# Patient Record
Sex: Female | Born: 1960
Health system: Southern US, Community
[De-identification: ages and names within clinical notes are randomized; demographics above are authoritative.]

## PROBLEM LIST (undated history)

## (undated) DIAGNOSIS — M8580 Other specified disorders of bone density and structure, unspecified site: Secondary | ICD-10-CM

## (undated) DIAGNOSIS — Z9221 Personal history of antineoplastic chemotherapy: Secondary | ICD-10-CM

## (undated) DIAGNOSIS — Z9889 Other specified postprocedural states: Secondary | ICD-10-CM

## (undated) DIAGNOSIS — C801 Malignant (primary) neoplasm, unspecified: Secondary | ICD-10-CM

## (undated) DIAGNOSIS — Z923 Personal history of irradiation: Secondary | ICD-10-CM

## (undated) DIAGNOSIS — C50919 Malignant neoplasm of unspecified site of unspecified female breast: Secondary | ICD-10-CM

## (undated) DIAGNOSIS — R112 Nausea with vomiting, unspecified: Secondary | ICD-10-CM

## (undated) DIAGNOSIS — E785 Hyperlipidemia, unspecified: Secondary | ICD-10-CM

## (undated) HISTORY — DX: Malignant (primary) neoplasm, unspecified: C80.1

## (undated) HISTORY — PX: TONSILLECTOMY: SUR1361

## (undated) HISTORY — DX: Hyperlipidemia, unspecified: E78.5

## (undated) HISTORY — PX: BREAST SURGERY: SHX581

---

## 2001-09-29 IMAGING — MG UNKNOWN MG STUDY
1 series · 8 of 8 positions shown · non-contrast
Comparison: none

REASON FOR EXAM: scr..hm[PHONE_NUMBER] cat 2

[Series 1708: R CC · right · 8 of 8 slices shown]
[im 1/8]
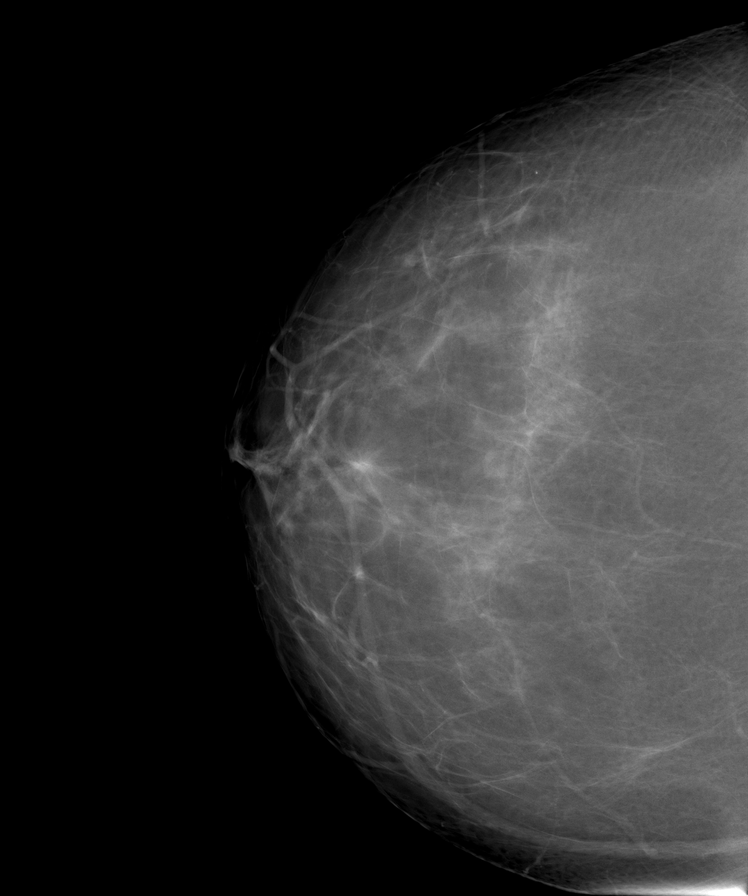
[im 2/8]
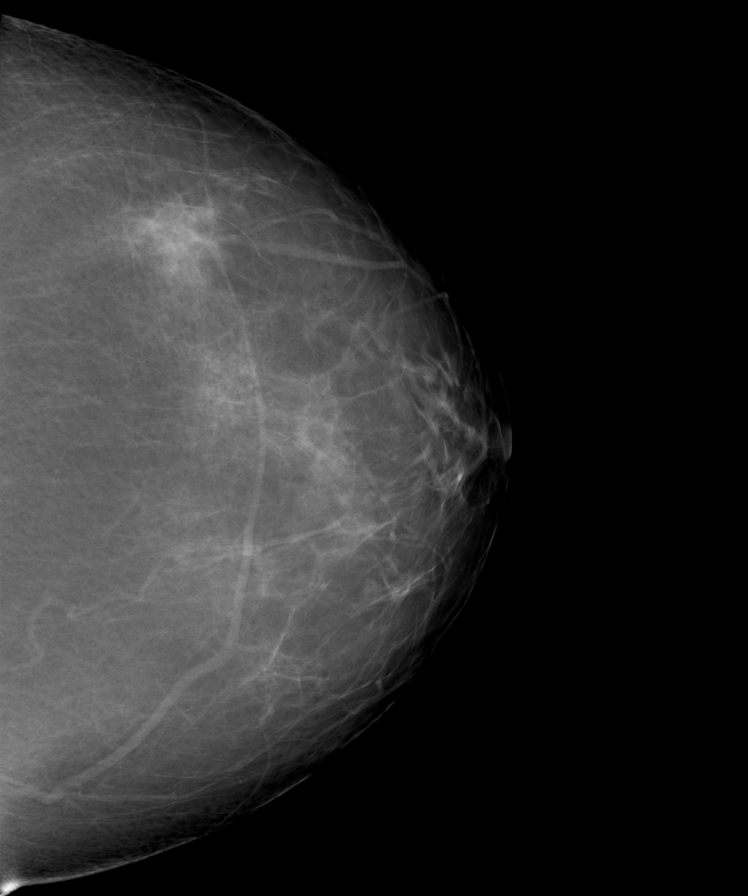
[im 3/8]
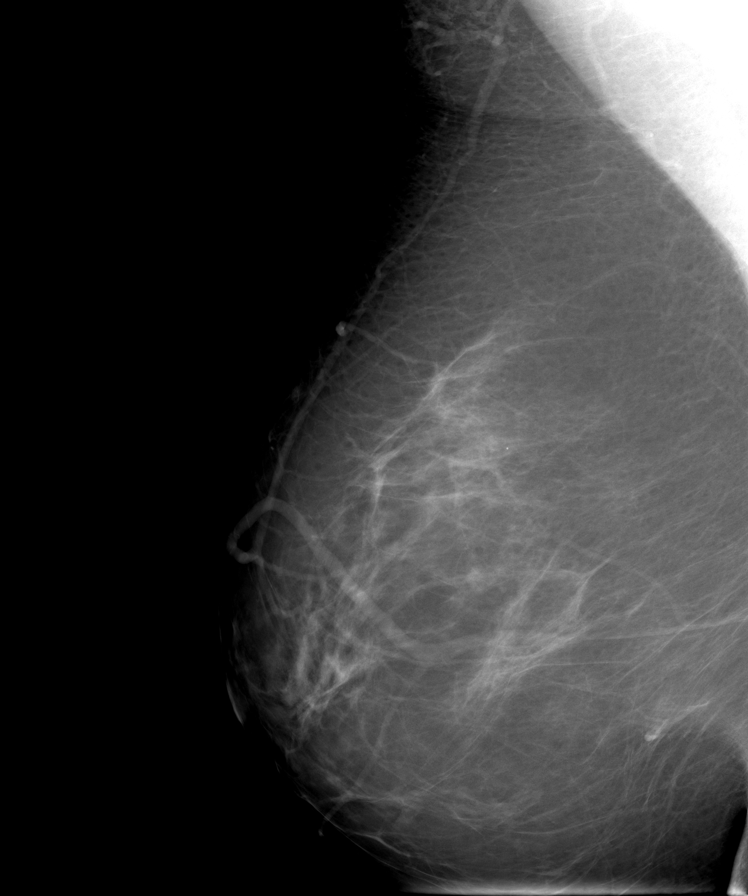
[im 4/8]
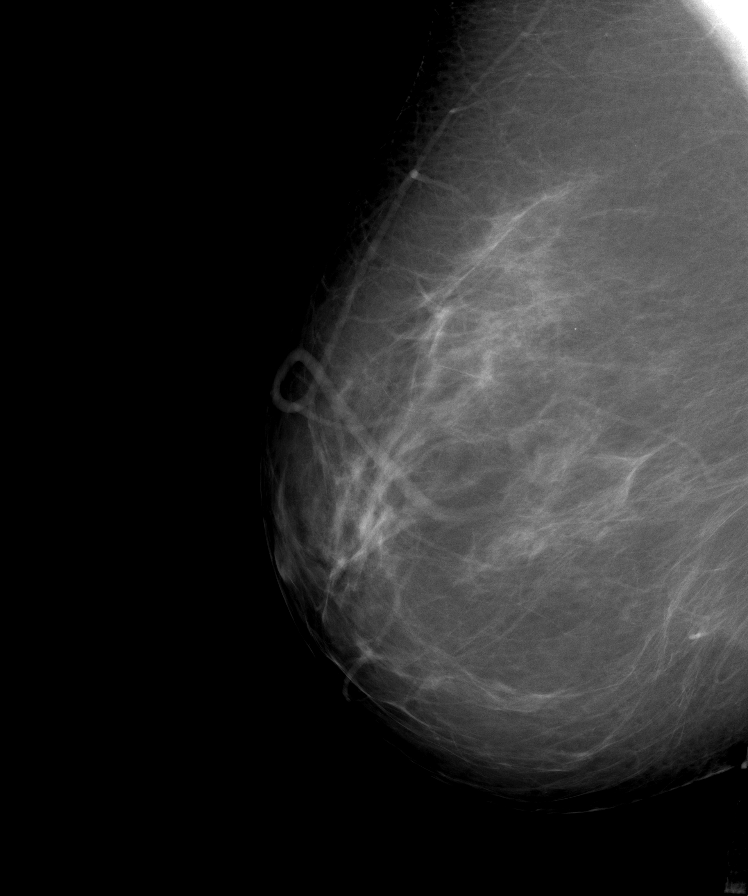
[im 5/8]
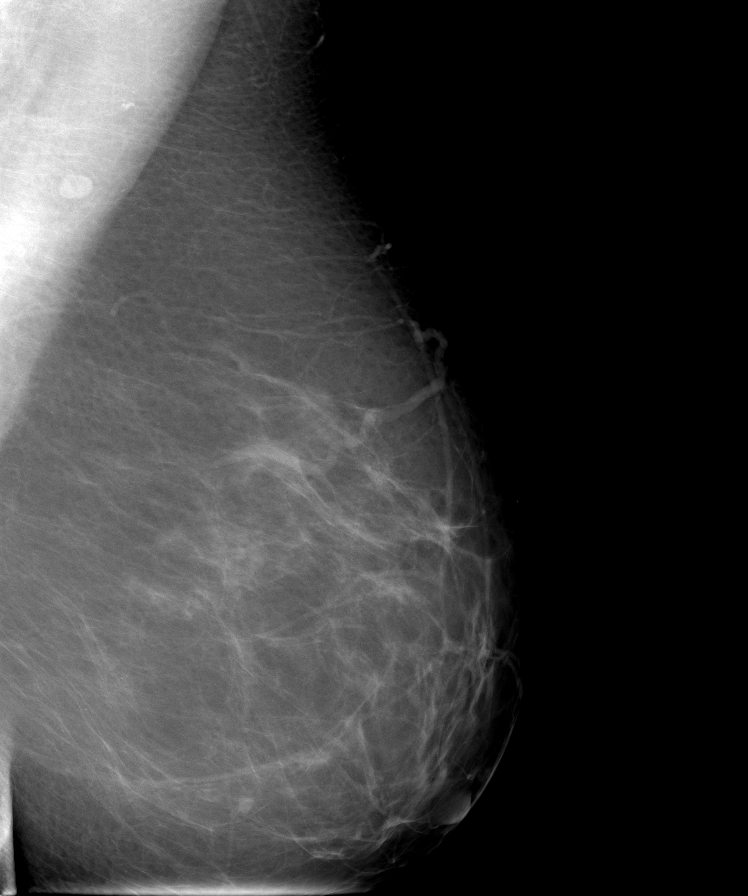
[im 6/8]
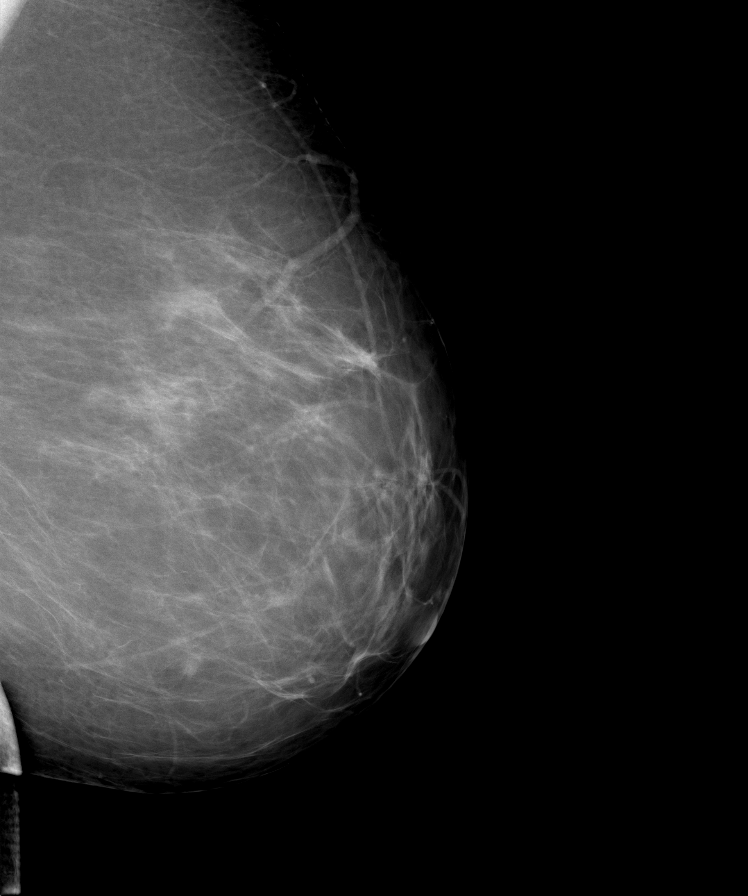
[im 7/8]
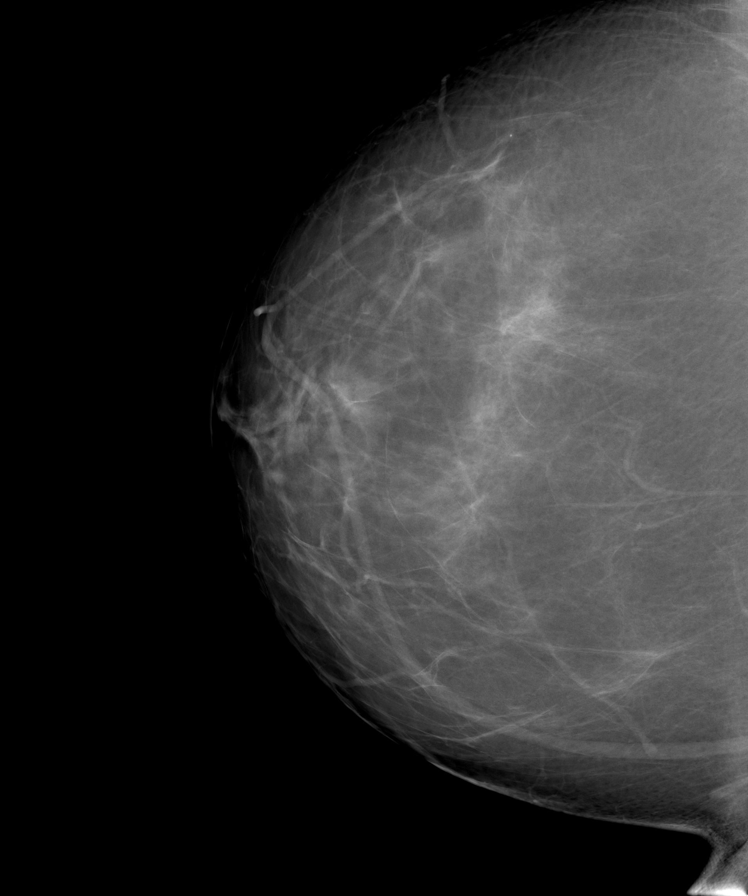
[im 8/8]
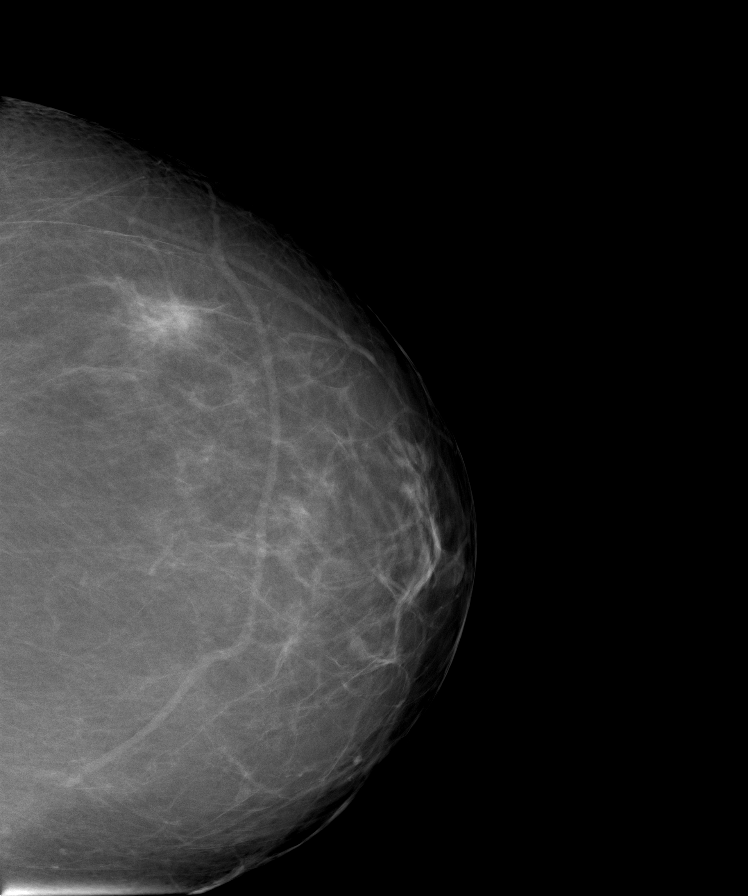

[8 of 8 positions shown; findings below may reference images not displayed]

Procedure: DIGITAL BILATERAL SCREENING MAMMOGRAPHY WITH CAD

 Comparison is made to study [DATE] and [DATE].

 The breasts exhibit a moderately dense parenchymal pattern. There is no
dominant mass. I see no malignant appearing grouping of microcalcification.
There is no area of new architectural distortion.
IMPRESSION: I see no finding suspicious for malignancy.
 BI-RADS: Category 2-Benign Findings.

 RECOMMENDATIONS: Please continue to encourage yearly mammographic follow
up.

 A NEGATIVE MAMMOGRAM REPORT DOES NOT PRECLUDE BIOPSY OR OTHER EVALUATION OF
A CLINICALLY PALPABLE OR OTHERWISE SUSPICIOUS MASS OR LESION. BREAST CANCER
MAY NOT BE DETECTED BY MAMMOGRAPHY IN UP TO 10% OF CASES.

## 2003-11-27 DIAGNOSIS — C50919 Malignant neoplasm of unspecified site of unspecified female breast: Secondary | ICD-10-CM

## 2003-11-27 HISTORY — PX: BREAST BIOPSY: SHX20

## 2003-11-27 HISTORY — DX: Malignant neoplasm of unspecified site of unspecified female breast: C50.919

## 2003-11-27 HISTORY — PX: BREAST LUMPECTOMY: SHX2

## 2003-11-27 HISTORY — PX: BREAST LUMPECTOMY WITH NEEDLE LOCALIZATION AND AXILLARY SENTINEL LYMPH NODE BX: SHX5760

## 2004-10-24 ENCOUNTER — Ambulatory Visit: Payer: Self-pay | Admitting: Internal Medicine

## 2004-10-30 ENCOUNTER — Inpatient Hospital Stay: Payer: Self-pay | Admitting: Surgery

## 2004-10-30 IMAGING — NM NM SENTINAL NODE INJECTION (BREAST) - NO REPORT
1 series · 5 of 5 positions shown · non-contrast
Comparison: none

REASON FOR EXAM: exc of RT breast mass with sentinel node bx  12-5  surgery
at 11am
COMMENTS:

[Series 0: breastsentinel · 1.9mm · 1.95mm/px · 5 of 5 frames shown]
[frame 1/5]
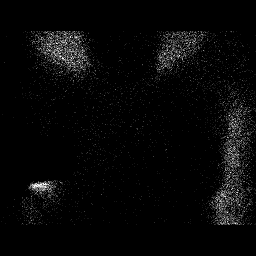
[frame 2/5]
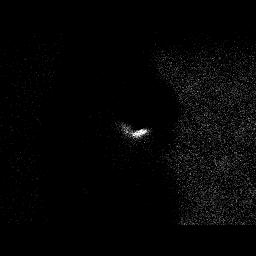
[frame 3/5]
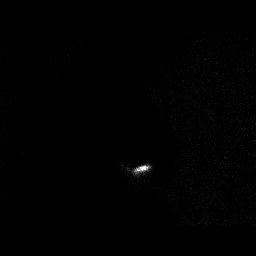
[frame 4/5]
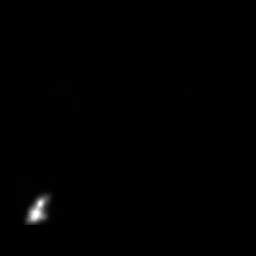
[frame 5/5]
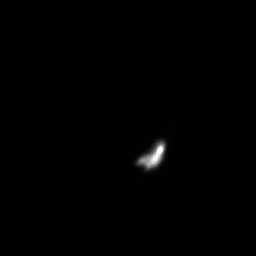

[5 of 5 positions shown; findings below may reference images not displayed]

PROCEDURE:     NM  - NM SENTINEL NODE  BREAST  - [DATE]  [DATE]

RESULT:     The patient has a palpable mass on the RIGHT in the upper outer
quadrant.  The anticipated procedure was discussed with Ms. DANELLE. The skin
was cleansed with an iodine solution in the upper outer periareolar region.
The subcutaneous tissues were infiltrated with approximately 2 ccs of 1%
lidocaine. Subsequently, a dose of 0.99 mCi of [ZL] sulfur colloid
was injected into the intradermal tissues.  Imaging was then carried [DATE] minutes. There is activity noted at the injection site.  No abnormal
activity is seen in the axillary region. There was slight increased activity
superiorly from the injection site in the periareolar region, but this did
not hold up on additional imaging.
IMPRESSION: 1)This is a sentinel node injection to evaluate the lymphatics on the RIGHT.
 No detectable abnormal activity was identified in the axilla.

## 2004-10-30 IMAGING — NM NM SENTINAL NODE INJECTION (BREAST) - NO REPORT
1 series · 6 of 6 positions shown · non-contrast
Comparison: none

REASON FOR EXAM: exc of RT breast mass with sentinel node bx  12-5  surgery
at 11am
COMMENTS:

[Series 0: sentinal node · 3.9mm · 3.90mm/px · 6 of 60 frames shown]
[frame 6/60  full-range]
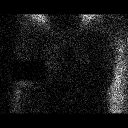
[frame 16/60  full-range]
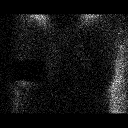
[frame 26/60  full-range]
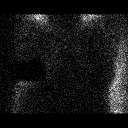
[frame 36/60  full-range]
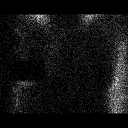
[frame 46/60  full-range]
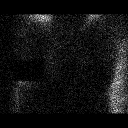
[frame 56/60  full-range]
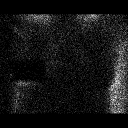

[6 of 6 positions shown; findings below may reference images not displayed]

PROCEDURE:     NM  - NM SENTINEL NODE  BREAST  - [DATE]  [DATE]

RESULT:     The patient has a palpable mass on the RIGHT in the upper outer
quadrant.  The anticipated procedure was discussed with Ms. DANELLE. The skin
was cleansed with an iodine solution in the upper outer periareolar region.
The subcutaneous tissues were infiltrated with approximately 2 ccs of 1%
lidocaine. Subsequently, a dose of 0.99 mCi of [ZL] sulfur colloid
was injected into the intradermal tissues.  Imaging was then carried [DATE] minutes. There is activity noted at the injection site.  No abnormal
activity is seen in the axillary region. There was slight increased activity
superiorly from the injection site in the periareolar region, but this did
not hold up on additional imaging.
IMPRESSION: 1)This is a sentinel node injection to evaluate the lymphatics on the RIGHT.
 No detectable abnormal activity was identified in the axilla.

## 2004-11-21 ENCOUNTER — Ambulatory Visit: Payer: Self-pay | Admitting: Internal Medicine

## 2004-11-23 ENCOUNTER — Other Ambulatory Visit: Payer: Self-pay

## 2004-11-26 ENCOUNTER — Ambulatory Visit: Payer: Self-pay | Admitting: Internal Medicine

## 2004-12-07 ENCOUNTER — Inpatient Hospital Stay: Payer: Self-pay | Admitting: Internal Medicine

## 2004-12-27 ENCOUNTER — Ambulatory Visit: Payer: Self-pay | Admitting: Internal Medicine

## 2005-01-24 ENCOUNTER — Ambulatory Visit: Payer: Self-pay | Admitting: Internal Medicine

## 2005-02-22 IMAGING — US US EXTREM LOW VENOUS BILAT
1 series · 17 of 24 positions shown · non-contrast
Comparison: none

REASON FOR EXAM: Bilateral leg swelling, evaluate DVT
COMMENTS:

[Series 1: us extrem low venous bilat · 17 of 54 slices shown]
[im 1/54]
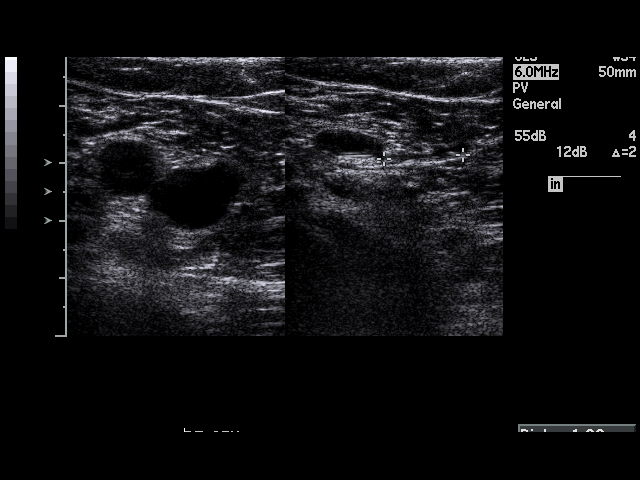
[im 5/54]
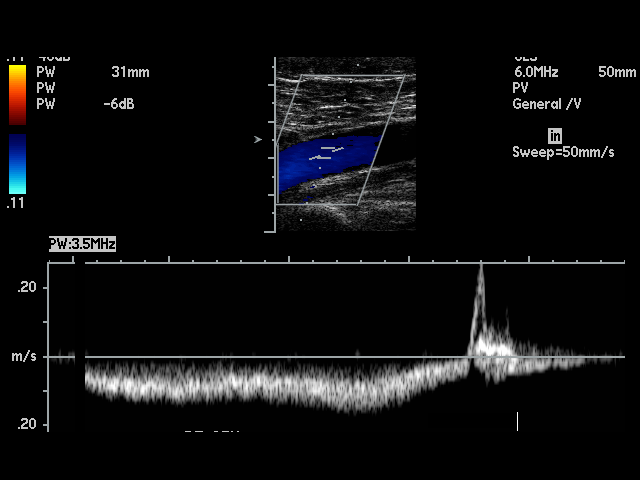
[im 7/54]
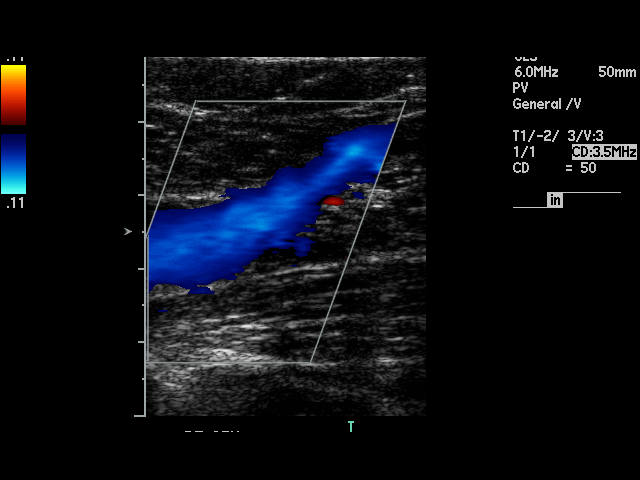
[im 10/54]
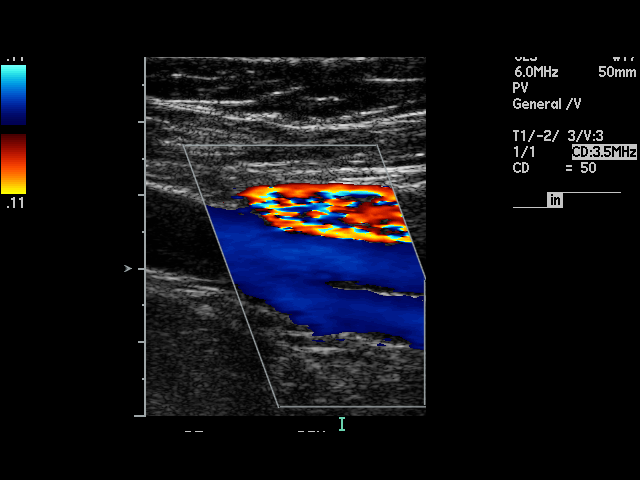
[im 14/54]
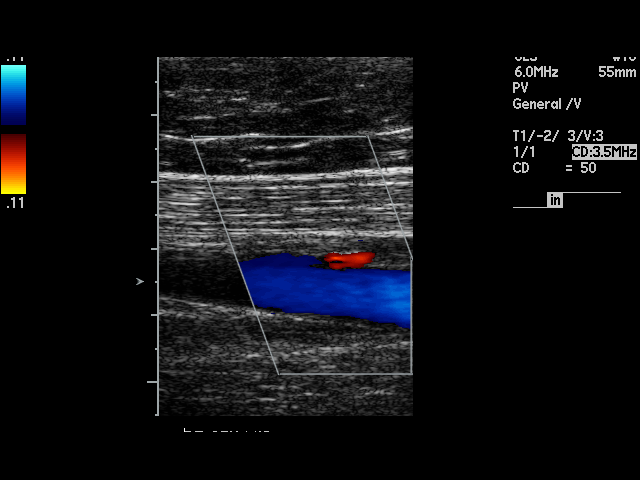
[im 17/54]
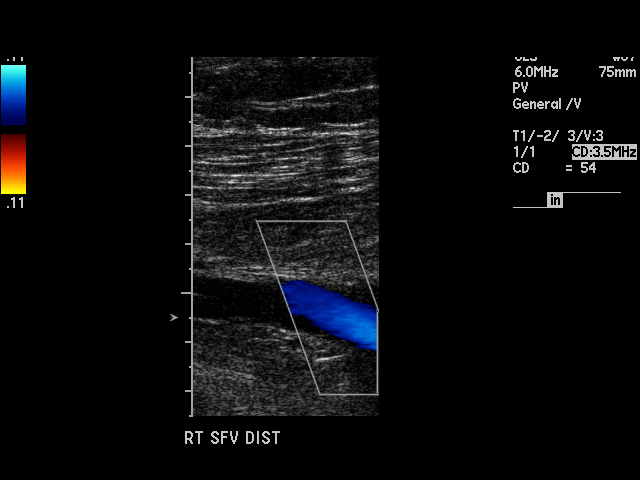
[im 21/54]
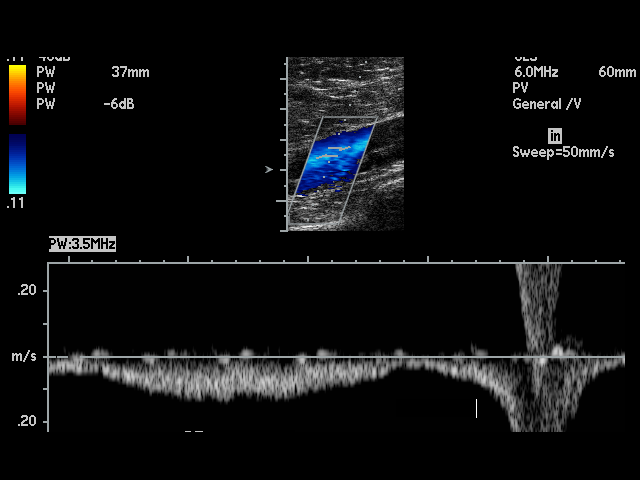
[im 24/54]
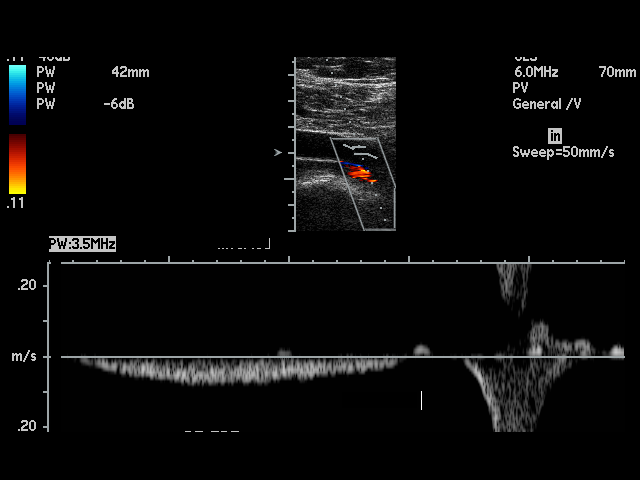
[im 28/54]
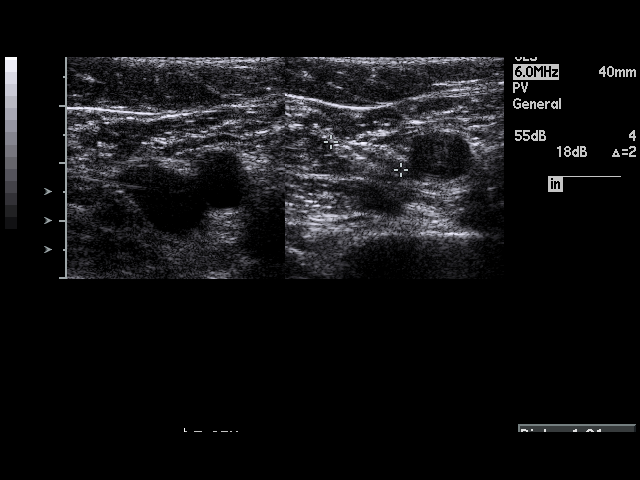
[im 30/54]
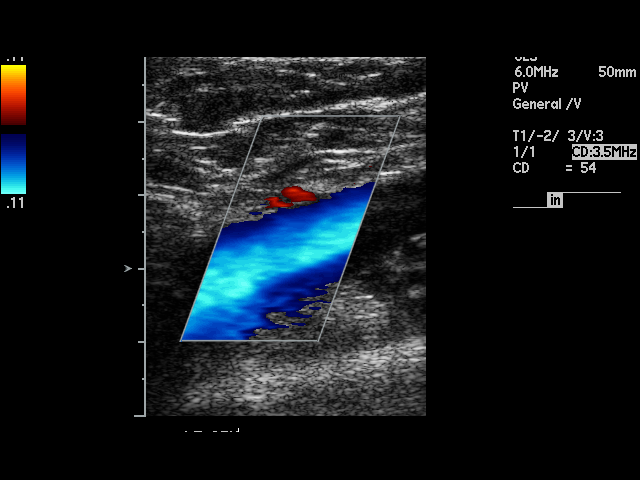
[im 33/54]
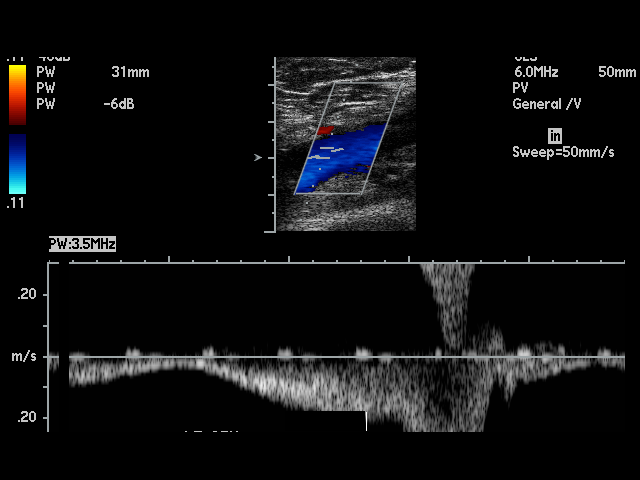
[im 37/54]
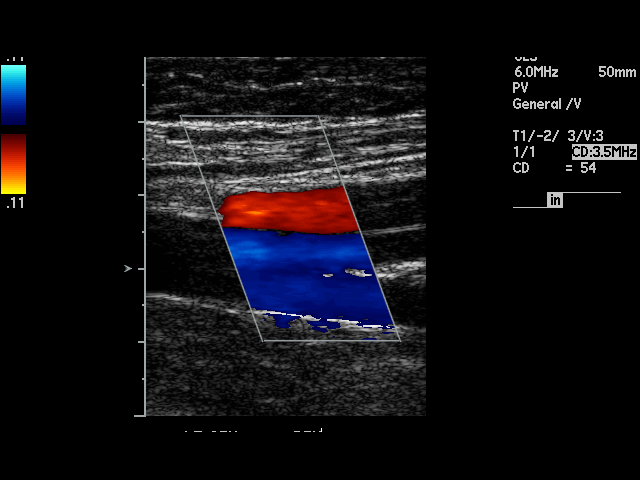
[im 40/54]
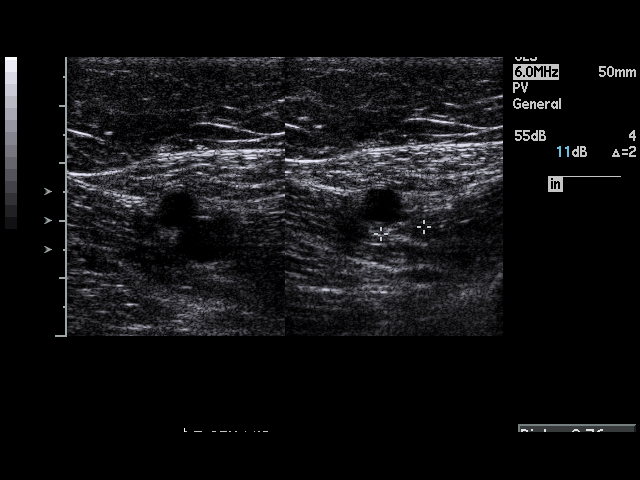
[im 44/54]
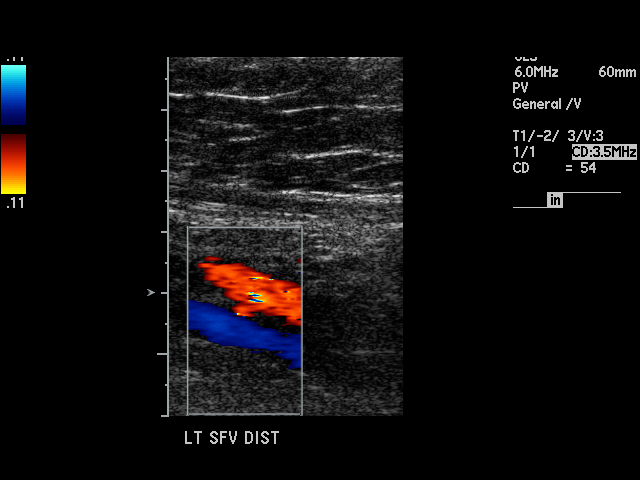
[im 47/54]
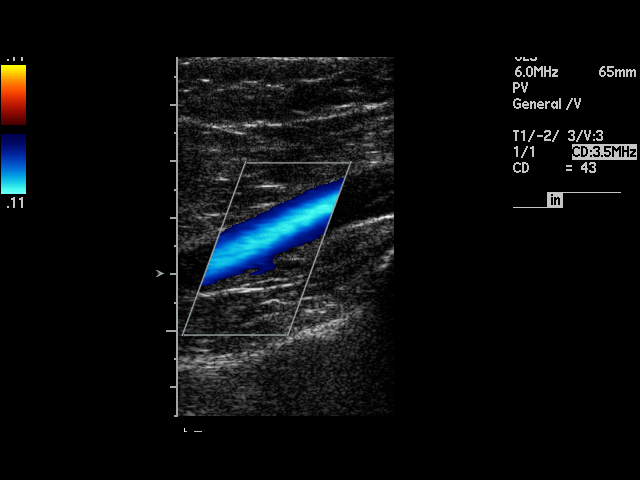
[im 49/54]
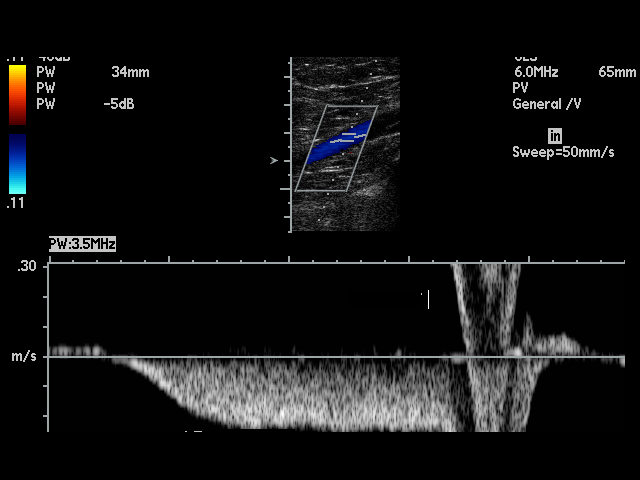
[im 54/54]
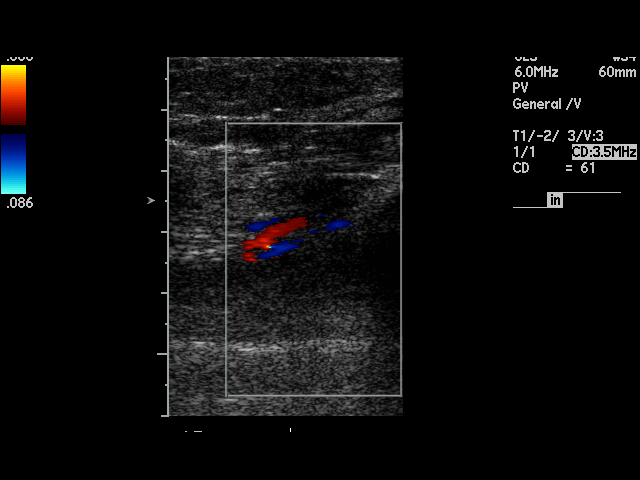

[17 of 24 positions shown; findings below may reference images not displayed]

PROCEDURE:     US  - US DOPPLER LOW EXTR BILATERAL  - [DATE]  [DATE]

RESULT:     Evaluation of the RIGHT and LEFT lower extremities demonstrates
no evidence of increased echogenicity and non-compressibility within the
deep venous structures of the RIGHT or LEFT lower extremity to suggest the
sequelae of a deep venous thrombus. There is appropriate response to
augmentation and Valsalva within the interrogated vessels of the RIGHT and
LEFT lower extremities. Appropriate color flow is demonstrated within the
interrogated vessels.
IMPRESSION: No evidence of a deep venous thrombus within the RIGHT or
LEFT lower extremity as described above.

## 2005-02-23 IMAGING — NM NM CARDIAC MUGA REST INJ 1 OF 2 - NRPT
7 series · 27 of 27 positions shown · non-contrast
Comparison: none

[Series 1: lao 45-gated (results) · 6.59mm/px · 6 of 24 frames shown]
[frame 3/24]
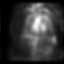
[frame 7/24]
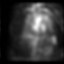
[frame 11/24]
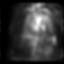
[frame 15/24]
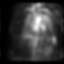
[frame 19/24]
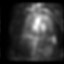
[frame 23/24]
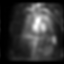

[Series 1: lao 45 · 6.59mm/px · 1 of 1 slices shown]
[im 1/1]
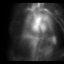

[Series 1: ant-gated · 6.59mm/px · 6 of 24 frames shown]
[frame 3/24]
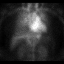
[frame 7/24]
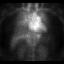
[frame 11/24]
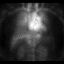
[frame 15/24]
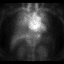
[frame 19/24]
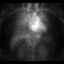
[frame 23/24]
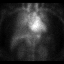

[Series 1: lao 70 · 6.59mm/px · 1 of 1 slices shown]
[im 1/1]
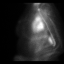

[Series 1: ant · 6.59mm/px · 1 of 1 slices shown]
[im 1/1]
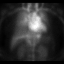

[Series 1: lao 45-gated · 6.59mm/px · 6 of 24 frames shown]
[frame 3/24]
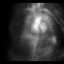
[frame 7/24]
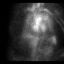
[frame 11/24]
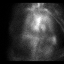
[frame 15/24]
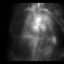
[frame 19/24]
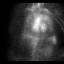
[frame 23/24]
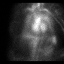

[Series 1: lao 70-gated · 6.59mm/px · 6 of 24 frames shown]
[frame 3/24]
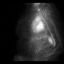
[frame 7/24]
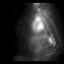
[frame 11/24]
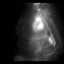
[frame 15/24]
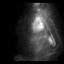
[frame 19/24]
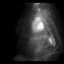
[frame 23/24]
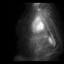

[27 of 27 positions shown; findings below may reference images not displayed]

IMAGES IMPORTED FROM THE SYNGO WORKFLOW SYSTEM
NO DICTATION FOR STUDY

## 2005-02-24 ENCOUNTER — Ambulatory Visit: Payer: Self-pay | Admitting: Internal Medicine

## 2005-03-26 ENCOUNTER — Ambulatory Visit: Payer: Self-pay | Admitting: Internal Medicine

## 2005-03-27 IMAGING — CT CT GUIDANCE PLACEMENT RAD THERAPY FIELDS
1 series · 16 of 32 positions shown, 20 images · non-contrast
Comparison: none

[Series 1: tx planning · axial · 0.98mm/px · z∈[-112,+168]mm · 16 of 123 slices shown, 20 images]
[im 8/123  soft-tissue]
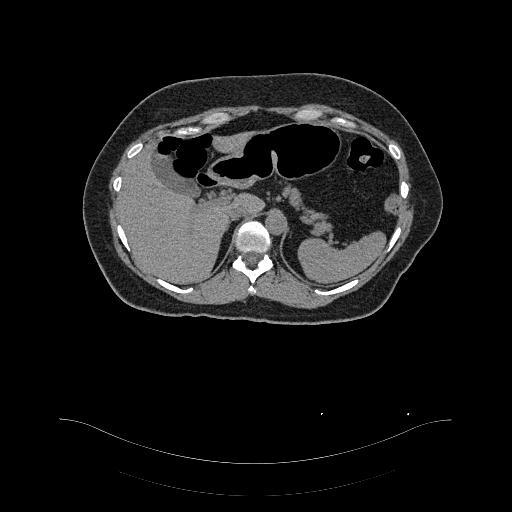
[im 8/123  bone]
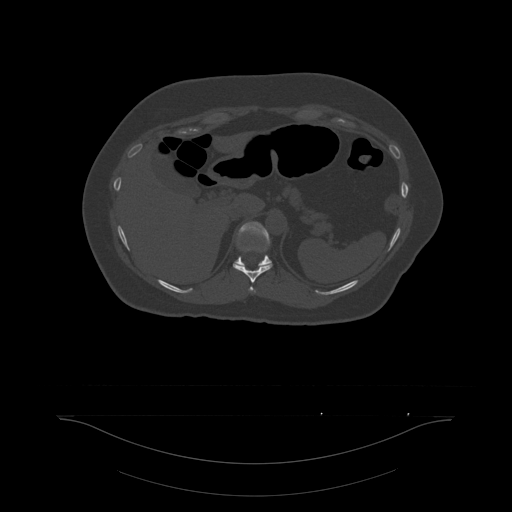
[im 16/123  soft-tissue]
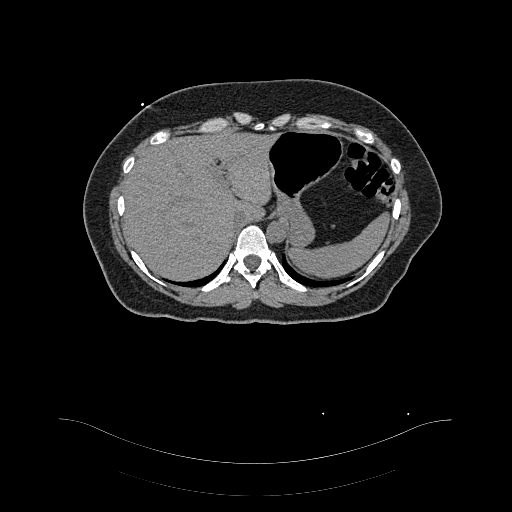
[im 24/123  soft-tissue]
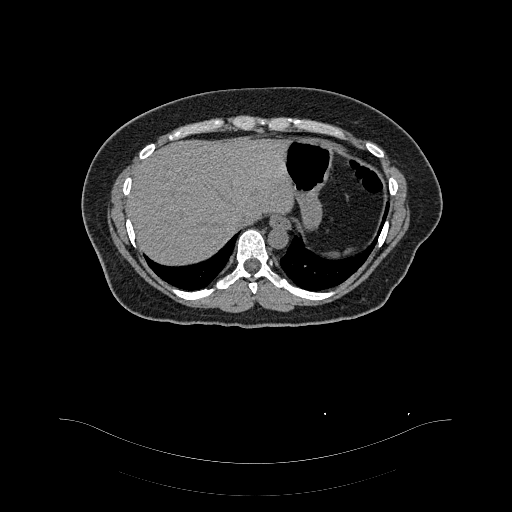
[im 32/123  soft-tissue]
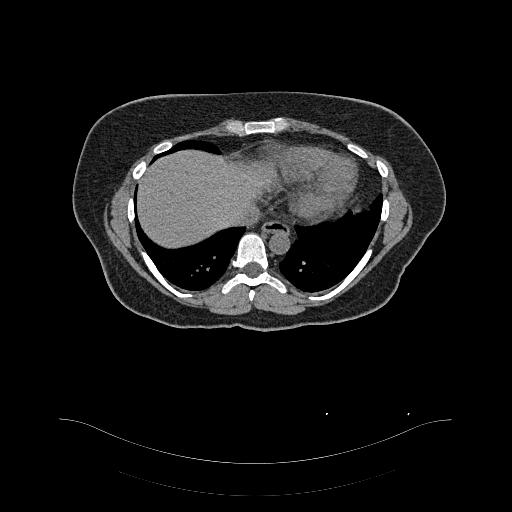
[im 40/123  soft-tissue]
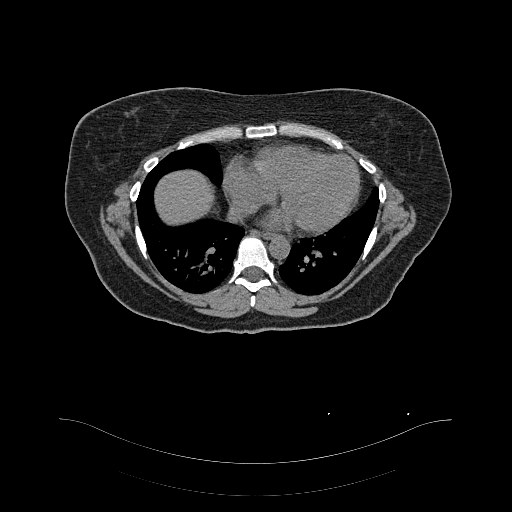
[im 48/123  soft-tissue]
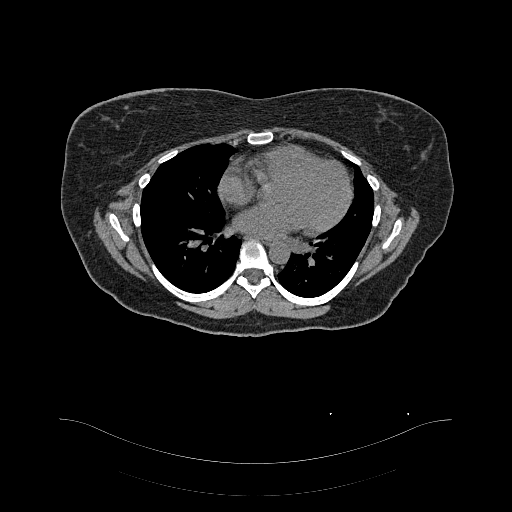
[im 56/123  soft-tissue]
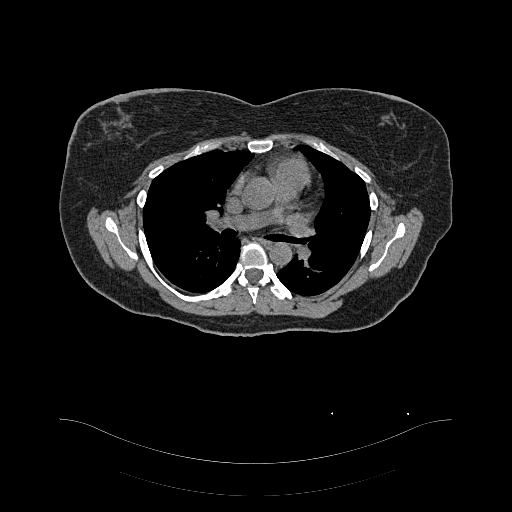
[im 67/123  soft-tissue]
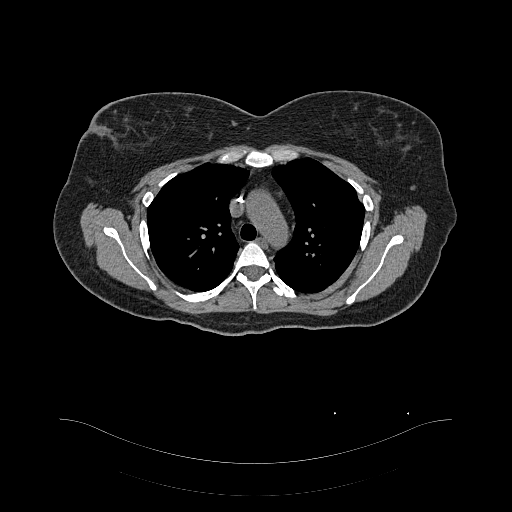
[im 75/123  soft-tissue]
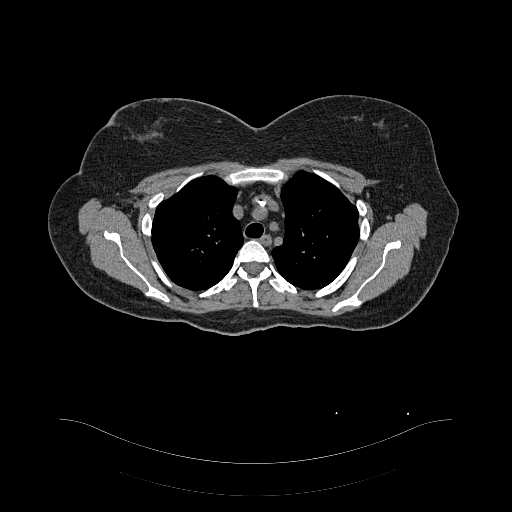
[im 75/123  bone]
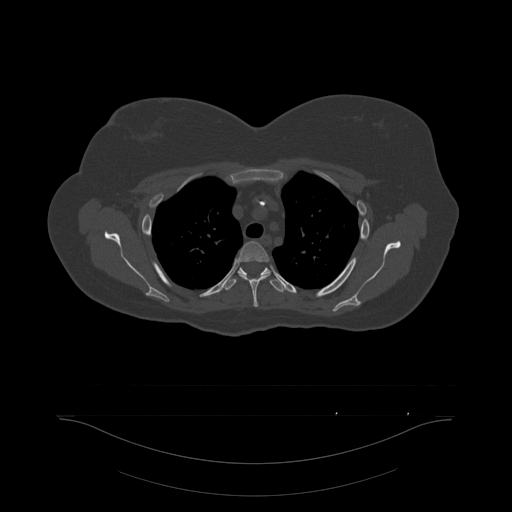
[im 83/123  soft-tissue]
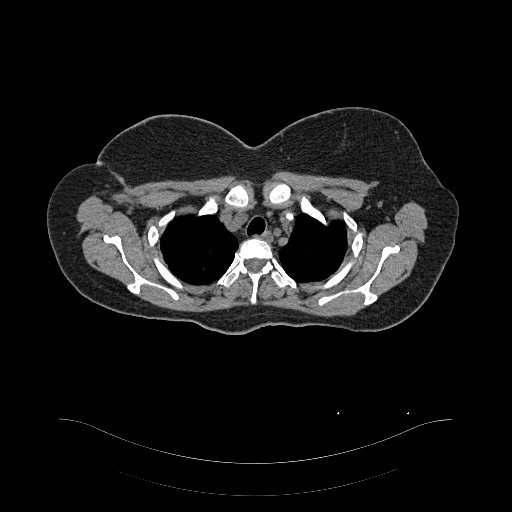
[im 91/123  soft-tissue]
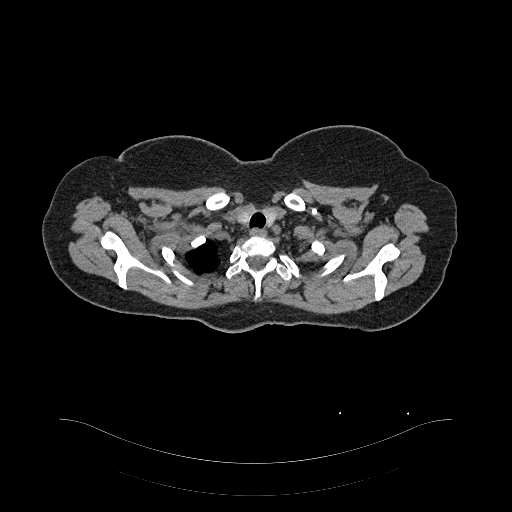
[im 99/123  soft-tissue]
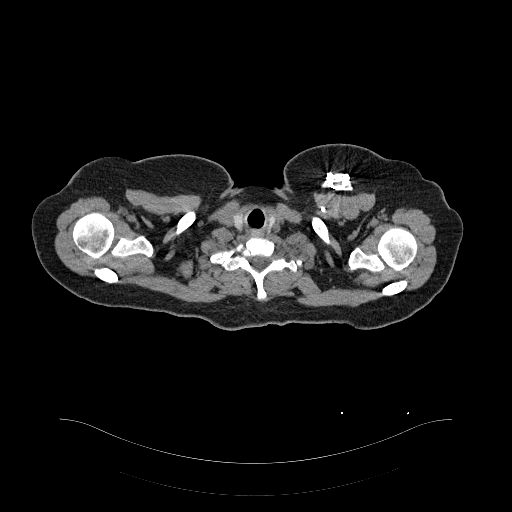
[im 107/123  soft-tissue]
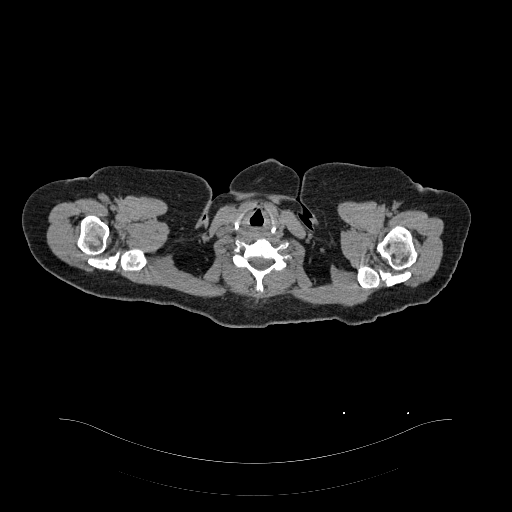
[im 107/123  lung]
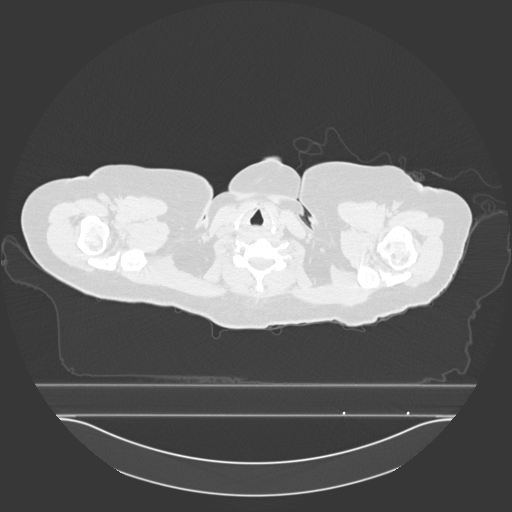
[im 111/123  lung]
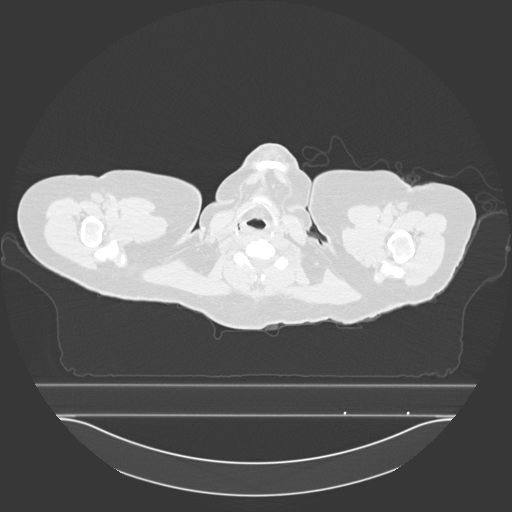
[im 115/123  soft-tissue]
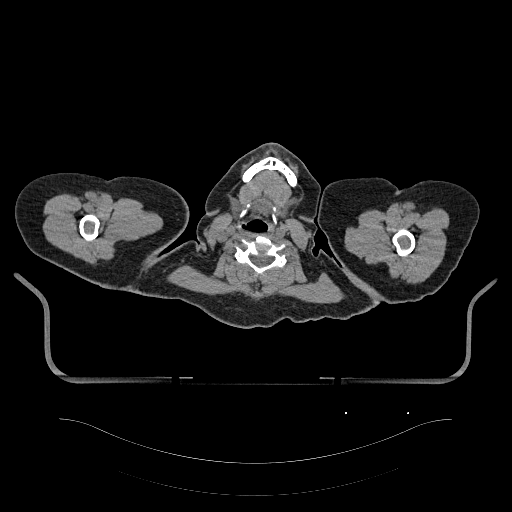
[im 115/123  lung]
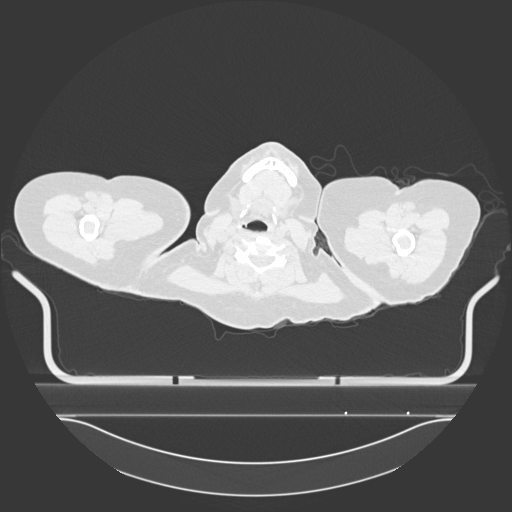
[im 119/123  lung]
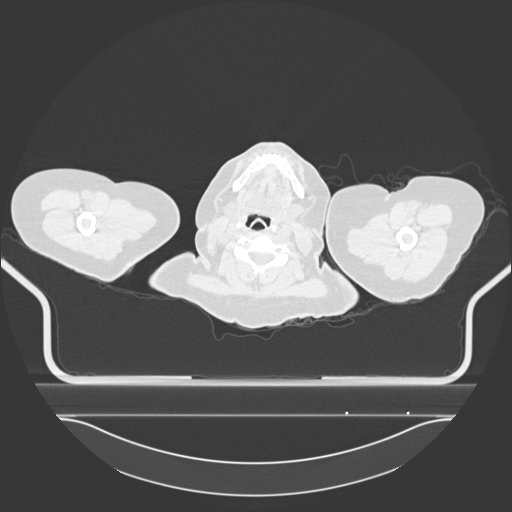

[16 of 32 positions shown; findings below may reference images not displayed]

IMAGES IMPORTED FROM THE SYNGO WORKFLOW SYSTEM
NO DICTATION FOR STUDY

## 2005-04-26 ENCOUNTER — Ambulatory Visit: Payer: Self-pay | Admitting: Internal Medicine

## 2005-05-26 ENCOUNTER — Ambulatory Visit: Payer: Self-pay | Admitting: Internal Medicine

## 2005-05-30 IMAGING — US US EXTREM UP VENOUS*R*
1 series · 18 of 24 positions shown · non-contrast
Comparison: none

REASON FOR EXAM: Breast CA 174.4  792.81 Swelling Rt Upper Ext
COMMENTS:

PROCEDURE:     US  - US DOPPLER UP EXTR RIGHT  - [DATE]  [DATE]
RESULT:     There is noted flow throughout the deep venous system in the
RIGHT upper extremity. The veins respond appropriately to augmentation. No
clot is identified.

[Series 1: us extrem up venous*right* · 18 of 27 slices shown]
[im 1/27]
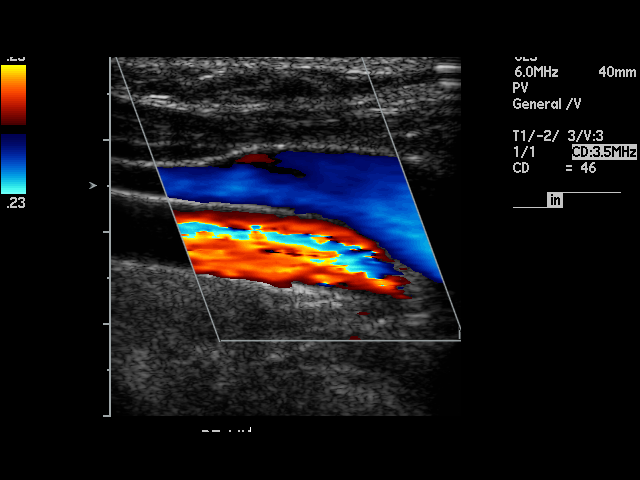
[im 3/27]
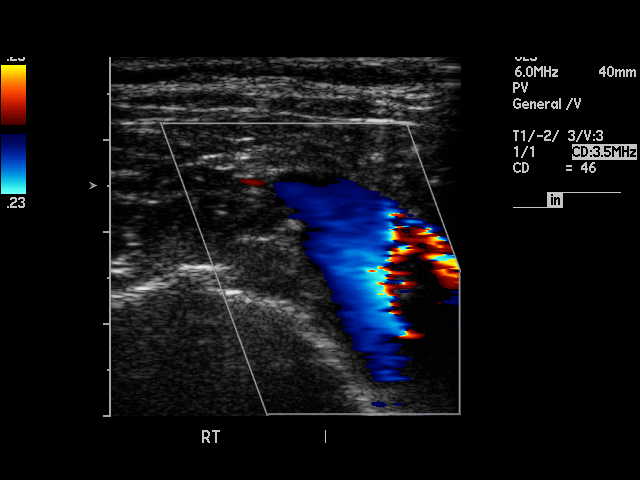
[im 4/27]
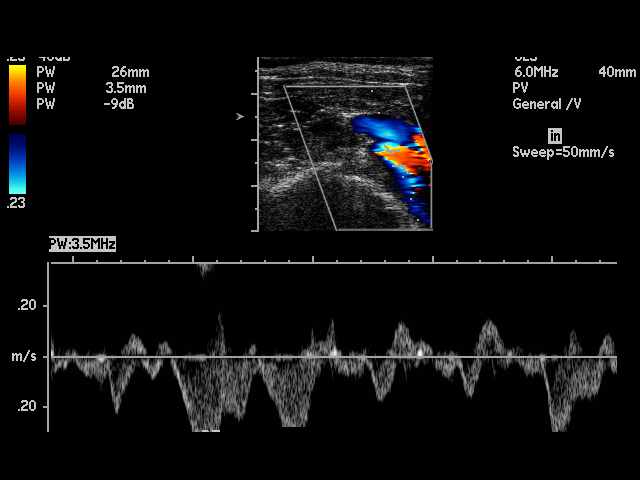
[im 5/27]
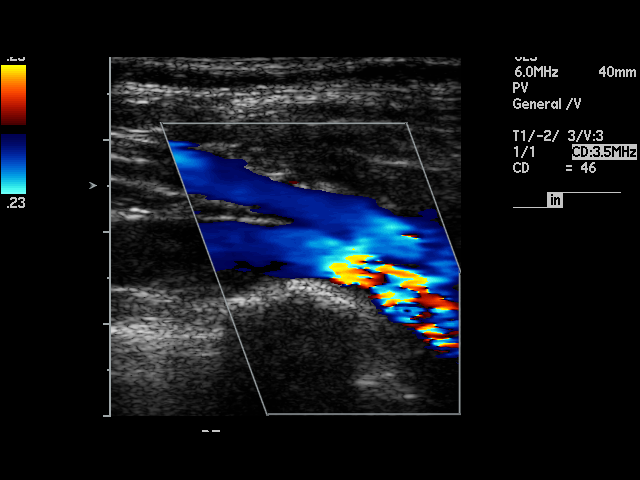
[im 7/27]
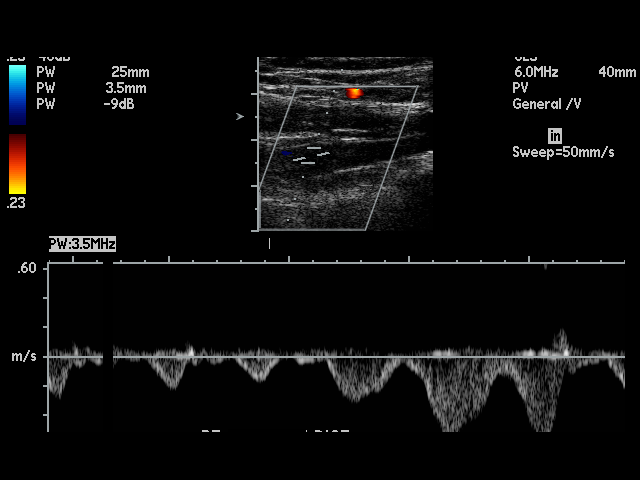
[im 8/27]
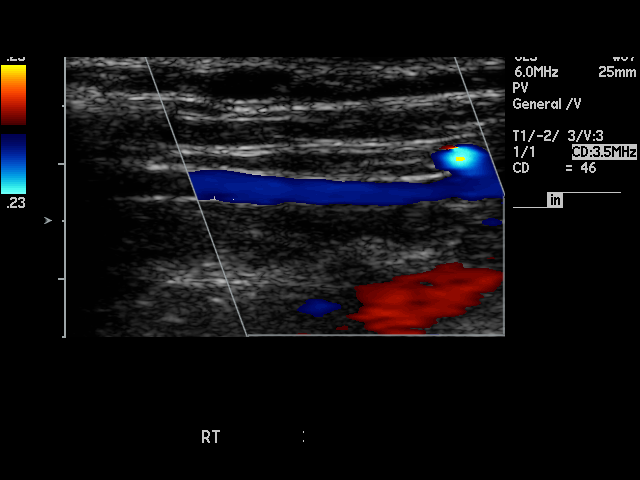
[im 10/27]
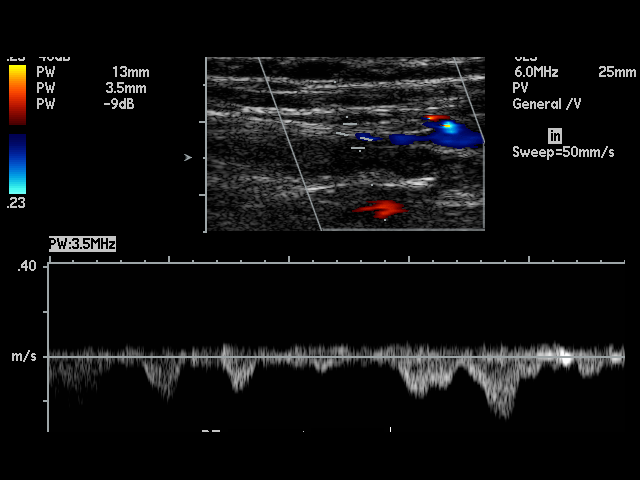
[im 12/27]
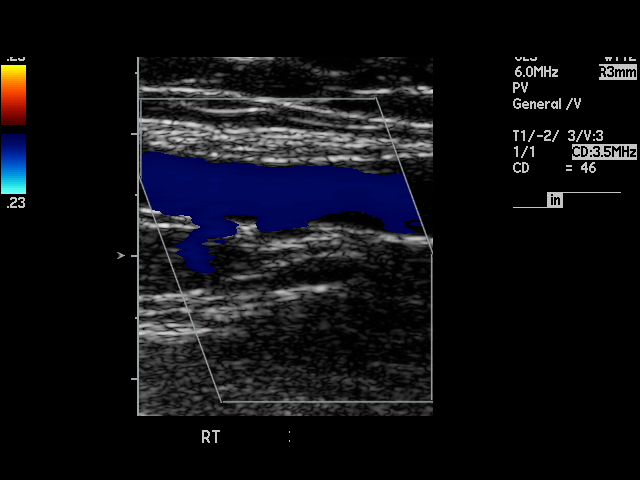
[im 13/27]
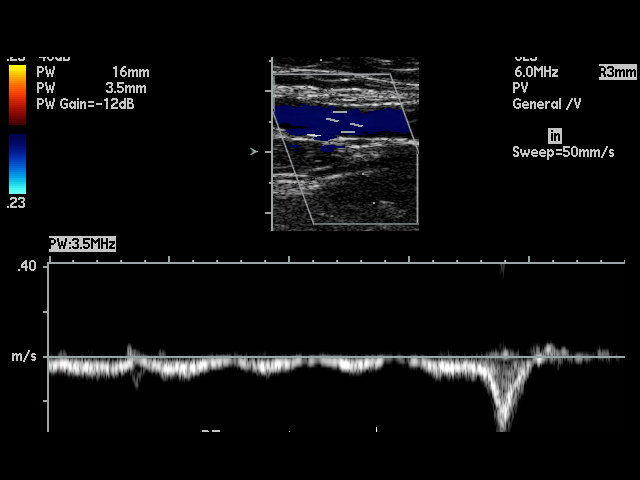
[im 14/27]
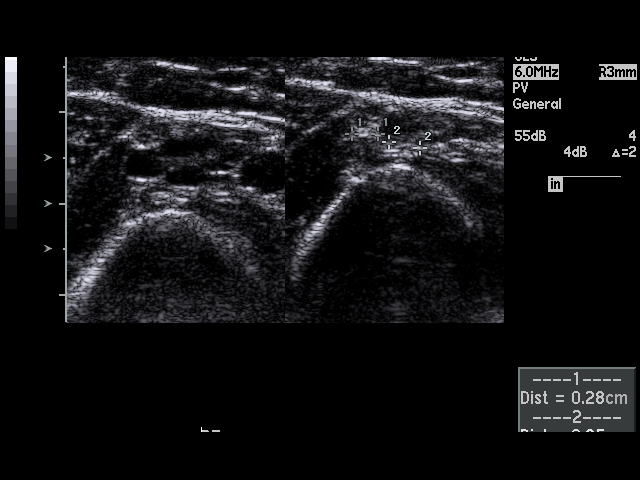
[im 16/27]
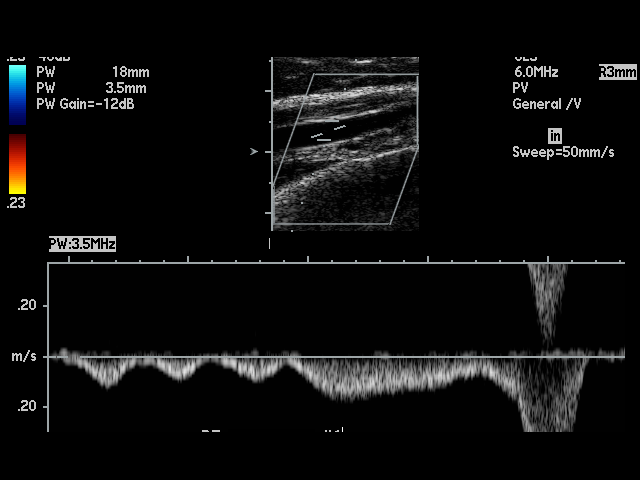
[im 17/27]
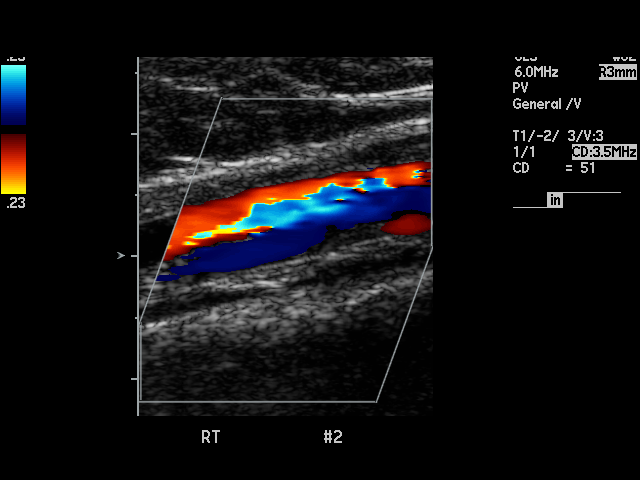
[im 19/27]
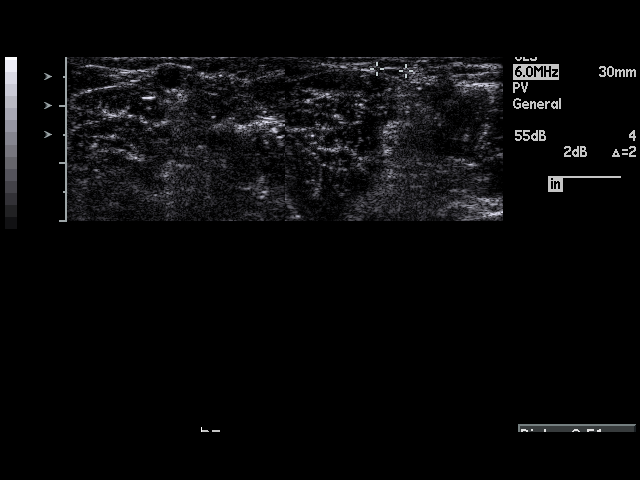
[im 21/27]
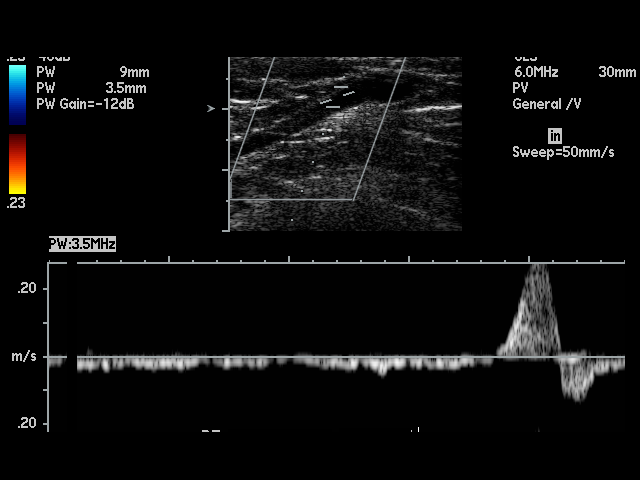
[im 22/27]
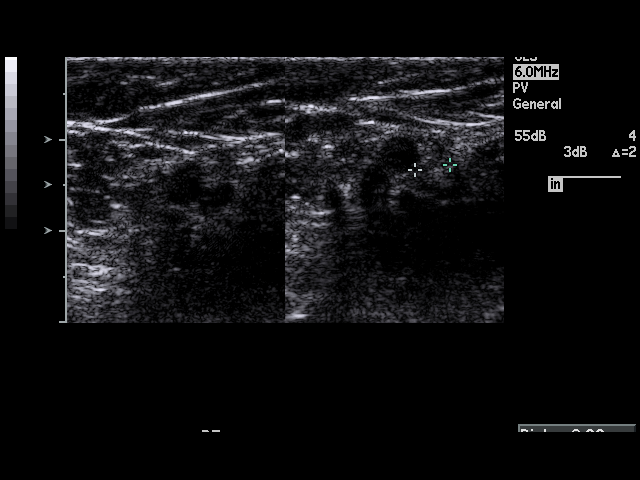
[im 23/27]
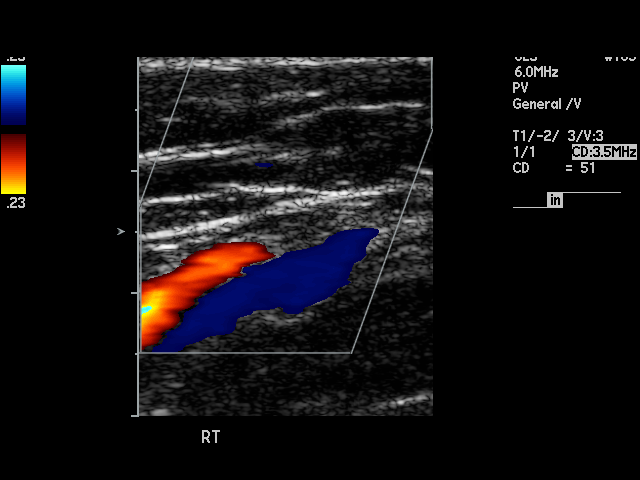
[im 25/27]
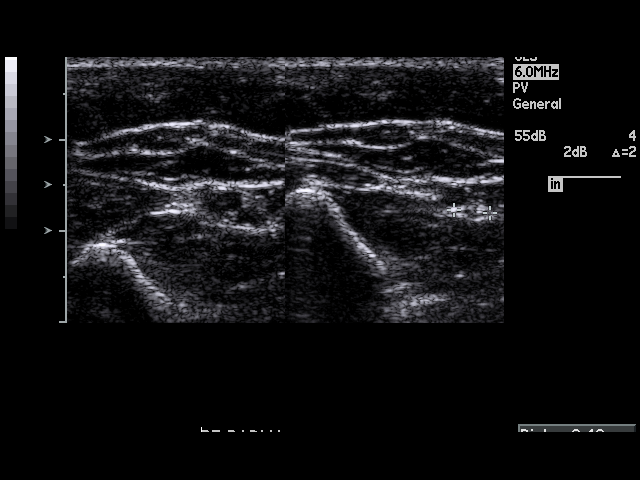
[im 27/27]
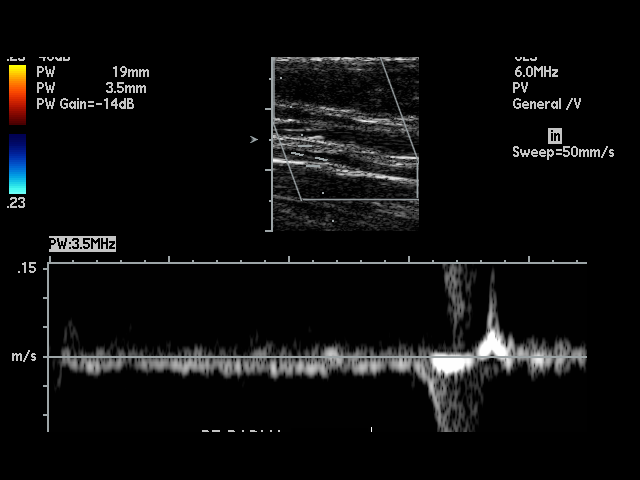

[18 of 24 positions shown; findings below may reference images not displayed]

IMPRESSION: 1)No evidence of deep venous thrombosis is identified in the RIGHT upper
extremity.

## 2005-06-26 ENCOUNTER — Ambulatory Visit: Payer: Self-pay | Admitting: Internal Medicine

## 2005-07-27 ENCOUNTER — Ambulatory Visit: Payer: Self-pay | Admitting: Internal Medicine

## 2005-08-03 ENCOUNTER — Ambulatory Visit: Payer: Self-pay | Admitting: Obstetrics and Gynecology

## 2005-08-26 ENCOUNTER — Ambulatory Visit: Payer: Self-pay | Admitting: Internal Medicine

## 2005-09-26 ENCOUNTER — Ambulatory Visit: Payer: Self-pay | Admitting: Internal Medicine

## 2005-10-26 ENCOUNTER — Ambulatory Visit: Payer: Self-pay | Admitting: Internal Medicine

## 2005-11-23 ENCOUNTER — Ambulatory Visit: Payer: Self-pay | Admitting: Surgery

## 2005-11-26 ENCOUNTER — Ambulatory Visit: Payer: Self-pay | Admitting: Internal Medicine

## 2005-11-30 ENCOUNTER — Ambulatory Visit: Payer: Self-pay | Admitting: Surgery

## 2005-12-17 IMAGING — NM NM  CARDIAC MUGA REST SCAN 2 0F 2
1 series · 6 of 6 positions shown · non-contrast
Comparison: none

REASON FOR EXAM: Breast CA, leg swelling, evaluate ejection fraction
COMMENTS:

[Series 0: rmuga (results) · 4.80mm/px · 6 of 24 frames shown]
[frame 3/24]
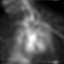
[frame 7/24]
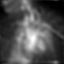
[frame 11/24]
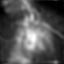
[frame 15/24]
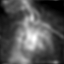
[frame 19/24]
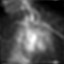
[frame 23/24]
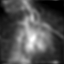

[6 of 6 positions shown; findings below may reference images not displayed]

PROCEDURE:     NM  - NM REST MUGA SCAN [DATE] OF [DATE]  - [DATE] [DATE] [DATE]  [DATE]

RESULT:     Following injection of 21.78 mCi of Technetium 99m Pertechnetate
and 3.0 ml of PYP, rest MUGA Scan was performed.

No dyskinetic myocardial segments are seen. The LEFT ventricular ejection
fraction measures 53.9% which is in the normal range.
IMPRESSION: Normal study. The LEFT ventricular ejection fraction
measures 53.9%.

## 2005-12-27 ENCOUNTER — Ambulatory Visit: Payer: Self-pay | Admitting: Internal Medicine

## 2006-01-28 ENCOUNTER — Ambulatory Visit: Payer: Self-pay | Admitting: Internal Medicine

## 2006-02-24 ENCOUNTER — Ambulatory Visit: Payer: Self-pay | Admitting: Internal Medicine

## 2006-03-19 ENCOUNTER — Ambulatory Visit: Payer: Self-pay | Admitting: General Practice

## 2006-03-26 ENCOUNTER — Ambulatory Visit: Payer: Self-pay | Admitting: General Practice

## 2006-03-26 ENCOUNTER — Ambulatory Visit: Payer: Self-pay | Admitting: Internal Medicine

## 2006-04-26 ENCOUNTER — Ambulatory Visit: Payer: Self-pay | Admitting: General Practice

## 2006-04-26 ENCOUNTER — Ambulatory Visit: Payer: Self-pay | Admitting: Internal Medicine

## 2006-05-31 ENCOUNTER — Ambulatory Visit: Payer: Self-pay | Admitting: Internal Medicine

## 2006-06-03 ENCOUNTER — Ambulatory Visit: Payer: Self-pay | Admitting: Surgery

## 2006-06-26 ENCOUNTER — Ambulatory Visit: Payer: Self-pay | Admitting: Internal Medicine

## 2006-08-06 ENCOUNTER — Ambulatory Visit: Payer: Self-pay | Admitting: Specialist

## 2006-09-27 ENCOUNTER — Ambulatory Visit: Payer: Self-pay | Admitting: Internal Medicine

## 2006-10-26 ENCOUNTER — Ambulatory Visit: Payer: Self-pay | Admitting: Internal Medicine

## 2006-11-27 ENCOUNTER — Ambulatory Visit: Payer: Self-pay | Admitting: Surgery

## 2006-12-19 ENCOUNTER — Ambulatory Visit: Payer: Self-pay | Admitting: Internal Medicine

## 2006-12-27 ENCOUNTER — Ambulatory Visit: Payer: Self-pay | Admitting: Internal Medicine

## 2007-01-25 ENCOUNTER — Ambulatory Visit: Payer: Self-pay | Admitting: Internal Medicine

## 2007-01-29 ENCOUNTER — Ambulatory Visit: Payer: Self-pay | Admitting: Internal Medicine

## 2007-02-25 ENCOUNTER — Ambulatory Visit: Payer: Self-pay | Admitting: Internal Medicine

## 2007-07-28 ENCOUNTER — Ambulatory Visit: Payer: Self-pay | Admitting: Internal Medicine

## 2007-07-30 ENCOUNTER — Ambulatory Visit: Payer: Self-pay | Admitting: Internal Medicine

## 2007-08-27 ENCOUNTER — Ambulatory Visit: Payer: Self-pay | Admitting: Internal Medicine

## 2007-12-01 ENCOUNTER — Ambulatory Visit: Payer: Self-pay | Admitting: Surgery

## 2008-01-12 ENCOUNTER — Ambulatory Visit: Payer: Self-pay | Admitting: Specialist

## 2008-01-25 ENCOUNTER — Ambulatory Visit: Payer: Self-pay | Admitting: Internal Medicine

## 2008-02-25 ENCOUNTER — Ambulatory Visit: Payer: Self-pay | Admitting: Internal Medicine

## 2008-07-27 ENCOUNTER — Ambulatory Visit: Payer: Self-pay | Admitting: Internal Medicine

## 2008-07-28 ENCOUNTER — Ambulatory Visit: Payer: Self-pay | Admitting: Internal Medicine

## 2008-08-26 ENCOUNTER — Ambulatory Visit: Payer: Self-pay | Admitting: Internal Medicine

## 2008-12-02 ENCOUNTER — Ambulatory Visit: Payer: Self-pay | Admitting: Surgery

## 2009-01-06 ENCOUNTER — Ambulatory Visit: Payer: Self-pay | Admitting: Urology

## 2009-01-06 IMAGING — CT CT ABDOMEN AND PELVIS WITHOUT AND WITH CONTRAST
2 of 4 series · 14 of 32 positions shown, 19 images · non-contrast
Comparison: No comparison

REASON FOR EXAM: hematuria
COMMENTS:

PROCEDURE:     CT  - CT ABDOMEN / PELVIS  W/WO  - [DATE] [DATE]
RESULT:     History: Hematuria
TECHNIQUE: Multiple axial images of the abdomen and pelvis were performed
from the lung bases to the pubic symphysis, without p.o. contrast and prior
to and following 100 ml of [D5] intravenous contrast in the
nephrographic and excretory renal phases.

[Series 2: wo · axial · 0.73mm/px · z∈[+42,+378]mm · 8 of 87 slices shown, 13 images]
[im 10/87  soft-tissue]
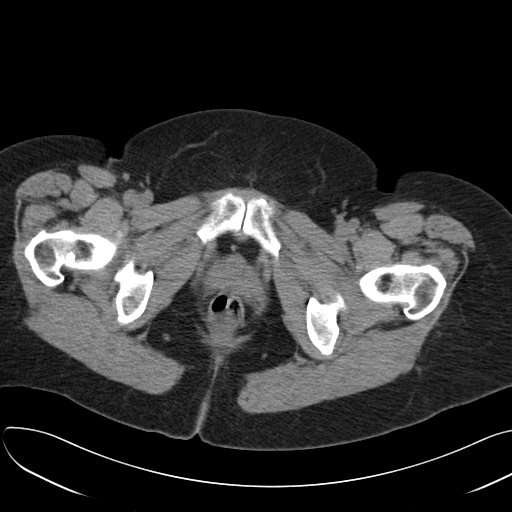
[im 10/87  bone]
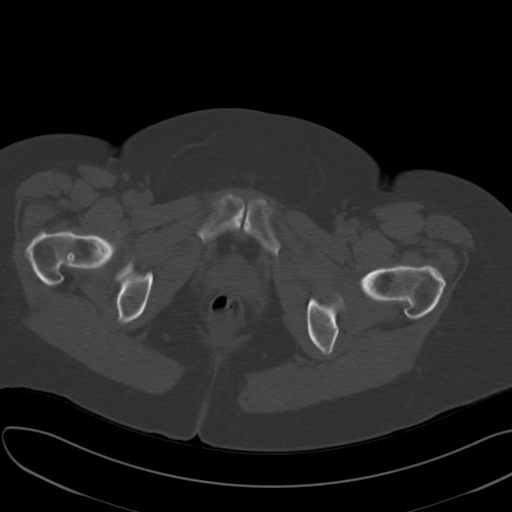
[im 20/87  soft-tissue]
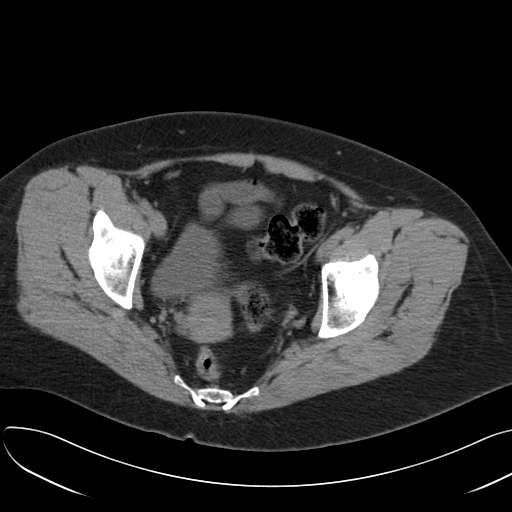
[im 29/87  soft-tissue]
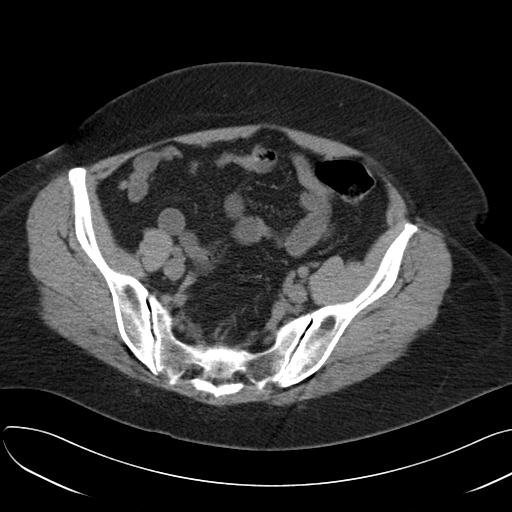
[im 39/87  soft-tissue]
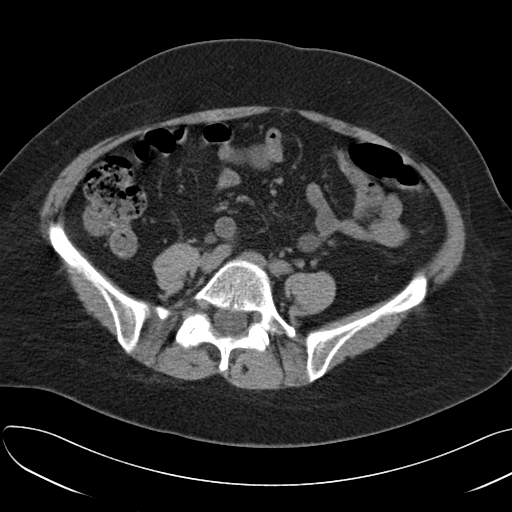
[im 48/87  soft-tissue]
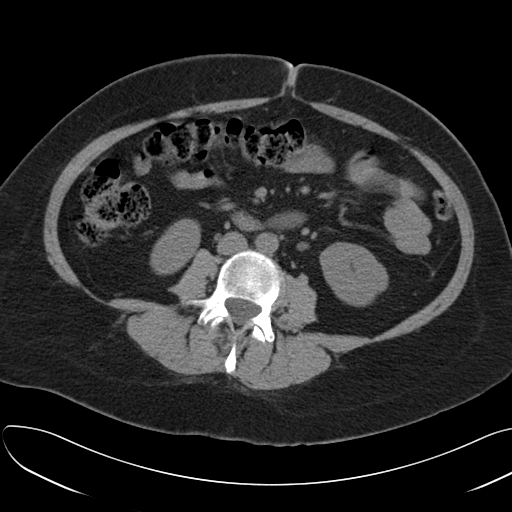
[im 48/87  lung]
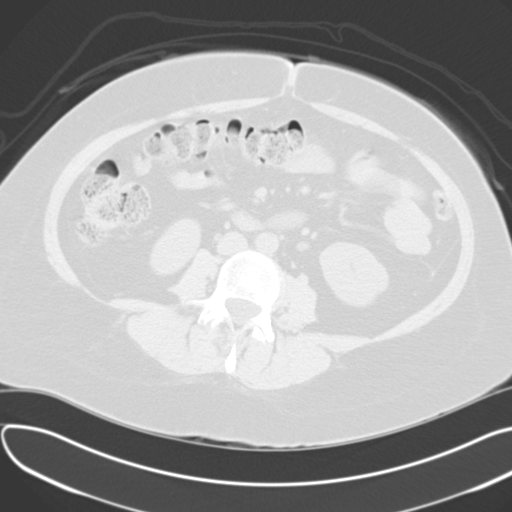
[im 58/87  soft-tissue]
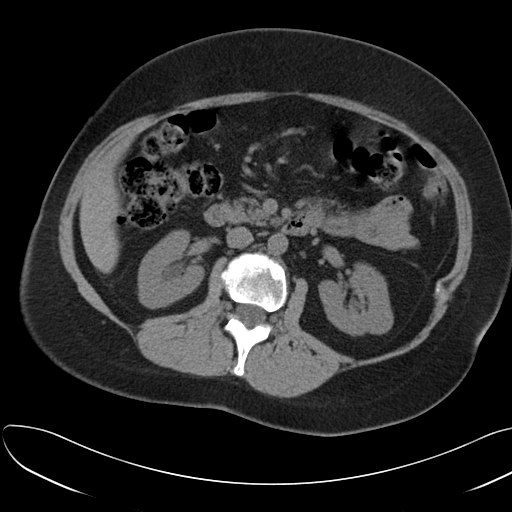
[im 58/87  lung]
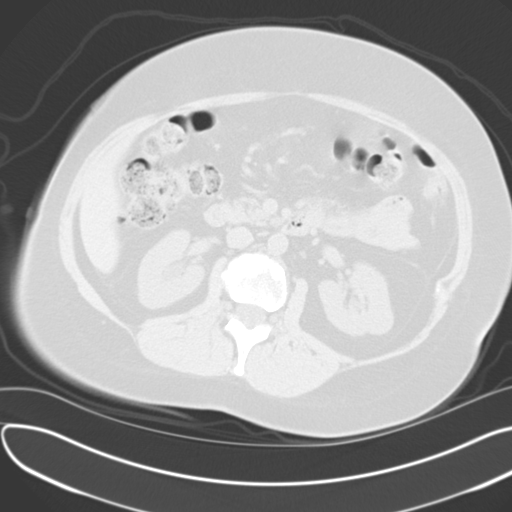
[im 67/87  soft-tissue]
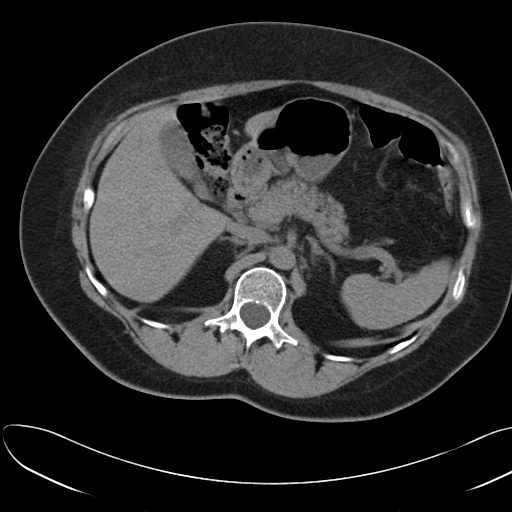
[im 67/87  lung]
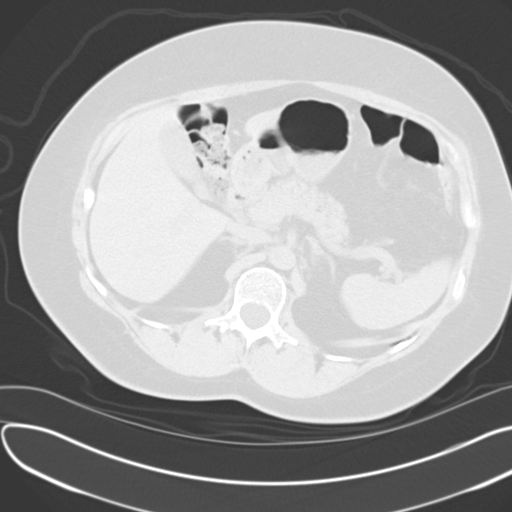
[im 77/87  soft-tissue]
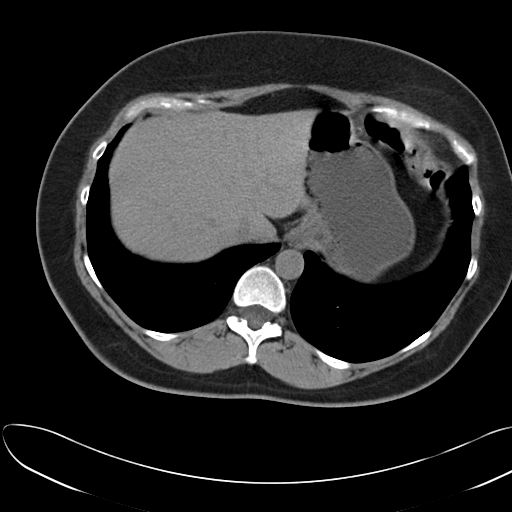
[im 77/87  lung]
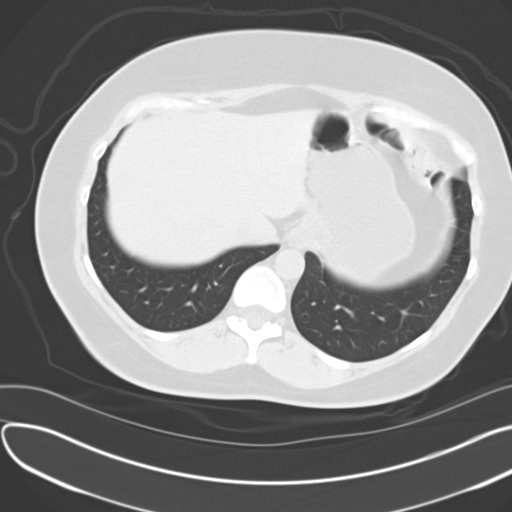

[Series 3: with · axial · 0.73mm/px · z∈[+42,+282]mm · 6 of 87 slices shown]
[im 10/87  soft-tissue]
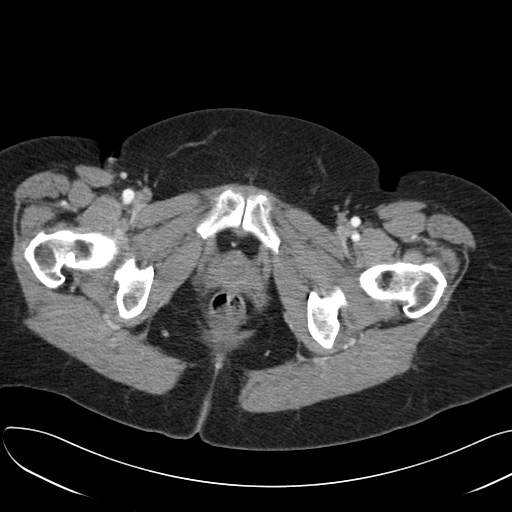
[im 20/87  soft-tissue]
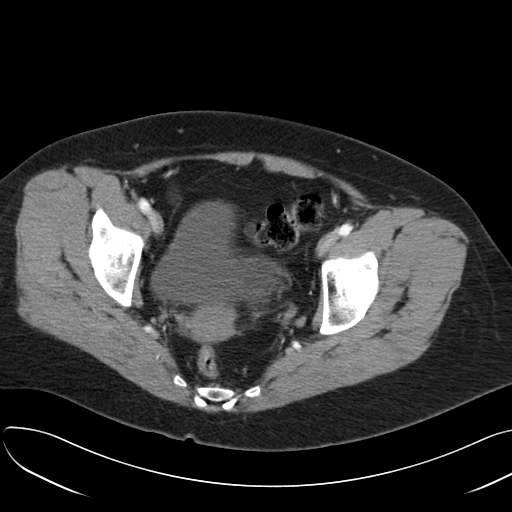
[im 29/87  soft-tissue]
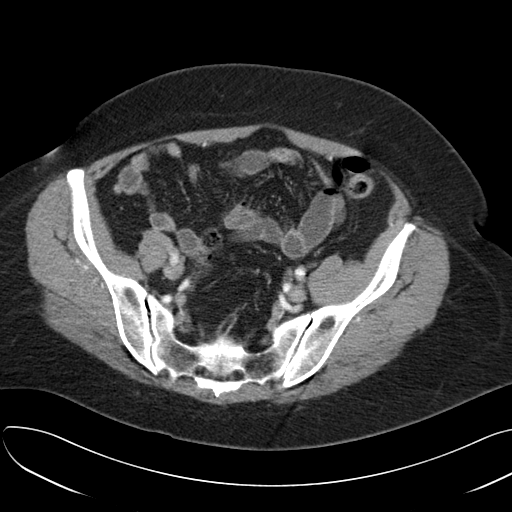
[im 39/87  soft-tissue]
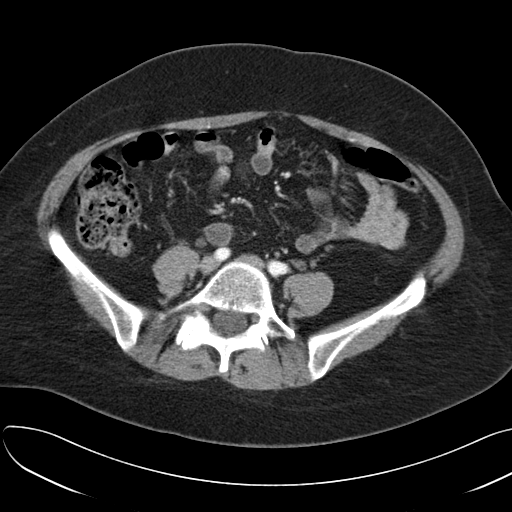
[im 48/87  soft-tissue]
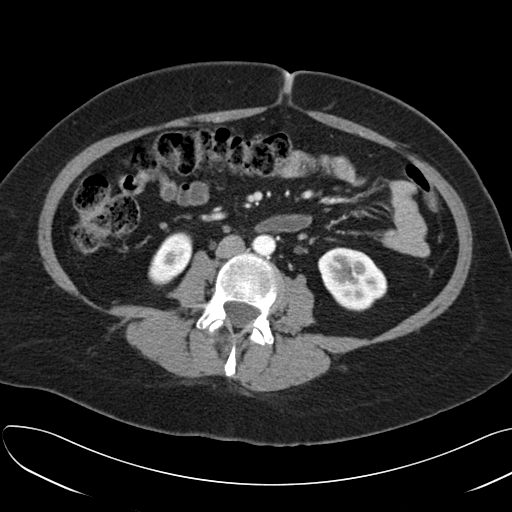
[im 58/87  soft-tissue]
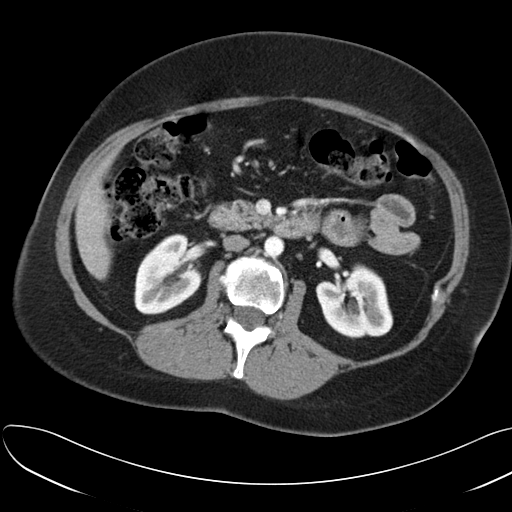

[14 of 32 positions shown; findings below may reference images not displayed]

FINDINGS: The lung bases are clear. There is no pneumothorax. The heart size is
normal.

There are no renal, ureteral, or bladder calculi. There is no obstructive
uropathy. There is no cystic or solid renal mass. Bilateral kidneys enhance
normally and symmetrically. There is normal symmetric bilateral excretion of
contrast without evidence of filling defects. The bladder is unremarkable.

The liver demonstrates no focal abnormality. There is no intrahepatic or
extrahepatic biliary ductal dilatation. The gallbladder is unremarkable. The
spleen demonstrates no focal abnormality. The  adrenal glands, and pancreas
are normal.

The unopacified stomach, duodenum, small intestine, and large intestine
demonstrate no gross abnormality, but evaluation is limited secondary to
lack of enteric contrast. A normal caliber appendix is visualized in the
right lower quadrant without periappendiceal inflammatory changes. There is
no pneumoperitoneum, pneumatosis, or portal venous gas. There is no
abdominal or pelvic free fluid. There is no lymphadenopathy.

The abdominal aorta is normal in caliber.

There is degenerative disc disease of the lumbar spine most significant at
L2-L3.
IMPRESSION: 1. Normal bilateral kidneys and renal collecting system.

## 2009-03-26 ENCOUNTER — Ambulatory Visit: Payer: Self-pay | Admitting: Internal Medicine

## 2009-04-05 ENCOUNTER — Ambulatory Visit: Payer: Self-pay | Admitting: Urology

## 2009-04-06 ENCOUNTER — Ambulatory Visit: Payer: Self-pay | Admitting: Internal Medicine

## 2009-04-20 ENCOUNTER — Ambulatory Visit: Payer: Self-pay | Admitting: Specialist

## 2009-04-26 ENCOUNTER — Ambulatory Visit: Payer: Self-pay | Admitting: Internal Medicine

## 2009-09-26 ENCOUNTER — Ambulatory Visit: Payer: Self-pay | Admitting: Internal Medicine

## 2009-09-29 ENCOUNTER — Ambulatory Visit: Payer: Self-pay | Admitting: Internal Medicine

## 2009-10-26 ENCOUNTER — Ambulatory Visit: Payer: Self-pay | Admitting: Internal Medicine

## 2009-12-05 ENCOUNTER — Ambulatory Visit: Payer: Self-pay | Admitting: Surgery

## 2010-03-26 ENCOUNTER — Ambulatory Visit: Payer: Self-pay | Admitting: Internal Medicine

## 2010-03-30 ENCOUNTER — Ambulatory Visit: Payer: Self-pay | Admitting: Internal Medicine

## 2010-04-26 ENCOUNTER — Ambulatory Visit: Payer: Self-pay | Admitting: Internal Medicine

## 2010-09-13 ENCOUNTER — Other Ambulatory Visit: Payer: Self-pay | Admitting: Internal Medicine

## 2010-09-26 ENCOUNTER — Ambulatory Visit: Payer: Self-pay | Admitting: Internal Medicine

## 2010-10-03 ENCOUNTER — Ambulatory Visit: Payer: Self-pay | Admitting: Internal Medicine

## 2010-10-26 ENCOUNTER — Ambulatory Visit: Payer: Self-pay | Admitting: Internal Medicine

## 2010-12-07 ENCOUNTER — Ambulatory Visit: Payer: Self-pay | Admitting: Surgery

## 2011-03-14 ENCOUNTER — Ambulatory Visit: Payer: Self-pay | Admitting: General Practice

## 2011-03-27 ENCOUNTER — Ambulatory Visit: Payer: Self-pay | Admitting: General Practice

## 2011-04-27 ENCOUNTER — Ambulatory Visit: Payer: Self-pay | Admitting: General Practice

## 2011-05-27 ENCOUNTER — Ambulatory Visit: Payer: Self-pay | Admitting: General Practice

## 2011-08-15 ENCOUNTER — Ambulatory Visit: Payer: Self-pay | Admitting: General Practice

## 2011-08-27 ENCOUNTER — Ambulatory Visit: Payer: Self-pay | Admitting: General Practice

## 2011-08-28 ENCOUNTER — Other Ambulatory Visit: Payer: Self-pay | Admitting: *Deleted

## 2011-08-29 MED ORDER — VENLAFAXINE HCL 37.5 MG PO TABS: 37.5000 mg | ORAL_TABLET | Freq: Every day | ORAL | Status: DC

## 2011-09-06 ENCOUNTER — Other Ambulatory Visit: Payer: Self-pay | Admitting: *Deleted

## 2011-09-07 MED ORDER — CALCITONIN (SALMON) 200 UNIT/ACT NA SOLN: 1.0000 | NASAL | Status: AC

## 2011-09-24 ENCOUNTER — Encounter: Payer: Self-pay | Admitting: Internal Medicine

## 2011-09-25 ENCOUNTER — Ambulatory Visit (INDEPENDENT_AMBULATORY_CARE_PROVIDER_SITE_OTHER): Payer: PRIVATE HEALTH INSURANCE | Admitting: Internal Medicine

## 2011-09-25 ENCOUNTER — Encounter: Payer: Self-pay | Admitting: Internal Medicine

## 2011-09-25 DIAGNOSIS — Z853 Personal history of malignant neoplasm of breast: Secondary | ICD-10-CM

## 2011-09-25 DIAGNOSIS — Z124 Encounter for screening for malignant neoplasm of cervix: Secondary | ICD-10-CM | POA: Insufficient documentation

## 2011-09-25 NOTE — Assessment & Plan Note (Signed)
Released by Dr. Sherrlyn Hock s/p treatment 4 years ago.  Now followed with annual surveillance breast exams and mammograms by Dr. Murriel Hopper  her surgeon

## 2011-09-25 NOTE — Progress Notes (Signed)
  Subjective:    Patient ID: Marissa Phillips, female    DOB: [birth year], 50 y.o.   MRN: [id number]  HPI    Review of Systems     Objective:   Physical Exam        Assessment & Plan:  Screening for cervical CA:  She had a normal PAP last year and no issues, no prior abnormals.  Will repeat in 2013. History of breast cancer:  Annual surveillance with breast exam and mammo are done every January by Renda Rolls, as Dr. Sherrlyn Hock released her several yars ago. Subjective:     Marissa Phillips is a 50 y.o. female here for a routine exam.  Current complaints: none.  Personal health questionnaire reviewed: yes.   Gynecologic History No LMP recorded. Patient is not currently having periods (Reason: Other). Contraception: post menopausal status Last Pap: 2011. Results were: normal Last mammogram: 2012. Results were: normal  Obstetric History OB History    Grav Para Term Preterm Abortions TAB SAB Ect Mult Living                   The following portions of the patient's history were reviewed and updated as appropriate: allergies, current medications, past family history, past medical history, past social history, past surgical history and problem list.  Review of Systems A comprehensive review of systems was negative.    Objective:    BP 125/78  Pulse 81  Temp(Src) 98.6 F (37 C) (Oral)  Resp 16  Ht 5\' 6"  (1.676 m)  Wt 181 lb 12 oz (82.441 kg)  BMI 29.34 kg/m2  SpO2 94%  General Appearance:    Alert, cooperative, no distress, appears stated age  Head:    Normocephalic, without obvious abnormality, atraumatic  Eyes:    PERRL, conjunctiva/corneas clear, EOM's intact, fundi    benign, both eyes  Ears:    Normal TM's and external ear canals, both ears  Nose:   Nares normal, septum midline, mucosa normal, no drainage    or sinus tenderness  Throat:   Lips, mucosa, and tongue normal; teeth and gums normal  Neck:   Supple, symmetrical, trachea midline, no adenopathy;    thyroid:   no enlargement/tenderness/nodules; no carotid   bruit or JVD  Back:     Symmetric, no curvature, ROM normal, no CVA tenderness  Lungs:     Clear to auscultation bilaterally, respirations unlabored  Chest Wall:    No tenderness or deformity   Heart:    Regular rate and rhythm, S1 and S2 normal, no murmur, rub   or gallop  Abdomen:     Soft, non-tender, bowel sounds active all four quadrants,    no masses, no organomegaly  Extremities:   Extremities normal, atraumatic, no cyanosis or edema  Pulses:   2+ and symmetric all extremities  Skin:   Skin color, texture, turgor normal, no rashes or lesions  Lymph nodes:   Cervical, supraclavicular, and axillary nodes normal  Neurologic:   CNII-XII intact, normal strength, sensation and reflexes    throughout      Assessment:    Healthy female exam.    Plan:    Follow up in: 6 months.

## 2011-09-25 NOTE — Assessment & Plan Note (Signed)
PAP was normal last year.  Will repeat next year.

## 2011-10-31 ENCOUNTER — Ambulatory Visit: Payer: Self-pay | Admitting: General Practice

## 2011-11-22 ENCOUNTER — Telehealth: Payer: Self-pay | Admitting: Internal Medicine

## 2011-11-22 NOTE — Telephone Encounter (Signed)
The shingles vaccine  is not recommended for women her age (37) only in women 66 or older.

## 2011-11-23 NOTE — Telephone Encounter (Signed)
I advised patient what Dr. Darrick Huntsman had said, she understood.  She just thought since they were offering it for free she would go ahead and get it.

## 2011-11-27 ENCOUNTER — Ambulatory Visit: Payer: Self-pay | Admitting: General Practice

## 2011-12-10 ENCOUNTER — Ambulatory Visit: Payer: Self-pay | Admitting: Surgery

## 2012-01-01 ENCOUNTER — Encounter: Payer: Self-pay | Admitting: Internal Medicine

## 2012-02-13 ENCOUNTER — Ambulatory Visit: Payer: Self-pay | Admitting: General Practice

## 2012-02-25 ENCOUNTER — Ambulatory Visit: Payer: Self-pay | Admitting: General Practice

## 2012-05-14 ENCOUNTER — Ambulatory Visit: Payer: Self-pay | Admitting: General Practice

## 2012-08-15 ENCOUNTER — Other Ambulatory Visit: Payer: Self-pay | Admitting: Internal Medicine

## 2012-08-16 MED ORDER — VENLAFAXINE HCL 37.5 MG PO TABS: 37.5000 mg | ORAL_TABLET | ORAL | Status: DC

## 2012-09-24 ENCOUNTER — Encounter: Payer: Self-pay | Admitting: Internal Medicine

## 2012-09-24 ENCOUNTER — Other Ambulatory Visit (HOSPITAL_COMMUNITY)
Admission: RE | Admit: 2012-09-24 | Discharge: 2012-09-24 | Disposition: A | Discharge status: Home / Self Care | Payer: PRIVATE HEALTH INSURANCE | Source: Ambulatory Visit | Attending: Internal Medicine | Admitting: Internal Medicine

## 2012-09-24 ENCOUNTER — Ambulatory Visit (INDEPENDENT_AMBULATORY_CARE_PROVIDER_SITE_OTHER): Payer: PRIVATE HEALTH INSURANCE | Admitting: Internal Medicine

## 2012-09-24 VITALS — BP 112/64 | HR 71 | Temp 98.5°F | Ht 67.0 in | Wt 184.2 lb

## 2012-09-24 DIAGNOSIS — Z853 Personal history of malignant neoplasm of breast: Secondary | ICD-10-CM

## 2012-09-24 DIAGNOSIS — Z1151 Encounter for screening for human papillomavirus (HPV): Secondary | ICD-10-CM | POA: Insufficient documentation

## 2012-09-24 DIAGNOSIS — Z129 Encounter for screening for malignant neoplasm, site unspecified: Secondary | ICD-10-CM

## 2012-09-24 DIAGNOSIS — Z01419 Encounter for gynecological examination (general) (routine) without abnormal findings: Secondary | ICD-10-CM | POA: Insufficient documentation

## 2012-09-24 DIAGNOSIS — Z124 Encounter for screening for malignant neoplasm of cervix: Secondary | ICD-10-CM

## 2012-09-24 DIAGNOSIS — M810 Age-related osteoporosis without current pathological fracture: Secondary | ICD-10-CM

## 2012-09-24 NOTE — Assessment & Plan Note (Addendum)
No recurrence since surgery/chemo 2006 Annual mammograms and breast exam by Renda Rolls

## 2012-09-24 NOTE — Progress Notes (Signed)
Patient ID: Marissa Phillips, female   DOB: 04/29/1961, 51 y.o.   MRN: [id number]  Subjective:     Marissa Phillips is a 51 y.o. female and is here for a comprehensive physical exam. The patient reports problems - with now well controlled allergic rhinitis.  History   Social History  . Marital Status: Married    Spouse Name: N/A    Number of Children: N/A  . Years of Education: N/A   Occupational History  . Not on file.   Social History Main Topics  . Smoking status: Never Smoker   . Smokeless tobacco: Never Used  . Alcohol Use: No  . Drug Use: No  . Sexually Active: Not on file   Other Topics Concern  . Not on file   Social History Narrative  . No narrative on file   Health Maintenance  Topic Date Due  . Tetanus/tdap  07/10/1981  . Mammogram  07/10/2012  . Colonoscopy  07/10/2012  . Influenza Vaccine  07/27/2012  . Pap Smear  09/11/2013    The following portions of the patient's history were reviewed and updated as appropriate: allergies, current medications, past family history, past medical history, past social history, past surgical history and problem list.  Review of Systems A comprehensive review of systems was negative.   Objective:        Assessment:    Healthy female exam. BP 112/64  Pulse 71  Temp 98.5 F (36.9 C) (Oral)  Ht 5\' 7"  (1.702 m)  Wt 184 lb 4 oz (83.575 kg)  BMI 28.86 kg/m2  SpO2 97%  General Appearance:    Alert, cooperative, no distress, appears stated age  Head:    Normocephalic, without obvious abnormality, atraumatic  Eyes:    PERRL, conjunctiva/corneas clear, EOM's intact, fundi    benign, both eyes  Ears:    Normal TM's and external ear canals, both ears  Nose:   Nares normal, septum midline, mucosa normal, no drainage    or sinus tenderness  Throat:   Lips, mucosa, and tongue normal; teeth and gums normal  Neck:   Supple, symmetrical, trachea midline, no adenopathy;    thyroid:  no enlargement/tenderness/nodules; no carotid  bruit or JVD  Back:     Symmetric, no curvature, ROM normal, no CVA tenderness  Lungs:     Clear to auscultation bilaterally, respirations unlabored  Chest Wall:    No tenderness or deformity   Heart:    Regular rate and rhythm, S1 and S2 normal, no murmur, rub   or gallop  Breast Exam:    Deferred.  Done annually by Renda Rolls  Abdomen:     Soft, non-tender, bowel sounds active all four quadrants,    no masses, no organomegaly  Genitalia:    Normal female without lesion, discharge or tenderness     Extremities:   Extremities normal, atraumatic, no cyanosis or edema  Pulses:   2+ and symmetric all extremities  Skin:   Skin color, texture, turgor normal, no rashes or lesions  Lymph nodes:   Cervical, supraclavicular, and axillary nodes normal  Neurologic:   CNII-XII intact, normal strength, sensation and reflexes    throughout       Plan:   History of breast cancer No recurrence since surgery/chemo 2006 Annual mammograms and breast exam by Renda Rolls  Screening for cervical cancer Last PAP normal 2011,  Repeat done today.     Updated Medication List Outpatient Encounter Prescriptions as of 09/24/2012  Medication Sig Dispense Refill  . Calcium Carbonate-Vitamin D (CALCIUM + D) 600-200 MG-UNIT TABS Take by mouth.        . Cranberry 1000 MG CAPS Take by mouth.        . Multiple Vitamin (MULTIVITAMIN) tablet Take 1 tablet by mouth daily.        Marland Kitchen venlafaxine (EFFEXOR) 37.5 MG tablet Take 1 tablet (37.5 mg total) by mouth every other day.  30 tablet  3  . vitamin E (VITAMIN E) 400 UNIT capsule Take 400 Units by mouth daily.        Marland Kitchen DISCONTD: albuterol (PROVENTIL HFA;VENTOLIN HFA) 108 (90 BASE) MCG/ACT inhaler Inhale 2 puffs into the lungs every 4 (four) hours as needed.

## 2012-09-25 ENCOUNTER — Encounter: Payer: Self-pay | Admitting: Internal Medicine

## 2012-09-25 NOTE — Assessment & Plan Note (Addendum)
Last PAP normal 2011,  Repeat done today.

## 2012-09-30 ENCOUNTER — Telehealth: Payer: Self-pay | Admitting: Internal Medicine

## 2012-09-30 NOTE — Telephone Encounter (Signed)
Pt called checking on her rx that dr Darrick Huntsman was to send in last week [facility]

## 2012-09-30 NOTE — Telephone Encounter (Signed)
???   Which medication.  I have no idea from chart which one she is referring to

## 2012-10-01 ENCOUNTER — Telehealth: Payer: Self-pay | Admitting: *Deleted

## 2012-10-01 NOTE — Telephone Encounter (Signed)
Left message on patient vm to call me back regarding Rx that was suppose to be sent in to [facility]

## 2012-10-01 NOTE — Telephone Encounter (Signed)
Left message for patient to call me back regarding Rx to be sent to [facility].

## 2012-10-01 NOTE — Telephone Encounter (Signed)
Patient called upset and concerned about her refill for nasal spray. I called pharmacy and they said they had not received it yet so I gave them the information. I also called patient back and let her know.

## 2012-10-02 NOTE — Telephone Encounter (Signed)
Left message on patient mobile vm to call me back regarding medication she is referring to be called in to pharmacy.

## 2012-10-13 ENCOUNTER — Other Ambulatory Visit: Payer: Self-pay | Admitting: Internal Medicine

## 2012-10-13 LAB — COMPREHENSIVE METABOLIC PANEL
Albumin: 3.9 g/dL (ref 3.4–5.0)
Anion Gap: 5 — ABNORMAL LOW (ref 7–16)
BUN: 9 mg/dL (ref 7–18)
Bilirubin,Total: 0.4 mg/dL (ref 0.2–1.0)
Chloride: 108 mmol/L — ABNORMAL HIGH (ref 98–107)
Creatinine: 0.64 mg/dL (ref 0.60–1.30)
EGFR (African American): >60
EGFR (Non-African Amer.): >60
Glucose: 80 mg/dL (ref 65–99)
Potassium: 4 mmol/L (ref 3.5–5.1)
SGOT(AST): 25 U/L (ref 15–37)
SGPT (ALT): 24 U/L (ref 12–78)
Total Protein: 7.5 g/dL (ref 6.4–8.2)

## 2012-10-13 LAB — CBC WITH DIFFERENTIAL/PLATELET
Basophil #: 0 10*3/uL (ref 0.0–0.1)
Eosinophil #: 0.1 10*3/uL (ref 0.0–0.7)
HCT: 37.6 % (ref 35.0–47.0)
Lymphocyte #: 2.2 10*3/uL (ref 1.0–3.6)
Lymphocyte %: 42.8 %
MCHC: 34.6 g/dL (ref 32.0–36.0)
Neutrophil #: 2.3 10*3/uL (ref 1.4–6.5)
Platelet: 203 10*3/uL (ref 150–440)
RDW: 13.1 % (ref 11.5–14.5)

## 2012-10-14 ENCOUNTER — Telehealth: Payer: Self-pay | Admitting: Internal Medicine

## 2012-10-14 NOTE — Telephone Encounter (Signed)
Left message on patient vm with normal lab results. 

## 2012-10-14 NOTE — Telephone Encounter (Signed)
All labs including cholesterol , Cbc, thyroid and screen for diabetes are normal

## 2012-10-24 ENCOUNTER — Encounter: Payer: Self-pay | Admitting: Internal Medicine

## 2012-11-13 ENCOUNTER — Ambulatory Visit: Payer: Self-pay | Admitting: Internal Medicine

## 2012-11-14 ENCOUNTER — Telehealth: Payer: Self-pay | Admitting: Internal Medicine

## 2012-11-14 NOTE — Telephone Encounter (Signed)
Left message on patient vm to call office and speak to any nurse about lab results.

## 2012-11-14 NOTE — Telephone Encounter (Signed)
She has mild osteopenia (mild bone loss by recent DEXA scan) which is slowly improving ,  continue calcitonin,  calcium 1200 mg daily vit d 1000 units daily and weight bearing exercise.  We will repeat in 2 years.

## 2012-12-04 ENCOUNTER — Encounter: Payer: Self-pay | Admitting: Internal Medicine

## 2012-12-11 ENCOUNTER — Ambulatory Visit: Payer: Self-pay | Admitting: Surgery

## 2012-12-11 IMAGING — MG MM CAD DIAGNOSTIC MAMMO
1 series · 7 of 7 positions shown · non-contrast
Comparison: none

REASON FOR EXAM: HX BRST CA AND YRLY
COMMENTS:

[R CC · right · 7 of 7 slices shown]
[im 1/7]
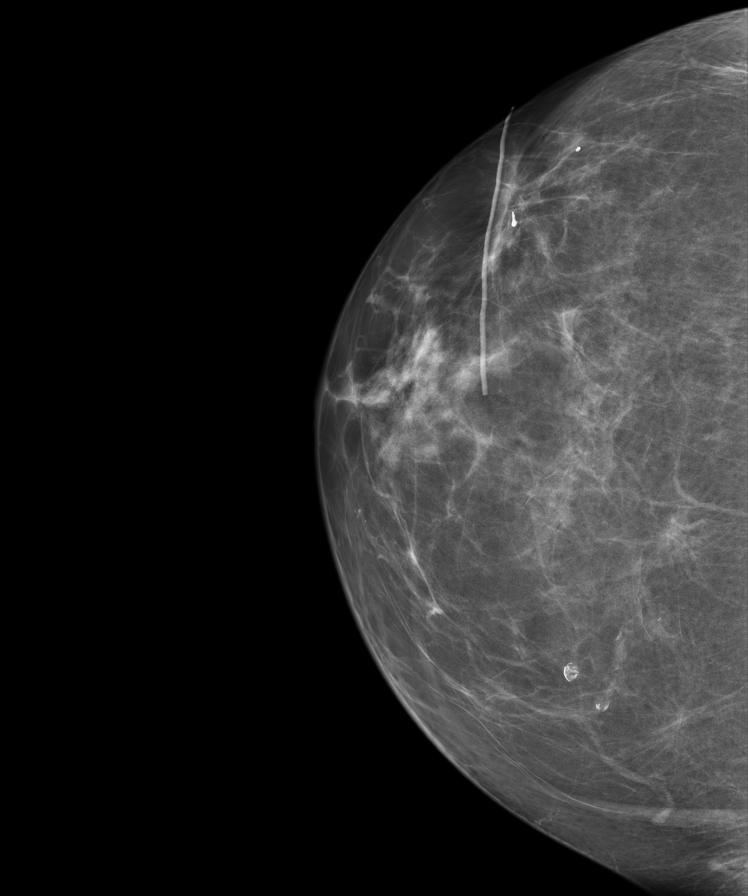
[im 2/7]
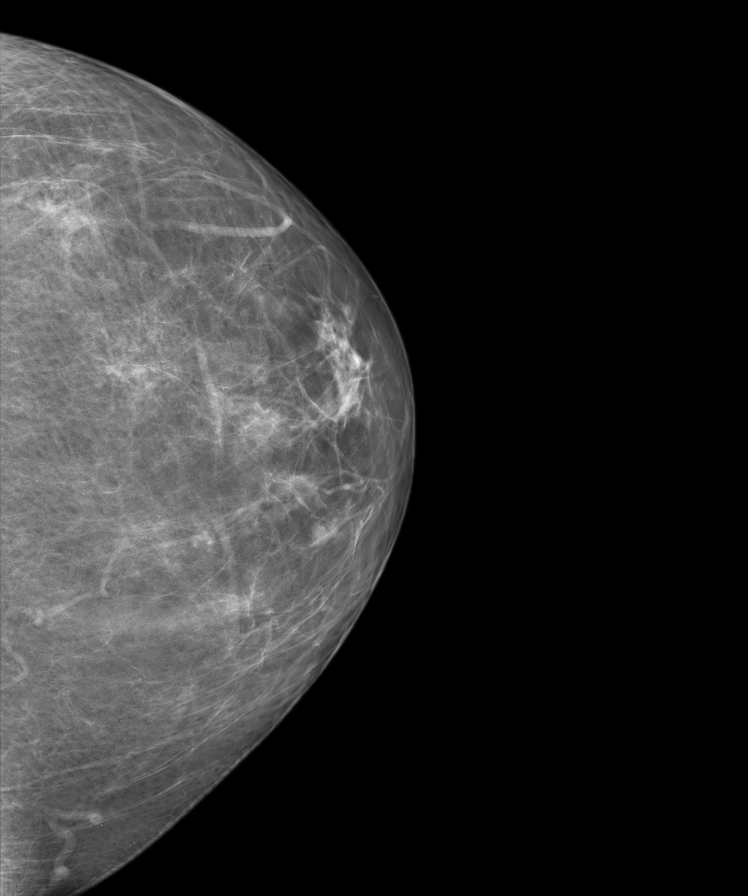
[im 3/7]
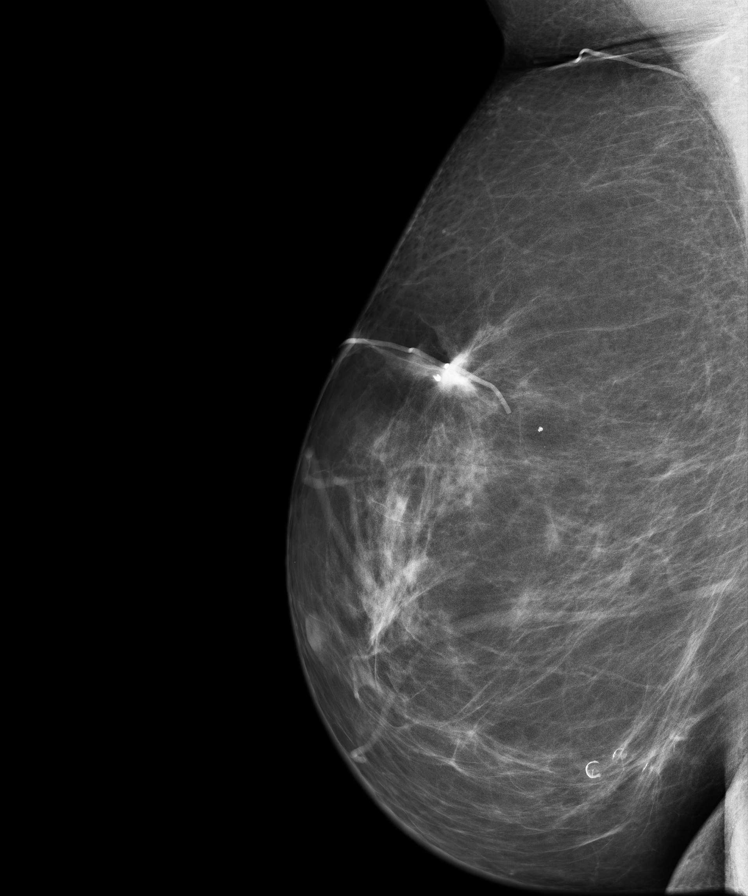
[im 4/7]
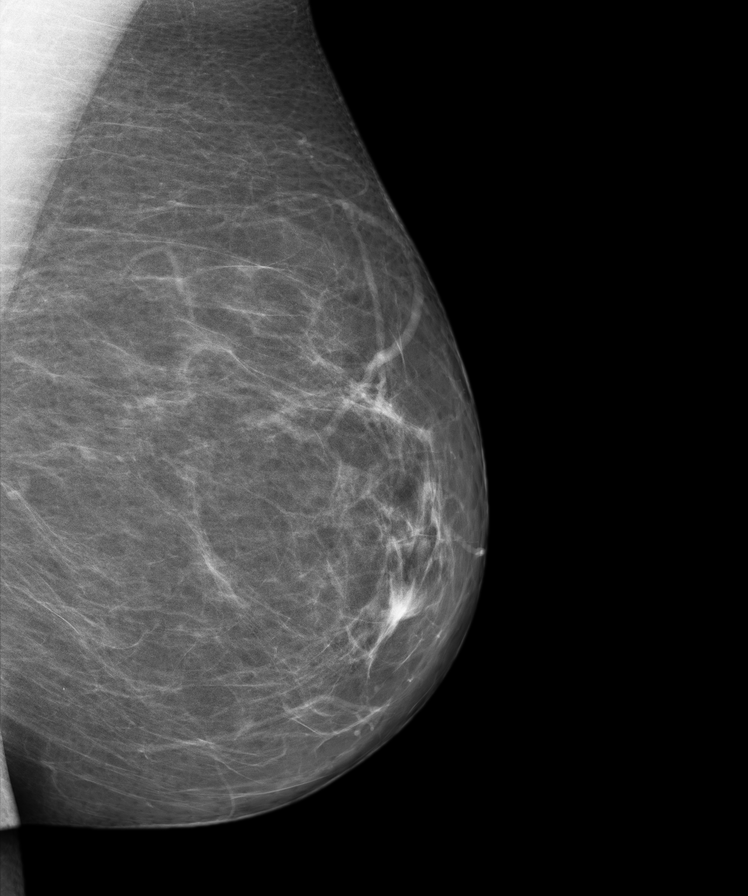
[im 5/7]
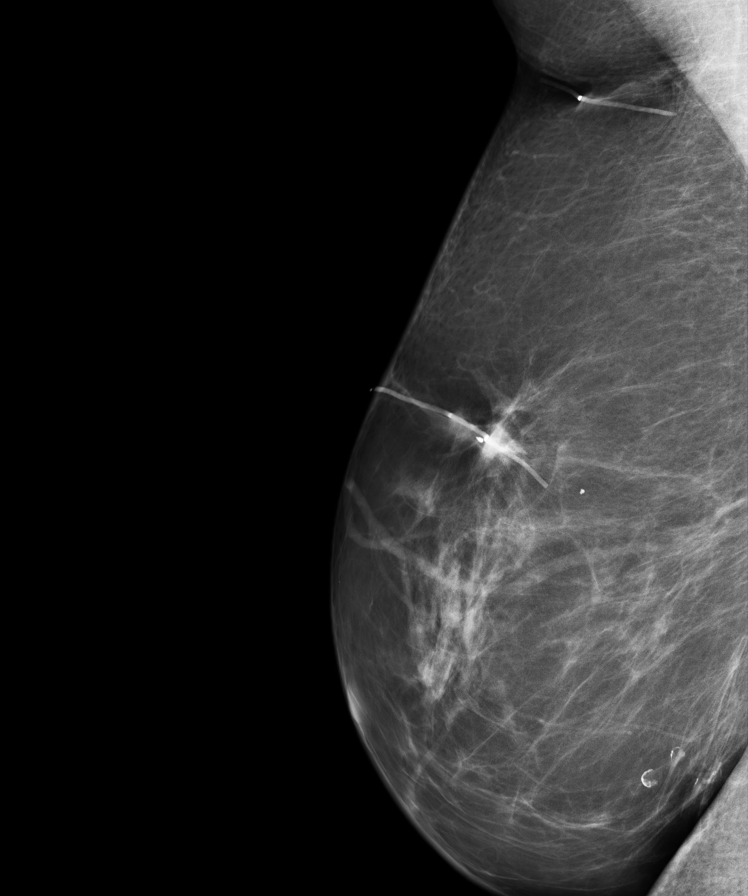
[im 6/7]
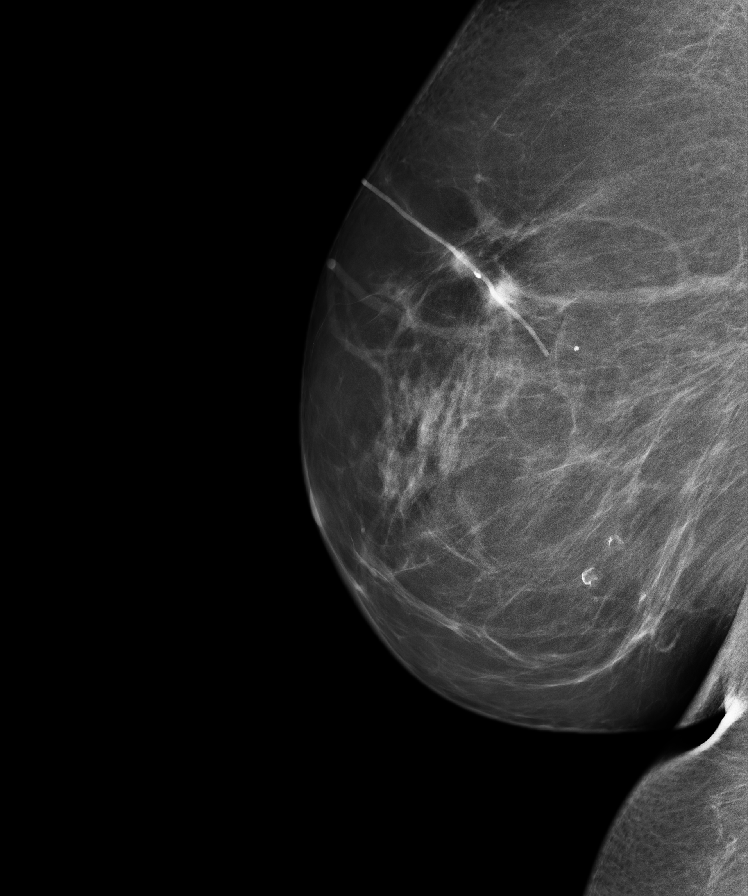
[im 7/7]
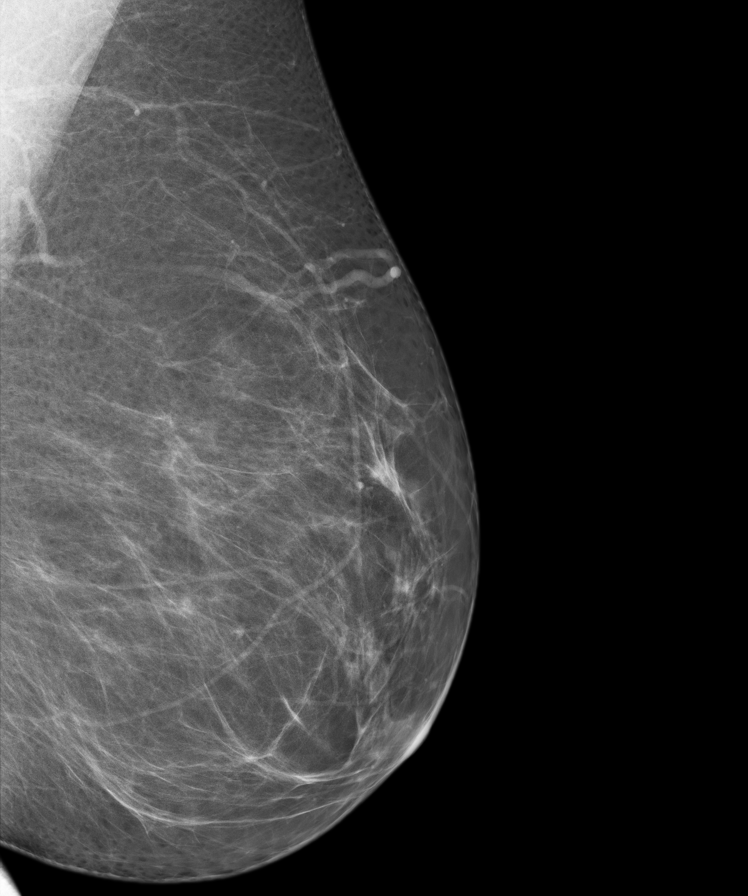

[7 of 7 positions shown; findings below may reference images not displayed]

PROCEDURE:     MAM - MAM DGTL DIAGNOSTIC MAMMO W/CAD  - [DATE]  [DATE]

RESULT:     Comparison made to a study [DATE],[DATE], and [DATE]. The patient has history of breast malignancy in
[4F] treated with lumpectomy and chemoradiation.

The breasts exhibit a predominantly fatty replaced parenchymal pattern. A
small amount of dense parenchymal tissue remains predominantly in the
retroareolar region. At the site of previous biopsy there is stable
increased density deep to the scar marker. There are a few coarse
macrocalcifications on the right. There are no malignant appearing groupings
of microcalcification and no areas of new architectural distortion.
IMPRESSION: There are no findings suspicious for recurrent or new
malignancy.

BI-RADS 2: Benign findings.

Recommendation: Please continue to encourage yearly mammographic followup.

BREAST COMPOSITION: The breast composition is ALMOST ENTIRELY FATTY
(glandular tissue is less than 25%)

A NEGATIVE MAMMOGRAM REPORT DOES NOT PRECLUDE BIOPSY OR OTHER EVALUATION OF
A CLINICALLY PALPABLE OR OTHERWISE SUSPICIOUS MASS OR LESION. BREAST CANCER
MAY NOT BE DETECTED BY MAMMOGRAPHY IN UP TO 10% OF CASES.

Dictation site:1

## 2012-12-29 ENCOUNTER — Ambulatory Visit: Payer: Self-pay

## 2012-12-30 ENCOUNTER — Telehealth: Payer: Self-pay | Admitting: Internal Medicine

## 2012-12-30 NOTE — Telephone Encounter (Signed)
Patient Information:  Caller Name: Loretta  Phone: [phone]  Patient: Marissa Phillips, Marissa Phillips  Gender: Female  DOB: 11/03/1961  Age: 52 Years  PCP: Duncan Dull (Adults only)  Pregnant: No  Office Follow Up:  Does the office need to follow up with this patient?: Yes  Instructions For The Office: Patient disposition is see in office today to rule out bacterial infection.  Patient was seen at Urgent care last night and placed on Ceftin 500mg  BID x 10 days and has been on antibiotic less than 48 hours.  Patient also has not been doing saline washes and instructed to start.  Message sent to see if provider would like to see patient for evaluation or give antibiotic more time to work.    Please follow up with patient with further instruction.  RN Note:  Patient was seen in urgent care for sinus symptoms and placed on Ceftin 500mg  BID for 10 days.  Patient has take 3 doses of medication as of today which is < 48 hours of antibiotic use.  Patient states that she does feel better and has decreased nasal congestion however has started having pain in the upper left cheek area. Pain is a 5/10.  Holds a ice pack to area to help with pain. Patient states that warmth makes symptoms worse.  Symptoms  Reason For Call & Symptoms: Sinus infection follow up  Reviewed Health History In EMR: Yes  Reviewed Medications In EMR: Yes  Reviewed Allergies In EMR: Yes  Reviewed Surgeries / Procedures: Yes  Date of Onset of Symptoms: 12/28/2012  Treatments Tried: Allegra, Sudafed, Mucinex, and Ceftin 500mg  Bid x 10 days (still taking Ceftin)  Treatments Tried Worked: No OB / GYN:  LMP: Unknown  Guideline(s) Used:  Sinus Pain and Congestion  Disposition Per Guideline:   Go to Office Now  Reason For Disposition Reached:   Severe sinus pain  Advice Given:  Reassurance:   Sinus congestion is a normal part of a cold.  Usually home treatment with nasal washes can prevent an actual bacterial sinus infection.  Hydration:  Drink plenty of liquids (6-8 glasses of water daily). If the air in your home is dry, use a cool mist humidifier

## 2013-01-21 ENCOUNTER — Ambulatory Visit: Payer: Self-pay | Admitting: Surgery

## 2013-03-16 ENCOUNTER — Telehealth: Payer: Self-pay | Admitting: Internal Medicine

## 2013-03-16 NOTE — Telephone Encounter (Signed)
Pt called and spoke to traige nurse yesterday and they told her to call office today and get appointment  Pt has fever coughing up yellow to brown stuff  Chills.  Pt stated she has gotten worse.  Please call pt to advise what to do.

## 2013-03-16 NOTE — Telephone Encounter (Signed)
Called patient and stated she had followed up in employee clinic today.

## 2013-07-04 ENCOUNTER — Emergency Department: Payer: Self-pay | Admitting: Emergency Medicine

## 2013-09-23 ENCOUNTER — Encounter: Payer: PRIVATE HEALTH INSURANCE | Admitting: Internal Medicine

## 2013-12-14 ENCOUNTER — Ambulatory Visit: Payer: Self-pay | Admitting: Surgery

## 2013-12-14 IMAGING — MG MM DIGITAL SCREENING BILAT W/ CAD
1 series · 5 of 5 positions shown · non-contrast
Comparison: Previous exam(s)

ACR Breast Density Category a: The breast tissue is almost entirely
fatty.

CLINICAL DATA: Screening.

EXAM:
DIGITAL SCREENING BILATERAL MAMMOGRAM WITH CAD

[R CC · right · 5 of 5 slices shown]
[im 1/5]
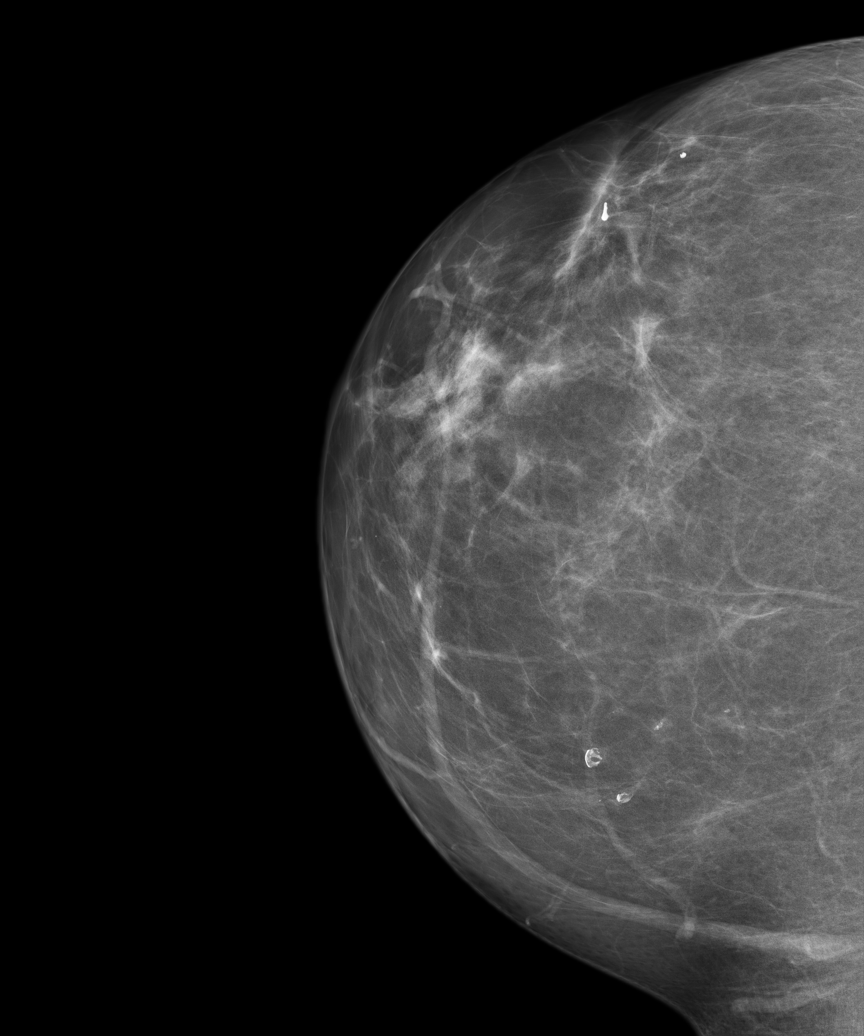
[im 2/5]
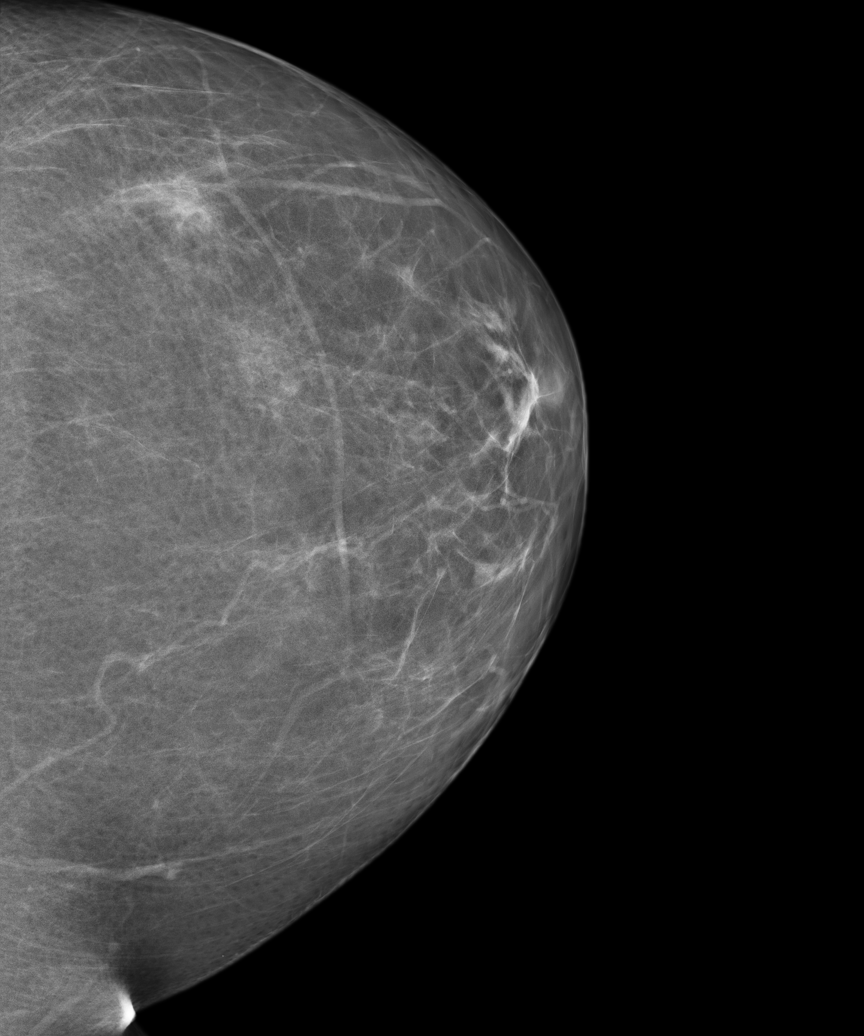
[im 3/5]
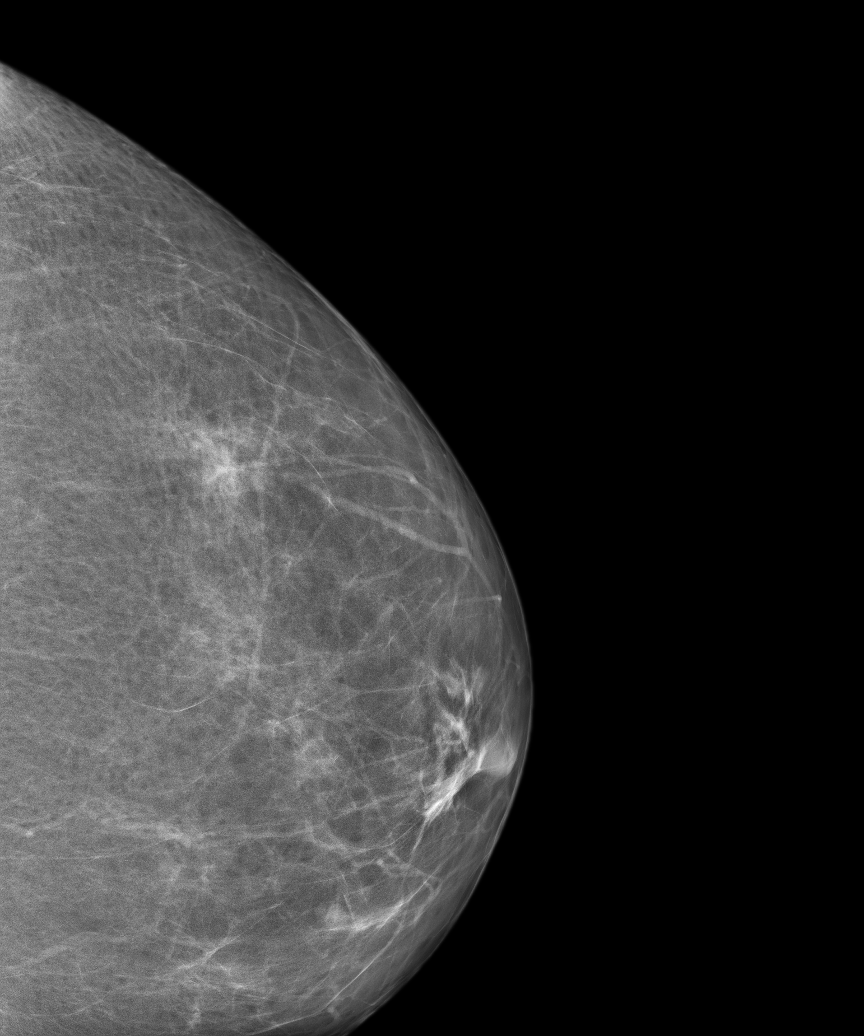
[im 4/5]
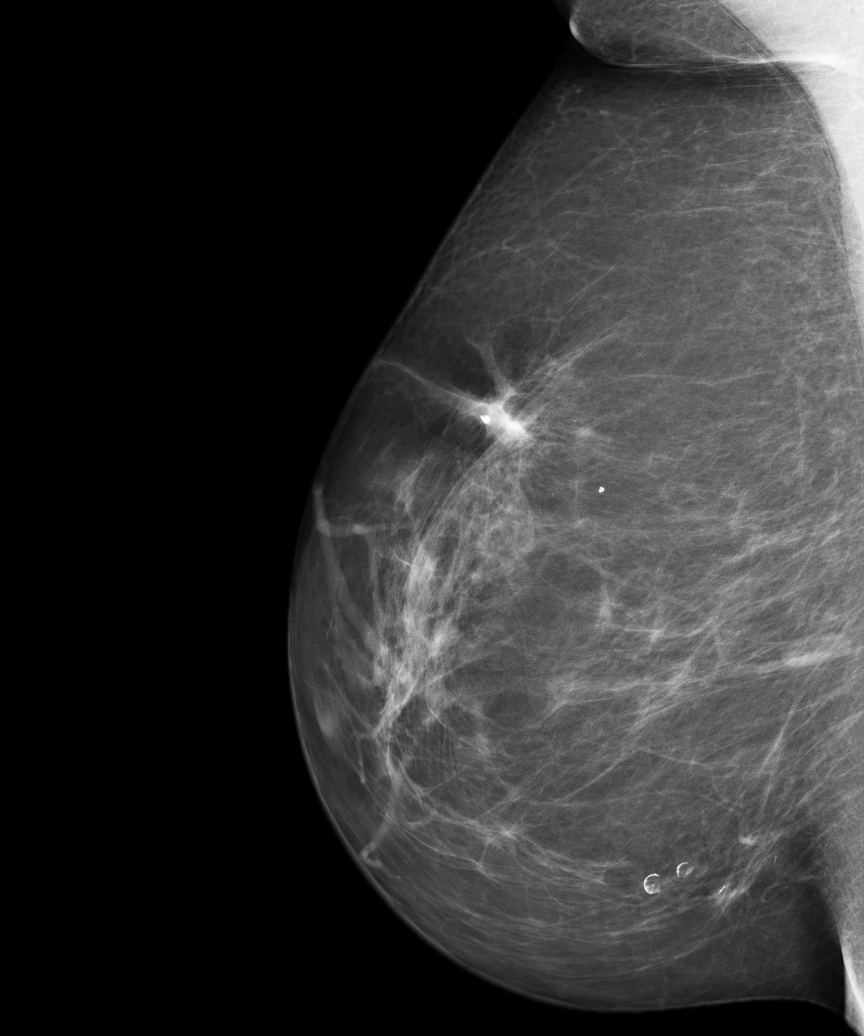
[im 5/5]
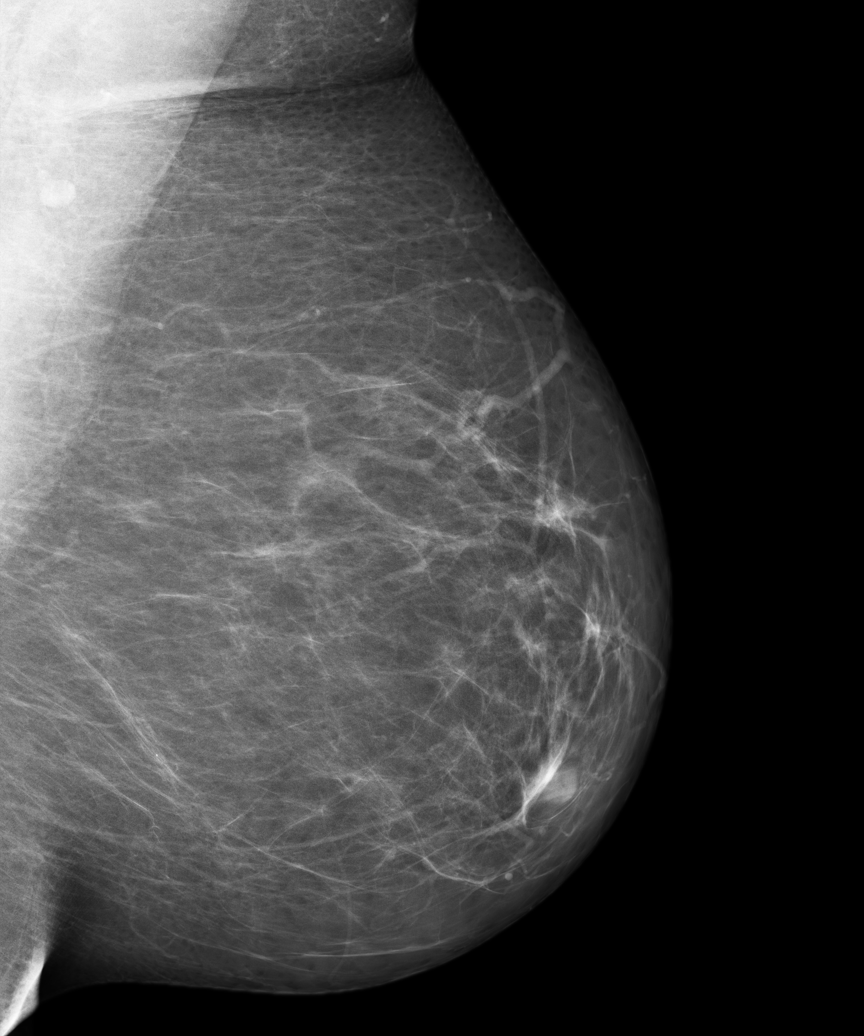

[5 of 5 positions shown; findings below may reference images not displayed]

FINDINGS: There are no findings suspicious for malignancy. Images were
processed with CAD.
IMPRESSION: No mammographic evidence of malignancy. A result letter of this
screening mammogram will be mailed directly to the patient.

RECOMMENDATION:
Screening mammogram in one year. (Code:[ED])

BI-RADS CATEGORY  1: Negative

## 2014-06-20 ENCOUNTER — Ambulatory Visit: Payer: Self-pay | Admitting: Internal Medicine

## 2014-06-20 LAB — URINALYSIS, COMPLETE: WBC UR: >30 /HPF (ref 0–5)

## 2014-06-22 LAB — URINE CULTURE

## 2014-07-06 DIAGNOSIS — Z9109 Other allergy status, other than to drugs and biological substances: Secondary | ICD-10-CM | POA: Insufficient documentation

## 2014-07-06 DIAGNOSIS — D649 Anemia, unspecified: Secondary | ICD-10-CM | POA: Insufficient documentation

## 2014-07-06 DIAGNOSIS — Z87440 Personal history of urinary (tract) infections: Secondary | ICD-10-CM | POA: Insufficient documentation

## 2014-07-06 DIAGNOSIS — J45909 Unspecified asthma, uncomplicated: Secondary | ICD-10-CM | POA: Insufficient documentation

## 2014-07-06 DIAGNOSIS — K219 Gastro-esophageal reflux disease without esophagitis: Secondary | ICD-10-CM | POA: Insufficient documentation

## 2014-07-06 DIAGNOSIS — M810 Age-related osteoporosis without current pathological fracture: Secondary | ICD-10-CM | POA: Insufficient documentation

## 2014-12-15 ENCOUNTER — Ambulatory Visit: Payer: Self-pay | Admitting: Internal Medicine

## 2014-12-15 LAB — HM MAMMOGRAPHY: HM MAMMO: NEGATIVE

## 2014-12-15 LAB — HM DEXA SCAN

## 2014-12-15 IMAGING — MG MM DIGITAL SCREENING BILAT W/ TOMO W/ CAD
8 of 12 series · 8 of 28 positions shown · non-contrast
Comparison: Previous exam(s).

CLINICAL DATA: Screening.

EXAM:
DIGITAL SCREENING BILATERAL MAMMOGRAM WITH 3D TOMO WITH CAD

[L MLO synth-2D]
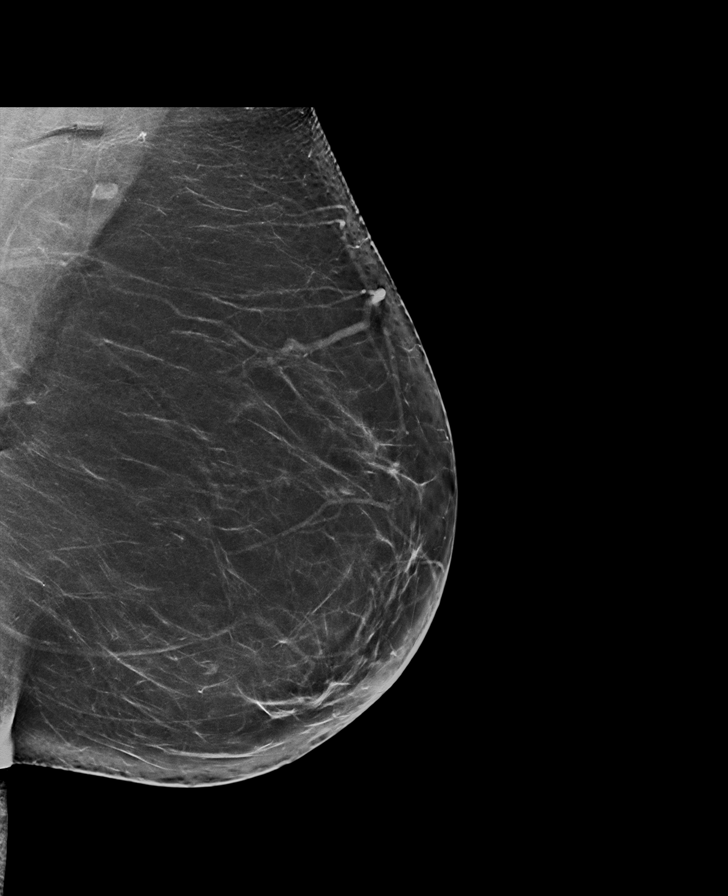

[R CC synth-2D]
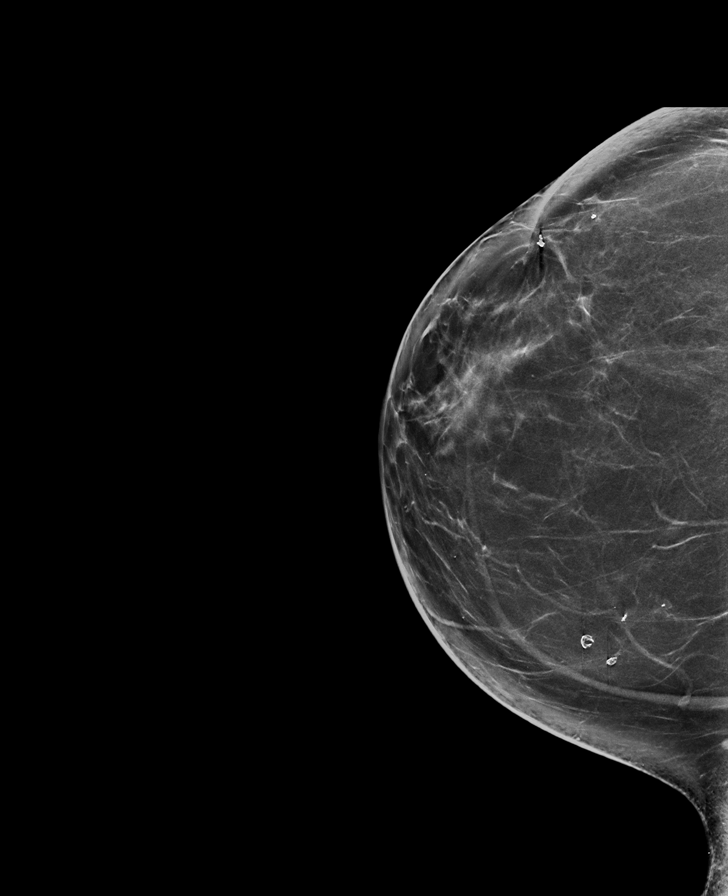

[L CC]
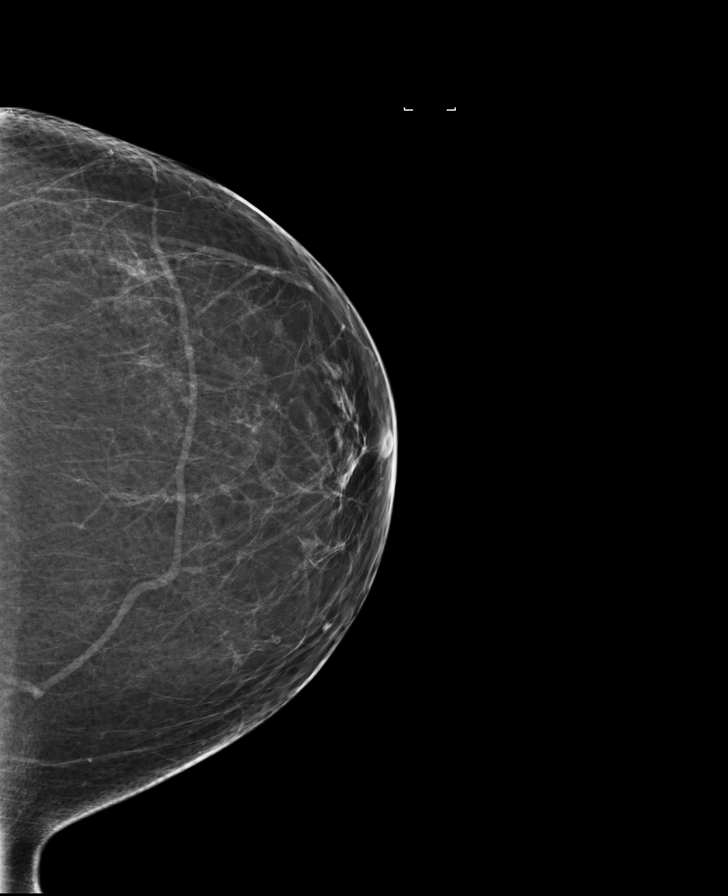

[R MLO]
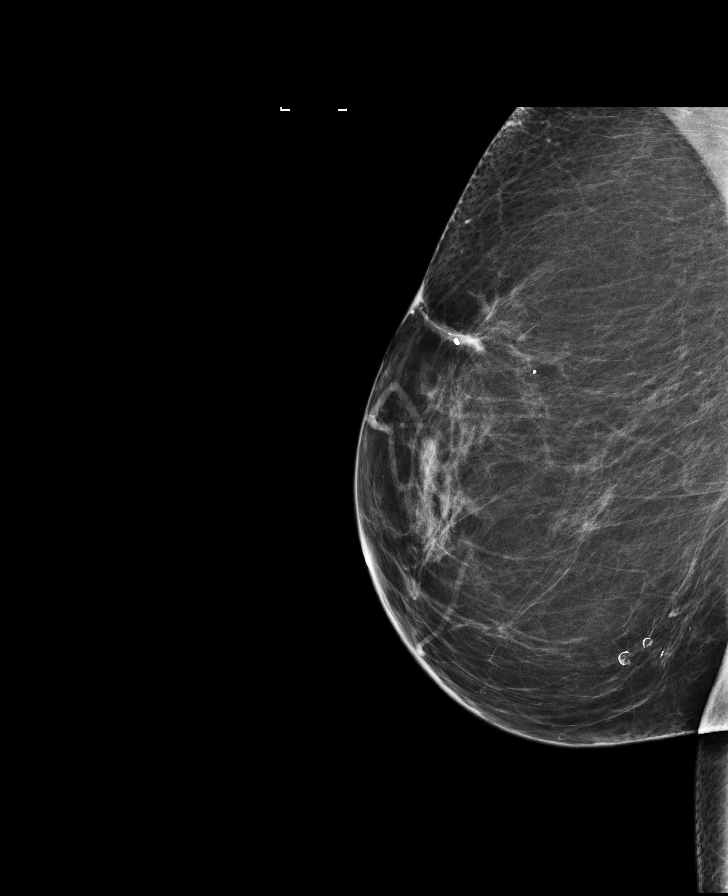

[R CC]
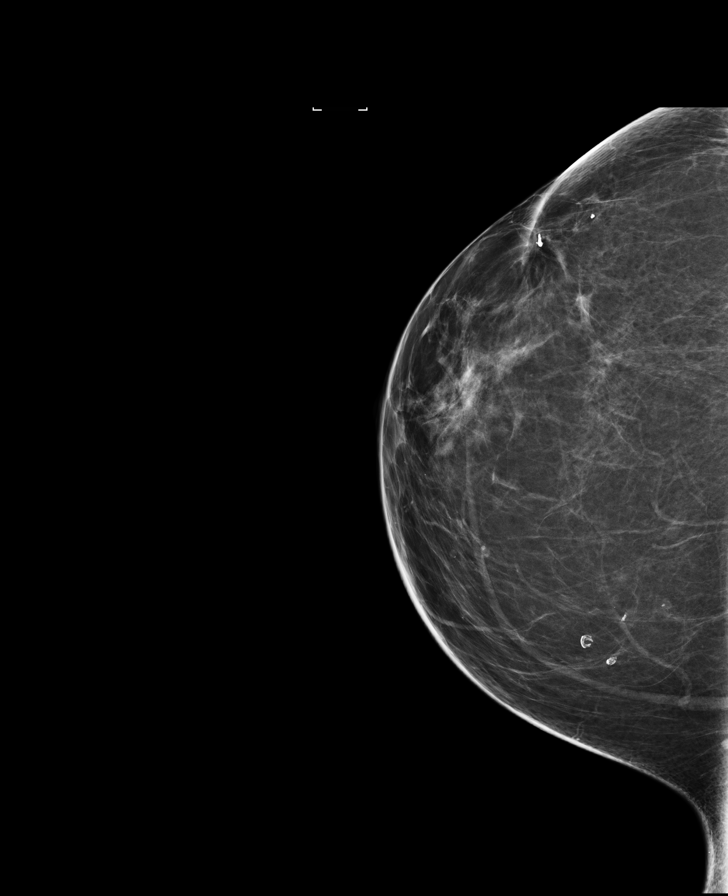

[L CC synth-2D]
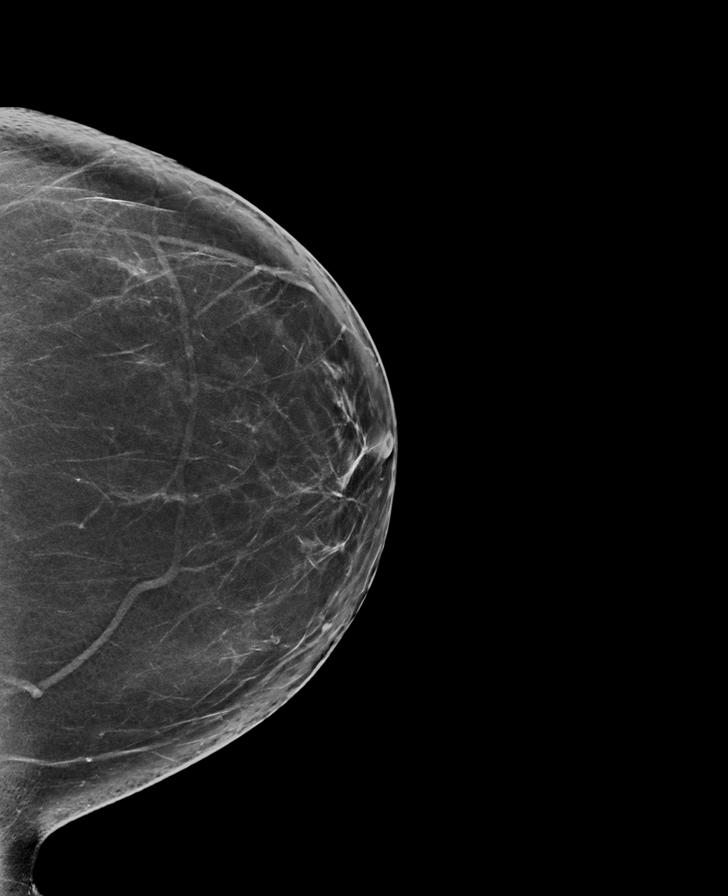

[R MLO synth-2D]
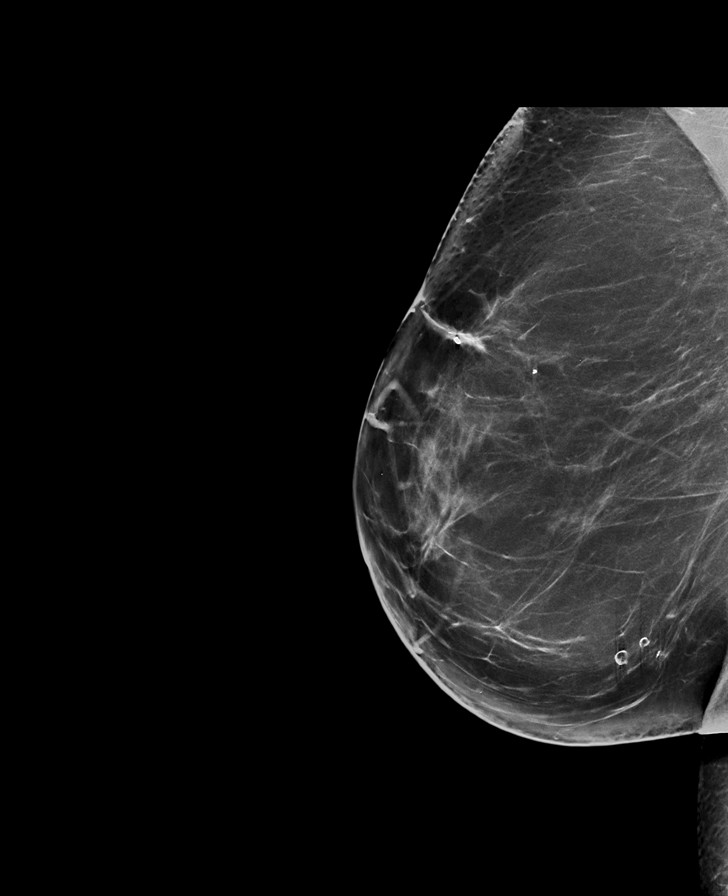

[L MLO]
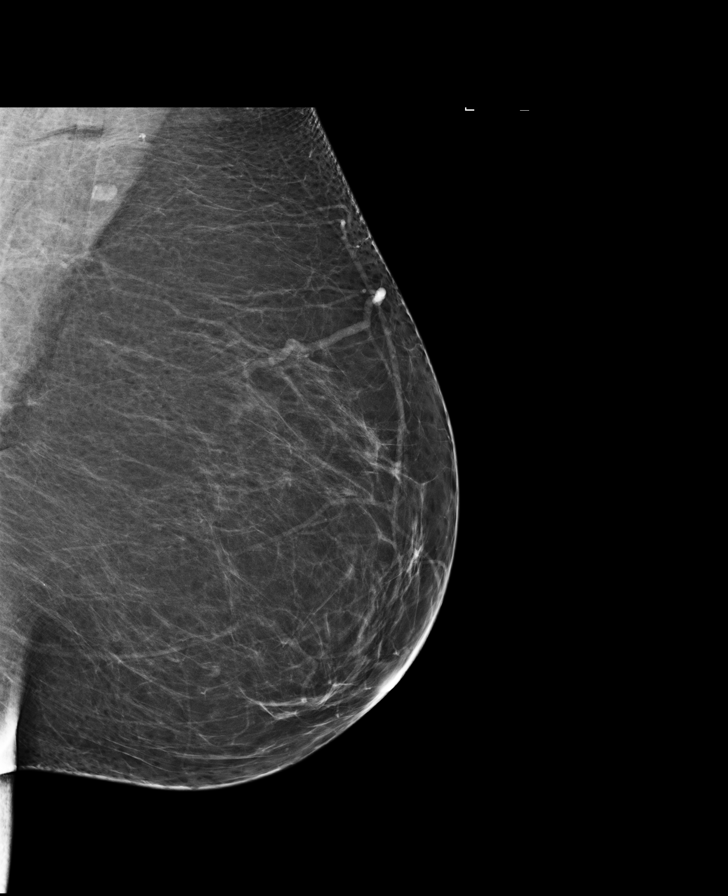

[8 of 28 positions shown; findings below may reference images not displayed]

ACR Breast Density Category b: There are scattered areas of
fibroglandular density.
FINDINGS: There are no findings suspicious for malignancy. Images were
processed with CAD.
IMPRESSION: No mammographic evidence of malignancy. A result letter of this
screening mammogram will be mailed directly to the patient.

RECOMMENDATION:
Screening mammogram in one year. (Code:[F0])

BI-RADS CATEGORY  1: Negative.

## 2014-12-22 ENCOUNTER — Encounter: Payer: Self-pay | Admitting: *Deleted

## 2015-03-18 NOTE — Op Note (Signed)
PATIENT NAME:  Marissa Phillips, Marissa Phillips MR#:  [id number] DATE OF BIRTH:  04/07/1961  DATE OF PROCEDURE:  01/21/2013  PREOPERATIVE DIAGNOSIS: Screening colonoscopy.   POSTOPERATIVE DIAGNOSIS:   Screening colonoscopy.  PROCEDURE: Colonoscopy.   SURGEON:  Rochel Brome, M.D.   ANESTHESIA: General.   PROCEDURE IN DETAIL:  The patient was placed on the stretcher in the left lateral decubitus position. She was monitored and sedated by the anesthesia staff. Digital anorectal exam demonstrated no palpable mass.   The Olympus video colonoscope was inserted and manipulated around to the ascending colon. There was marked redundancy of the colon as I could see the impression of the scope more proximally in the ascending colon.  The colon preparation was good. Some bilious feculent material was aspirated. Visibility was good. The scope was gradually pulled back seeing the colonic mucosa which had the typical appearance and no polyps or tumors were seen. The rectum appeared normal. There is minimal enlargement of internal hemorrhoids and the scope was removed. The patient tolerated the procedure satisfactorily and was prepared for transfer to the recovery room.    ____________________________ Lenna Sciara. Rochel Brome, MD jws:ct D: 01/21/2013 08:48:29 ET T: 01/21/2013 09:59:04 ET JOB#: [id number]  cc: Loreli Dollar, MD, <Dictator> Loreli Dollar MD ELECTRONICALLY SIGNED 01/22/2013 17:58

## 2015-12-11 ENCOUNTER — Encounter: Payer: Self-pay | Admitting: Emergency Medicine

## 2015-12-11 ENCOUNTER — Ambulatory Visit
Admission: EM | Admit: 2015-12-11 | Discharge: 2015-12-11 | Disposition: A | Discharge status: Home / Self Care | Payer: 59 | Attending: Family Medicine | Admitting: Family Medicine

## 2015-12-11 DIAGNOSIS — J0100 Acute maxillary sinusitis, unspecified: Secondary | ICD-10-CM

## 2015-12-11 MED ORDER — DOXYCYCLINE HYCLATE 100 MG PO CAPS: 100.0000 mg | ORAL_CAPSULE | Freq: Two times a day (BID) | ORAL | Status: DC

## 2015-12-11 NOTE — ED Notes (Signed)
Cough, congestion, runny nose, sinus pressure, sneezing, fever, post nasal drainage (brown sputum) for 5 days

## 2015-12-11 NOTE — Discharge Instructions (Signed)
Take medication as prescribed. Rest. Take over the counter tylenol or ibuprofen as needed for pain or fevers. Drink plenty of fluids.   Follow up closely with your primary care physician this week as needed. Return to Urgent care for new or worsening concerns.   Sinusitis, Adult Sinusitis is redness, soreness, and inflammation of the paranasal sinuses. Paranasal sinuses are air pockets within the bones of your face. They are located beneath your eyes, in the middle of your forehead, and above your eyes. In healthy paranasal sinuses, mucus is able to drain out, and air is able to circulate through them by way of your nose. However, when your paranasal sinuses are inflamed, mucus and air can become trapped. This can allow bacteria and other germs to grow and cause infection. Sinusitis can develop quickly and last only a short time (acute) or continue over a long period (chronic). Sinusitis that lasts for more than 12 weeks is considered chronic. CAUSES Causes of sinusitis include:  Allergies.  Structural abnormalities, such as displacement of the cartilage that separates your nostrils (deviated septum), which can decrease the air flow through your nose and sinuses and affect sinus drainage.  Functional abnormalities, such as when the small hairs (cilia) that line your sinuses and help remove mucus do not work properly or are not present. SIGNS AND SYMPTOMS Symptoms of acute and chronic sinusitis are the same. The primary symptoms are pain and pressure around the affected sinuses. Other symptoms include:  Upper toothache.  Earache.  Headache.  Bad breath.  Decreased sense of smell and taste.  A cough, which worsens when you are lying flat.  Fatigue.  Fever.  Thick drainage from your nose, which often is green and may contain pus (purulent).  Swelling and warmth over the affected sinuses. DIAGNOSIS Your health care provider will perform a physical exam. During your exam, your health  care provider may perform any of the following to help determine if you have acute sinusitis or chronic sinusitis:  Look in your nose for signs of abnormal growths in your nostrils (nasal polyps).  Tap over the affected sinus to check for signs of infection.  View the inside of your sinuses using an imaging device that has a light attached (endoscope). If your health care provider suspects that you have chronic sinusitis, one or more of the following tests may be recommended:  Allergy tests.  Nasal culture. A sample of mucus is taken from your nose, sent to a lab, and screened for bacteria.  Nasal cytology. A sample of mucus is taken from your nose and examined by your health care provider to determine if your sinusitis is related to an allergy. TREATMENT Most cases of acute sinusitis are related to a viral infection and will resolve on their own within 10 days. Sometimes, medicines are prescribed to help relieve symptoms of both acute and chronic sinusitis. These may include pain medicines, decongestants, nasal steroid sprays, or saline sprays. However, for sinusitis related to a bacterial infection, your health care provider will prescribe antibiotic medicines. These are medicines that will help kill the bacteria causing the infection. Rarely, sinusitis is caused by a fungal infection. In these cases, your health care provider will prescribe antifungal medicine. For some cases of chronic sinusitis, surgery is needed. Generally, these are cases in which sinusitis recurs more than 3 times per year, despite other treatments. HOME CARE INSTRUCTIONS  Drink plenty of water. Water helps thin the mucus so your sinuses can drain more easily.  Use  a humidifier.  Inhale steam 3-4 times a day (for example, sit in the bathroom with the shower running).  Apply a warm, moist washcloth to your face 3-4 times a day, or as directed by your health care provider.  Use saline nasal sprays to help moisten  and clean your sinuses.  Take medicines only as directed by your health care provider.  If you were prescribed either an antibiotic or antifungal medicine, finish it all even if you start to feel better. SEEK IMMEDIATE MEDICAL CARE IF:  You have increasing pain or severe headaches.  You have nausea, vomiting, or drowsiness.  You have swelling around your face.  You have vision problems.  You have a stiff neck.  You have difficulty breathing.   This information is not intended to replace advice given to you by your health care provider. Make sure you discuss any questions you have with your health care provider.   Document Released: 11/12/2005 Document Revised: 12/03/2014 Document Reviewed: 11/27/2011 Elsevier Interactive Patient Education [employer].

## 2015-12-11 NOTE — ED Provider Notes (Signed)
[facility]  ____________________________________________  Time seen: Approximately 9:15 AM  I have reviewed the triage vital signs and the nursing notes.   HISTORY  Chief Complaint Facial Pain   HPI VIANNEY COSTABILE is a 55 y.o. female presents for complaints of 5-6 days of runny nose, nasal congestion, sinus pressure and sinus drainage. Reports current sinus discomfort is described as pressure aching at 2/10. Reports intermittent sore throat and cough. 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 States congestion has gradually worsened and now with sinus pressure. Reports last night felt like she had a low grade fever, this morning temp was 101.3 orally.   States occasional cough with feeling drainage in back of throat. Denies chest pain, shortness of breath, dizziness, weakness, vision changes, rash or other complaints. Reports continues to eat and drink well. Reports symptoms unrelieved with over the counter cough and congestion medications.    Past Medical History  Diagnosis Date  . Asthma   . Osteoporosis   . Cancer [facility])     Breast  . Cataract     Patient Active Problem List   Diagnosis Date Noted  . Screening for cervical cancer 09/25/2011  . History of breast cancer 09/25/2011    Past Surgical History  Procedure Laterality Date  . Breast surgery      Current Outpatient Rx  Name  Route  Sig  Dispense  Refill  . Calcium Carbonate-Vitamin D (CALCIUM + D) 600-200 MG-UNIT TABS   Oral   Take by mouth.           . Multiple Vitamin (MULTIVITAMIN) tablet   Oral   Take 1 tablet by mouth daily.           . Cranberry 1000 MG CAPS   Oral   Take by mouth.                      .           . vitamin E (VITAMIN E) 400 UNIT capsule   Oral   Take 400 Units by mouth daily.              Allergies Celebrex; Clindamycin hcl; Loratadine; Penicillins; and Sulfa antibiotics  Family History  Problem Relation Age of Onset  . Cancer Mother 19    Breast, now bilateral    Social History Social History  Substance Use Topics  . Smoking status: Never Smoker   . Smokeless tobacco: Never Used  . Alcohol Use: No    Review of Systems Constitutional: Positive fevers. Eyes: No visual changes. ENT: No sore throat. Positive runny nose, nasal congestion, sinus pressure. Cardiovascular: Denies chest pain. Respiratory: Denies shortness of breath. Gastrointestinal: No abdominal pain.  No nausea, no vomiting.  No diarrhea.  No constipation. Genitourinary: Negative for dysuria. Musculoskeletal: Negative for back pain. Skin: Negative for rash. Neurological: Negative for headaches, focal weakness or numbness.  10-point ROS otherwise negative.  ____________________________________________   PHYSICAL EXAM:  VITAL SIGNS: ED Triage Vitals  Enc Vitals Group     BP 12/11/15 0901 133/74 mmHg     Pulse Rate 12/11/15 0901 108 Recheck 94     Resp 12/11/15 0901 16     Temp 12/11/15 0901 99.3 F (37.4 C)     Temp Source 12/11/15 0901 Oral  SpO2 12/11/15 0901 96 %     Weight 12/11/15 0901 200 lb (90.719 kg)     Height 12/11/15 0901 5\' 6"  (1.676 m)     Head Cir --      Peak Flow --      Pain Score --      Pain Loc --      Pain Edu? --      Excl. in [place]? --     Constitutional: Alert and oriented. Well appearing and in no acute distress. Eyes: Conjunctivae are normal. PERRL. EOMI. Head: Atraumatic. Mild to moderate tenderness to palpation maxillary sinuses. Mild tenderness to palpation frontal sinuses. No erythema.  Ears: no erythema, normal TMs bilaterally.   Nose: Nasal congestion, nasal turbinate erythema and edema.   Mouth/Throat: Mucous membranes are moist.  Oropharynx non-erythematous. No tonsillar swelling or exudate.  Neck: No stridor.  No cervical spine tenderness  to palpation. Hematological/Lymphatic/Immunilogical: No cervical lymphadenopathy. Cardiovascular: Normal rate, regular rhythm. Grossly normal heart sounds.  Good peripheral circulation. Respiratory: Normal respiratory effort. No retractions. Lungs CTAB. No wheezes, rales or rhonchi. Dry occasional cough in room.  Gastrointestinal: Soft and nontender.  Musculoskeletal: No lower or upper extremity tenderness nor edema.  No calf tenderness bilaterally. Bilateral pedal pulses equal and easily palpated.  Neurologic:  Normal speech and language. No gross focal neurologic deficits are appreciated. No gait instability. Skin:  Skin is warm, dry and intact. No rash noted. Psychiatric: Mood and affect are normal. Speech and behavior are normal.  ____________________________________________   LABS (all labs ordered are listed, but only abnormal results are displayed)  Labs Reviewed - No data to display   INITIAL IMPRESSION / ASSESSMENT AND PLAN / ED COURSE  Pertinent labs & imaging results that were available during my care of the patient were reviewed by me and considered in my medical decision making (see chart for details).  Very well-appearing patient. No acute distress. Presents for complaint of runny nose, nasal congestion, sinus pressure with intermittent fever and occasional cough x 5-6 days. Reports continues to eat and drink well. Lungs clear throughout. Abdomen soft nontender. Will treat sinusitis with oral doxycycline. Encouraged rest, fluids and PCP follow up. Patient reports would continue taking over-the-counter Mucinex as needed.   Discussed follow up with Primary care physician this week. Discussed follow up and return parameters including no resolution or any worsening concerns. Patient verbalized understanding and agreed to plan.   ____________________________________________   FINAL CLINICAL IMPRESSION(S) / ED DIAGNOSES  Final diagnoses:  Acute maxillary sinusitis, recurrence  not specified       Marylene Land, NP 12/11/15 1104

## 2016-01-02 DIAGNOSIS — Z01419 Encounter for gynecological examination (general) (routine) without abnormal findings: Secondary | ICD-10-CM | POA: Diagnosis not present

## 2016-01-02 DIAGNOSIS — D649 Anemia, unspecified: Secondary | ICD-10-CM | POA: Diagnosis not present

## 2016-01-02 DIAGNOSIS — E785 Hyperlipidemia, unspecified: Secondary | ICD-10-CM | POA: Insufficient documentation

## 2016-01-02 DIAGNOSIS — M810 Age-related osteoporosis without current pathological fracture: Secondary | ICD-10-CM | POA: Diagnosis not present

## 2016-01-02 DIAGNOSIS — Z0000 Encounter for general adult medical examination without abnormal findings: Secondary | ICD-10-CM | POA: Diagnosis not present

## 2016-01-03 ENCOUNTER — Other Ambulatory Visit: Payer: Self-pay | Admitting: Internal Medicine

## 2016-01-03 DIAGNOSIS — Z1231 Encounter for screening mammogram for malignant neoplasm of breast: Secondary | ICD-10-CM

## 2016-01-10 ENCOUNTER — Other Ambulatory Visit: Payer: Self-pay | Admitting: Internal Medicine

## 2016-01-10 ENCOUNTER — Ambulatory Visit
Admission: RE | Admit: 2016-01-10 | Discharge: 2016-01-10 | Disposition: A | Discharge status: Home / Self Care | Payer: 59 | Source: Ambulatory Visit | Attending: Internal Medicine | Admitting: Internal Medicine

## 2016-01-10 DIAGNOSIS — Z1231 Encounter for screening mammogram for malignant neoplasm of breast: Secondary | ICD-10-CM | POA: Diagnosis not present

## 2016-01-10 IMAGING — MG MM SCREENING BREAST TOMO BILATERAL
8 of 12 series · 8 of 28 positions shown · non-contrast
Comparison: Previous exam(s).

CLINICAL DATA: Screening.

EXAM:
DIGITAL SCREENING BILATERAL MAMMOGRAM WITH 3D TOMO WITH CAD

[L CC]
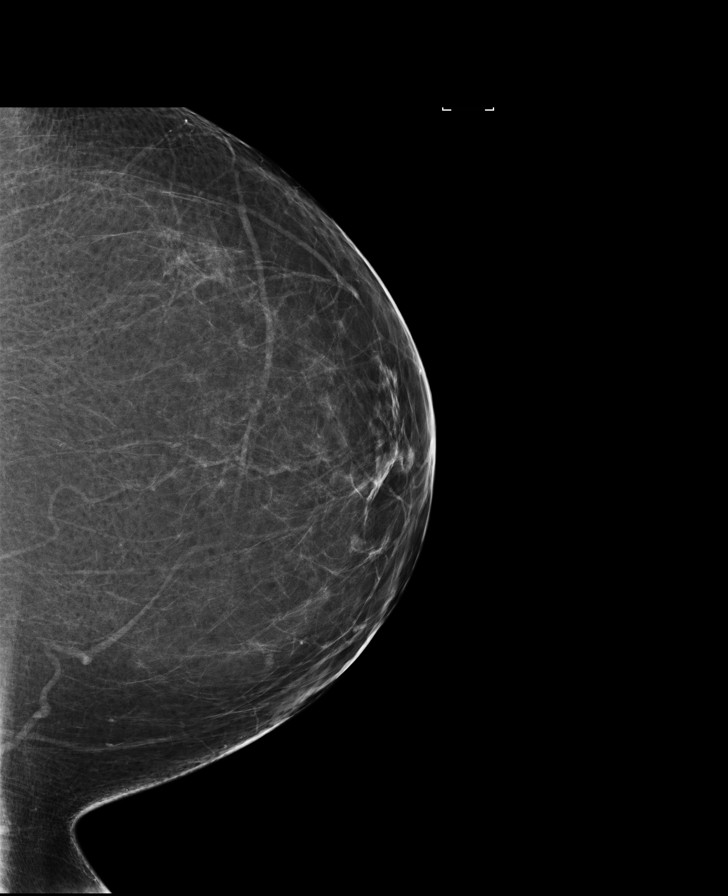

[R MLO synth-2D]
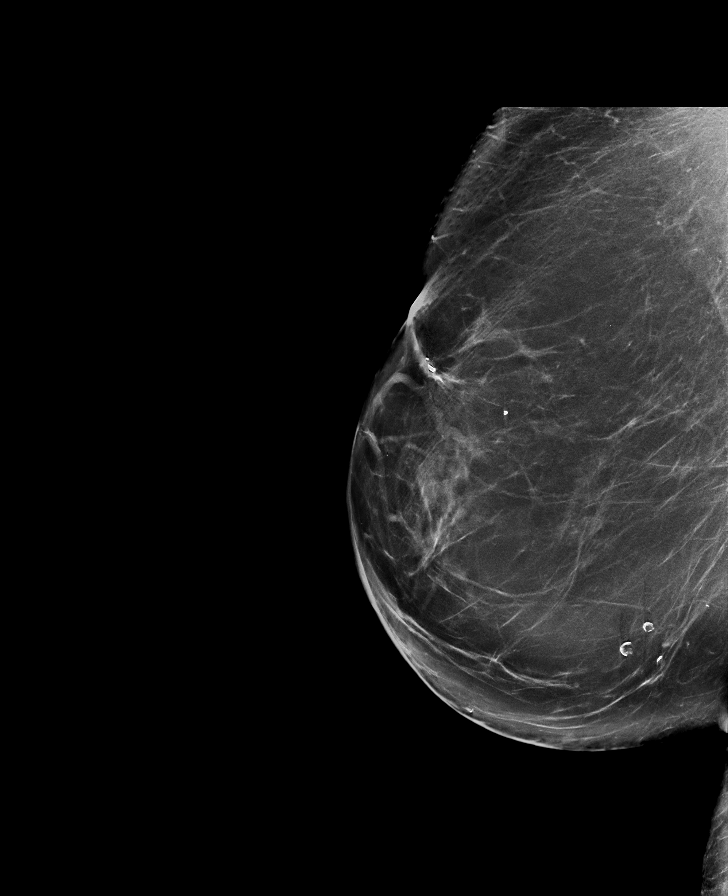

[R MLO]
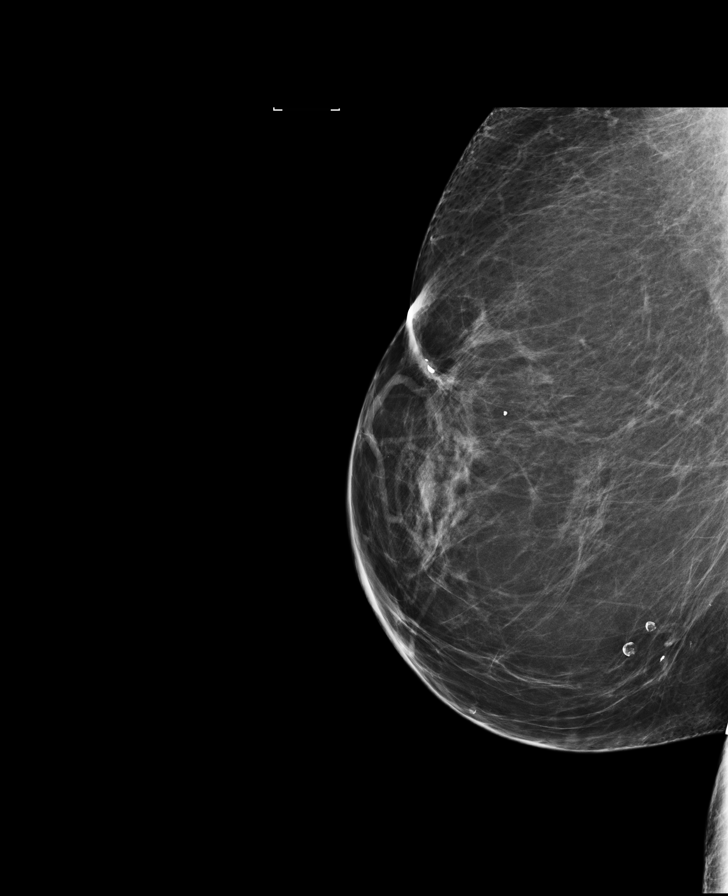

[L MLO synth-2D]
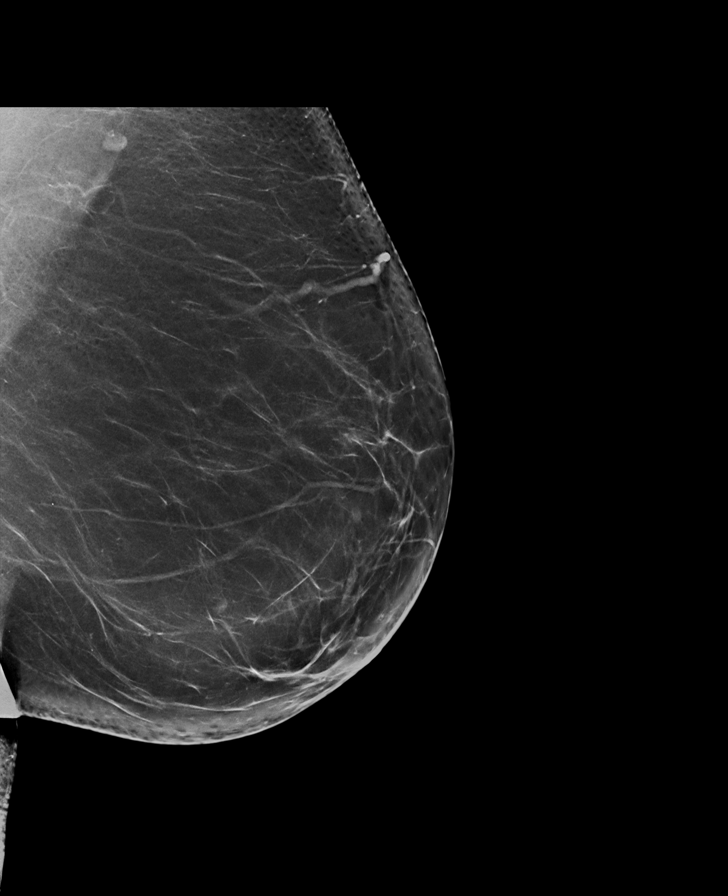

[L CC synth-2D]
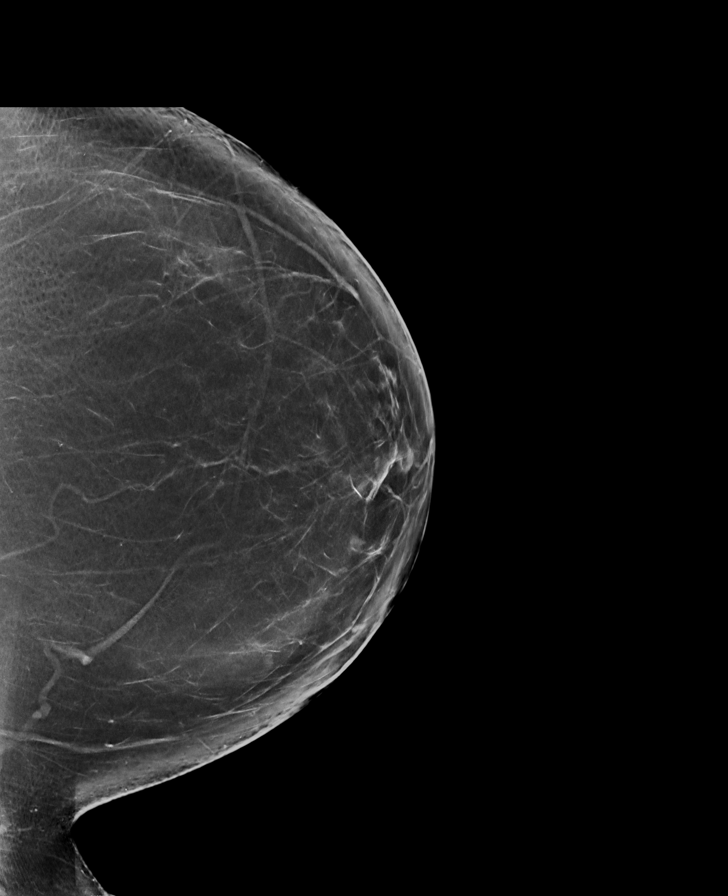

[R CC]
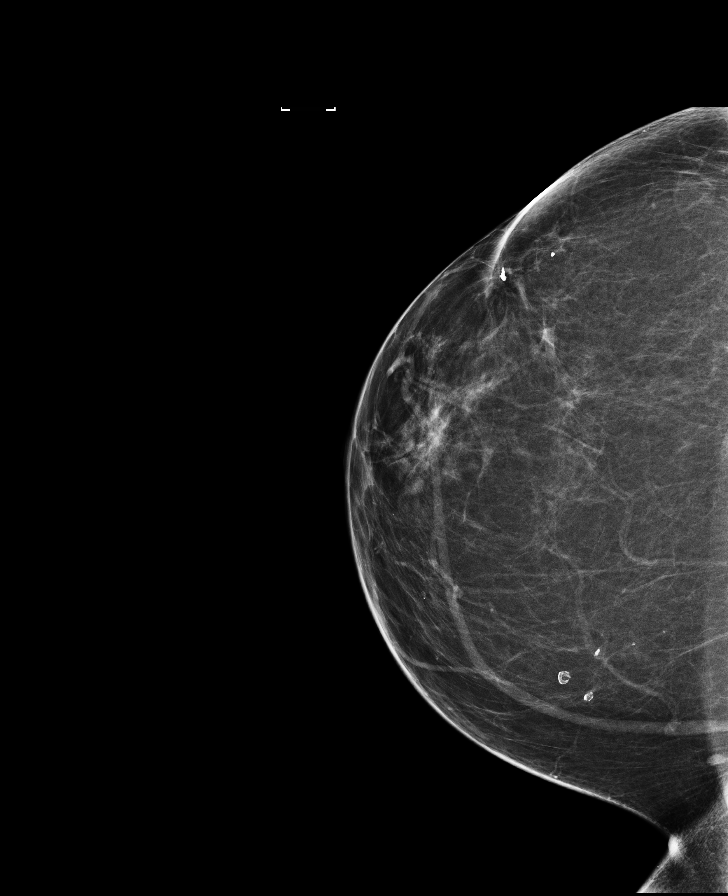

[L MLO]
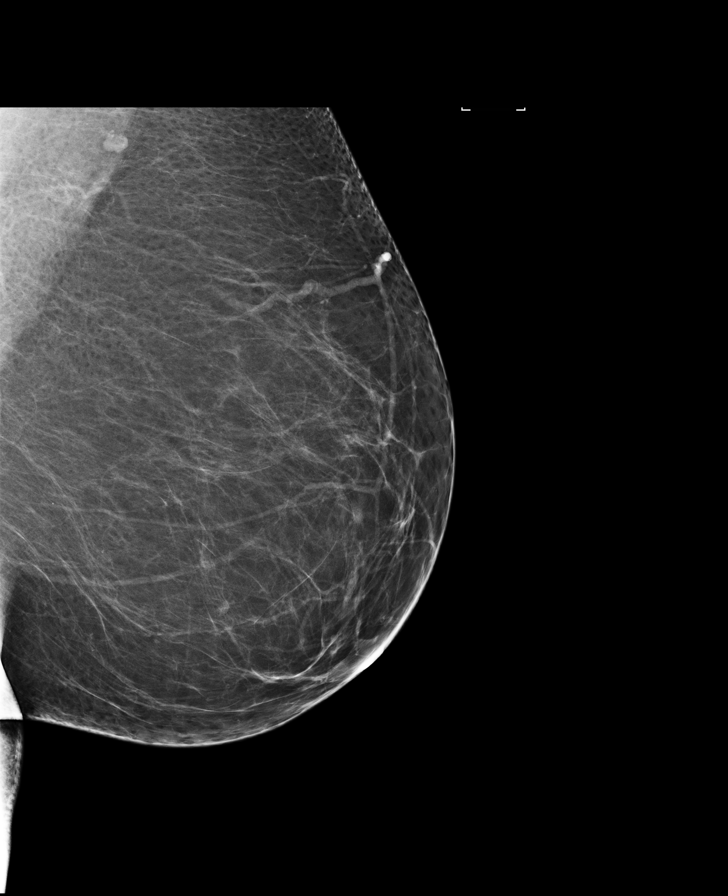

[R CC synth-2D]
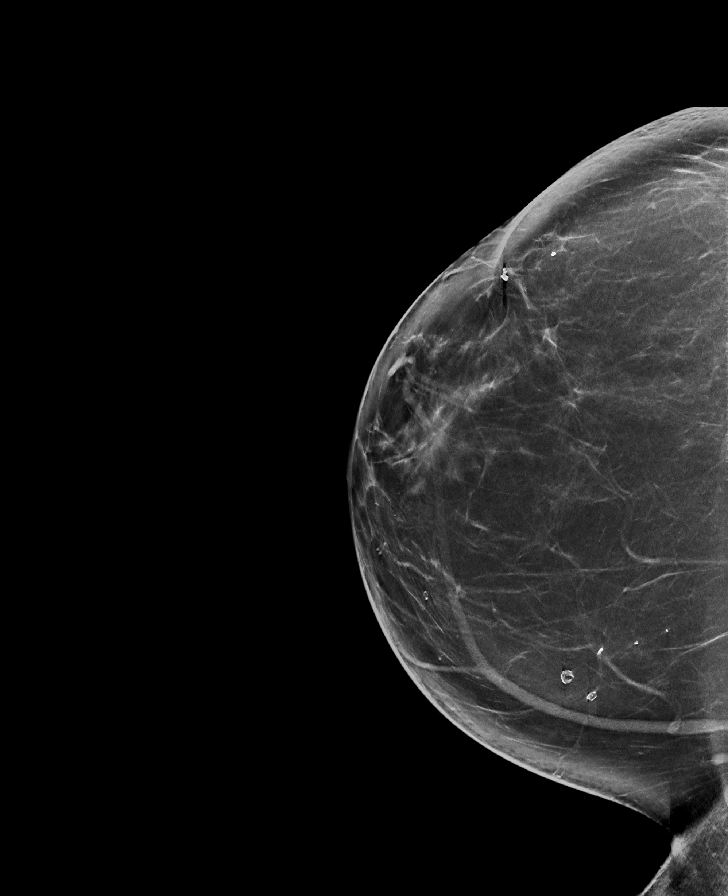

[8 of 28 positions shown; findings below may reference images not displayed]

ACR Breast Density Category b: There are scattered areas of
fibroglandular density.
FINDINGS: There are no findings suspicious for malignancy. Images were
processed with CAD.
IMPRESSION: No mammographic evidence of malignancy. A result letter of this
screening mammogram will be mailed directly to the patient.

RECOMMENDATION:
Screening mammogram in one year. (Code:[F0])

BI-RADS CATEGORY  1: Negative.

## 2016-01-12 DIAGNOSIS — Z79899 Other long term (current) drug therapy: Secondary | ICD-10-CM | POA: Diagnosis not present

## 2016-01-12 DIAGNOSIS — E785 Hyperlipidemia, unspecified: Secondary | ICD-10-CM | POA: Diagnosis not present

## 2016-01-12 DIAGNOSIS — E559 Vitamin D deficiency, unspecified: Secondary | ICD-10-CM | POA: Diagnosis not present

## 2016-09-24 ENCOUNTER — Encounter: Payer: Self-pay | Admitting: Physician Assistant

## 2016-09-24 ENCOUNTER — Ambulatory Visit: Payer: Self-pay | Admitting: Physician Assistant

## 2016-09-24 VITALS — BP 122/86 | HR 62 | Temp 98.4°F

## 2016-09-24 DIAGNOSIS — W57XXXA Bitten or stung by nonvenomous insect and other nonvenomous arthropods, initial encounter: Secondary | ICD-10-CM

## 2016-09-24 NOTE — Progress Notes (Signed)
S: c/o bug bite to r hip, happened on Saturday, had a stinging sensation in hip, area was small and red then it has gotten bigger, no fever/chills, no dif breathing, used hydrocortisone cream  O: vitals wnl, nad, skin with quarter sized red area, no induration or fluctuance, a little warm but not hot, no drainage, pustules or vesicles, no puncture wound marks, n/v intact  A: insect bite  P: otc benadryl cream, hydrocortisone cream, ice, if worsening return to clinic

## 2016-12-04 ENCOUNTER — Other Ambulatory Visit: Payer: Self-pay | Admitting: Internal Medicine

## 2016-12-04 DIAGNOSIS — Z1231 Encounter for screening mammogram for malignant neoplasm of breast: Secondary | ICD-10-CM

## 2017-01-02 DIAGNOSIS — E785 Hyperlipidemia, unspecified: Secondary | ICD-10-CM | POA: Diagnosis not present

## 2017-01-02 DIAGNOSIS — Z0000 Encounter for general adult medical examination without abnormal findings: Secondary | ICD-10-CM | POA: Diagnosis not present

## 2017-01-02 DIAGNOSIS — D649 Anemia, unspecified: Secondary | ICD-10-CM | POA: Diagnosis not present

## 2017-01-02 DIAGNOSIS — M810 Age-related osteoporosis without current pathological fracture: Secondary | ICD-10-CM | POA: Diagnosis not present

## 2017-01-15 ENCOUNTER — Ambulatory Visit
Admission: RE | Admit: 2017-01-15 | Discharge: 2017-01-15 | Disposition: A | Discharge status: Home / Self Care | Payer: 59 | Source: Ambulatory Visit | Attending: Internal Medicine | Admitting: Internal Medicine

## 2017-01-15 DIAGNOSIS — N63 Unspecified lump in unspecified breast: Secondary | ICD-10-CM | POA: Insufficient documentation

## 2017-01-15 DIAGNOSIS — R928 Other abnormal and inconclusive findings on diagnostic imaging of breast: Secondary | ICD-10-CM | POA: Diagnosis not present

## 2017-01-15 DIAGNOSIS — Z1231 Encounter for screening mammogram for malignant neoplasm of breast: Secondary | ICD-10-CM | POA: Diagnosis not present

## 2017-01-15 HISTORY — DX: Personal history of antineoplastic chemotherapy: Z92.21

## 2017-01-15 HISTORY — DX: Personal history of irradiation: Z92.3

## 2017-01-15 IMAGING — MG MM DIGITAL SCREENING BILAT W/ TOMO W/ CAD
8 of 15 series · 8 of 31 positions shown · non-contrast
Comparison: Previous exam(s).

CLINICAL DATA: Screening.

EXAM:
2D DIGITAL SCREENING BILATERAL MAMMOGRAM WITH CAD AND ADJUNCT TOMO

[R MLO (1 of 2)]
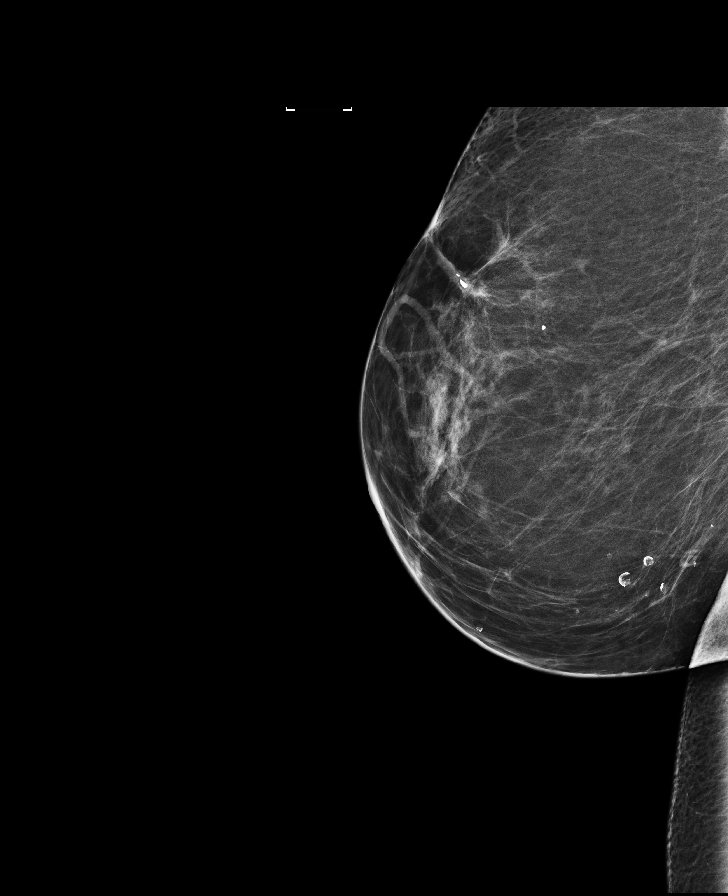

[L MLO synth-2D]
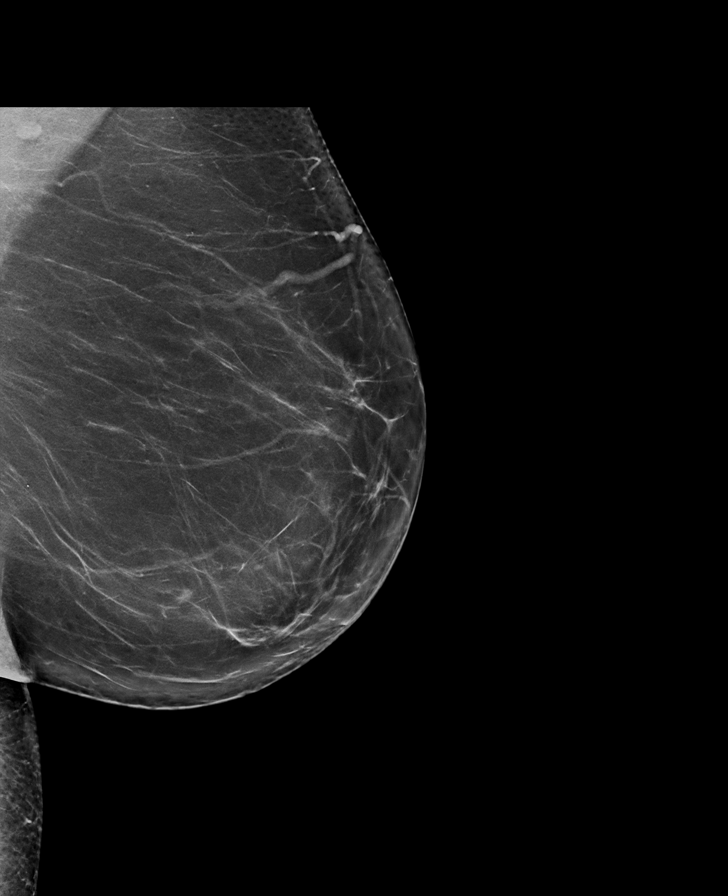

[R MLO synth-2D]
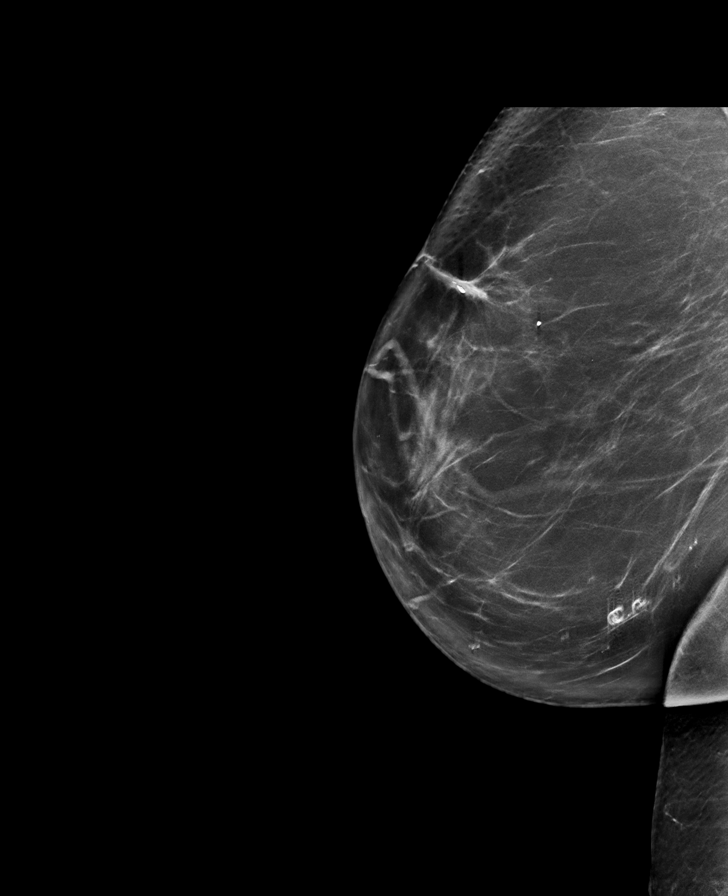

[R MLO (2 of 2)]
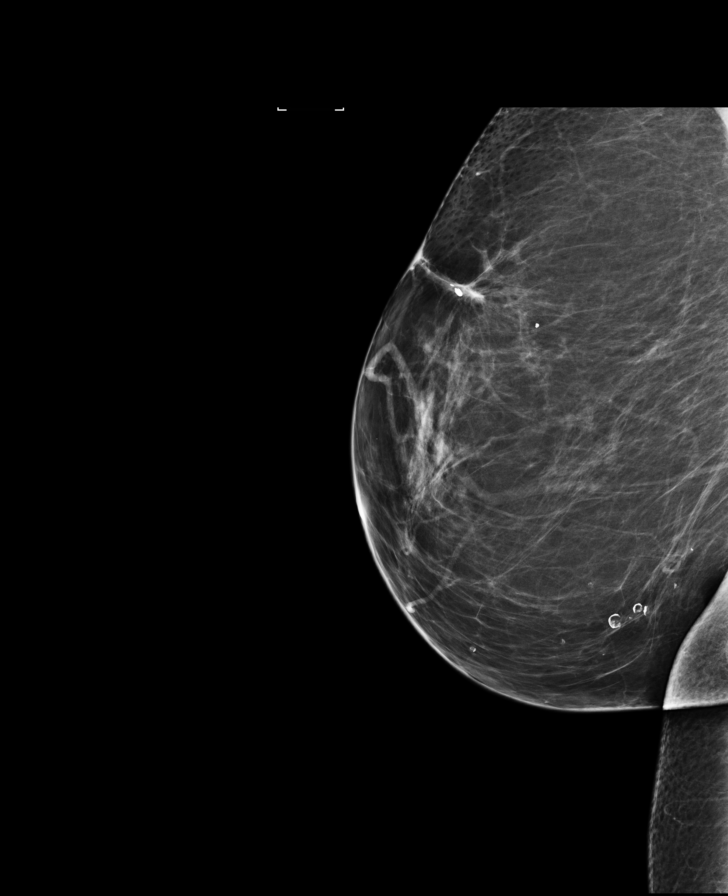

[L MLO]
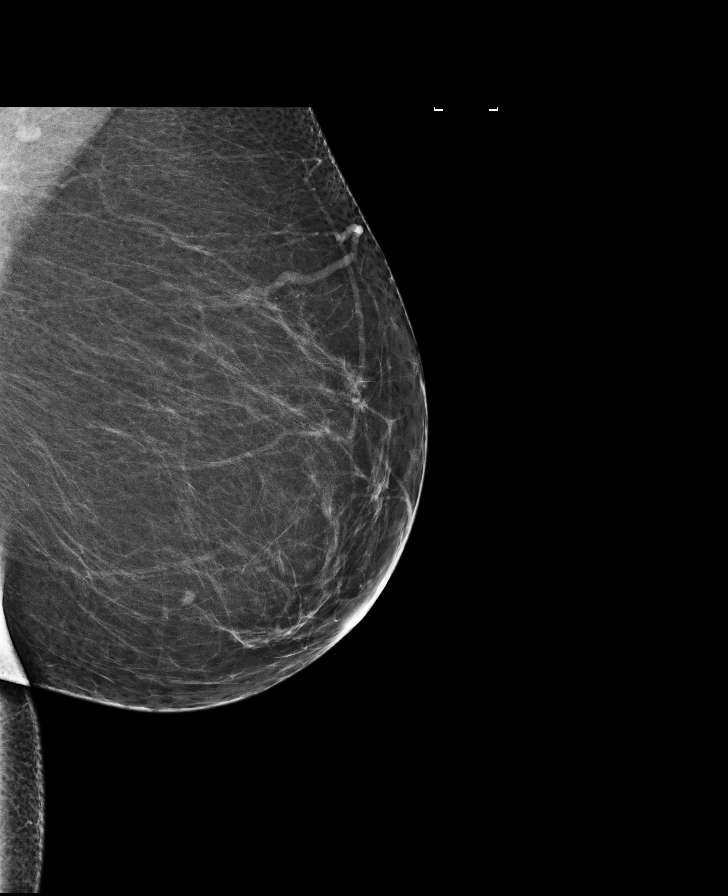

[R CC synth-2D]
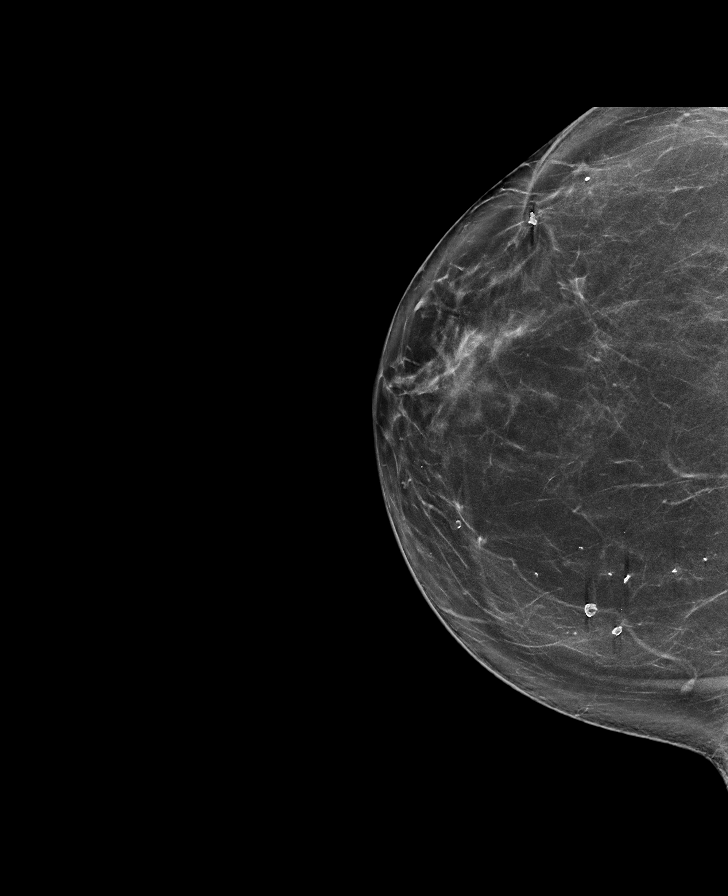

[R CC]
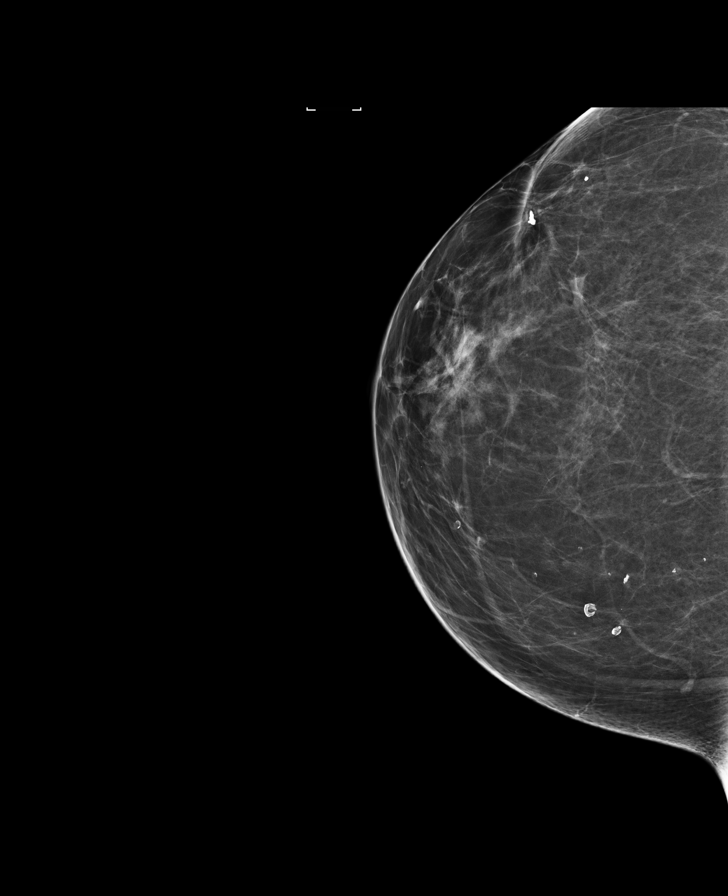

[L CC]
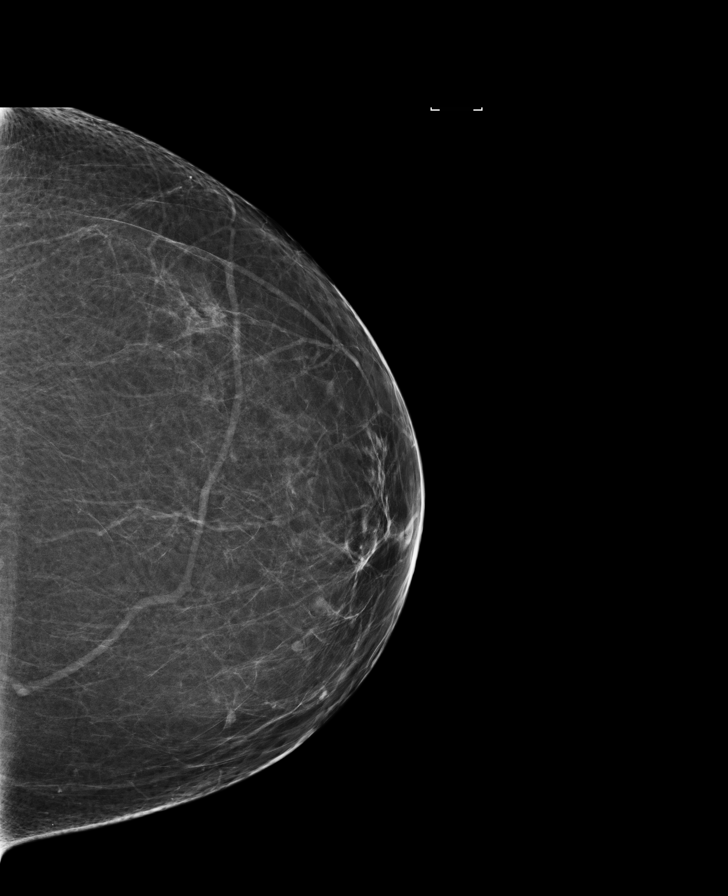

[8 of 31 positions shown; findings below may reference images not displayed]

ACR Breast Density Category b: There are scattered areas of
fibroglandular density.
FINDINGS: In the left breast, a possible mass warrants further evaluation. In
the right breast, no findings suspicious for malignancy.

Images were processed with CAD.
IMPRESSION: Further evaluation is suggested for possible mass in the left
breast.

RECOMMENDATION:
Diagnostic mammogram and possibly ultrasound of the left breast.
(Code:[WX])

The patient will be contacted regarding the findings, and additional
imaging will be scheduled.

BI-RADS CATEGORY  0: Incomplete. Need additional imaging evaluation
and/or prior mammograms for comparison.

## 2017-01-17 ENCOUNTER — Other Ambulatory Visit: Payer: Self-pay | Admitting: Internal Medicine

## 2017-01-17 DIAGNOSIS — R928 Other abnormal and inconclusive findings on diagnostic imaging of breast: Secondary | ICD-10-CM

## 2017-01-17 DIAGNOSIS — R1084 Generalized abdominal pain: Secondary | ICD-10-CM | POA: Diagnosis not present

## 2017-01-17 DIAGNOSIS — N6320 Unspecified lump in the left breast, unspecified quadrant: Secondary | ICD-10-CM

## 2017-01-17 DIAGNOSIS — Z79899 Other long term (current) drug therapy: Secondary | ICD-10-CM | POA: Diagnosis not present

## 2017-01-17 DIAGNOSIS — E785 Hyperlipidemia, unspecified: Secondary | ICD-10-CM | POA: Diagnosis not present

## 2017-01-21 ENCOUNTER — Other Ambulatory Visit: Payer: Self-pay | Admitting: Internal Medicine

## 2017-01-21 DIAGNOSIS — Z13820 Encounter for screening for osteoporosis: Secondary | ICD-10-CM

## 2017-01-22 ENCOUNTER — Ambulatory Visit
Admission: RE | Admit: 2017-01-22 | Discharge: 2017-01-22 | Disposition: A | Discharge status: Home / Self Care | Payer: 59 | Source: Ambulatory Visit | Attending: Internal Medicine | Admitting: Internal Medicine

## 2017-01-22 DIAGNOSIS — N6489 Other specified disorders of breast: Secondary | ICD-10-CM | POA: Diagnosis not present

## 2017-01-22 DIAGNOSIS — R928 Other abnormal and inconclusive findings on diagnostic imaging of breast: Secondary | ICD-10-CM

## 2017-01-22 DIAGNOSIS — N6320 Unspecified lump in the left breast, unspecified quadrant: Secondary | ICD-10-CM

## 2017-01-22 DIAGNOSIS — N6321 Unspecified lump in the left breast, upper outer quadrant: Secondary | ICD-10-CM | POA: Insufficient documentation

## 2017-01-22 IMAGING — MG MM DIGITAL DIAGNOSTIC UNILAT*L* W/ TOMO W/ CAD
9 series · 9 of 21 positions shown · non-contrast
Comparison: Previous exam(s).

CLINICAL DATA: Screening recall for a possible left breast mass.

EXAM:
2D DIGITAL DIAGNOSTIC UNILATERAL LEFT MAMMOGRAM WITH CAD AND ADJUNCT
TOMO
LEFT BREAST ULTRASOUND

[L MLO synth-2D (1 of 2)]
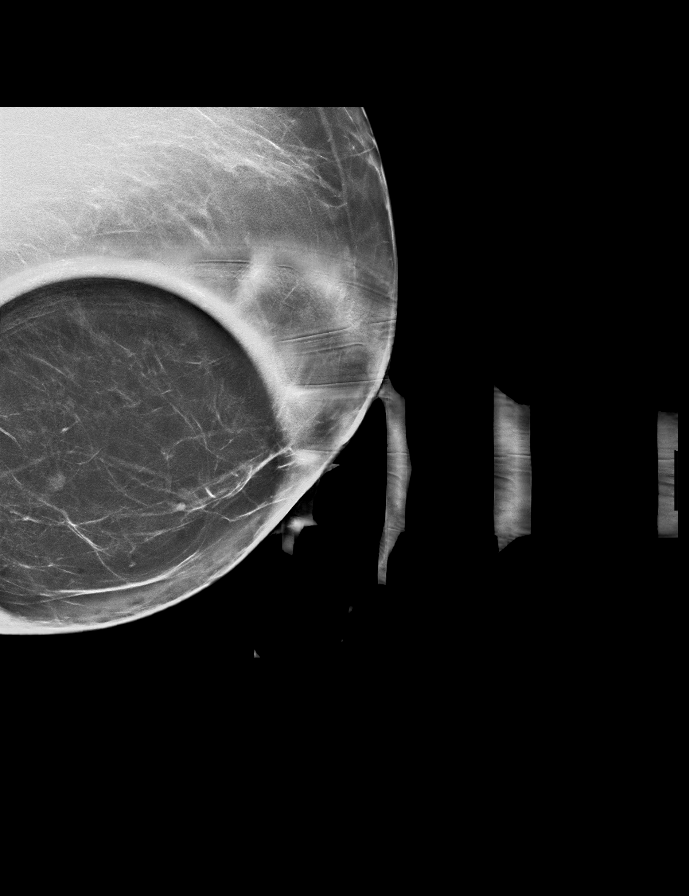

[L CC synth-2D]
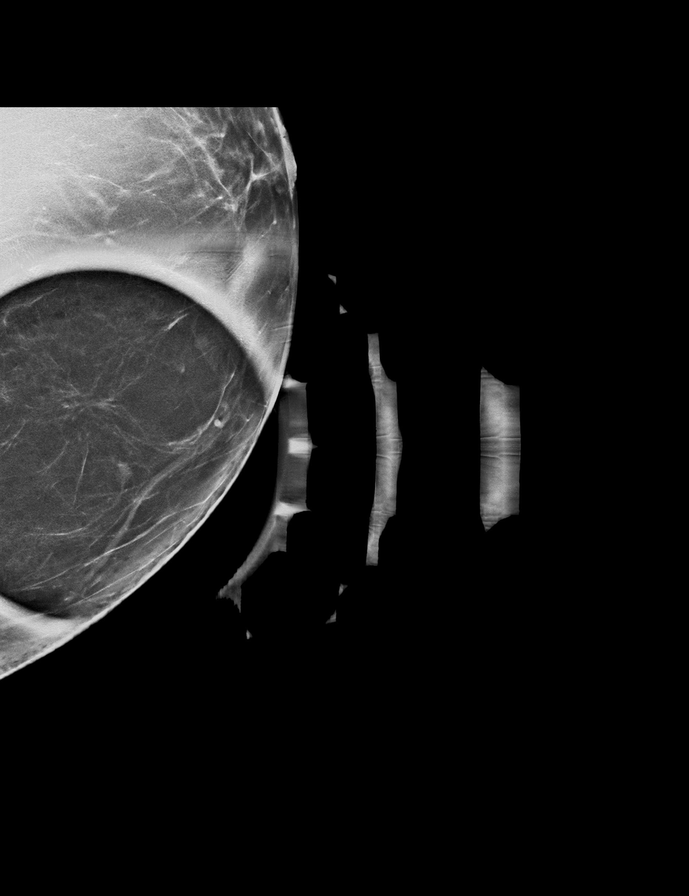

[L MLO (1 of 2)]
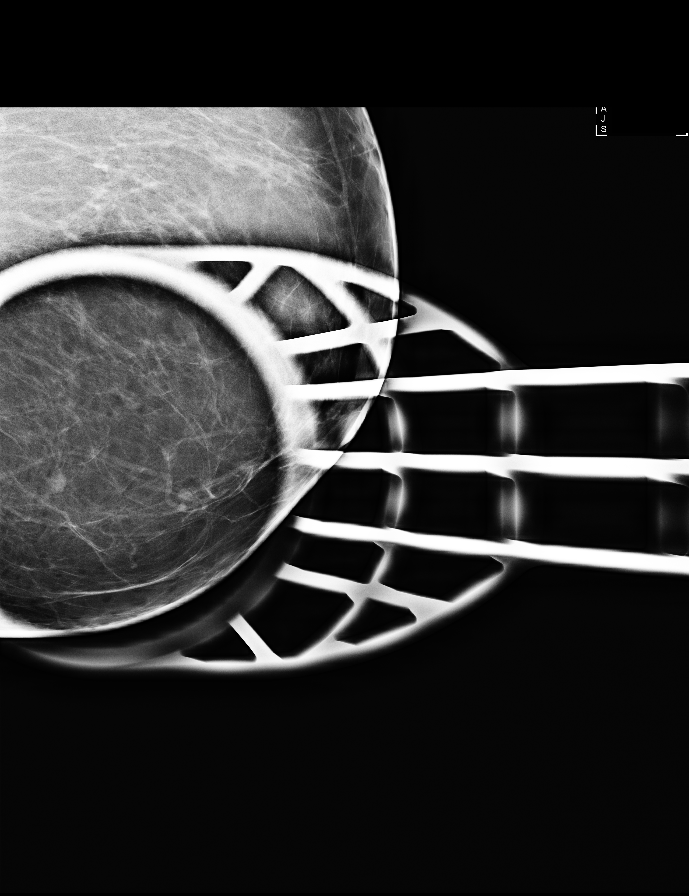

[L CC]
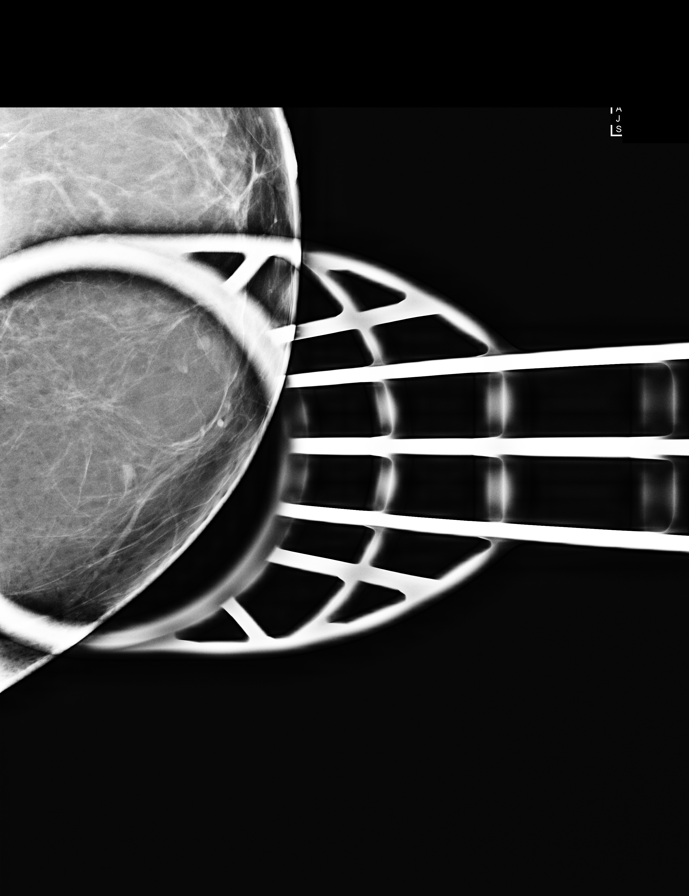

[L MLO (2 of 2)]
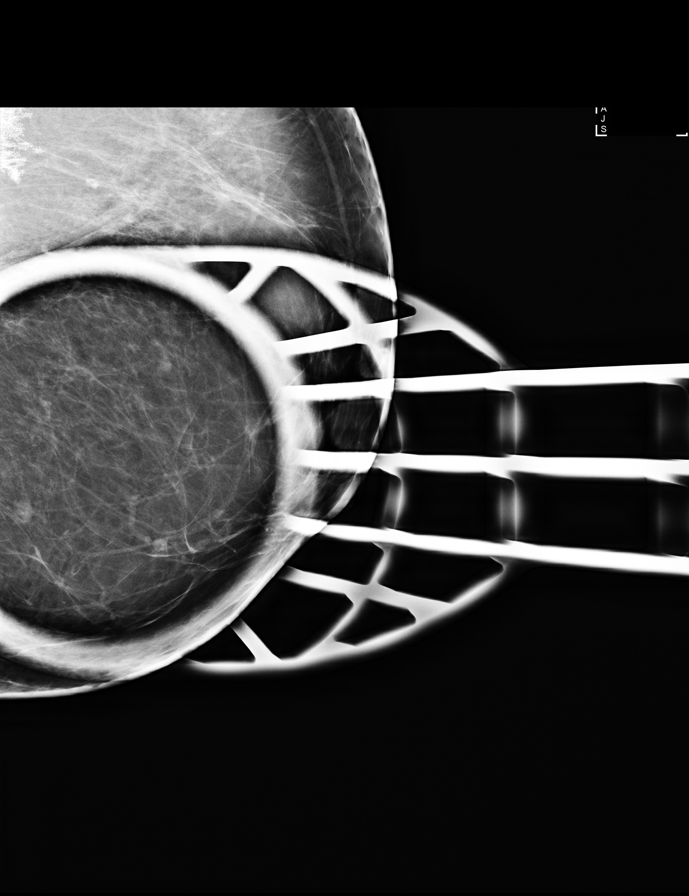

[L MLO synth-2D (2 of 2)]
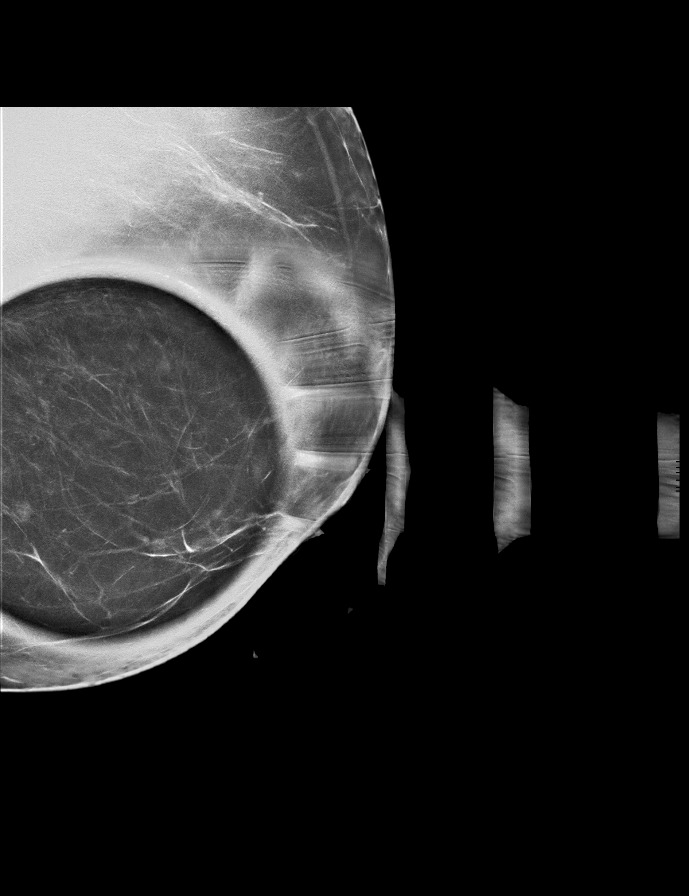

[L CC tomo · tomo slice 29/58.0]
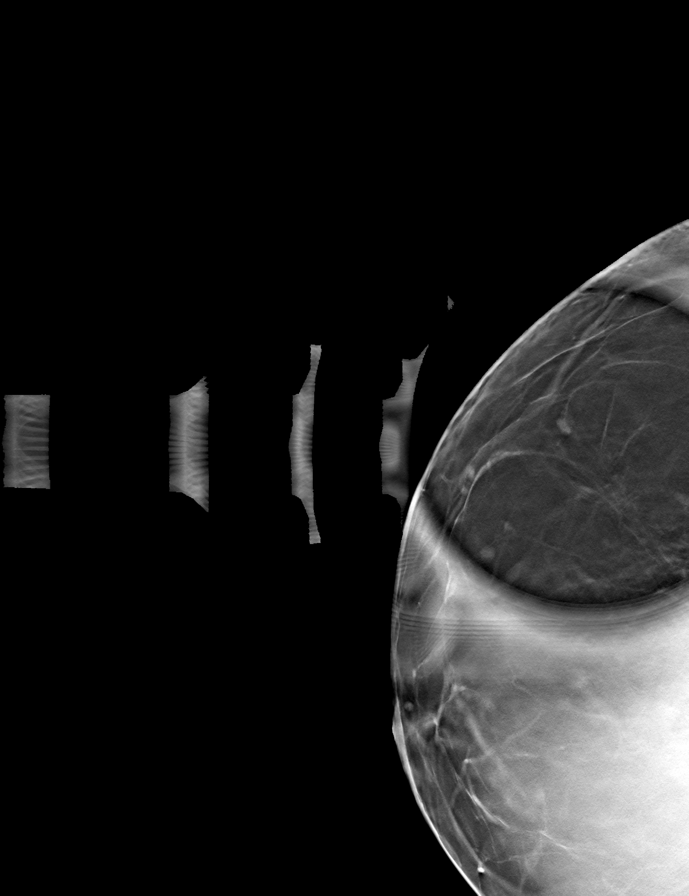

[L MLO tomo (1 of 2) · tomo slice 37/72.0]
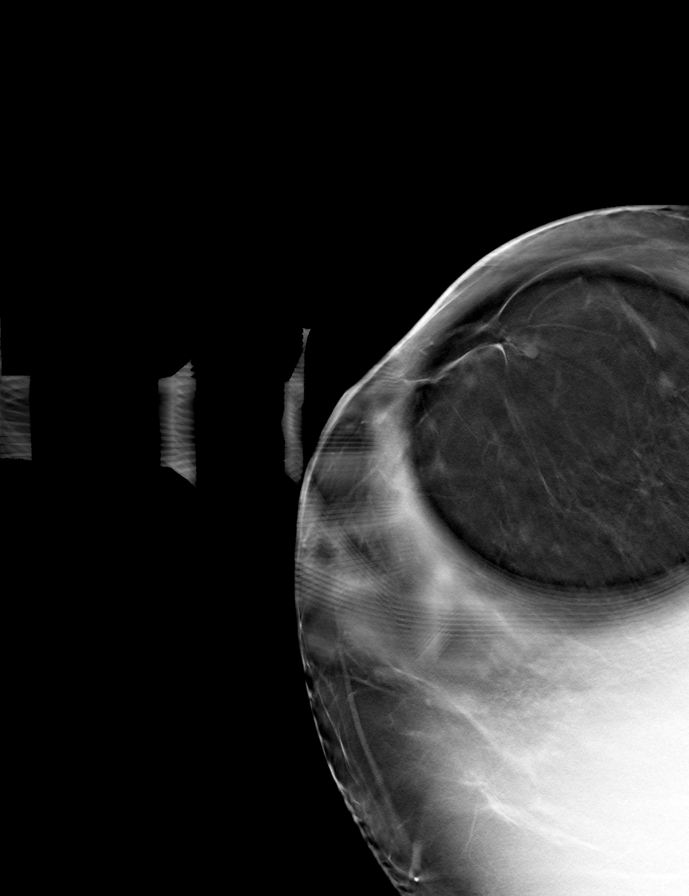

[L MLO tomo (2 of 2) · tomo slice 36/71.0]
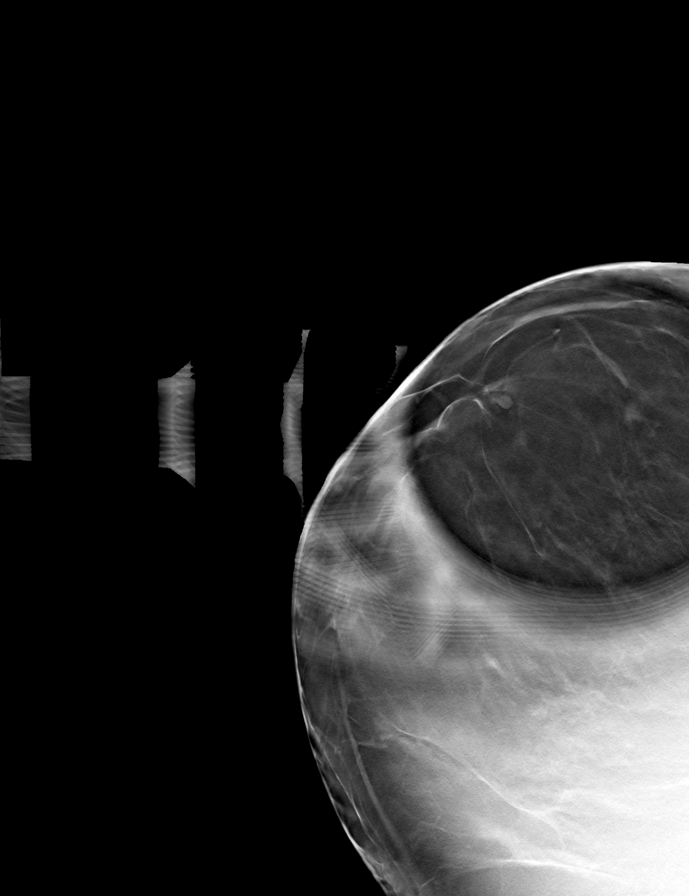

[9 of 21 positions shown; findings below may reference images not displayed]

ACR Breast Density Category b: There are scattered areas of
fibroglandular density.
FINDINGS: In the medial aspect of the left breast, middle depth there is a 5
mm oval mass with indistinct margins.

Mammographic images were processed with CAD.

Ultrasound of the left breast at 9 o'clock, 5 cm from the nipple
demonstrates an anechoic irregular mass measuring 5 x 2 x 6 mm,
favored to represent a cluster of cysts.
IMPRESSION: The mass in the left breast at 9 o'clock is probably benign, favored
to represent fibrocystic change.

RECOMMENDATION:
Six-month follow-up diagnostic left breast mammogram and ultrasound
is recommended.

I have discussed the findings and recommendations with the patient.
Results were also provided in writing at the conclusion of the
visit. If applicable, a reminder letter will be sent to the patient
regarding the next appointment.

BI-RADS CATEGORY  3: Probably benign.

## 2017-01-28 ENCOUNTER — Ambulatory Visit
Admission: RE | Admit: 2017-01-28 | Discharge: 2017-01-28 | Disposition: A | Discharge status: Home / Self Care | Payer: 59 | Source: Ambulatory Visit | Attending: Internal Medicine | Admitting: Internal Medicine

## 2017-01-28 DIAGNOSIS — Z13820 Encounter for screening for osteoporosis: Secondary | ICD-10-CM

## 2017-01-29 ENCOUNTER — Ambulatory Visit (INDEPENDENT_AMBULATORY_CARE_PROVIDER_SITE_OTHER): Payer: 59 | Admitting: Gastroenterology

## 2017-01-29 ENCOUNTER — Other Ambulatory Visit: Payer: Self-pay

## 2017-01-29 ENCOUNTER — Other Ambulatory Visit
Admission: RE | Admit: 2017-01-29 | Discharge: 2017-01-29 | Disposition: A | Discharge status: Home / Self Care | Payer: 59 | Source: Ambulatory Visit | Attending: Gastroenterology | Admitting: Gastroenterology

## 2017-01-29 ENCOUNTER — Telehealth: Payer: Self-pay

## 2017-01-29 ENCOUNTER — Encounter: Payer: Self-pay | Admitting: Gastroenterology

## 2017-01-29 VITALS — BP 126/84 | Ht 67.0 in | Wt 208.6 lb

## 2017-01-29 DIAGNOSIS — R197 Diarrhea, unspecified: Secondary | ICD-10-CM | POA: Insufficient documentation

## 2017-01-29 DIAGNOSIS — R1013 Epigastric pain: Secondary | ICD-10-CM

## 2017-01-29 DIAGNOSIS — R194 Change in bowel habit: Secondary | ICD-10-CM

## 2017-01-29 LAB — C-REACTIVE PROTEIN

## 2017-01-29 NOTE — Progress Notes (Signed)
Gastroenterology Consultation  Referring Provider:     No ref. provider found Primary Care Physician:  Idelle Crouch, MD Primary Gastroenterologist:  Dr. Jonathon Bellows  Reason for Consultation:     Diarrhea         HPI:   Marissa Phillips is a 56 y.o. y/o female referred for consultation & management  by Dr. Idelle Crouch, MD.    She has been referred for abdominal pain. She saw Dr Doy Hutching on 01/02/17 and discussed issues with bloating , early satiety,cramping and diarrhea. Dr Doy Hutching referred her to see [place] for an EGD/Colonoscopy.   Labs 12/2016- CBC,BMP,LFT,TSH-normal   Diarrhea :  Onset: began 1 year back and last epsidoe of diarrhea was in 09/2016. Began at first when she throught it was a food intolerance when she would eat at a particular restaurant.    Number of bowel movements a day : 1-2 a day    Color : regular   Consistency:  Formed  Present status: resolved    Shape of stool:  None    Weight loss:  None  Prior colonoscopy:  Last was 4 years back- Dr Tamala Julian and was told it was tortious- was told to return next year.   Artificial sugars/sodas/chewing gum:  Mostly sweet n low with tea, several sachets a day , atleast 2 sodas a day -diet . Also consumes sweetened tea, quit chewing gum 3-4 months back .    Bloating:  Yes , at times has had distension. Prior to thanksgiving " felt if I popped a pin in my belly, it would burst "  Gas:  Yes  Antibiotic use: doxycycline for an infected sebaceous cyst last in before all her symptoms started.    Presently has no symptoms.  Last 4 months has pretty much not had any symptoms.    Denies any abdominal pain.   She has a history of reflux -a few years < 10 years.       Past Medical History:  Diagnosis Date  . Asthma   . Cancer [facility])    Breast- Right-2005  . Cataract   . Osteoporosis   . Personal history of chemotherapy   . Personal history of radiation therapy     Past Surgical History:  Procedure Laterality Date  .  BREAST BIOPSY Right 2005   +  . BREAST LUMPECTOMY WITH NEEDLE LOCALIZATION AND AXILLARY SENTINEL LYMPH NODE BX Right 2005  . BREAST SURGERY      Prior to Admission medications   Medication Sig Start Date End Date Taking? Authorizing Provider  Calcium Carbonate-Vitamin D (CALCIUM + D) 600-200 MG-UNIT TABS Take by mouth.      Historical Provider, MD  Cranberry 1000 MG CAPS Take by mouth.      Historical Provider, MD  doxycycline (VIBRAMYCIN) 100 MG capsule Take 1 capsule (100 mg total) by mouth 2 (two) times daily. Patient not taking: Reported on 09/24/2016 12/11/15   Marylene Land, NP  Multiple Vitamin (MULTIVITAMIN) tablet Take 1 tablet by mouth daily.      Historical Provider, MD  venlafaxine (EFFEXOR) 37.5 MG tablet Take 1 tablet (37.5 mg total) by mouth every other day. 08/15/12 11/13/12  Crecencio Mc, MD  vitamin E (VITAMIN E) 400 UNIT capsule Take 400 Units by mouth daily.      Historical Provider, MD    Family History  Problem Relation Age of Onset  . Cancer Mother 74    Breast, now bilateral  . Breast cancer Mother  55     Social History  Substance Use Topics  . Smoking status: Never Smoker  . Smokeless tobacco: Never Used  . Alcohol use No    Allergies as of 01/29/2017 - Review Complete 09/24/2016  Allergen Reaction Noted  . Celebrex [celecoxib]  09/24/2011  . Clindamycin hcl  09/25/2011  . Loratadine  09/24/2011  . Penicillins  09/24/2011  . Sulfa antibiotics  09/24/2011    Review of Systems:    All systems reviewed and negative except where noted in HPI.   Physical Exam:  BP 126/84   Ht 5\' 7"  (1.702 m)   Wt 208 lb 9.6 oz (94.6 kg)   BMI 32.67 kg/m  No LMP recorded. Patient is not currently having periods (Reason: Other). Psych:  Alert and cooperative. Normal mood and affect. General:   Alert,  Well-developed, well-nourished, pleasant and cooperative in NAD Head:  Normocephalic and atraumatic. Eyes:  Sclera clear, no icterus.   Conjunctiva pink. Ears:   Normal auditory acuity. Nose:  No deformity, discharge, or lesions. Mouth:  No deformity or lesions,oropharynx pink & moist. Neck:  Supple; no masses or thyromegaly. Lungs:  Respirations even and unlabored.  Clear throughout to auscultation.   No wheezes, crackles, or rhonchi. No acute distress. Heart:  Regular rate and rhythm; no murmurs, clicks, rubs, or gallops. Abdomen:  Normal bowel sounds.  No bruits.  Soft, non-tender and non-distended without masses, hepatosplenomegaly or hernias noted.  No guarding or rebound tenderness.    Msk:  Symmetrical without gross deformities. Good, equal movement & strength bilaterally. Pulses:  Normal pulses noted. Extremities:  No clubbing or edema.  No cyanosis. Neurologic:  Alert and oriented x3;  grossly normal neurologically. Skin:  Intact without significant lesions or rashes. No jaundice. Lymph Nodes:  No significant cervical adenopathy. Psych:  Alert and cooperative. Normal mood and affect.  Imaging Studies: US Breast Ltd Uni Left Inc Axilla  Result Date: 01/22/2017 CLINICAL DATA:  Screening recall for a possible left breast mass. EXAM: 2D DIGITAL DIAGNOSTIC UNILATERAL LEFT MAMMOGRAM WITH CAD AND ADJUNCT TOMO LEFT BREAST ULTRASOUND COMPARISON:  Previous exam(s). ACR Breast Density Category b: There are scattered areas of fibroglandular density. FINDINGS: In the medial aspect of the left breast, middle depth there is a 5 mm oval mass with indistinct margins. Mammographic images were processed with CAD. Ultrasound of the left breast at 9 o'clock, 5 cm from the nipple demonstrates an anechoic irregular mass measuring 5 x 2 x 6 mm, favored to represent a cluster of cysts. IMPRESSION: The mass in the left breast at 9 o'clock is probably benign, favored to represent fibrocystic change. RECOMMENDATION: Six-month follow-up diagnostic left breast mammogram and ultrasound is recommended. I have discussed the findings and recommendations with the patient. Results  were also provided in writing at the conclusion of the visit. If applicable, a reminder letter will be sent to the patient regarding the next appointment. BI-RADS CATEGORY  3: Probably benign. Electronically Signed   By: Ammie Ferrier M.D.   On: 01/22/2017 11:28   Mm Diag Breast Tomo Uni Left  Result Date: 01/22/2017 CLINICAL DATA:  Screening recall for a possible left breast mass. EXAM: 2D DIGITAL DIAGNOSTIC UNILATERAL LEFT MAMMOGRAM WITH CAD AND ADJUNCT TOMO LEFT BREAST ULTRASOUND COMPARISON:  Previous exam(s). ACR Breast Density Category b: There are scattered areas of fibroglandular density. FINDINGS: In the medial aspect of the left breast, middle depth there is a 5 mm oval mass with indistinct margins. Mammographic images were processed with  CAD. Ultrasound of the left breast at 9 o'clock, 5 cm from the nipple demonstrates an anechoic irregular mass measuring 5 x 2 x 6 mm, favored to represent a cluster of cysts. IMPRESSION: The mass in the left breast at 9 o'clock is probably benign, favored to represent fibrocystic change. RECOMMENDATION: Six-month follow-up diagnostic left breast mammogram and ultrasound is recommended. I have discussed the findings and recommendations with the patient. Results were also provided in writing at the conclusion of the visit. If applicable, a reminder letter will be sent to the patient regarding the next appointment. BI-RADS CATEGORY  3: Probably benign. Electronically Signed   By: Ammie Ferrier M.D.   On: 01/22/2017 11:28   Mm Screening Breast Tomo Bilateral  Result Date: 01/15/2017 CLINICAL DATA:  Screening. EXAM: 2D DIGITAL SCREENING BILATERAL MAMMOGRAM WITH CAD AND ADJUNCT TOMO COMPARISON:  Previous exam(s). ACR Breast Density Category b: There are scattered areas of fibroglandular density. FINDINGS: In the left breast, a possible mass warrants further evaluation. In the right breast, no findings suspicious for malignancy. Images were processed with CAD.  IMPRESSION: Further evaluation is suggested for possible mass in the left breast. RECOMMENDATION: Diagnostic mammogram and possibly ultrasound of the left breast. (Code:[id number]) The patient will be contacted regarding the findings, and additional imaging will be scheduled. BI-RADS CATEGORY  0: Incomplete. Need additional imaging evaluation and/or prior mammograms for comparison. Electronically Signed   By: Ammie Ferrier M.D.   On: 01/15/2017 15:57    Assessment and Plan:   Marissa Phillips is a 56 y.o. y/o female has been referred for abdominal discomfort, bloating, diarrhea. Based on her history it is very likely she has osmotic diarrhea from the artificial sugars in her diet . I counseled her about it and to maintain a diary to determine which foods cause her to get more symptoms   1. EGD/Colonoscopy to evaluate for dyspepsia and change in bowel habits.   2. Avoid excess consumption of artificial sugars  3. LOW FODMAP diet   4. Celiac serology   Follow up in 8-10 weeks   Dr Jonathon Bellows MD

## 2017-01-29 NOTE — Telephone Encounter (Signed)
Gastroenterology Pre-Procedure Review  Request Date: 04/02/17 Requesting Physician: Dr. Vicente Males  PATIENT REVIEW QUESTIONS: The patient responded to the following health history questions as indicated:    1. Are you having any GI issues? yes (dyspepsia, change in bowels) 2. Do you have a personal history of Polyps? yes (removed) 3. Do you have a family history of Colon Cancer or Polyps? no 4. Diabetes Mellitus? no 5. Joint replacements in the past 12 months?no 6. Major health problems in the past 3 months?no 7. Any artificial heart valves, MVP, or defibrillator?no    MEDICATIONS & ALLERGIES:    Patient reports the following regarding taking any anticoagulation/antiplatelet therapy:   Plavix, Coumadin, Eliquis, Xarelto, Lovenox, Pradaxa, Brilinta, or Effient? no Aspirin? no  Patient confirms/reports the following medications:  Current Outpatient Prescriptions  Medication Sig Dispense Refill  . calcitonin, salmon, (MIACALCIN/FORTICAL) 200 UNIT/ACT nasal spray   3   No current facility-administered medications for this visit.     Patient confirms/reports the following allergies:  Allergies  Allergen Reactions  . Celebrex [Celecoxib]   . Clindamycin Hcl   . Loratadine   . Penicillins   . Sulfa Antibiotics     No orders of the defined types were placed in this encounter.   AUTHORIZATION INFORMATION Primary Insurance: 1D#: Group #:  Secondary Insurance: 1D#: Group #:  SCHEDULE INFORMATION: Date: 04/02/17 Time: Location: [place]

## 2017-01-31 LAB — CELIAC DISEASE PANEL
ENDOMYSIAL ANTIBODY IGA: NEGATIVE
IgA: 130 mg/dL (ref 87–352)

## 2017-03-27 IMAGING — US US BREAST*L* LIMITED INC AXILLA
1 series · 14 of 16 positions shown · non-contrast
Comparison: Previous exam(s).

CLINICAL DATA: Screening recall for a possible left breast mass.

EXAM:
2D DIGITAL DIAGNOSTIC UNILATERAL LEFT MAMMOGRAM WITH CAD AND ADJUNCT
TOMO
LEFT BREAST ULTRASOUND

[Series 1: us breast*left* limited inc axilla · 0.06mm/px · 14 of 16 slices shown]
[im 1/16]
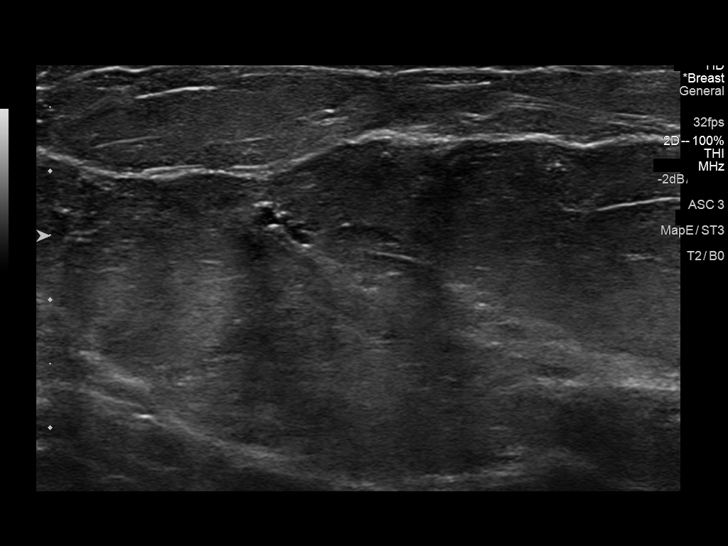
[im 2/16]
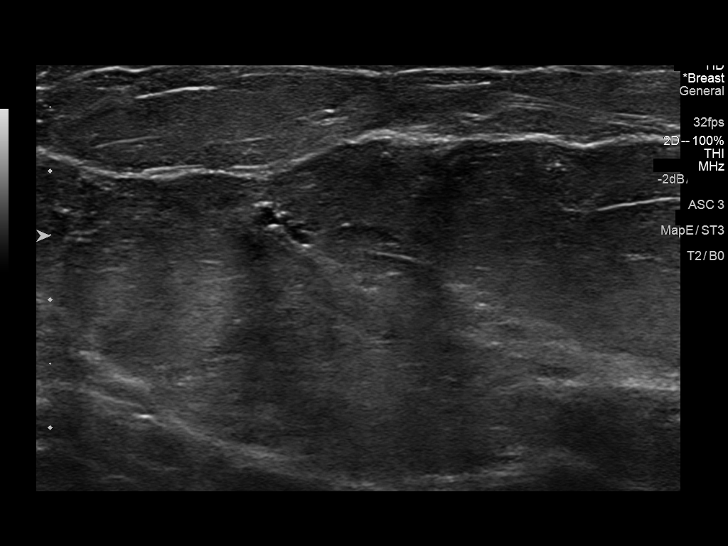
[im 3/16]
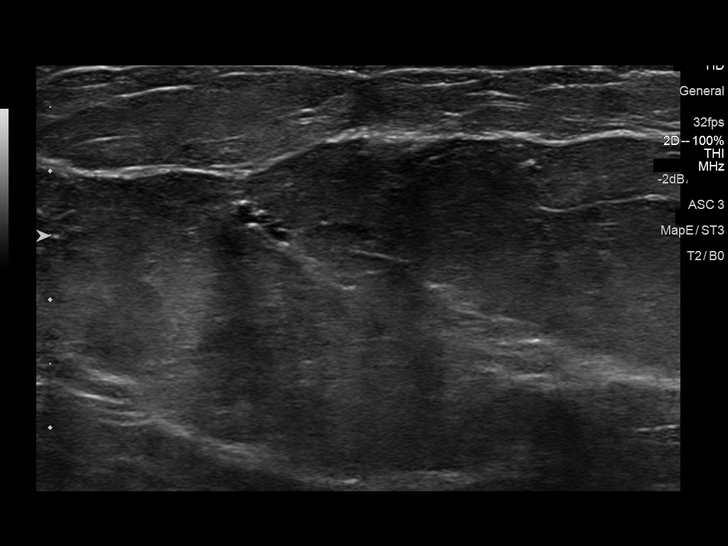
[im 5/16]
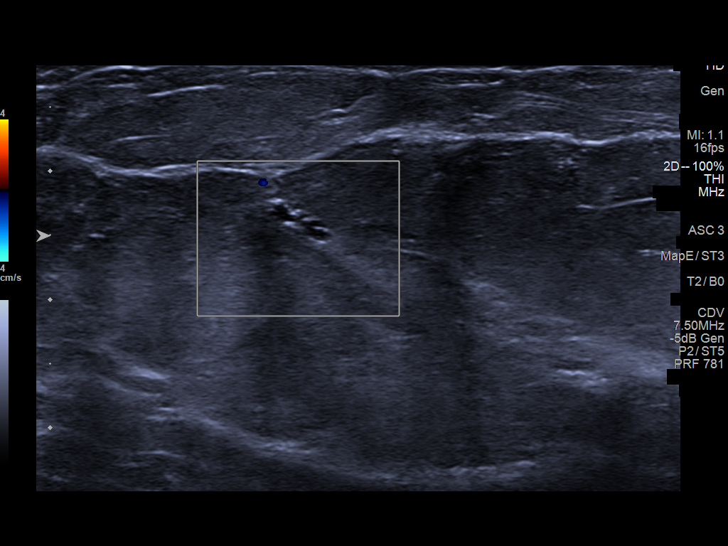
[im 6/16]
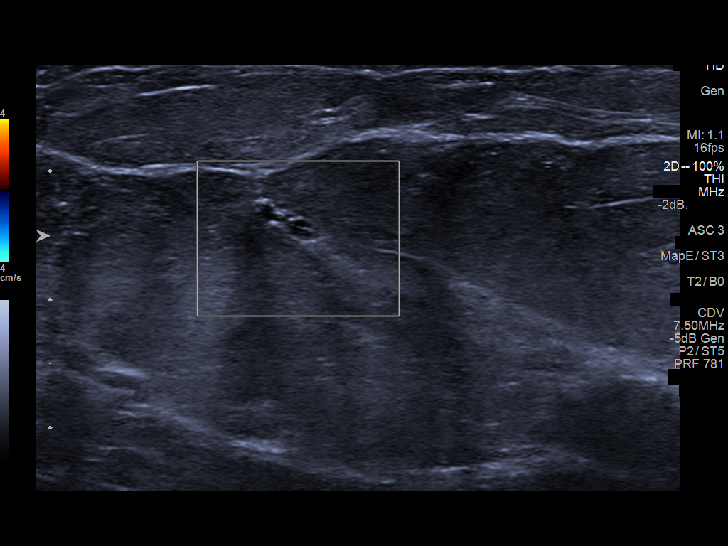
[im 7/16]
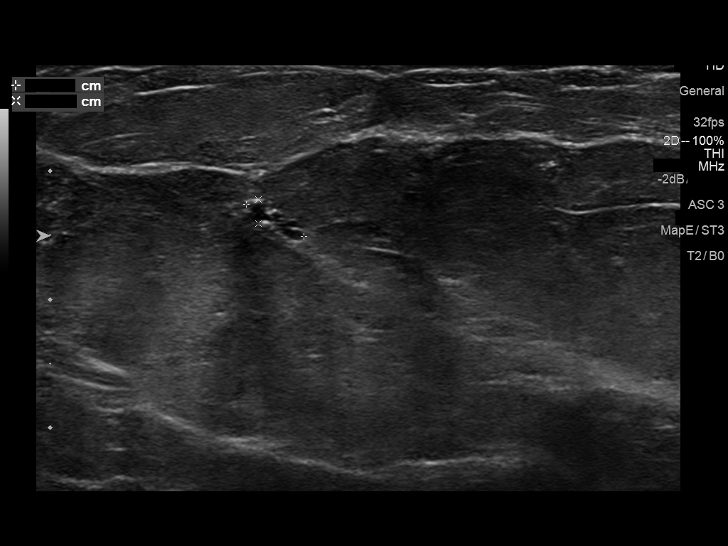
[im 8/16]
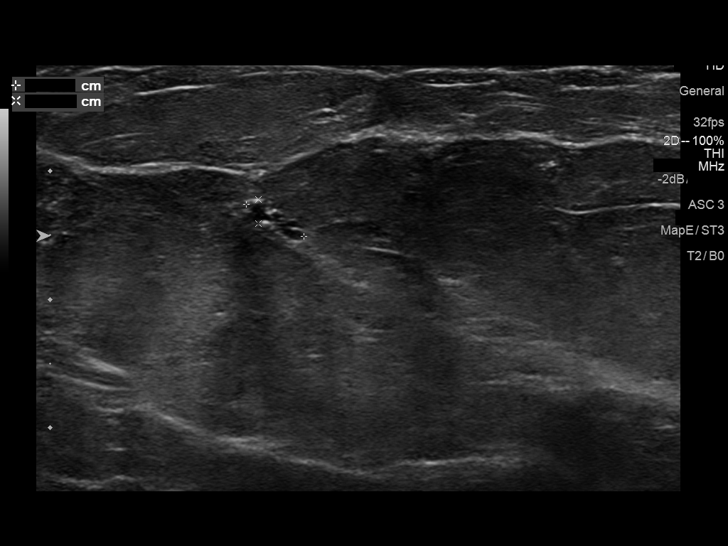
[im 9/16]
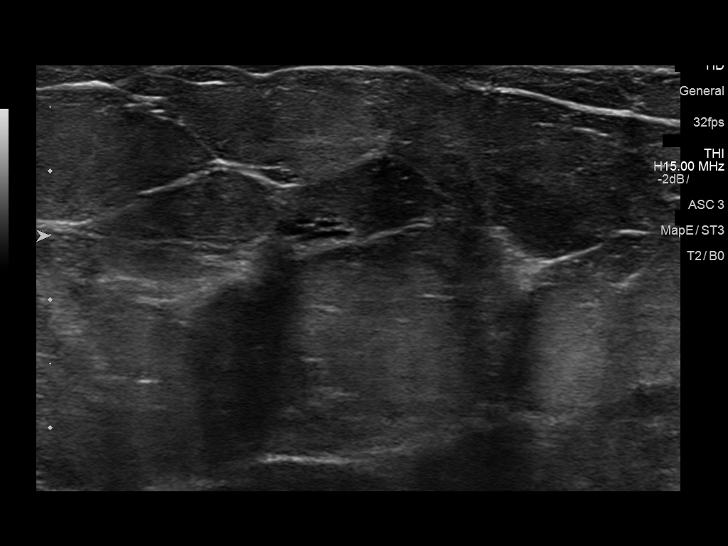
[im 10/16]
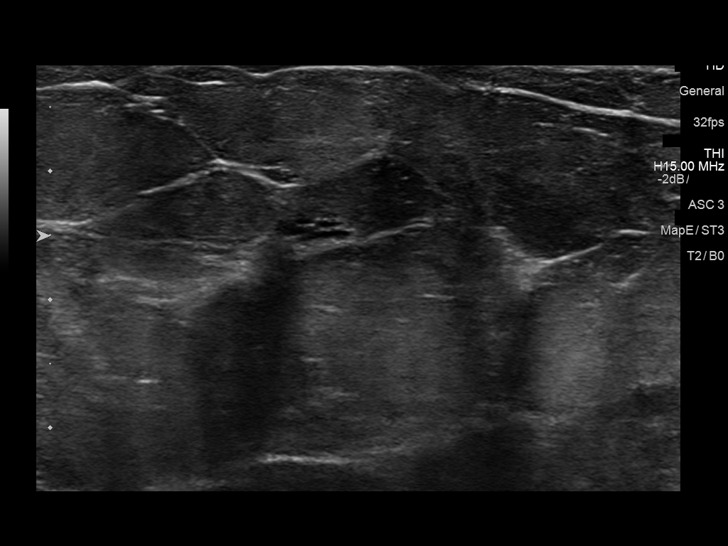
[im 11/16]
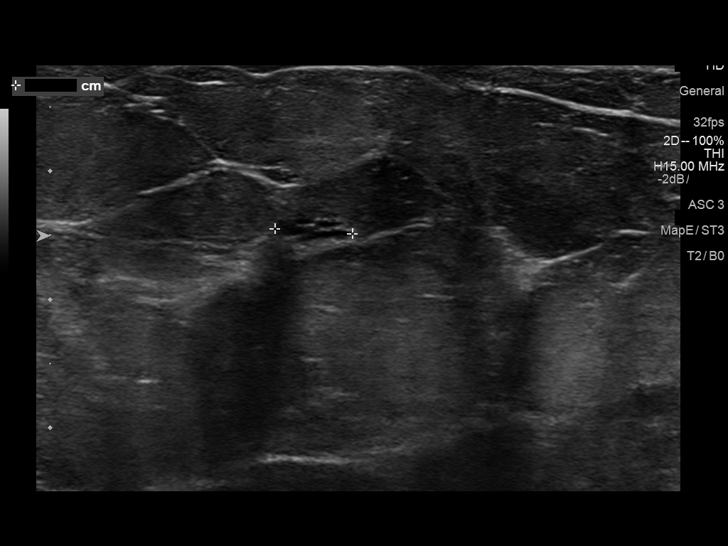
[im 13/16]
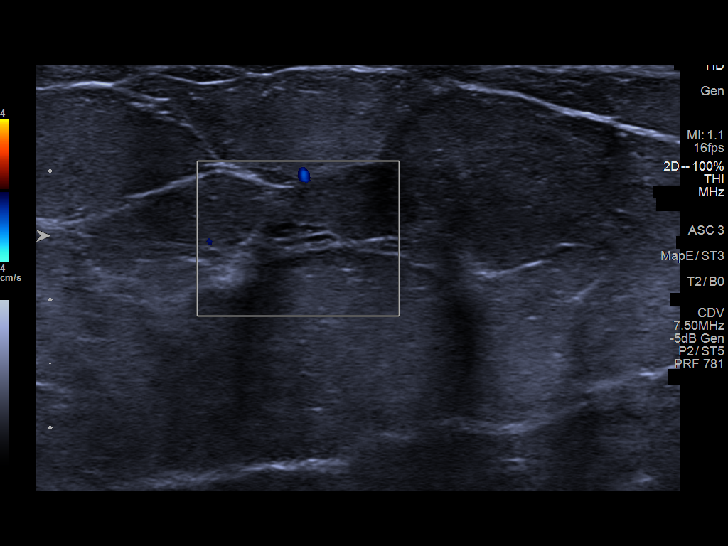
[im 14/16]
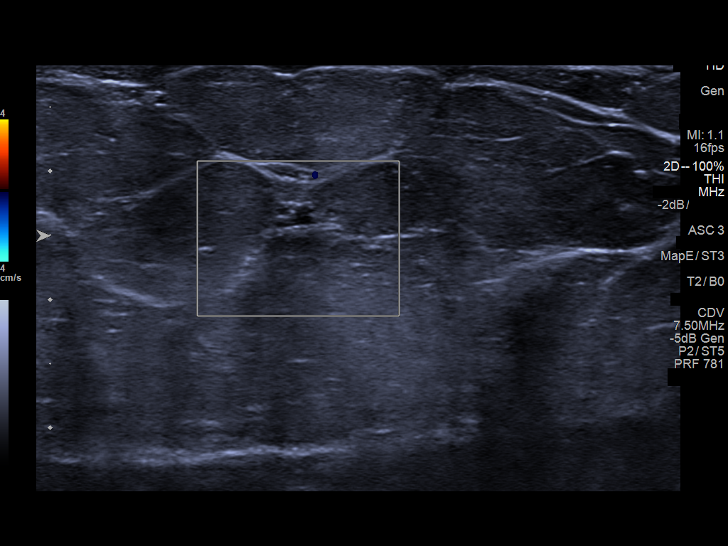
[im 15/16]
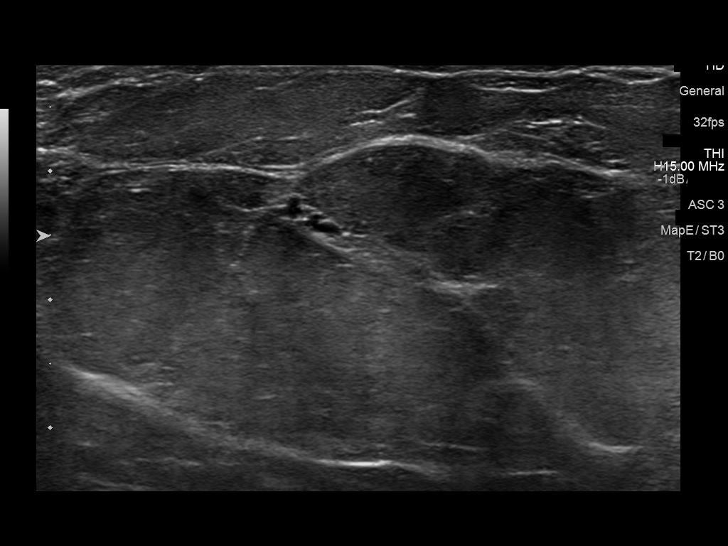
[im 16/16]
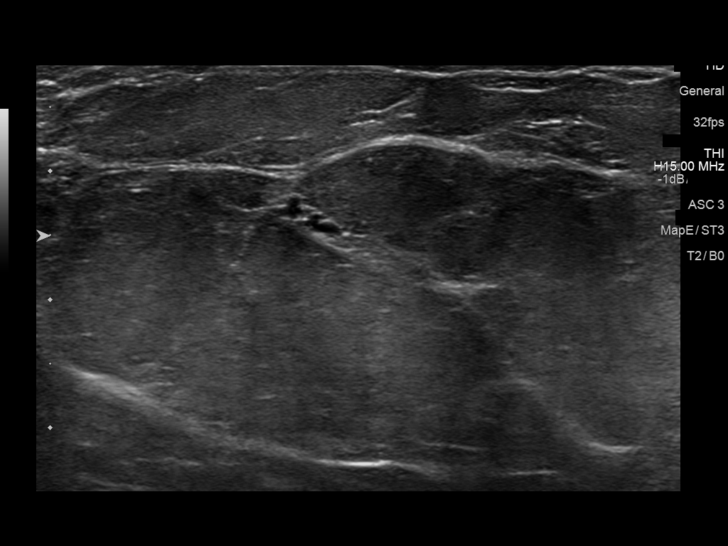

[14 of 16 positions shown; findings below may reference images not displayed]

ACR Breast Density Category b: There are scattered areas of
fibroglandular density.
FINDINGS: In the medial aspect of the left breast, middle depth there is a 5
mm oval mass with indistinct margins.

Mammographic images were processed with CAD.

Ultrasound of the left breast at 9 o'clock, 5 cm from the nipple
demonstrates an anechoic irregular mass measuring 5 x 2 x 6 mm,
favored to represent a cluster of cysts.
IMPRESSION: The mass in the left breast at 9 o'clock is probably benign, favored
to represent fibrocystic change.

RECOMMENDATION:
Six-month follow-up diagnostic left breast mammogram and ultrasound
is recommended.

I have discussed the findings and recommendations with the patient.
Results were also provided in writing at the conclusion of the
visit. If applicable, a reminder letter will be sent to the patient
regarding the next appointment.

BI-RADS CATEGORY  3: Probably benign.

## 2017-04-02 ENCOUNTER — Ambulatory Visit: Payer: 59 | Admitting: Certified Registered Nurse Anesthetist

## 2017-04-02 ENCOUNTER — Ambulatory Visit
Admission: RE | Admit: 2017-04-02 | Discharge: 2017-04-02 | Disposition: A | Discharge status: Home / Self Care | Payer: 59 | Source: Ambulatory Visit | Attending: Gastroenterology | Admitting: Gastroenterology

## 2017-04-02 ENCOUNTER — Encounter: Payer: Self-pay | Admitting: Anesthesiology

## 2017-04-02 ENCOUNTER — Encounter
Admission: RE | Disposition: A | Discharge status: Home / Self Care | Payer: Self-pay | Source: Ambulatory Visit | Attending: Gastroenterology

## 2017-04-02 DIAGNOSIS — Z853 Personal history of malignant neoplasm of breast: Secondary | ICD-10-CM | POA: Insufficient documentation

## 2017-04-02 DIAGNOSIS — D649 Anemia, unspecified: Secondary | ICD-10-CM | POA: Diagnosis not present

## 2017-04-02 DIAGNOSIS — R194 Change in bowel habit: Secondary | ICD-10-CM | POA: Diagnosis not present

## 2017-04-02 DIAGNOSIS — K219 Gastro-esophageal reflux disease without esophagitis: Secondary | ICD-10-CM | POA: Diagnosis not present

## 2017-04-02 DIAGNOSIS — K649 Unspecified hemorrhoids: Secondary | ICD-10-CM | POA: Diagnosis not present

## 2017-04-02 DIAGNOSIS — K210 Gastro-esophageal reflux disease with esophagitis: Secondary | ICD-10-CM | POA: Diagnosis not present

## 2017-04-02 DIAGNOSIS — R1013 Epigastric pain: Secondary | ICD-10-CM

## 2017-04-02 DIAGNOSIS — Z9221 Personal history of antineoplastic chemotherapy: Secondary | ICD-10-CM | POA: Diagnosis not present

## 2017-04-02 DIAGNOSIS — D123 Benign neoplasm of transverse colon: Secondary | ICD-10-CM | POA: Diagnosis not present

## 2017-04-02 DIAGNOSIS — R197 Diarrhea, unspecified: Secondary | ICD-10-CM | POA: Diagnosis not present

## 2017-04-02 DIAGNOSIS — K640 First degree hemorrhoids: Secondary | ICD-10-CM | POA: Diagnosis not present

## 2017-04-02 DIAGNOSIS — K635 Polyp of colon: Secondary | ICD-10-CM | POA: Diagnosis not present

## 2017-04-02 DIAGNOSIS — Z923 Personal history of irradiation: Secondary | ICD-10-CM | POA: Insufficient documentation

## 2017-04-02 HISTORY — PX: ESOPHAGOGASTRODUODENOSCOPY (EGD) WITH PROPOFOL: SHX5813

## 2017-04-02 HISTORY — PX: COLONOSCOPY WITH PROPOFOL: SHX5780

## 2017-04-02 SURGERY — COLONOSCOPY WITH PROPOFOL
Anesthesia: General

## 2017-04-02 MED ORDER — SODIUM CHLORIDE 0.9 % IV SOLN
INTRAVENOUS | Status: DC
  Administered 2017-04-02: 09:00:00 via INTRAVENOUS

## 2017-04-02 MED ORDER — PROPOFOL 500 MG/50ML IV EMUL
INTRAVENOUS | Status: DC | PRN
  Administered 2017-04-02: 150 ug/kg/min via INTRAVENOUS

## 2017-04-02 MED ORDER — PHENYLEPHRINE HCL 10 MG/ML IJ SOLN
INTRAMUSCULAR | Status: AC
  Filled 2017-04-02: qty 1

## 2017-04-02 MED ORDER — FENTANYL CITRATE (PF) 100 MCG/2ML IJ SOLN
INTRAMUSCULAR | Status: AC
  Filled 2017-04-02: qty 2

## 2017-04-02 MED ORDER — MIDAZOLAM HCL 2 MG/2ML IJ SOLN
INTRAMUSCULAR | Status: AC
  Filled 2017-04-02: qty 2

## 2017-04-02 MED ORDER — PROPOFOL 10 MG/ML IV BOLUS
INTRAVENOUS | Status: DC | PRN
  Administered 2017-04-02: 50 mg via INTRAVENOUS

## 2017-04-02 MED ORDER — PROPOFOL 500 MG/50ML IV EMUL
INTRAVENOUS | Status: AC
  Filled 2017-04-02: qty 50

## 2017-04-02 MED ORDER — MIDAZOLAM HCL 5 MG/5ML IJ SOLN
INTRAMUSCULAR | Status: DC | PRN
  Administered 2017-04-02 (×2): 1 mg via INTRAVENOUS

## 2017-04-02 MED ORDER — FENTANYL CITRATE (PF) 100 MCG/2ML IJ SOLN
INTRAMUSCULAR | Status: DC | PRN
  Administered 2017-04-02 (×2): 50 ug via INTRAVENOUS

## 2017-04-02 NOTE — Anesthesia Post-op Follow-up Note (Cosign Needed)
Anesthesia QCDR form completed.        

## 2017-04-02 NOTE — Op Note (Signed)
[facility] Gastroenterology Patient Name: Marissa Phillips Procedure Date: 04/02/2017 8:26 AM MRN: [id number] Account #: [id number] Date of Birth: 1961-10-15 Admit Type: Outpatient Age: 56 Room: [facility] ENDO ROOM 1 Gender: Female Note Status: Finalized Procedure:            Upper GI endoscopy Indications:          Diarrhea Providers:            Jonathon Bellows MD, MD Referring MD:         Leonie Douglas. Doy Hutching, MD (Referring MD) Medicines:            Monitored Anesthesia Care Complications:        No immediate complications. Procedure:            Pre-Anesthesia Assessment:                       - Prior to the procedure, a History and Physical was                        performed, and patient medications, allergies and                        sensitivities were reviewed. The patient's tolerance of                        previous anesthesia was reviewed.                       - The risks and benefits of the procedure and the                        sedation options and risks were discussed with the                        patient. All questions were answered and informed                        consent was obtained.                       - ASA Grade Assessment: III - A patient with severe                        systemic disease.                       After obtaining informed consent, the endoscope was                        passed under direct vision. Throughout the procedure,                        the patient's blood pressure, pulse, and oxygen                        saturations were monitored continuously. The Endoscope                        was introduced through the mouth, and advanced to the  third part of duodenum. The upper GI endoscopy was                        accomplished with ease. The patient tolerated the                        procedure well. Findings:      The stomach was normal.      The examined duodenum was normal.      LA Grade A (one or  more mucosal breaks less than 5 mm, not extending       between tops of 2 mucosal folds) esophagitis with no bleeding was found       in the lower third of the esophagus. Impression:           - Normal stomach.                       - Normal examined duodenum.                       - Normal esophagus.                       - LA Grade A reflux esophagitis.                       - No specimens collected. Recommendation:       - Perform a colonoscopy today.                       - Follow an antireflux regimen indefinitely.                       - Use an H2 blocker for 3 months. Procedure Code(s):    --- Professional ---                       [phone], Esophagogastroduodenoscopy, flexible, transoral;                        diagnostic, including collection of specimen(s) by                        brushing or washing, when performed (separate procedure) Diagnosis Code(s):    --- Professional ---                       K21.0, Gastro-esophageal reflux disease with esophagitis                       R19.7, Diarrhea, unspecified CPT copyright 2016 American Medical Association. All rights reserved. The codes documented in this report are preliminary and upon coder review may  be revised to meet current compliance requirements. Jonathon Bellows, MD Jonathon Bellows MD, MD 04/02/2017 8:50:48 AM This report has been signed electronically. Number of Addenda: 0 Note Initiated On: 04/02/2017 8:26 AM      [facility]

## 2017-04-02 NOTE — Anesthesia Preprocedure Evaluation (Signed)
Anesthesia Evaluation  Patient identified by MRN, date of birth, ID band Patient awake    Reviewed: Allergy & Precautions, NPO status , Patient's Chart, lab work & pertinent test results, reviewed documented beta blocker date and time   Airway Mallampati: III  TM Distance: >3 FB     Dental  (+) Chipped   Pulmonary asthma ,           Cardiovascular      Neuro/Psych    GI/Hepatic GERD  ,  Endo/Other    Renal/GU      Musculoskeletal   Abdominal   Peds  Hematology  (+) anemia ,   Anesthesia Other Findings   Reproductive/Obstetrics                             Anesthesia Physical Anesthesia Plan  ASA: III  Anesthesia Plan: General   Post-op Pain Management:    Induction: Intravenous  Airway Management Planned:   Additional Equipment:   Intra-op Plan:   Post-operative Plan:   Informed Consent: I have reviewed the patients History and Physical, chart, labs and discussed the procedure including the risks, benefits and alternatives for the proposed anesthesia with the patient or authorized representative who has indicated his/her understanding and acceptance.     Plan Discussed with: CRNA  Anesthesia Plan Comments:         Anesthesia Quick Evaluation

## 2017-04-02 NOTE — Transfer of Care (Signed)
Immediate Anesthesia Transfer of Care Note  Patient: Marissa Phillips  Procedure(s) Performed: Procedure(s): COLONOSCOPY WITH PROPOFOL (N/A) ESOPHAGOGASTRODUODENOSCOPY (EGD) WITH PROPOFOL (N/A)  Patient Location: PACU and Endoscopy Unit  Anesthesia Type:General  Level of Consciousness: awake, alert  and oriented  Airway & Oxygen Therapy: Patient Spontanous Breathing and Patient connected to nasal cannula oxygen  Post-op Assessment: Report given to RN, Post -op Vital signs reviewed and stable and Patient moving all extremities X 4  Post vital signs: Reviewed and stable  Last Vitals:  Vitals:   04/02/17 0819 04/02/17 0917  BP: (!) 149/90   Pulse: 100   Resp: 20   Temp: (!) 36.1 C 36.2 C    Last Pain:  Vitals:   04/02/17 0917  TempSrc: Tympanic      Patients Stated Pain Goal: 0 (37/16/96 7893)  Complications: No apparent anesthesia complications

## 2017-04-02 NOTE — Anesthesia Postprocedure Evaluation (Signed)
Anesthesia Post Note  Patient: [name]  Procedure(s) Performed: Procedure(s) (LRB): COLONOSCOPY WITH PROPOFOL (N/A) ESOPHAGOGASTRODUODENOSCOPY (EGD) WITH PROPOFOL (N/A)  Patient location during evaluation: Endoscopy Anesthesia Type: General Level of consciousness: awake and alert Pain management: pain level controlled Vital Signs Assessment: post-procedure vital signs reviewed and stable Respiratory status: spontaneous breathing, nonlabored ventilation, respiratory function stable and patient connected to nasal cannula oxygen Cardiovascular status: blood pressure returned to baseline and stable Postop Assessment: no signs of nausea or vomiting Anesthetic complications: no     Last Vitals:  Vitals:   04/02/17 0938 04/02/17 0948  BP: 116/81 116/81  Pulse: 79 64  Resp: 20 11  Temp:      Last Pain:  Vitals:   04/02/17 0917  TempSrc: Tympanic                 Kathyleen Radice S

## 2017-04-02 NOTE — Op Note (Signed)
[facility] Gastroenterology Patient Name: Marissa Phillips Procedure Date: 04/02/2017 8:26 AM MRN: [id number] Account #: [id number] Date of Birth: [birth year] Admit Type: Outpatient Age: 56 Room: [facility] ENDO ROOM 1 Gender: Female Note Status: Finalized Procedure:            Colonoscopy Indications:          Clinically significant diarrhea of unexplained origin Providers:            Jonathon Bellows MD, MD Medicines:            Monitored Anesthesia Care Complications:        No immediate complications. Procedure:            Pre-Anesthesia Assessment:                       - Prior to the procedure, a History and Physical was                        performed, and patient medications, allergies and                        sensitivities were reviewed. The patient's tolerance of                        previous anesthesia was reviewed.                       - The risks and benefits of the procedure and the                        sedation options and risks were discussed with the                        patient. All questions were answered and informed                        consent was obtained.                       - ASA Grade Assessment: II - A patient with mild                        systemic disease.                       After obtaining informed consent, the colonoscope was                        passed under direct vision. Throughout the procedure,                        the patient's blood pressure, pulse, and oxygen                        saturations were monitored continuously. The                        Colonoscope was introduced through the anus and                        advanced to the the terminal ileum. The colonoscopy was  performed with ease. The patient tolerated the                        procedure well. The quality of the bowel preparation                        was good. Findings:      The perianal and digital rectal examinations were  normal.      Non-bleeding internal hemorrhoids were found during retroflexion. The       hemorrhoids were medium-sized and Grade I (internal hemorrhoids that do       not prolapse).      A 6 mm polyp was found in the hepatic flexure. The polyp was sessile.       The polyp was removed with a cold snare. Resection and retrieval were       complete.      Normal mucosa was found in the entire colon. Biopsies for histology were       taken with a cold forceps from the right colon and left colon for       evaluation of microscopic colitis.      The terminal ileum appeared normal. Biopsies were taken with a cold       forceps for histology. Impression:           - Non-bleeding internal hemorrhoids.                       - One 6 mm polyp at the hepatic flexure, removed with a                        cold snare. Resected and retrieved.                       - Normal mucosa in the entire examined colon. Biopsied.                       - The examined portion of the ileum was normal.                        Biopsied. Recommendation:       - Discharge patient to home (with escort).                       - Resume previous diet.                       - Continue present medications.                       - Await pathology results.                       - Repeat colonoscopy in 5-10 years for surveillance                        based on pathology results.                       - Return to GI office PRN. Procedure Code(s):    --- Professional ---  45385, Colonoscopy, flexible; with removal of tumor(s),                        polyp(s), or other lesion(s) by snare technique                       45380, 59, Colonoscopy, flexible; with biopsy, single                        or multiple Diagnosis Code(s):    --- Professional ---                       D12.3, Benign neoplasm of transverse colon (hepatic                        flexure or splenic flexure)                       K64.0, First  degree hemorrhoids                       R19.7, Diarrhea, unspecified CPT copyright 2016 American Medical Association. All rights reserved. The codes documented in this report are preliminary and upon coder review may  be revised to meet current compliance requirements. Jonathon Bellows, MD Jonathon Bellows MD, MD 04/02/2017 9:15:16 AM This report has been signed electronically. Number of Addenda: 0 Note Initiated On: 04/02/2017 8:26 AM Scope Withdrawal Time: 0 hours 17 minutes 12 seconds  Total Procedure Duration: 0 hours 20 minutes 21 seconds       [facility]

## 2017-04-02 NOTE — H&P (Signed)
  Jonathon Bellows MD [address] [place], [place] [address] Phone: [phone] Fax : [phone]  Primary Care Physician:  Idelle Crouch, MD Primary Gastroenterologist:  Dr. Jonathon Bellows   Pre-Procedure History & Physical: HPI:  Marissa Phillips is a 56 y.o. female is here for an endoscopy and colonoscopy.   Past Medical History:  Diagnosis Date  . Asthma   . Cancer [facility])    Breast- Right-2005  . Cataract   . Osteoporosis   . Personal history of chemotherapy   . Personal history of radiation therapy     Past Surgical History:  Procedure Laterality Date  . BREAST BIOPSY Right 2005   +  . BREAST LUMPECTOMY WITH NEEDLE LOCALIZATION AND AXILLARY SENTINEL LYMPH NODE BX Right 2005  . BREAST SURGERY      Prior to Admission medications   Medication Sig Start Date End Date Taking? Authorizing Provider  calcitonin, salmon, (MIACALCIN/FORTICAL) 200 UNIT/ACT nasal spray  12/24/16  Yes [provider]    Allergies as of 01/29/2017 - Review Complete 01/29/2017  Allergen Reaction Noted  . Celebrex [celecoxib]  09/24/2011  . Clindamycin hcl  09/25/2011  . Loratadine  09/24/2011  . Penicillins  09/24/2011  . Sulfa antibiotics  09/24/2011    Family History  Problem Relation Age of Onset  . Cancer Mother 16    Breast, now bilateral  . Breast cancer Mother 11    Social History   Social History  . Marital status: Married    Spouse name: N/A  . Number of children: N/A  . Years of education: N/A   Occupational History  . Not on file.   Social History Main Topics  . Smoking status: Never Smoker  . Smokeless tobacco: Never Used  . Alcohol use No  . Drug use: No  . Sexual activity: Not on file   Other Topics Concern  . Not on file   Social History Narrative  . No narrative on file    Review of Systems: See HPI, otherwise negative ROS  Physical Exam: BP (!) 149/90   Pulse 100   Temp (!) 96.9 F (36.1 C) (Tympanic)   Resp 20   Ht 5\' 7"  (1.702  m)   Wt 208 lb (94.3 kg)   SpO2 99%   BMI 32.58 kg/m  General:   Alert,  pleasant and cooperative in NAD Head:  Normocephalic and atraumatic. Neck:  Supple; no masses or thyromegaly. Lungs:  Clear throughout to auscultation.    Heart:  Regular rate and rhythm. Abdomen:  Soft, nontender and nondistended. Normal bowel sounds, without guarding, and without rebound.   Neurologic:  Alert and  oriented x4;  grossly normal neurologically.  Impression/Plan: Marissa Phillips is here for an endoscopy and colonoscopy to be performed for diarrhea . It has resolved since she cut down the artificial sweetners in her diet .   Risks, benefits, limitations, and alternatives regarding  endoscopy and colonoscopy have been reviewed with the patient.  Questions have been answered.  All parties agreeable.   Jonathon Bellows, MD  04/02/2017, 8:28 AM

## 2017-04-03 ENCOUNTER — Encounter: Payer: Self-pay | Admitting: Gastroenterology

## 2017-04-04 LAB — SURGICAL PATHOLOGY

## 2017-04-09 ENCOUNTER — Telehealth: Payer: Self-pay

## 2017-04-09 NOTE — Telephone Encounter (Signed)
-----   Message from Jonathon Bellows, MD sent at 04/08/2017 10:57 AM EDT ----- Inform terminal ileum and random colon biopsies were negative, polyp was pre cancerous and hence needs repeat colonoscopy in 5 years

## 2017-04-09 NOTE — Telephone Encounter (Signed)
Advised patient of results per Dr. Vicente Males.   Inform terminal ileum and random colon biopsies were negative, polyp was pre cancerous and hence needs repeat colonoscopy in 5 years

## 2017-05-01 ENCOUNTER — Telehealth: Payer: Self-pay

## 2017-05-01 NOTE — Telephone Encounter (Signed)
Pt states low grade fever with diarrhea and vomiting. Advised no vomiting since yesterday.   Advised pt to purchase Imodium to help with diarrhea. If symptoms increase callback for appointment.

## 2017-05-13 ENCOUNTER — Encounter: Payer: Self-pay | Admitting: Physician Assistant

## 2017-05-13 ENCOUNTER — Ambulatory Visit: Payer: Self-pay | Admitting: Physician Assistant

## 2017-05-13 VITALS — BP 129/80 | HR 103 | Temp 98.3°F

## 2017-05-13 DIAGNOSIS — R319 Hematuria, unspecified: Secondary | ICD-10-CM

## 2017-05-13 DIAGNOSIS — Z299 Encounter for prophylactic measures, unspecified: Secondary | ICD-10-CM

## 2017-05-13 DIAGNOSIS — N390 Urinary tract infection, site not specified: Secondary | ICD-10-CM

## 2017-05-13 LAB — POCT URINALYSIS DIPSTICK
BILIRUBIN UA: NEGATIVE
Glucose, UA: NEGATIVE
KETONES UA: NEGATIVE
Nitrite, UA: NEGATIVE
PROTEIN UA: NEGATIVE
SPEC GRAV UA: 1.01 (ref 1.010–1.025)
Urobilinogen, UA: 0.2 E.U./dL
pH, UA: 6.5 (ref 5.0–8.0)

## 2017-05-13 MED ORDER — CIPROFLOXACIN HCL 250 MG PO TABS: 250.0000 mg | ORAL_TABLET | Freq: Two times a day (BID) | ORAL | 0 refills | Status: DC

## 2017-05-13 NOTE — Progress Notes (Signed)
S:  C/o uti sx for 2 days, burning, urgency, frequency, denies vaginal discharge, abdominal pain or flank pain: also saw a tick on her but brushed it off before it attached;  Remainder ros neg  O:  Vitals wnl, nad, no cva tenderness, back nontender, lungs c t a,cv rrr, abd soft nontender, bs normal, n/v intact  A: uti  P: cipro 250mg  bid x 7d, increase water intake, add cranberry juice, return if not improving in 2 -3 days, return earlier if worsening, discussed pyelonephritis sx, return if any sx of tick born illness

## 2017-05-15 DIAGNOSIS — R8290 Unspecified abnormal findings in urine: Secondary | ICD-10-CM | POA: Diagnosis not present

## 2017-06-14 ENCOUNTER — Other Ambulatory Visit: Payer: Self-pay | Admitting: Internal Medicine

## 2017-06-14 DIAGNOSIS — N6320 Unspecified lump in the left breast, unspecified quadrant: Secondary | ICD-10-CM

## 2017-07-23 ENCOUNTER — Ambulatory Visit
Admission: RE | Admit: 2017-07-23 | Discharge: 2017-07-23 | Disposition: A | Discharge status: Home / Self Care | Payer: 59 | Source: Ambulatory Visit | Attending: Internal Medicine | Admitting: Internal Medicine

## 2017-07-23 DIAGNOSIS — N6489 Other specified disorders of breast: Secondary | ICD-10-CM | POA: Diagnosis not present

## 2017-07-23 DIAGNOSIS — N6320 Unspecified lump in the left breast, unspecified quadrant: Secondary | ICD-10-CM

## 2017-07-23 DIAGNOSIS — R928 Other abnormal and inconclusive findings on diagnostic imaging of breast: Secondary | ICD-10-CM | POA: Diagnosis not present

## 2017-07-23 IMAGING — US US BREAST*L* LIMITED INC AXILLA
1 series · 1 of 1 positions shown · non-contrast
Comparison: Previous exams including left breast diagnostic
mammogram of [DATE].

CLINICAL DATA: Six-month follow-up for probably benign cluster of
cysts in the inner left breast. History of right breast cancer in
[U0] status post lumpectomy and radiation therapy.

EXAM:
2D DIGITAL DIAGNOSTIC LEFT MAMMOGRAM WITH CAD AND ADJUNCT TOMO
ULTRASOUND LEFT BREAST

[Series 1: us breast*left* limited inc axilla · 0.07mm/px · 1 of 1 slices shown]
[im 1/1]
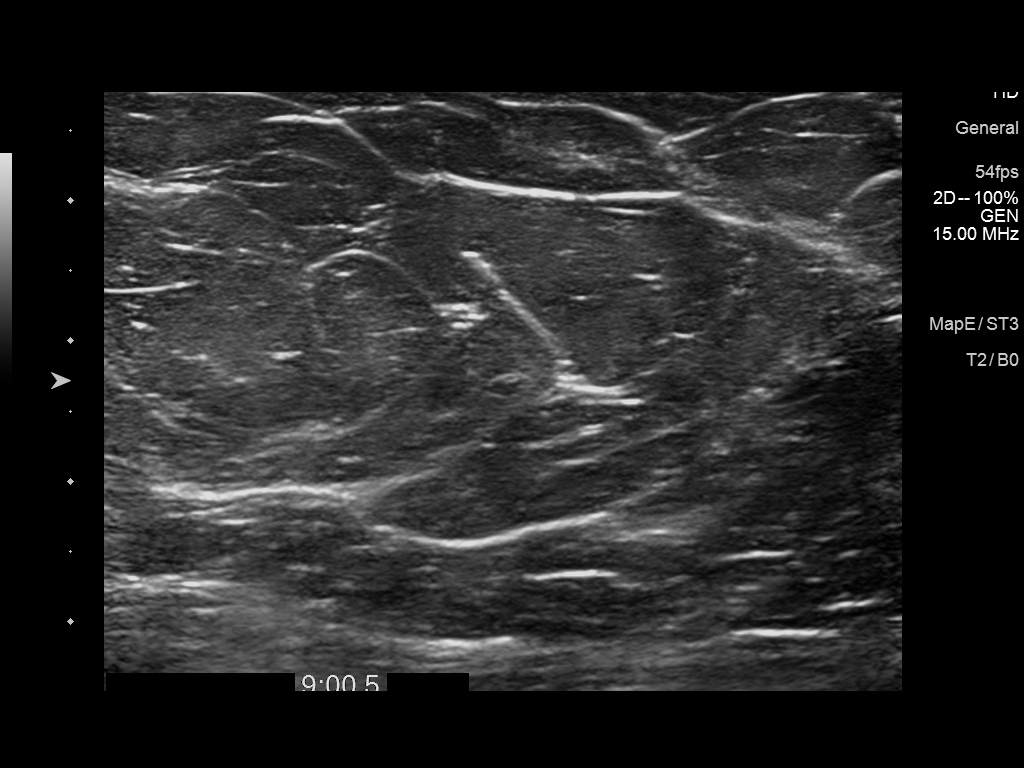

[1 of 1 positions shown; findings below may reference images not displayed]

ACR Breast Density Category b: There are scattered areas of
fibroglandular density.
FINDINGS: Left breast 2D CC and MLO projections were obtained today, with
additional 3D tomosynthesis and with additional spot compression
views of the inner left breast corresponding to the area of previous
mammographic finding.

The oval mass within the inner left breast, 9 o'clock axis region,
at middle depth, with partially obscured and partially circumscribed
margins, measures approximately 6 mm greatest dimension on today's
exam, not significantly changed in the interval.

There are no new dominant masses, suspicious calcifications or
secondary signs of malignancy identified within the left breast on
today's exam.

Mammographic images were processed with CAD.

Targeted ultrasound is performed, evaluating the entire inner left
breast with particular attention to the 9 o'clock axis corresponding
to the site of previous sonographic finding, showing no correlate
today for the mammographic finding, presumably resolved cluster of
cysts. Persistent stable finding may represent an island of normal
fibroglandular tissue versus sonographically occult cyst or apocrine
metaplasia. There is no suspicious solid or cystic mass identified
within the inner left breast on today's exam.
IMPRESSION: Probably benign island of normal fibroglandular tissue versus
sonographically occult cyst or apocrine metaplasia, stable
mammographically.

Recommend additional follow-up left breast diagnostic mammogram in 6
months to ensure continued stability. This will be performed as a
bilateral diagnostic mammogram in conjunction with patient's routine
right breast screening mammogram schedule.

RECOMMENDATION:
Bilateral diagnostic mammogram, with possible left breast
ultrasound, in 6 months.

I have discussed the findings and recommendations with the patient.
Results were also provided in writing at the conclusion of the
visit. If applicable, a reminder letter will be sent to the patient
regarding the next appointment.

BI-RADS CATEGORY  3: Probably benign.

## 2017-07-23 IMAGING — MG MM DIGITAL DIAGNOSTIC UNILAT*L* W/ TOMO W/ CAD
8 of 17 series · 8 of 33 positions shown · non-contrast
Comparison: Previous exams including left breast diagnostic
mammogram of [DATE].

CLINICAL DATA: Six-month follow-up for probably benign cluster of
cysts in the inner left breast. History of right breast cancer in
[U0] status post lumpectomy and radiation therapy.

EXAM:
2D DIGITAL DIAGNOSTIC LEFT MAMMOGRAM WITH CAD AND ADJUNCT TOMO
ULTRASOUND LEFT BREAST

[L CC synth-2D (1 of 2)]
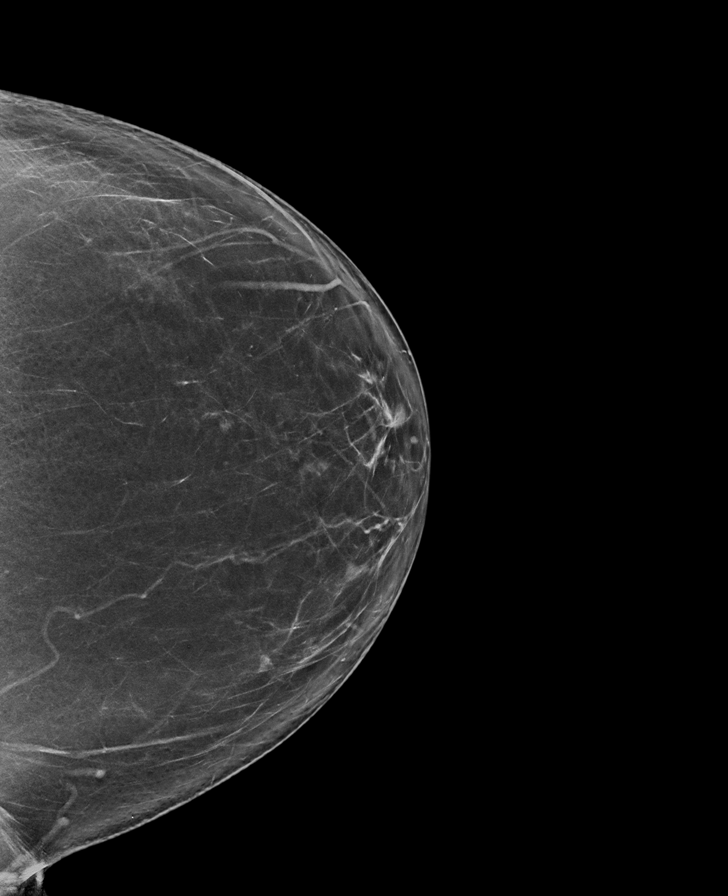

[L MLO synth-2D (1 of 2)]
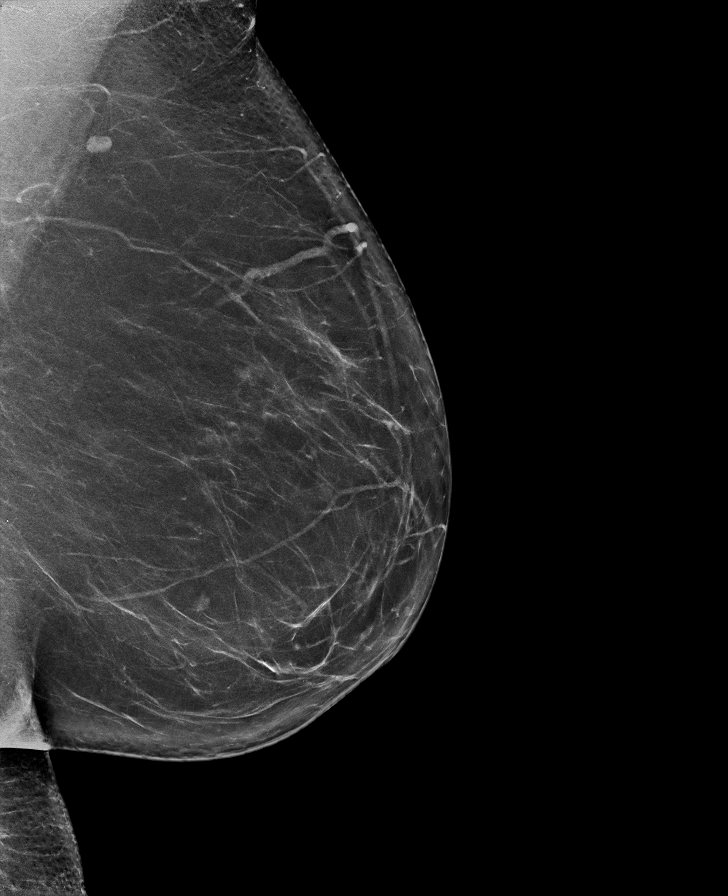

[L CC (1 of 2)]
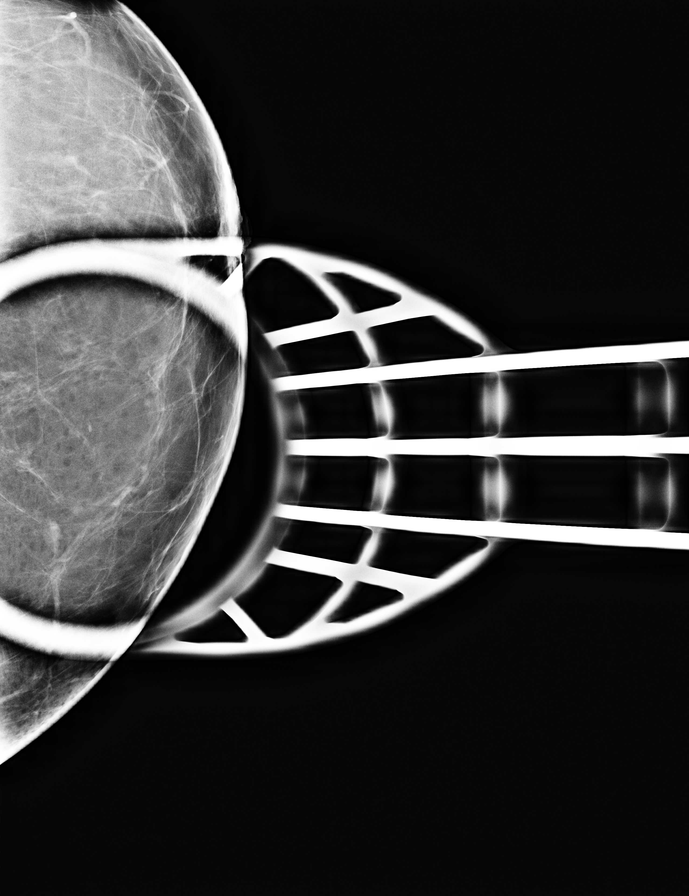

[L MLO (1 of 2)]
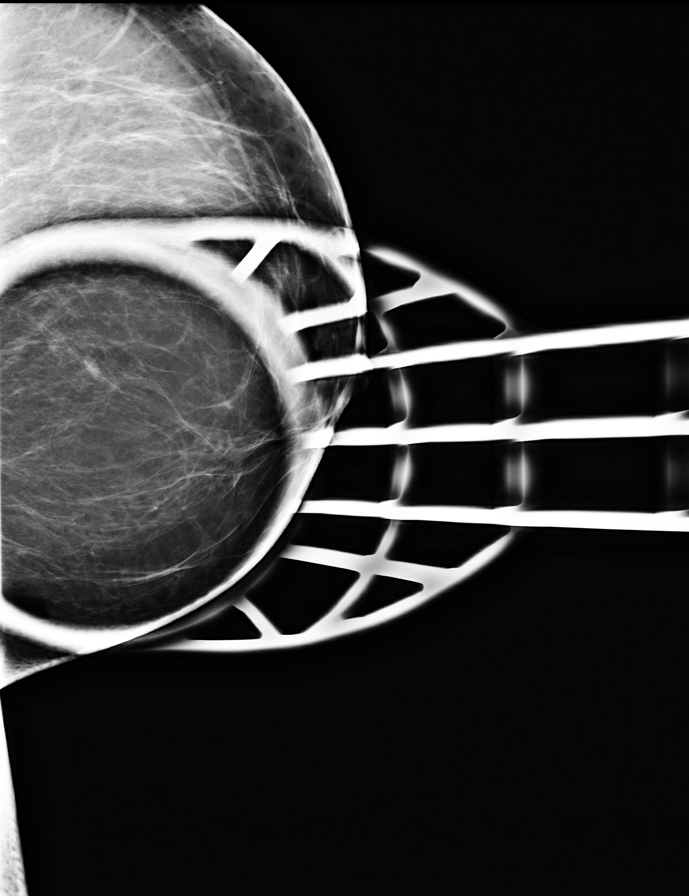

[L CC synth-2D (2 of 2)]
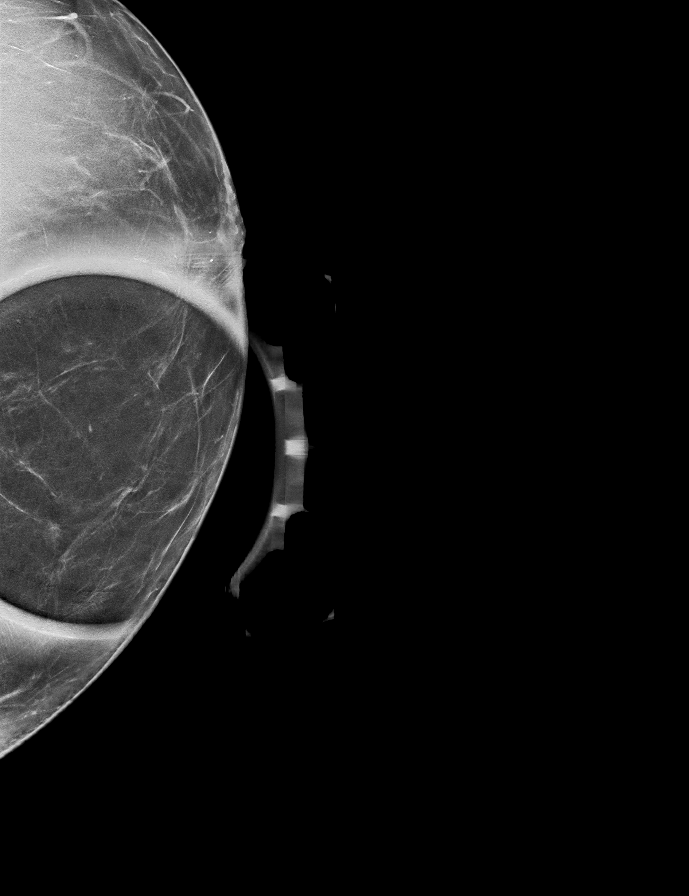

[L CC (2 of 2)]
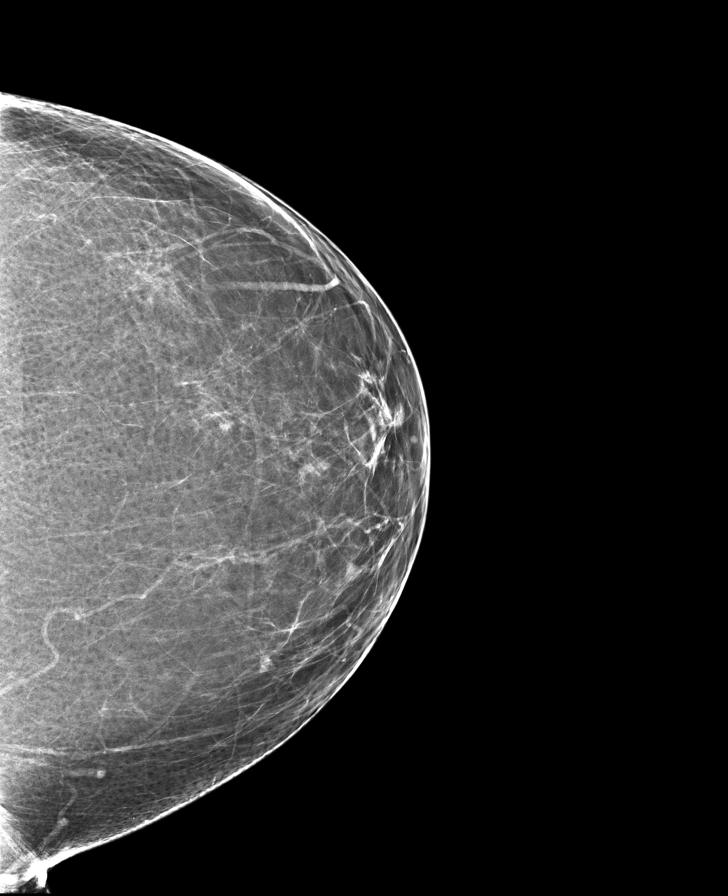

[L MLO (2 of 2)]
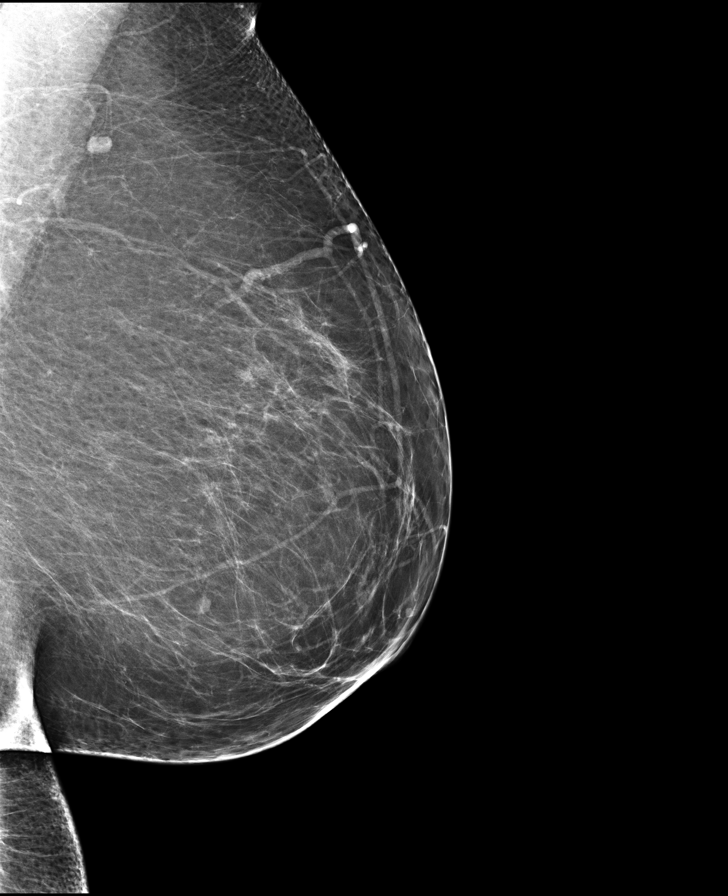

[L MLO synth-2D (2 of 2)]
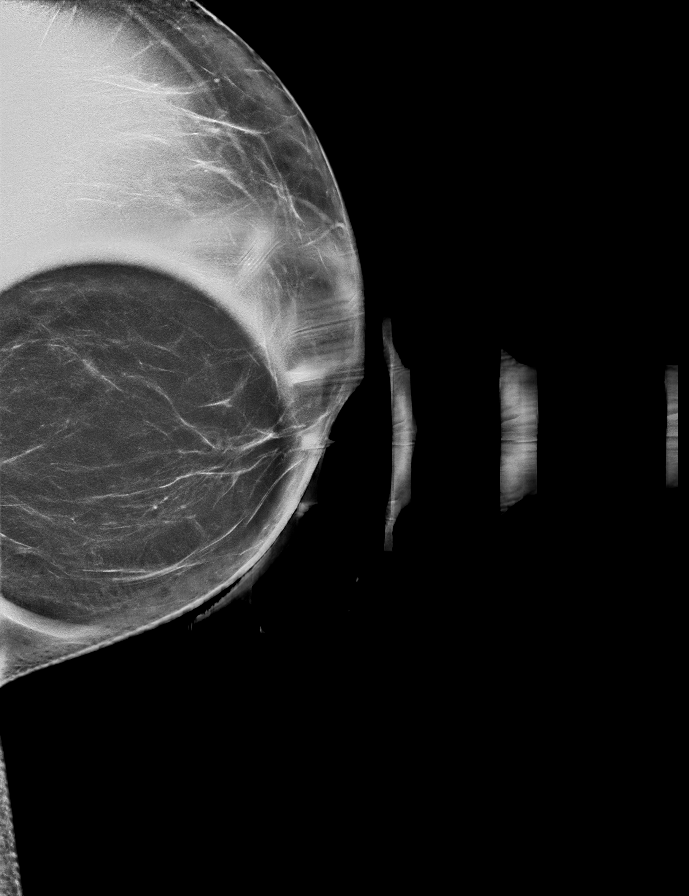

[8 of 33 positions shown; findings below may reference images not displayed]

ACR Breast Density Category b: There are scattered areas of
fibroglandular density.
FINDINGS: Left breast 2D CC and MLO projections were obtained today, with
additional 3D tomosynthesis and with additional spot compression
views of the inner left breast corresponding to the area of previous
mammographic finding.

The oval mass within the inner left breast, 9 o'clock axis region,
at middle depth, with partially obscured and partially circumscribed
margins, measures approximately 6 mm greatest dimension on today's
exam, not significantly changed in the interval.

There are no new dominant masses, suspicious calcifications or
secondary signs of malignancy identified within the left breast on
today's exam.

Mammographic images were processed with CAD.

Targeted ultrasound is performed, evaluating the entire inner left
breast with particular attention to the 9 o'clock axis corresponding
to the site of previous sonographic finding, showing no correlate
today for the mammographic finding, presumably resolved cluster of
cysts. Persistent stable finding may represent an island of normal
fibroglandular tissue versus sonographically occult cyst or apocrine
metaplasia. There is no suspicious solid or cystic mass identified
within the inner left breast on today's exam.
IMPRESSION: Probably benign island of normal fibroglandular tissue versus
sonographically occult cyst or apocrine metaplasia, stable
mammographically.

Recommend additional follow-up left breast diagnostic mammogram in 6
months to ensure continued stability. This will be performed as a
bilateral diagnostic mammogram in conjunction with patient's routine
right breast screening mammogram schedule.

RECOMMENDATION:
Bilateral diagnostic mammogram, with possible left breast
ultrasound, in 6 months.

I have discussed the findings and recommendations with the patient.
Results were also provided in writing at the conclusion of the
visit. If applicable, a reminder letter will be sent to the patient
regarding the next appointment.

BI-RADS CATEGORY  3: Probably benign.

## 2017-07-26 ENCOUNTER — Other Ambulatory Visit: Payer: Self-pay | Admitting: Internal Medicine

## 2017-07-26 DIAGNOSIS — N6002 Solitary cyst of left breast: Secondary | ICD-10-CM

## 2018-01-07 ENCOUNTER — Other Ambulatory Visit: Payer: Self-pay | Admitting: Internal Medicine

## 2018-01-07 DIAGNOSIS — K219 Gastro-esophageal reflux disease without esophagitis: Secondary | ICD-10-CM | POA: Diagnosis not present

## 2018-01-07 DIAGNOSIS — Z87440 Personal history of urinary (tract) infections: Secondary | ICD-10-CM | POA: Diagnosis not present

## 2018-01-07 DIAGNOSIS — Z0000 Encounter for general adult medical examination without abnormal findings: Secondary | ICD-10-CM | POA: Diagnosis not present

## 2018-01-07 DIAGNOSIS — E785 Hyperlipidemia, unspecified: Secondary | ICD-10-CM | POA: Diagnosis not present

## 2018-01-07 DIAGNOSIS — N6002 Solitary cyst of left breast: Secondary | ICD-10-CM

## 2018-01-09 DIAGNOSIS — E559 Vitamin D deficiency, unspecified: Secondary | ICD-10-CM | POA: Diagnosis not present

## 2018-01-09 DIAGNOSIS — Z79899 Other long term (current) drug therapy: Secondary | ICD-10-CM | POA: Diagnosis not present

## 2018-01-09 DIAGNOSIS — E785 Hyperlipidemia, unspecified: Secondary | ICD-10-CM | POA: Diagnosis not present

## 2018-01-09 DIAGNOSIS — N390 Urinary tract infection, site not specified: Secondary | ICD-10-CM | POA: Diagnosis not present

## 2018-02-05 ENCOUNTER — Ambulatory Visit
Admission: RE | Admit: 2018-02-05 | Discharge: 2018-02-05 | Disposition: A | Discharge status: Home / Self Care | Payer: 59 | Source: Ambulatory Visit | Attending: Internal Medicine | Admitting: Internal Medicine

## 2018-02-05 ENCOUNTER — Other Ambulatory Visit: Payer: Self-pay | Admitting: Internal Medicine

## 2018-02-05 DIAGNOSIS — Z853 Personal history of malignant neoplasm of breast: Secondary | ICD-10-CM | POA: Insufficient documentation

## 2018-02-05 DIAGNOSIS — N6002 Solitary cyst of left breast: Secondary | ICD-10-CM | POA: Diagnosis present

## 2018-02-05 DIAGNOSIS — N6320 Unspecified lump in the left breast, unspecified quadrant: Secondary | ICD-10-CM | POA: Insufficient documentation

## 2018-02-05 DIAGNOSIS — N6012 Diffuse cystic mastopathy of left breast: Secondary | ICD-10-CM | POA: Diagnosis not present

## 2018-02-05 DIAGNOSIS — R928 Other abnormal and inconclusive findings on diagnostic imaging of breast: Secondary | ICD-10-CM | POA: Diagnosis not present

## 2018-02-05 HISTORY — DX: Malignant neoplasm of unspecified site of unspecified female breast: C50.919

## 2018-02-05 IMAGING — MG MM DIGITAL DIAGNOSTIC BILAT W/ TOMO W/ CAD
8 of 17 series · 8 of 33 positions shown · non-contrast
Comparison: Previous exam(s).

CLINICAL DATA: Follow-up of probably benign left breast
nodule.History of treated right breast cancer, status post
lumpectomy and radiation therapy.

EXAM:
DIGITAL DIAGNOSTIC BILATERAL MAMMOGRAM WITH CAD AND TOMO
ULTRASOUND LEFT BREAST

[L MLO (1 of 2)]
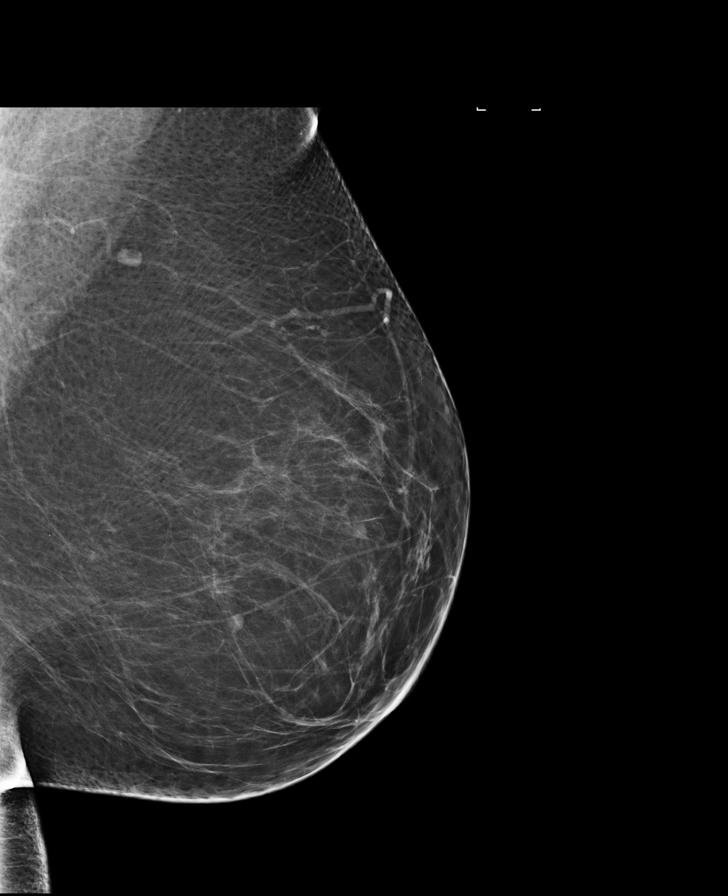

[L CC]
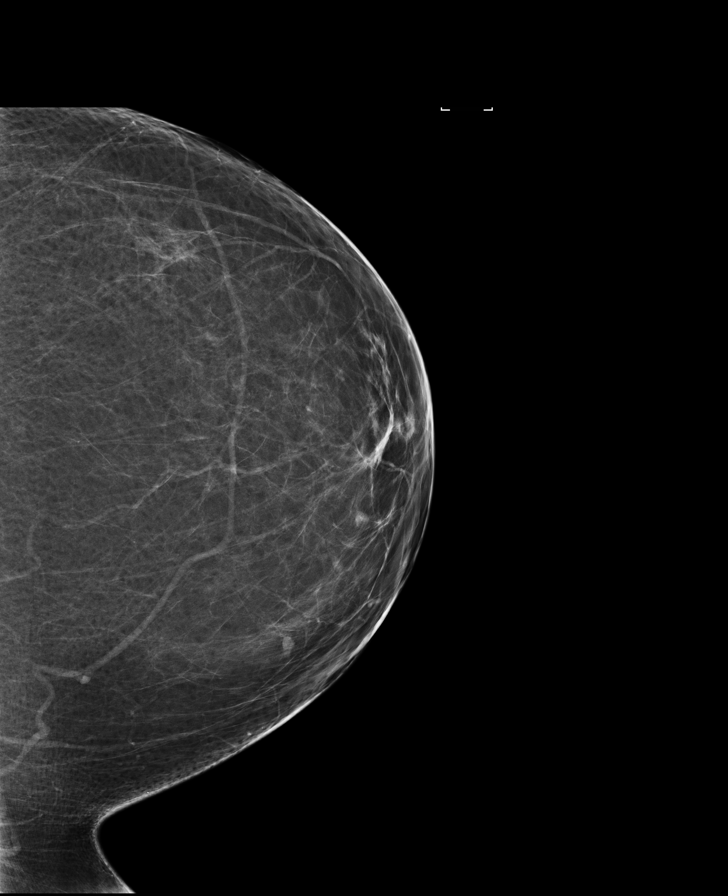

[L MLO (2 of 2)]
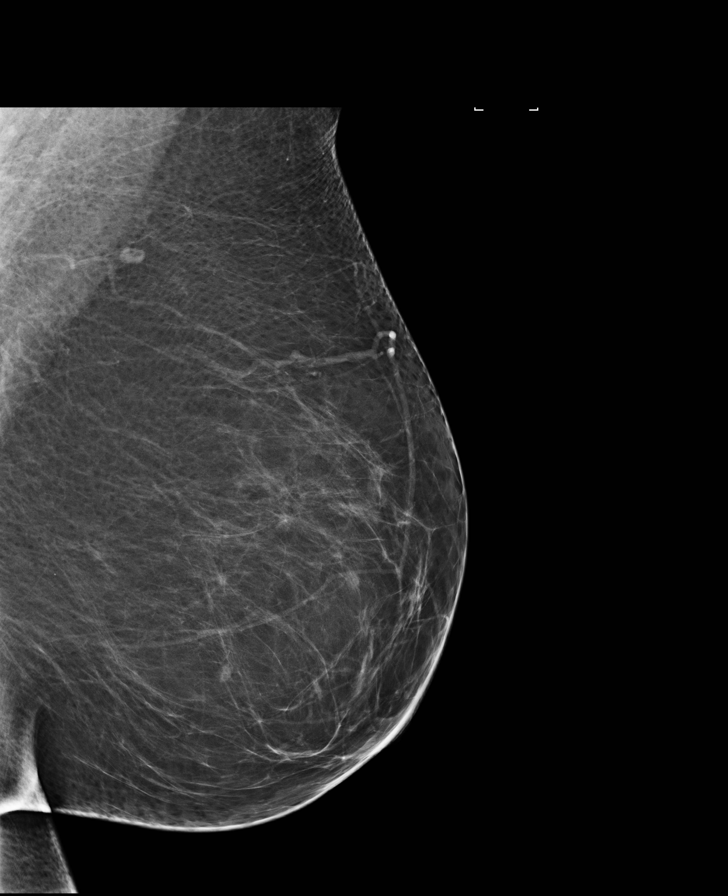

[R MLO synth-2D]
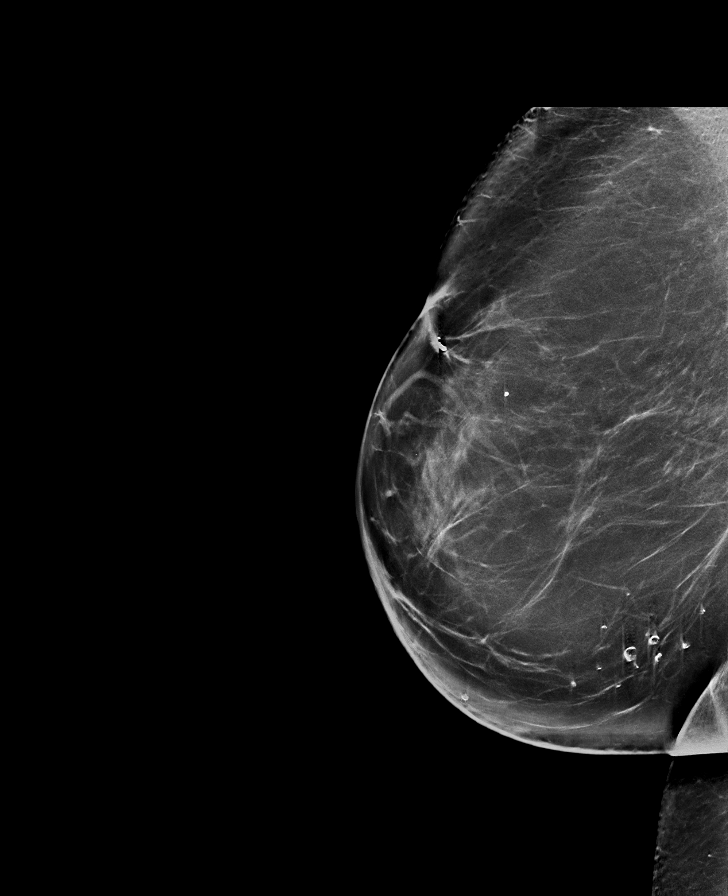

[L MLO synth-2D]
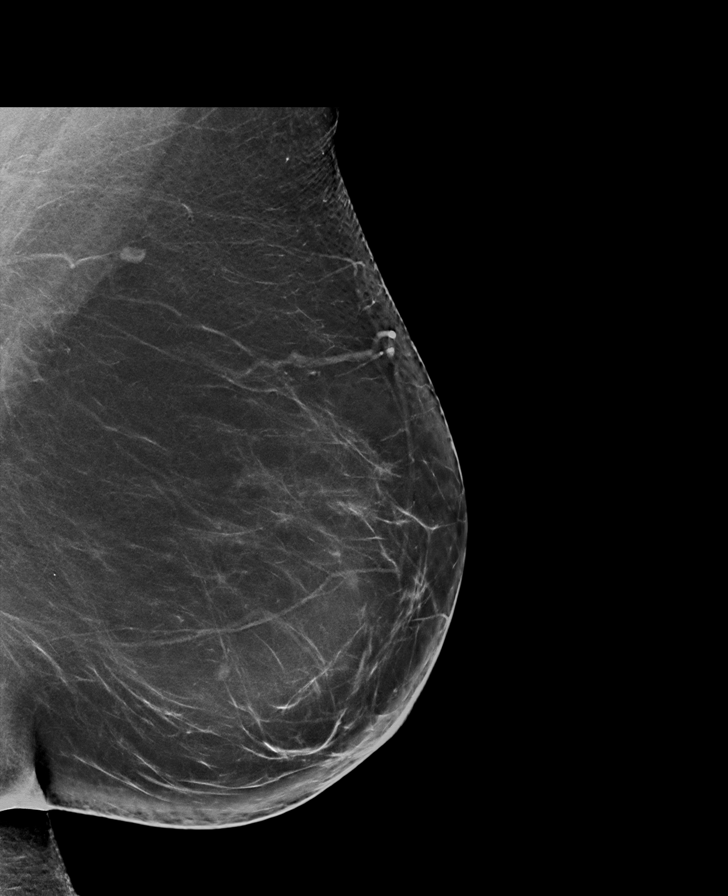

[R MLO]
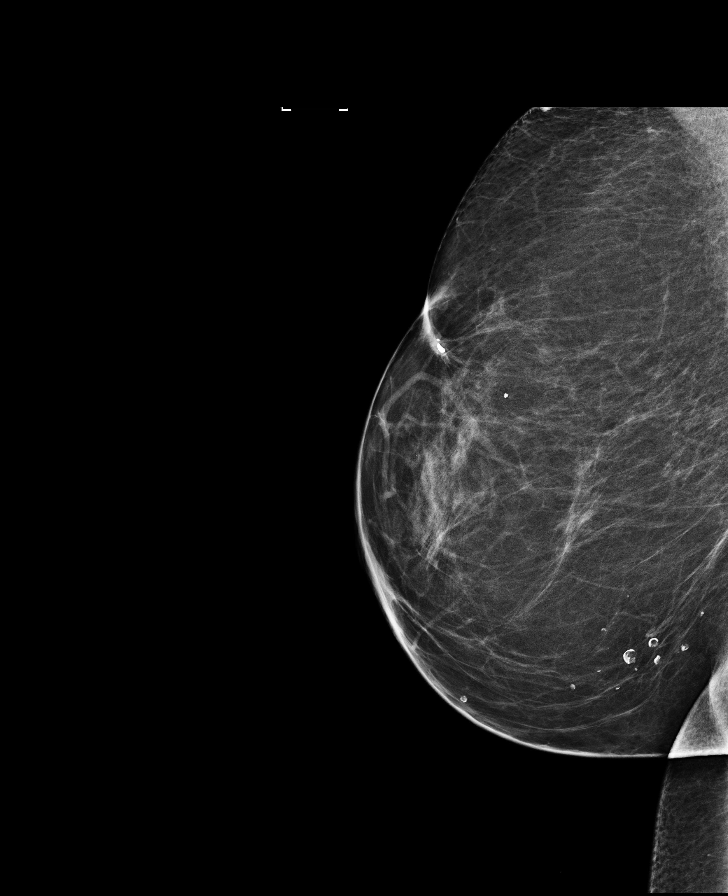

[R CC]
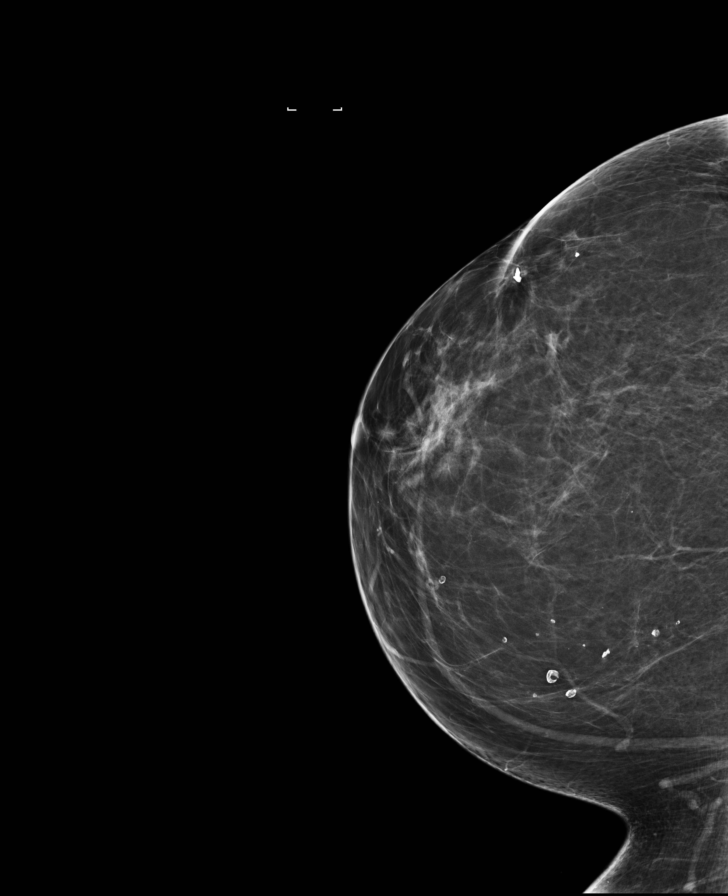

[R CC synth-2D]
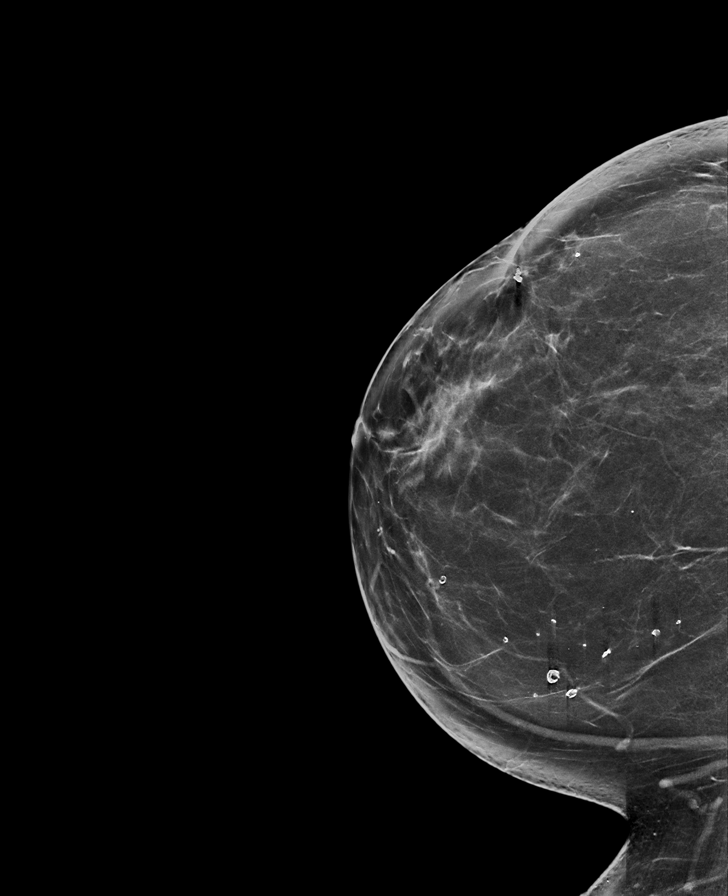

[8 of 33 positions shown; findings below may reference images not displayed]

ACR Breast Density Category b: There are scattered areas of
fibroglandular density.
FINDINGS: Mammographically, there are no suspicious masses, areas of
nonsurgical architectural distortion or microcalcifications in the
right breast. Stable post treatment changes are seen.

In the left breast slightly lower inner quadrant, middle depth,
there is a stable circumscribed 5 mm nodule. A similar appearing
low-density circumscribed 4 mm nodule is seen in the slightly upper
inner left breast, anterior depth.

Mammographic images were processed with CAD.

On physical exam, no suspicious masses are palpated.

Targeted ultrasound is performed, showing left breast 9 o'clock 5 cm
from the nipple a cluster of benign-appearing cysts measuring 0.6 x
0.2 x 0.6 cm. This finding likely corresponds to the
benign-appearing nodule seen in the lower inner left breast. No
correlation is seen to the 4 mm nodule seen in the slightly upper
inner left breast.
IMPRESSION: Two probably benign left breast nodules, for which six-month
follow-up is recommended.

RECOMMENDATION:
Diagnostic mammogram and possibly ultrasound of the left breast in 6
months. (Code:[SO])

I have discussed the findings and recommendations with the patient.
Results were also provided in writing at the conclusion of the
visit. If applicable, a reminder letter will be sent to the patient
regarding the next appointment.

BI-RADS CATEGORY  3: Probably benign.

## 2018-02-06 ENCOUNTER — Other Ambulatory Visit: Payer: Self-pay | Admitting: Internal Medicine

## 2018-02-06 DIAGNOSIS — N63 Unspecified lump in unspecified breast: Secondary | ICD-10-CM

## 2018-04-03 DIAGNOSIS — R0781 Pleurodynia: Secondary | ICD-10-CM | POA: Diagnosis not present

## 2018-04-03 DIAGNOSIS — R1084 Generalized abdominal pain: Secondary | ICD-10-CM | POA: Diagnosis not present

## 2018-04-03 DIAGNOSIS — M546 Pain in thoracic spine: Secondary | ICD-10-CM | POA: Diagnosis not present

## 2018-04-03 DIAGNOSIS — R1011 Right upper quadrant pain: Secondary | ICD-10-CM | POA: Diagnosis not present

## 2018-04-04 ENCOUNTER — Other Ambulatory Visit: Payer: Self-pay | Admitting: Internal Medicine

## 2018-04-04 DIAGNOSIS — R1011 Right upper quadrant pain: Secondary | ICD-10-CM

## 2018-04-09 ENCOUNTER — Ambulatory Visit
Admission: RE | Admit: 2018-04-09 | Discharge: 2018-04-09 | Disposition: A | Discharge status: Home / Self Care | Payer: 59 | Source: Ambulatory Visit | Attending: Internal Medicine | Admitting: Internal Medicine

## 2018-04-09 DIAGNOSIS — R1011 Right upper quadrant pain: Secondary | ICD-10-CM | POA: Diagnosis not present

## 2018-04-09 IMAGING — US US ABDOMEN COMPLETE
1 series · 14 of 25 positions shown · non-contrast
Comparison: CT [DATE]

CLINICAL DATA: Right upper quadrant abdominal pain

EXAM:
ABDOMEN ULTRASOUND COMPLETE

[Series 1: us abdomen complete · 14 of 87 slices shown]
[im 1/87]
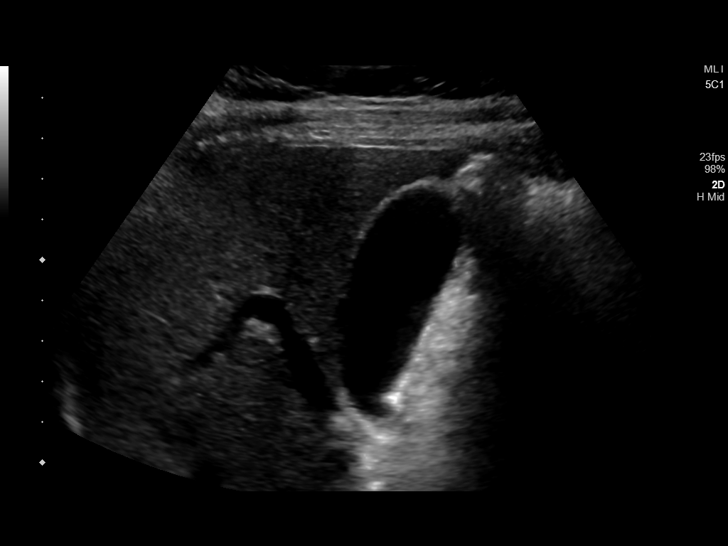
[im 8/87]
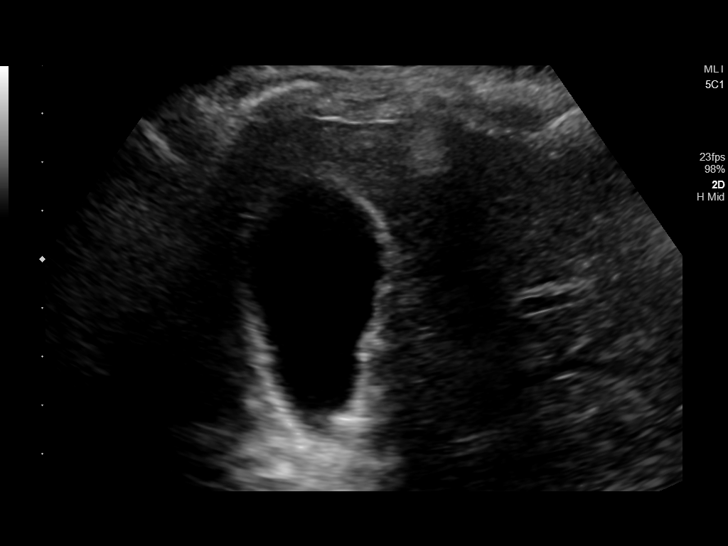
[im 15/87]
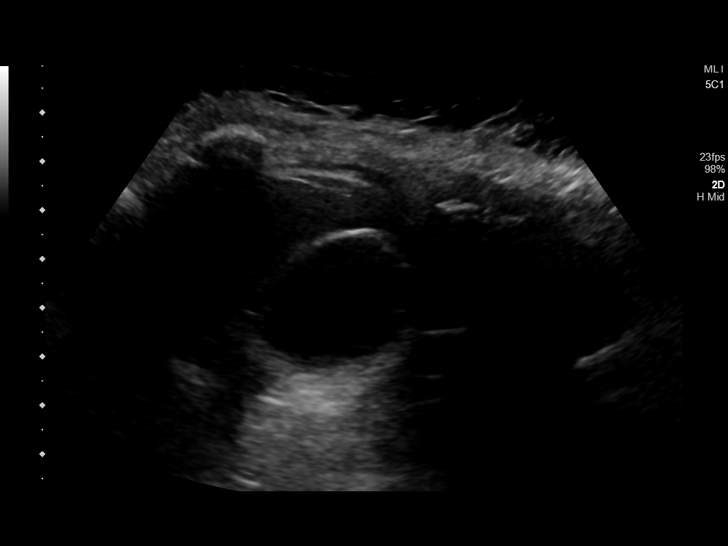
[im 22/87]
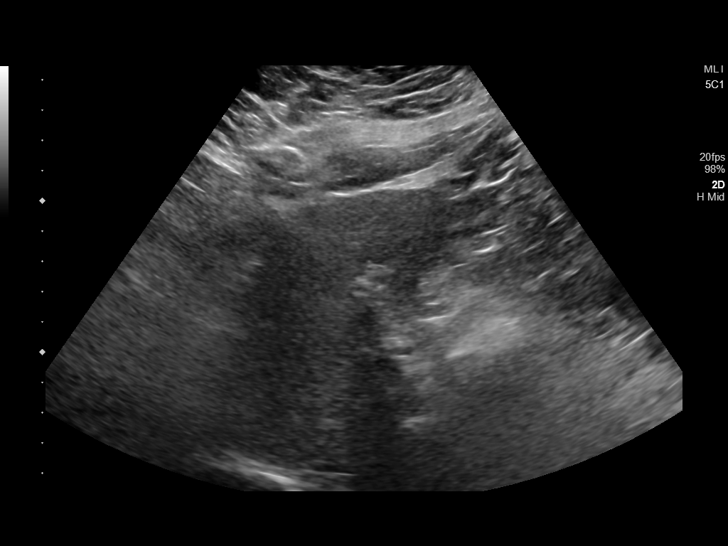
[im 29/87]
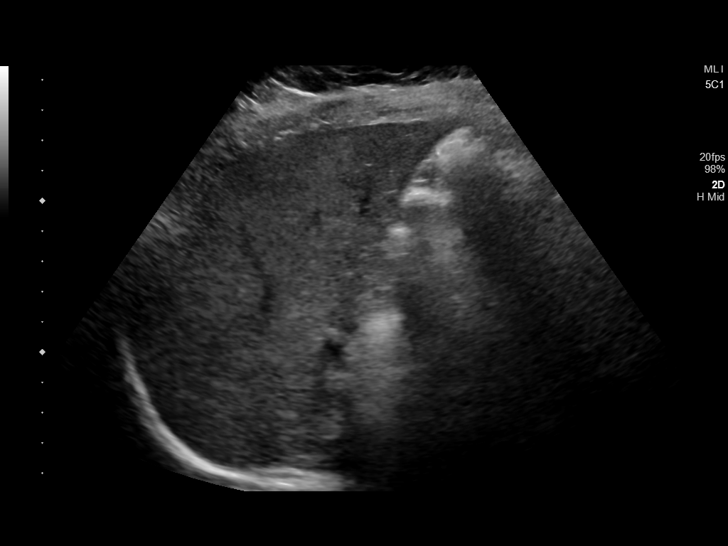
[im 33/87]
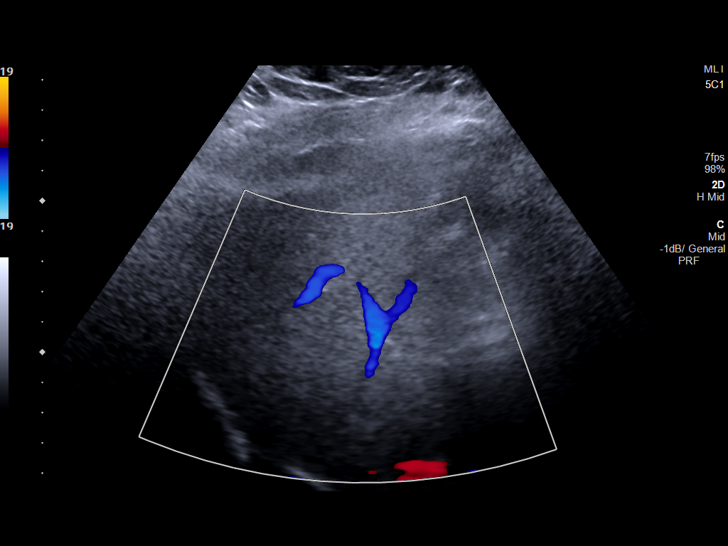
[im 40/87]
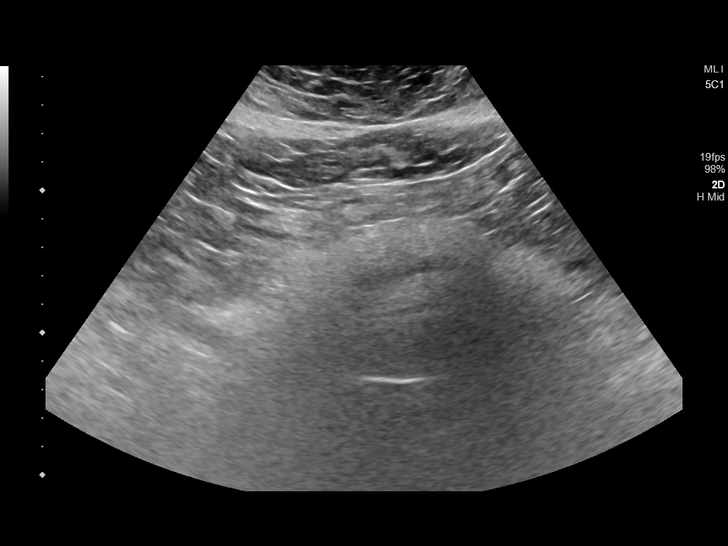
[im 47/87]
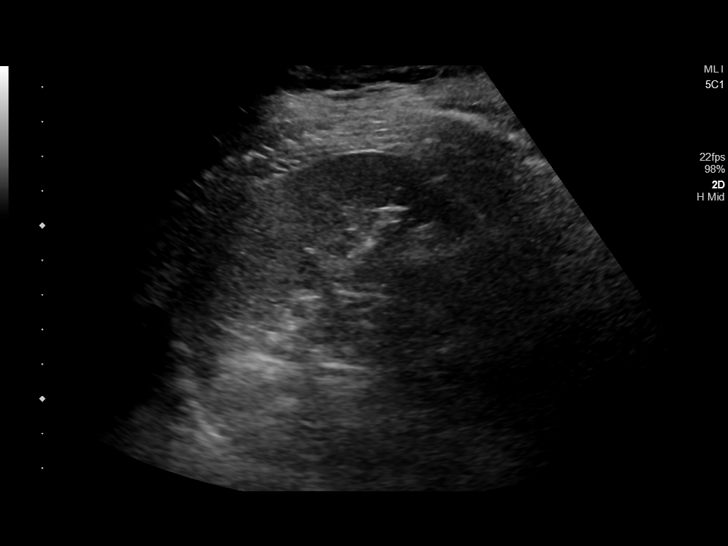
[im 54/87]
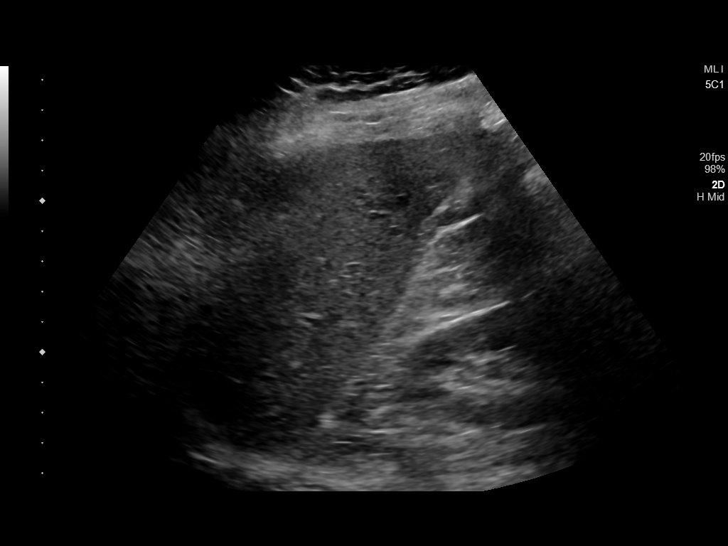
[im 58/87]
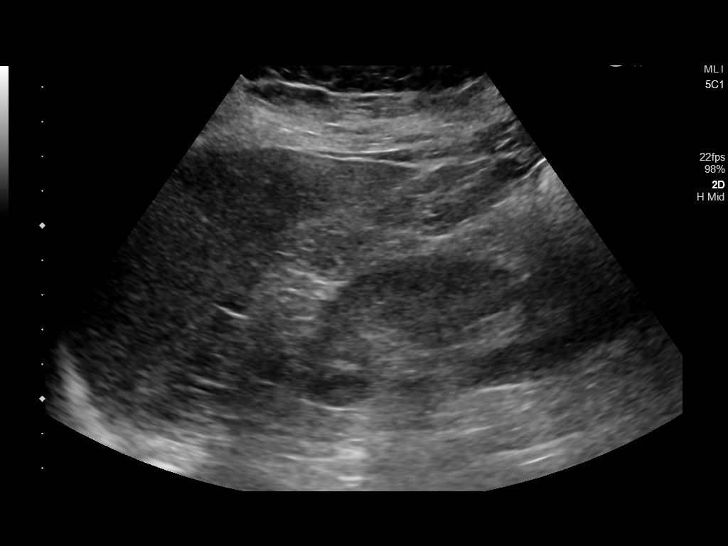
[im 65/87]
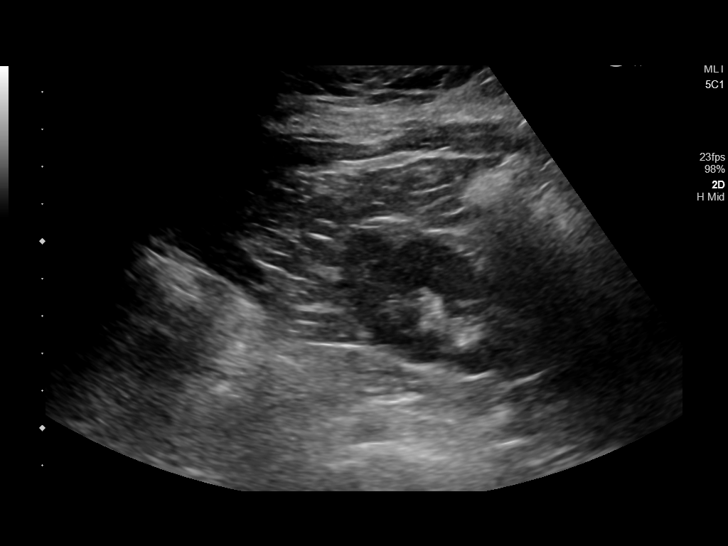
[im 72/87]
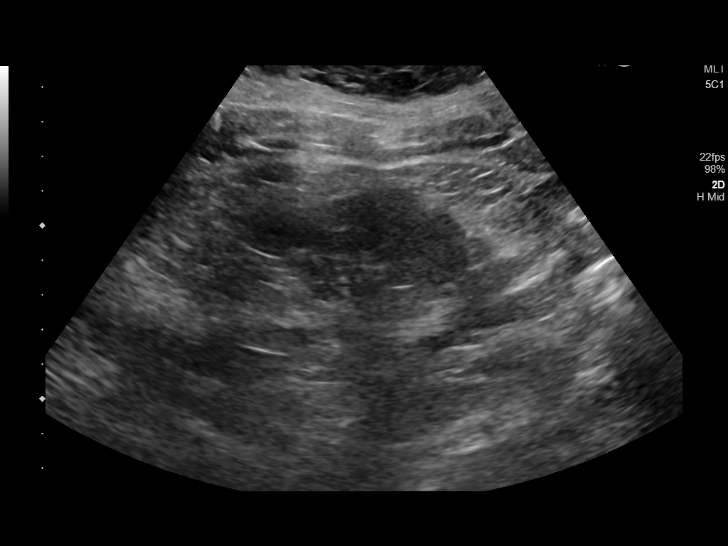
[im 79/87]
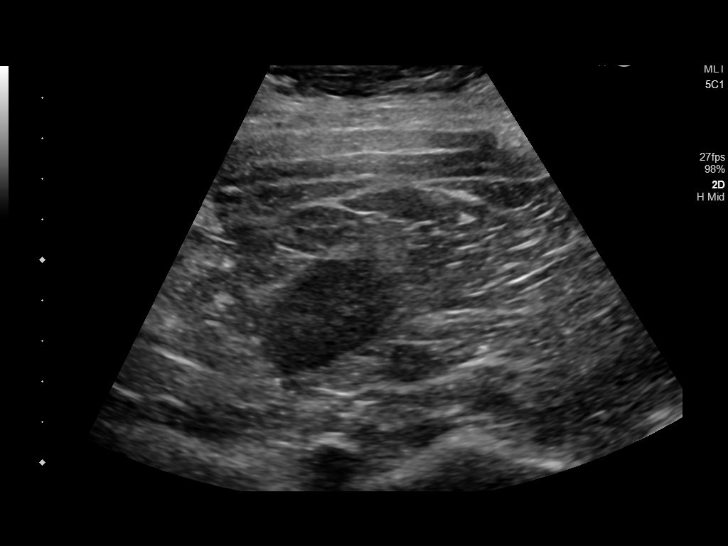
[im 87/87]
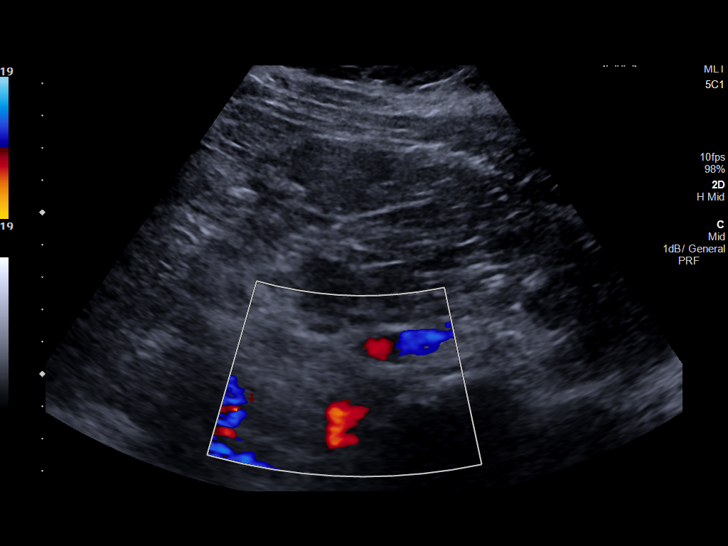

[14 of 25 positions shown; findings below may reference images not displayed]

FINDINGS: Gallbladder: No gallstones or wall thickening visualized. No
sonographic Murphy sign noted by sonographer.

Common bile duct: Diameter: Normal caliber, 5 mm

Liver: No focal lesion identified. Within normal limits in
parenchymal echogenicity. Portal vein is patent on color Doppler
imaging with normal direction of blood flow towards the liver.

IVC: No abnormality visualized.

Pancreas: Visualized portion unremarkable.

Spleen: Size and appearance within normal limits.

Right Kidney: Length: 11.3 cm. Echogenicity within normal limits. No
mass or hydronephrosis visualized.

Left Kidney: Length: 11.0 cm. Echogenicity within normal limits. No
mass or hydronephrosis visualized.

Abdominal aorta: No aneurysm visualized.

Other findings: None.
IMPRESSION: Unremarkable abdominal ultrasound.

## 2018-04-10 IMAGING — US US BREAST*L* LIMITED INC AXILLA
1 series · 5 of 5 positions shown · non-contrast
Comparison: Previous exam(s).

CLINICAL DATA: Follow-up of probably benign left breast
nodule.History of treated right breast cancer, status post
lumpectomy and radiation therapy.

EXAM:
DIGITAL DIAGNOSTIC BILATERAL MAMMOGRAM WITH CAD AND TOMO
ULTRASOUND LEFT BREAST

[Series 1: us breast*left* limited inc axilla · 0.05mm/px · 5 of 5 slices shown]
[im 1/5]
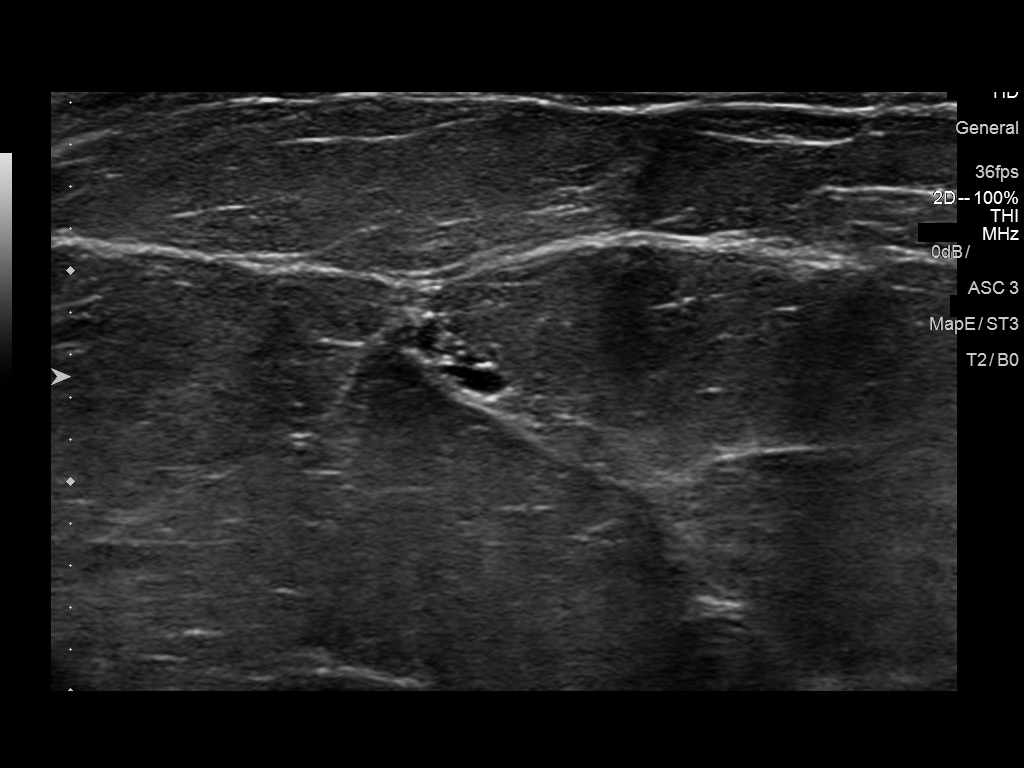
[im 2/5]
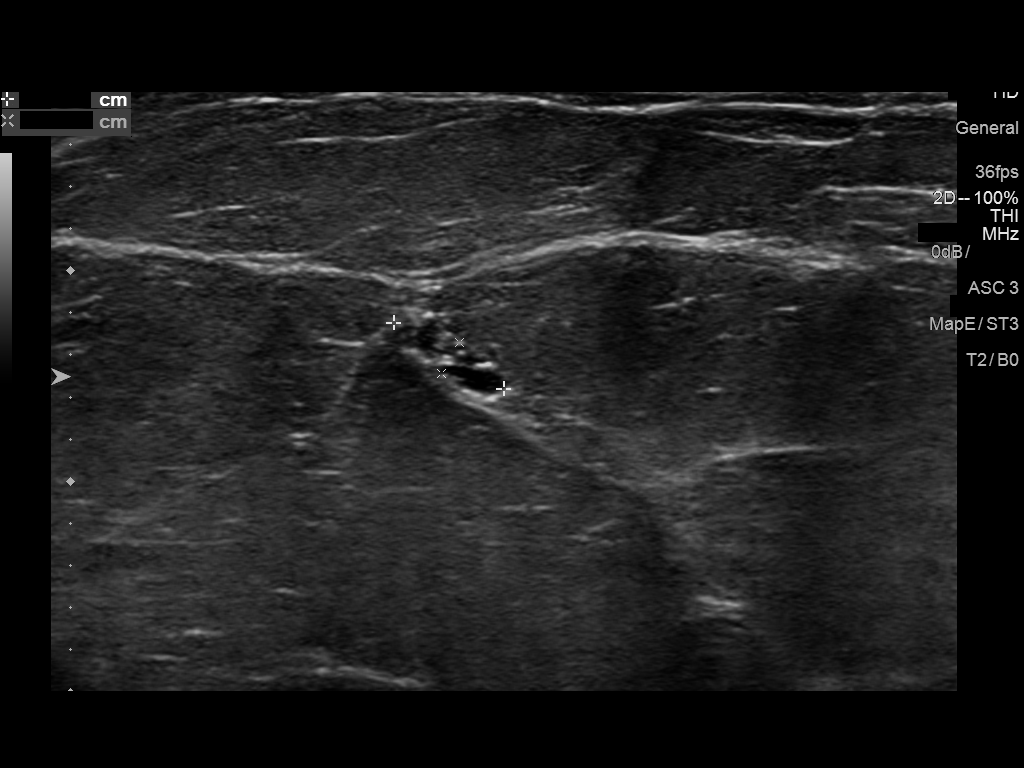
[im 3/5]
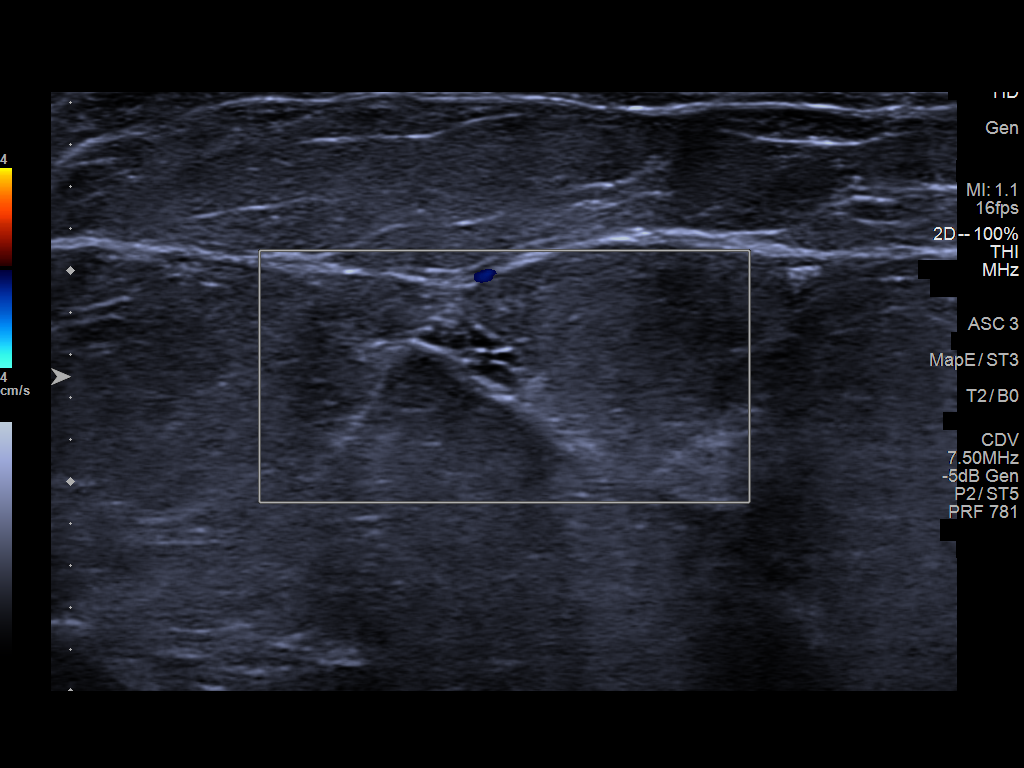
[im 4/5]
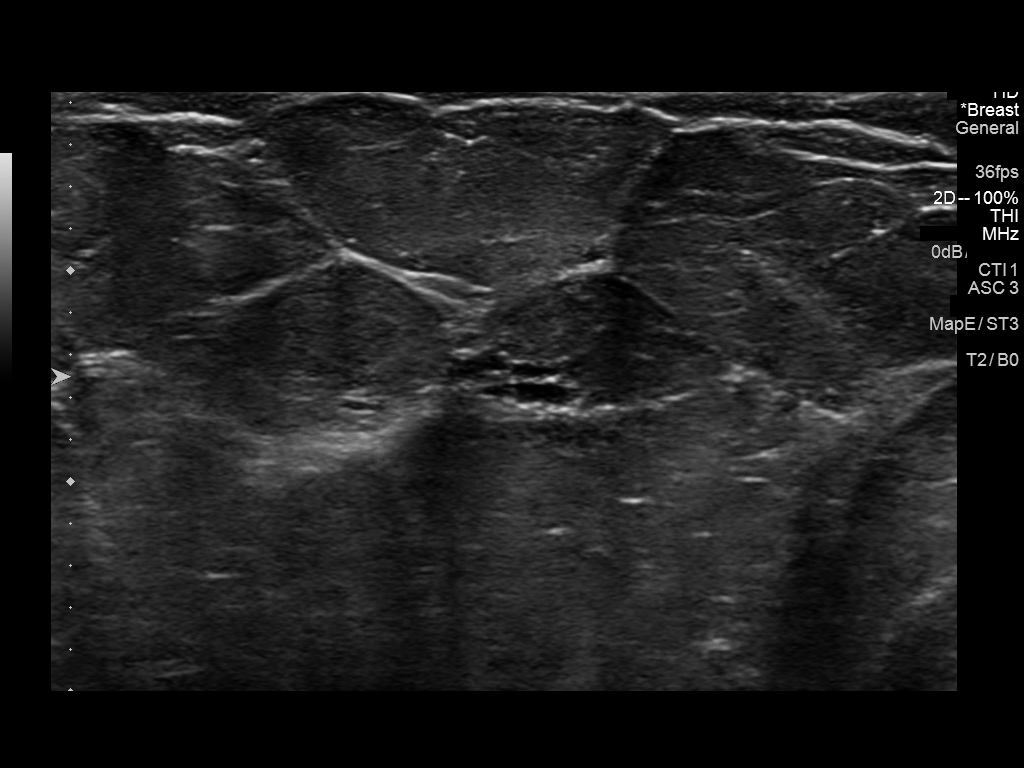
[im 5/5]
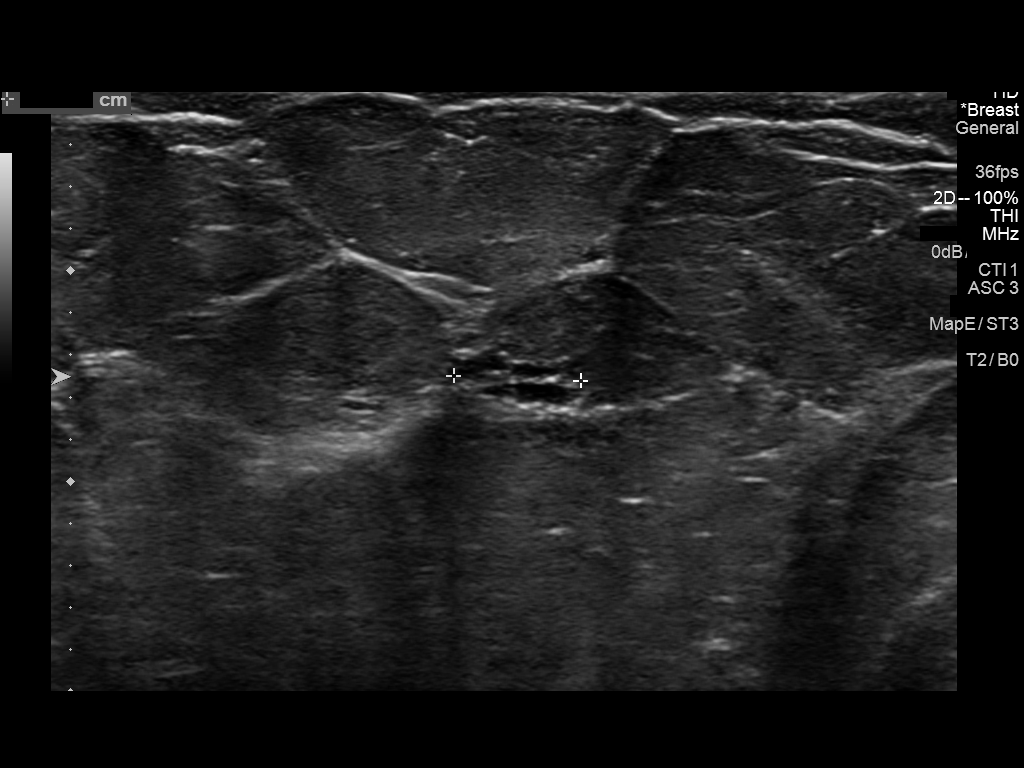

[5 of 5 positions shown; findings below may reference images not displayed]

ACR Breast Density Category b: There are scattered areas of
fibroglandular density.
FINDINGS: Mammographically, there are no suspicious masses, areas of
nonsurgical architectural distortion or microcalcifications in the
right breast. Stable post treatment changes are seen.

In the left breast slightly lower inner quadrant, middle depth,
there is a stable circumscribed 5 mm nodule. A similar appearing
low-density circumscribed 4 mm nodule is seen in the slightly upper
inner left breast, anterior depth.

Mammographic images were processed with CAD.

On physical exam, no suspicious masses are palpated.

Targeted ultrasound is performed, showing left breast 9 o'clock 5 cm
from the nipple a cluster of benign-appearing cysts measuring 0.6 x
0.2 x 0.6 cm. This finding likely corresponds to the
benign-appearing nodule seen in the lower inner left breast. No
correlation is seen to the 4 mm nodule seen in the slightly upper
inner left breast.
IMPRESSION: Two probably benign left breast nodules, for which six-month
follow-up is recommended.

RECOMMENDATION:
Diagnostic mammogram and possibly ultrasound of the left breast in 6
months. (Code:[SO])

I have discussed the findings and recommendations with the patient.
Results were also provided in writing at the conclusion of the
visit. If applicable, a reminder letter will be sent to the patient
regarding the next appointment.

BI-RADS CATEGORY  3: Probably benign.

## 2018-04-28 ENCOUNTER — Other Ambulatory Visit: Payer: Self-pay | Admitting: Nurse Practitioner

## 2018-04-28 DIAGNOSIS — Z86010 Personal history of colonic polyps: Secondary | ICD-10-CM | POA: Diagnosis not present

## 2018-04-28 DIAGNOSIS — R1011 Right upper quadrant pain: Secondary | ICD-10-CM

## 2018-04-28 DIAGNOSIS — K210 Gastro-esophageal reflux disease with esophagitis: Secondary | ICD-10-CM | POA: Diagnosis not present

## 2018-04-29 DIAGNOSIS — Z86010 Personal history of colonic polyps: Secondary | ICD-10-CM | POA: Insufficient documentation

## 2018-05-08 ENCOUNTER — Ambulatory Visit
Admission: RE | Admit: 2018-05-08 | Discharge: 2018-05-08 | Disposition: A | Discharge status: Home / Self Care | Payer: 59 | Source: Ambulatory Visit | Attending: Nurse Practitioner | Admitting: Nurse Practitioner

## 2018-05-08 DIAGNOSIS — R1011 Right upper quadrant pain: Secondary | ICD-10-CM | POA: Insufficient documentation

## 2018-05-08 IMAGING — NM NM HEPATO W/GB/PHARM/[PERSON_NAME]
2 series · 12 of 12 positions shown · non-contrast
Comparison: None.

CLINICAL DATA: Worsening right upper quadrant pain for 3 months.

EXAM:
NUCLEAR MEDICINE HEPATOBILIARY IMAGING WITH GALLBLADDER EF
TECHNIQUE: Sequential images of the abdomen were obtained [DATE] minutes
following intravenous administration of radiopharmaceutical. After
oral ingestion of Ensure, gallbladder ejection fraction was
determined. At 60 min, normal ejection fraction is greater than 33%.
RADIOPHARMACEUTICALS:  5.3 mCi [GU]  Choletec IV

[Series 1000: gallbladder ef · 4.80mm/px · 6 of 120 frames shown]
[frame 11/120]
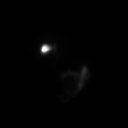
[frame 31/120]
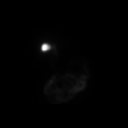
[frame 51/120]
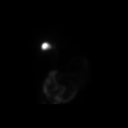
[frame 71/120]
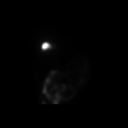
[frame 91/120]
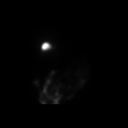
[frame 111/120]
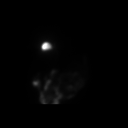

[Series 1000: hepatobiliary scan · 9.59mm/px · 6 of 60 frames shown]
[frame 6/60]
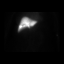
[frame 16/60]
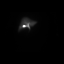
[frame 26/60]
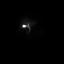
[frame 36/60]
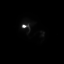
[frame 46/60]
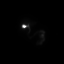
[frame 56/60]
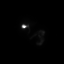

[12 of 12 positions shown; findings below may reference images not displayed]

FINDINGS: Prompt uptake and biliary excretion of activity by the liver is
seen. Gallbladder activity is visualized, consistent with patency of
cystic duct. Biliary activity passes into small bowel, consistent
with patent common bile duct.

Calculated gallbladder ejection fraction is 41%. (Normal gallbladder
ejection fraction with Ensure is greater than 33%.)
IMPRESSION: No evidence of acute cholecystitis or biliary obstruction.

Normal gallbladder ejection fraction.

## 2018-05-08 MED ORDER — TECHNETIUM TC 99M MEBROFENIN IV KIT
5.0000 | PACK | Freq: Once | INTRAVENOUS | Status: AC | PRN
  Administered 2018-05-08: 5.277 via INTRAVENOUS

## 2018-07-01 DIAGNOSIS — K210 Gastro-esophageal reflux disease with esophagitis: Secondary | ICD-10-CM | POA: Diagnosis not present

## 2018-08-05 DIAGNOSIS — H5213 Myopia, bilateral: Secondary | ICD-10-CM | POA: Diagnosis not present

## 2018-08-11 ENCOUNTER — Ambulatory Visit
Admission: RE | Admit: 2018-08-11 | Discharge: 2018-08-11 | Disposition: A | Discharge status: Home / Self Care | Payer: 59 | Source: Ambulatory Visit | Attending: Internal Medicine | Admitting: Internal Medicine

## 2018-08-11 DIAGNOSIS — N63 Unspecified lump in unspecified breast: Secondary | ICD-10-CM

## 2018-08-11 DIAGNOSIS — N6489 Other specified disorders of breast: Secondary | ICD-10-CM | POA: Diagnosis not present

## 2018-08-11 DIAGNOSIS — R928 Other abnormal and inconclusive findings on diagnostic imaging of breast: Secondary | ICD-10-CM | POA: Diagnosis not present

## 2018-08-11 IMAGING — MG MM DIGITAL DIAGNOSTIC UNILAT*L* W/ TOMO W/ CAD
7 series · 8 of 19 positions shown · non-contrast
Comparison: Previous exam(s).

CLINICAL DATA: Short-term interval follow-up of probable benign
findings in the left breast.

EXAM:
DIGITAL DIAGNOSTIC LEFT MAMMOGRAM WITH CAD AND TOMO
ULTRASOUND LEFT BREAST

[L CC synth-2D (1 of 2)]
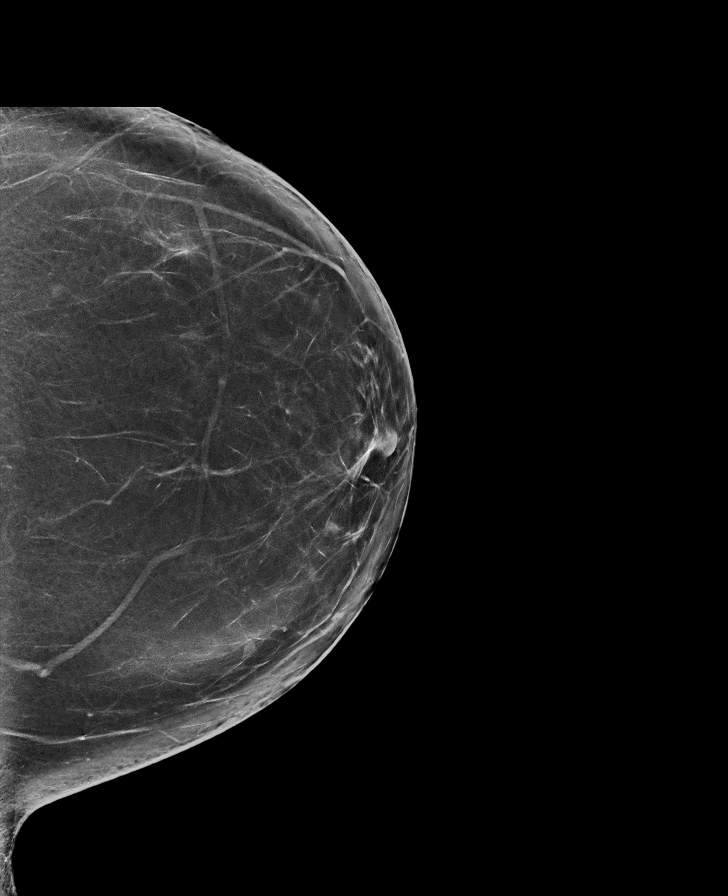

[L CC synth-2D (2 of 2)]
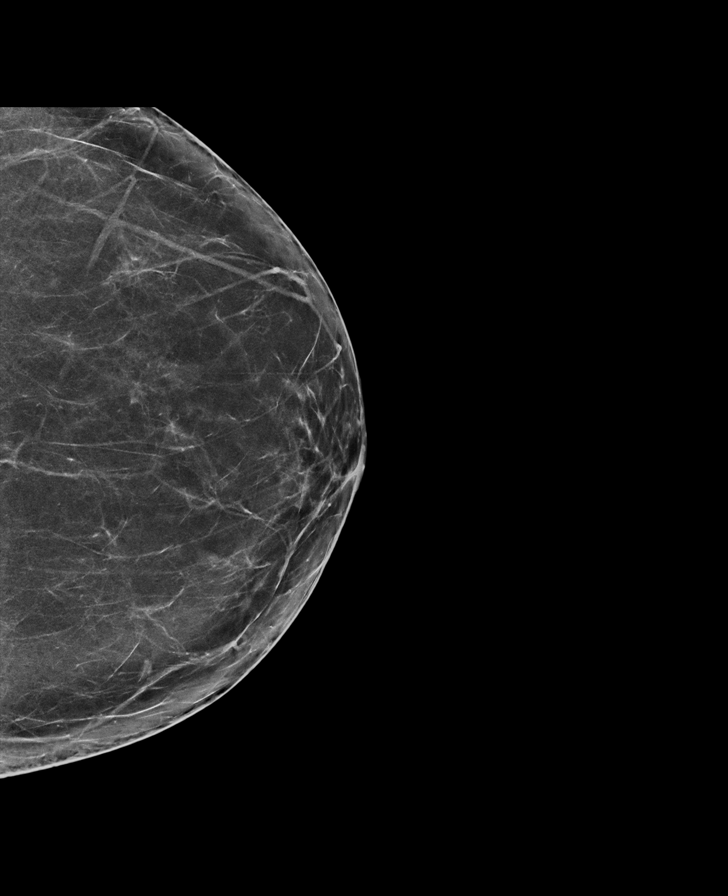

[L MLO synth-2D]
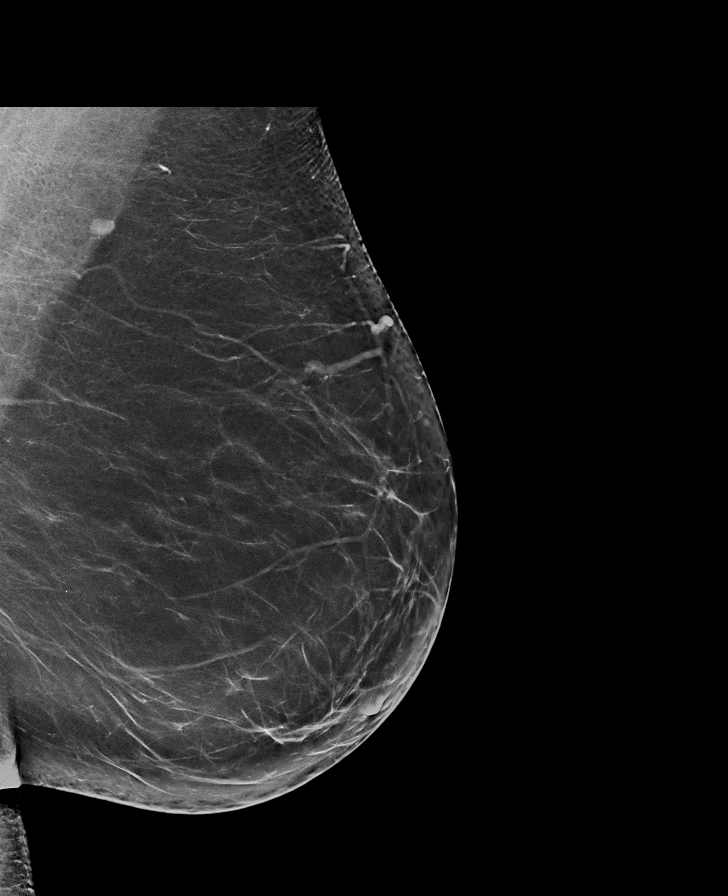

[L MLO tomo · 2 of 88 frames shown]
[frame 29/88]
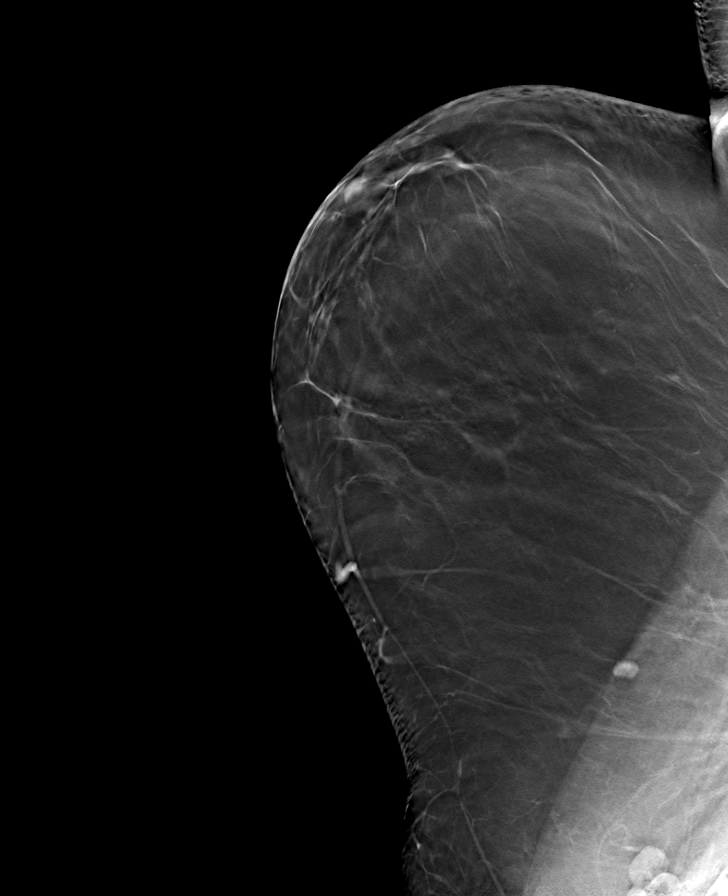
[frame 45/88]
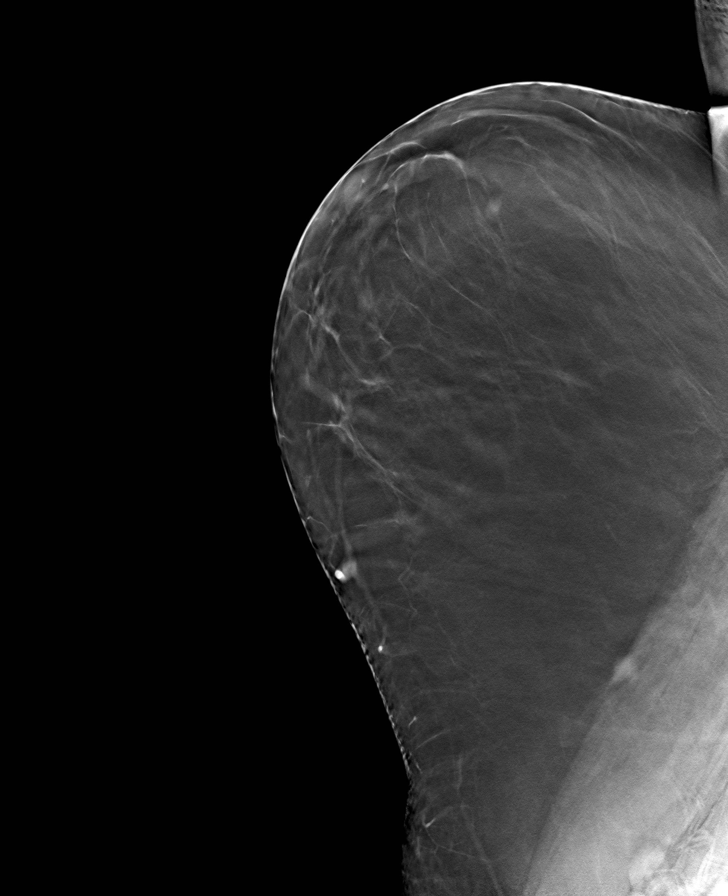

[L CC tomo (1 of 2) · tomo slice 37/73.0]
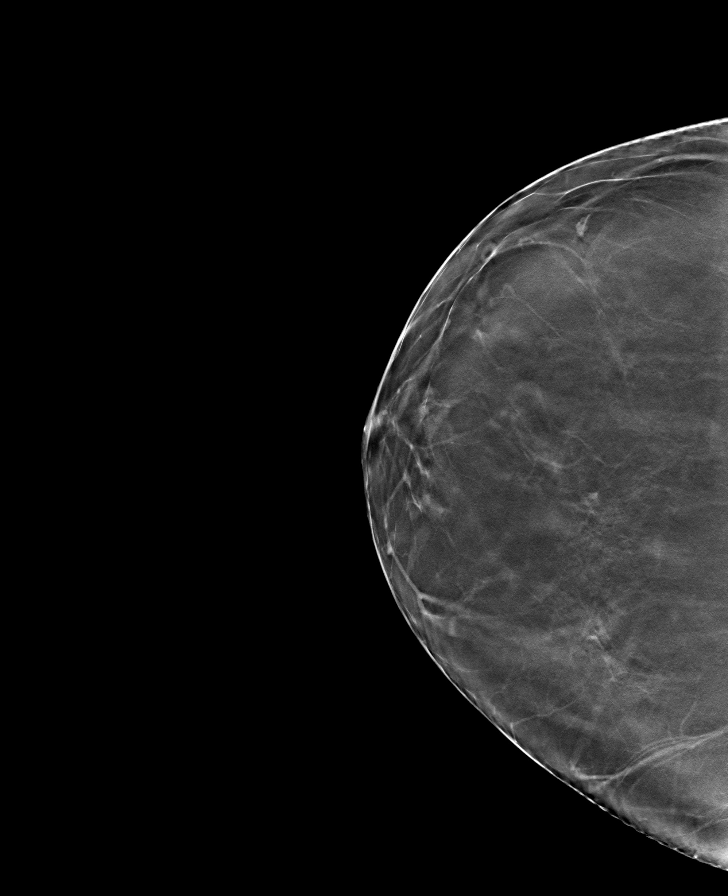

[L CC tomo (2 of 2) · tomo slice 43/84.0]
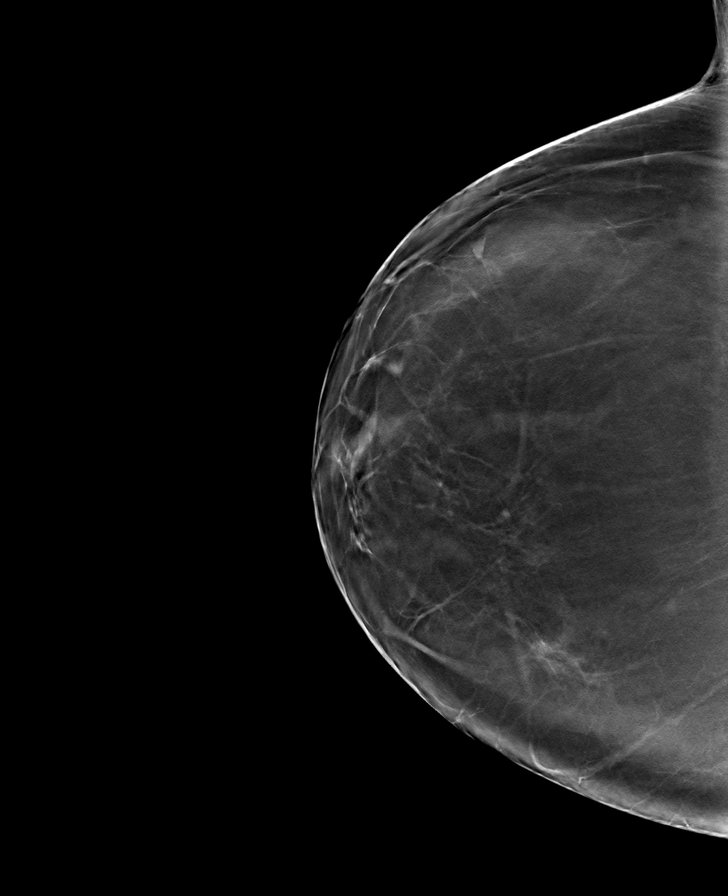

[L CC]
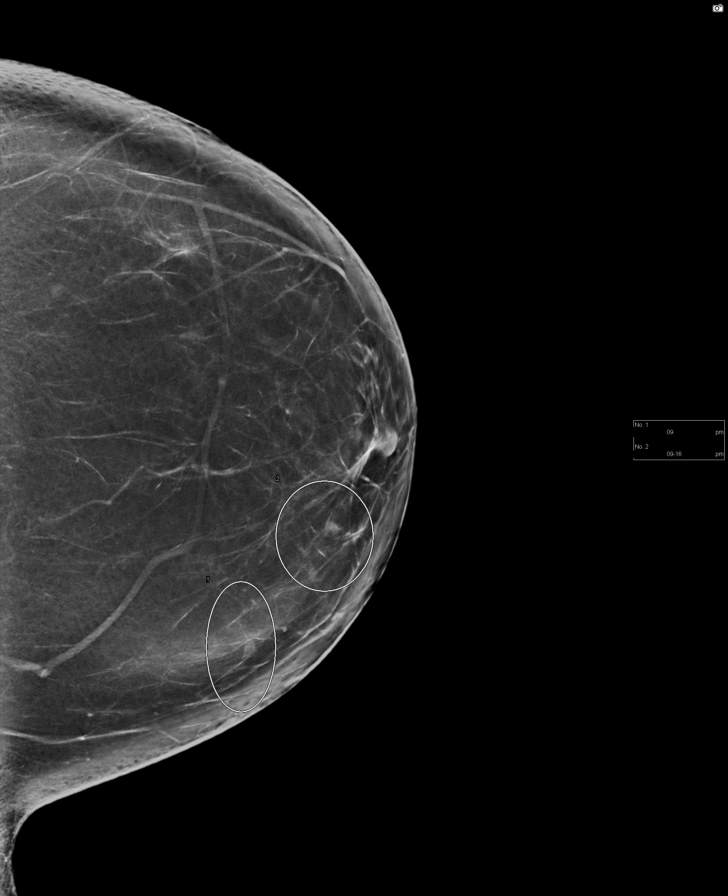

[8 of 19 positions shown; findings below may reference images not displayed]

ACR Breast Density Category b: There are scattered areas of
fibroglandular density.
FINDINGS: Low-density nodules in the medial aspect of the left breast have a
stable appearance compared to prior exams. No new suspicious mass or
malignant type microcalcifications identified.

Mammographic images were processed with CAD.

On physical exam, no mass is palpated in the medial aspect of the
left breast.

Targeted ultrasound is performed, showing stable probable apocrine
metaplasia at 9 o'clock 5 cm from the nipple measuring 6 x 2 x 5 mm.
On the prior ultrasound dated [DATE] it measured 6 x 2 x 5 mm.
IMPRESSION: Stable probable benign findings in the left breast.

RECOMMENDATION:
Bilateral diagnostic mammogram and left breast ultrasound in 6
months is recommended to complete 2 year follow-up.

I have discussed the findings and recommendations with the patient.
Results were also provided in writing at the conclusion of the
visit. If applicable, a reminder letter will be sent to the patient
regarding the next appointment.

BI-RADS CATEGORY  3: Probably benign.

## 2018-10-14 IMAGING — US US BREAST*L* LIMITED INC AXILLA
1 series · 6 of 6 positions shown · non-contrast
Comparison: Previous exam(s).

CLINICAL DATA: Short-term interval follow-up of probable benign
findings in the left breast.

EXAM:
DIGITAL DIAGNOSTIC LEFT MAMMOGRAM WITH CAD AND TOMO
ULTRASOUND LEFT BREAST

[Series 1: us breast*left* limited inc axilla · 0.05mm/px · 6 of 6 slices shown]
[im 1/6]
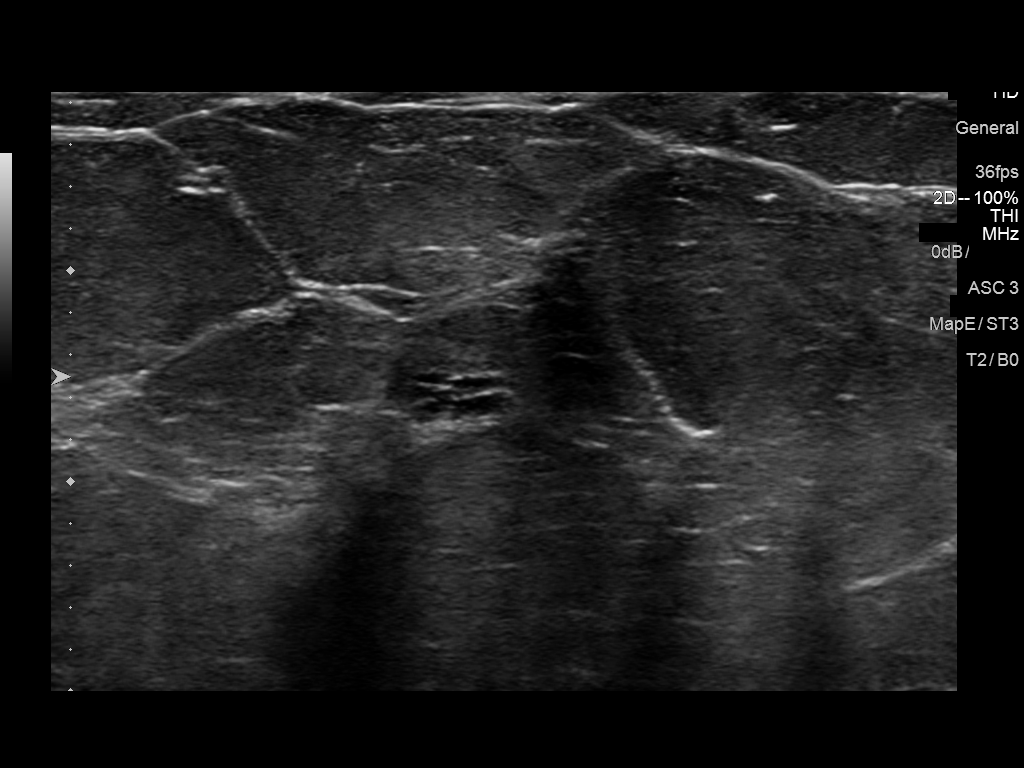
[im 2/6]
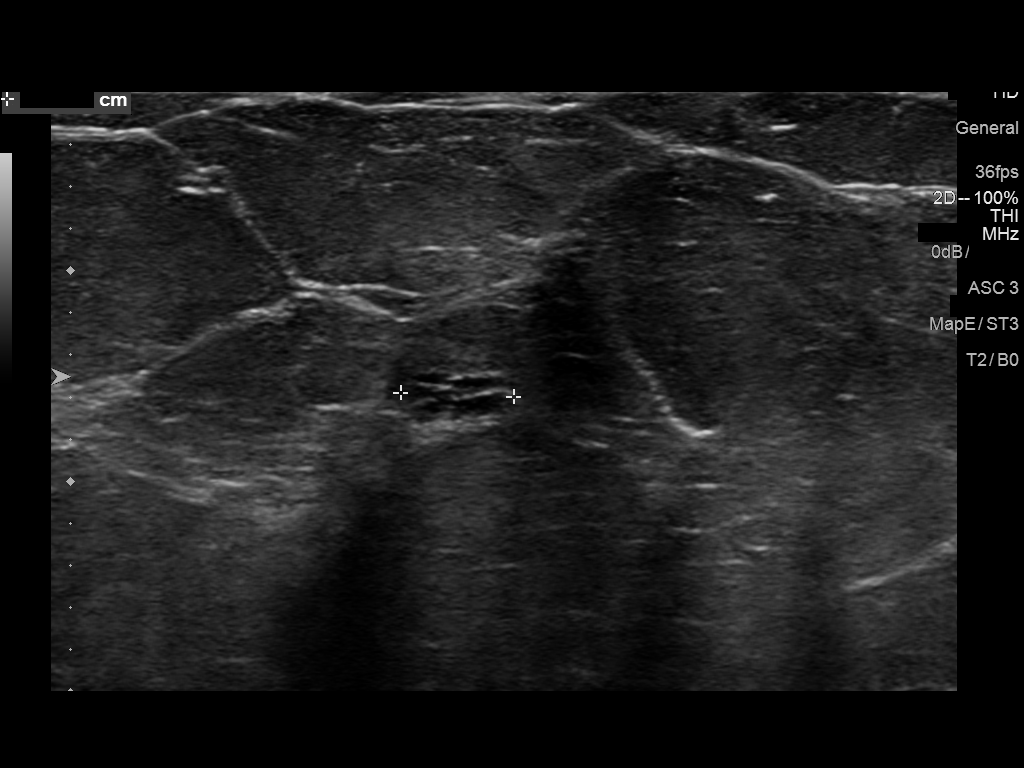
[im 3/6]
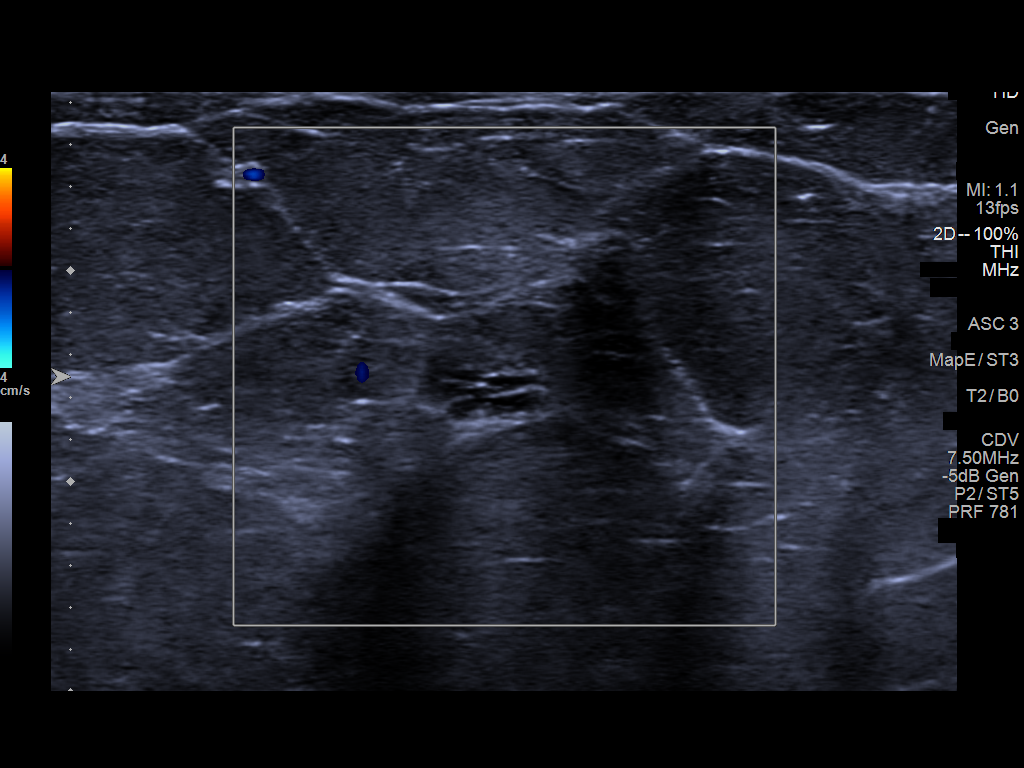
[im 4/6]
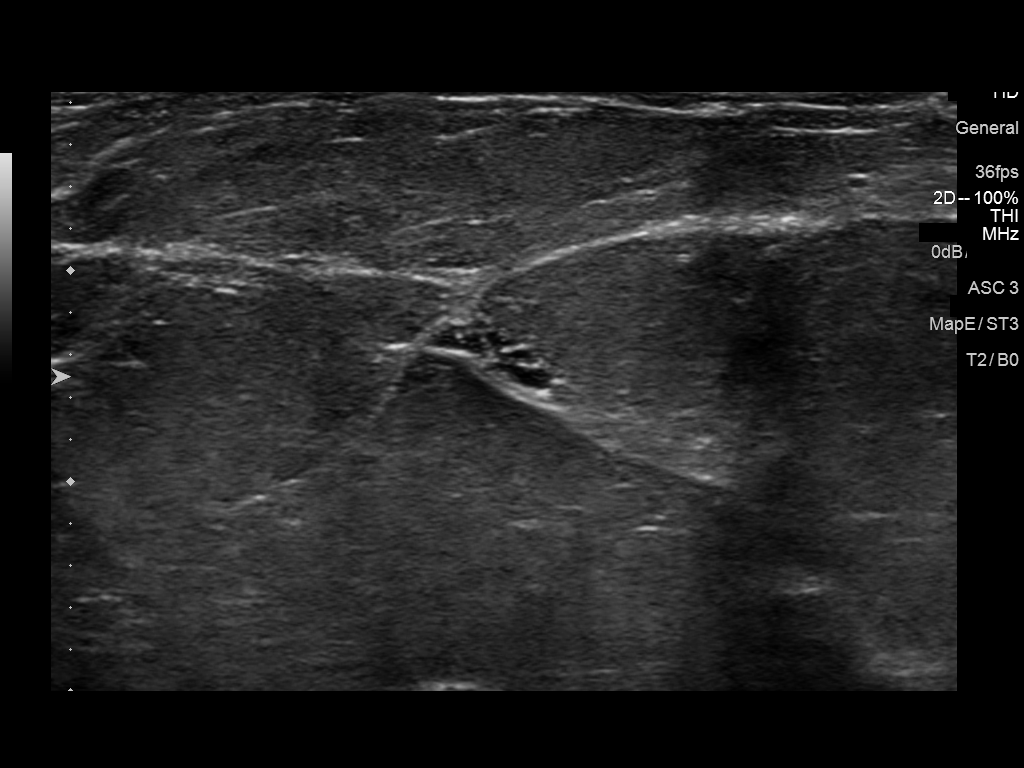
[im 5/6]
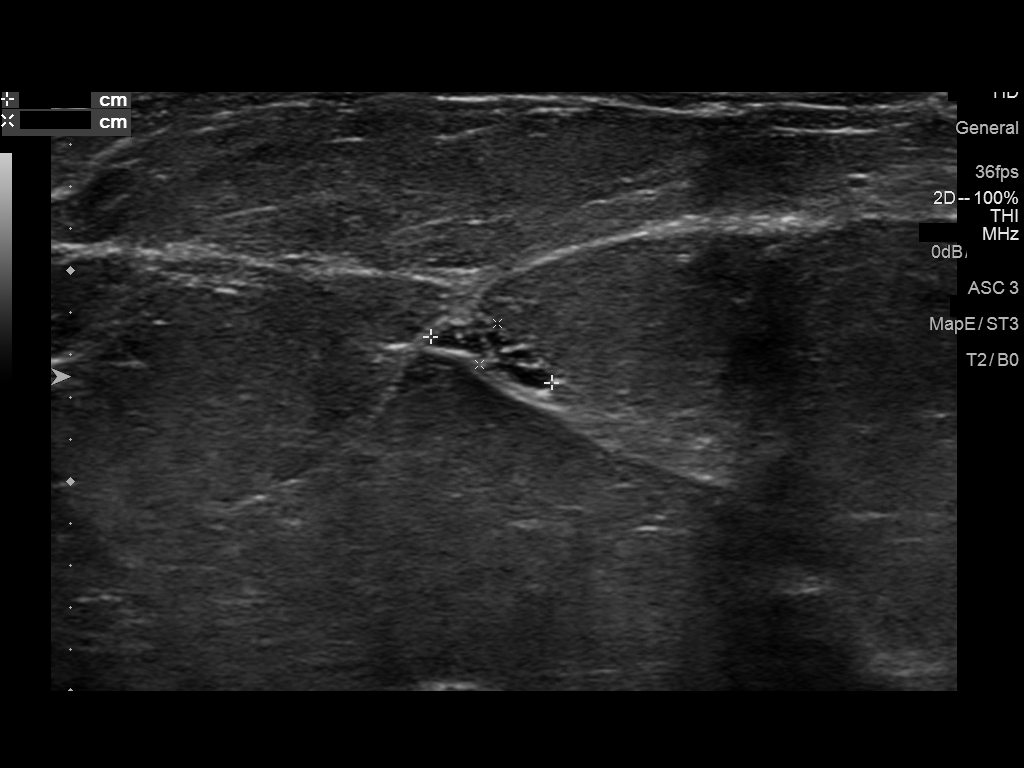
[im 6/6]
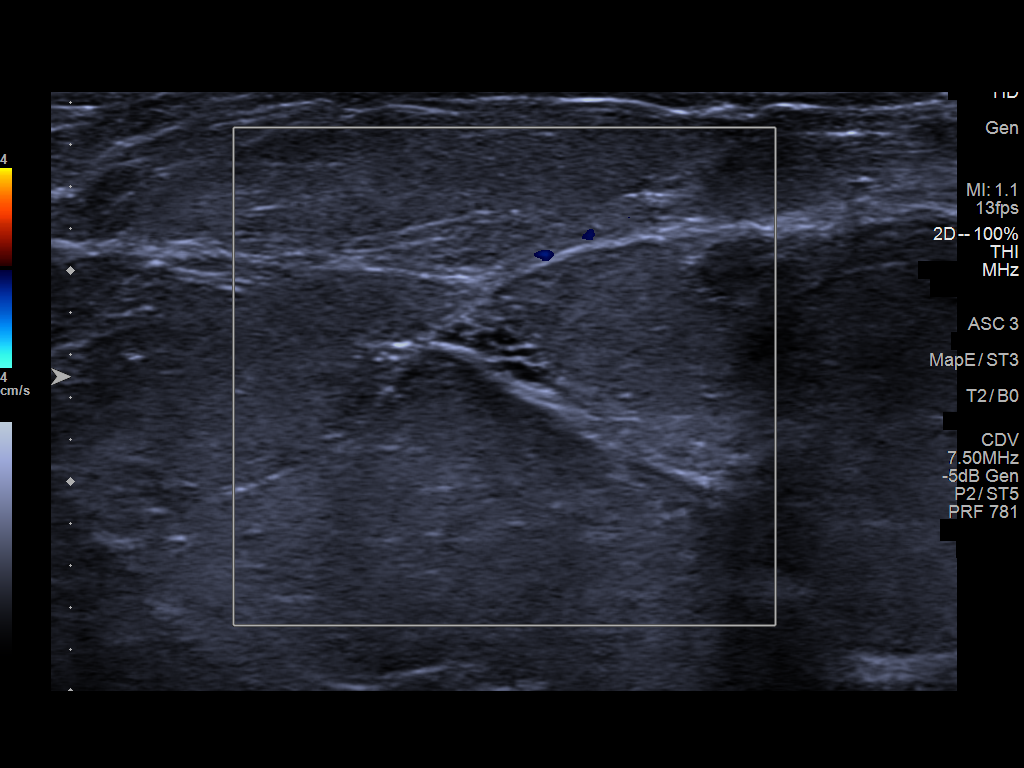

[6 of 6 positions shown; findings below may reference images not displayed]

ACR Breast Density Category b: There are scattered areas of
fibroglandular density.
FINDINGS: Low-density nodules in the medial aspect of the left breast have a
stable appearance compared to prior exams. No new suspicious mass or
malignant type microcalcifications identified.

Mammographic images were processed with CAD.

On physical exam, no mass is palpated in the medial aspect of the
left breast.

Targeted ultrasound is performed, showing stable probable apocrine
metaplasia at 9 o'clock 5 cm from the nipple measuring 6 x 2 x 5 mm.
On the prior ultrasound dated [DATE] it measured 6 x 2 x 5 mm.
IMPRESSION: Stable probable benign findings in the left breast.

RECOMMENDATION:
Bilateral diagnostic mammogram and left breast ultrasound in 6
months is recommended to complete 2 year follow-up.

I have discussed the findings and recommendations with the patient.
Results were also provided in writing at the conclusion of the
visit. If applicable, a reminder letter will be sent to the patient
regarding the next appointment.

BI-RADS CATEGORY  3: Probably benign.

## 2019-01-09 ENCOUNTER — Other Ambulatory Visit: Payer: Self-pay | Admitting: Internal Medicine

## 2019-01-09 DIAGNOSIS — Z87440 Personal history of urinary (tract) infections: Secondary | ICD-10-CM | POA: Diagnosis not present

## 2019-01-09 DIAGNOSIS — Z0000 Encounter for general adult medical examination without abnormal findings: Secondary | ICD-10-CM | POA: Diagnosis not present

## 2019-01-09 DIAGNOSIS — E785 Hyperlipidemia, unspecified: Secondary | ICD-10-CM | POA: Diagnosis not present

## 2019-01-09 DIAGNOSIS — K210 Gastro-esophageal reflux disease with esophagitis: Secondary | ICD-10-CM | POA: Diagnosis not present

## 2019-01-09 DIAGNOSIS — N63 Unspecified lump in unspecified breast: Secondary | ICD-10-CM

## 2019-02-10 ENCOUNTER — Ambulatory Visit
Admission: RE | Admit: 2019-02-10 | Discharge: 2019-02-10 | Disposition: A | Discharge status: Home / Self Care | Payer: 59 | Source: Ambulatory Visit | Attending: Internal Medicine | Admitting: Internal Medicine

## 2019-02-10 ENCOUNTER — Other Ambulatory Visit: Payer: Self-pay

## 2019-02-10 DIAGNOSIS — N63 Unspecified lump in unspecified breast: Secondary | ICD-10-CM

## 2019-02-10 DIAGNOSIS — R928 Other abnormal and inconclusive findings on diagnostic imaging of breast: Secondary | ICD-10-CM | POA: Diagnosis not present

## 2019-02-10 IMAGING — MG DIGITAL DIAGNOSTIC BILATERAL MAMMOGRAM WITH TOMO AND CAD
6 of 10 series · 6 of 30 positions shown · non-contrast
Comparison: Previous exam(s).

CLINICAL DATA: Two year follow-up of probably benign left breast
nodule. History of treated right breast cancer, status post
lumpectomy in [J8].

EXAM:
DIGITAL DIAGNOSTIC BILATERAL MAMMOGRAM WITH CAD AND TOMO

[R MLO synth-2D (1 of 2)]
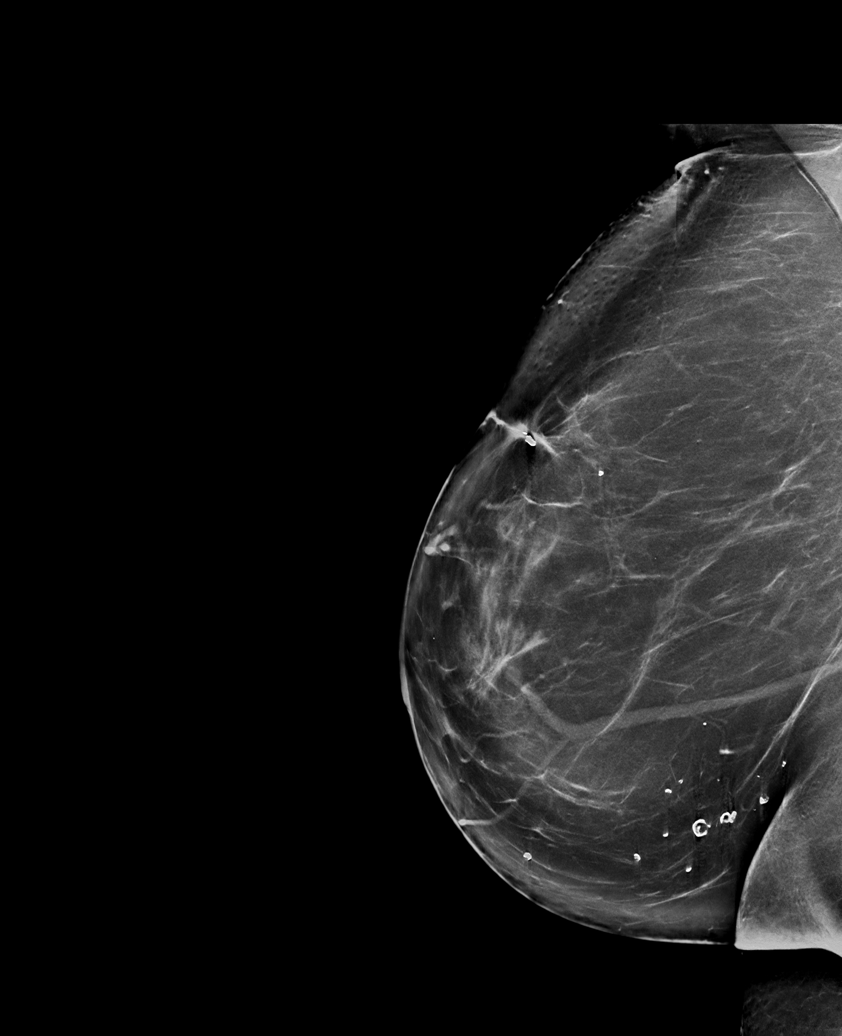

[L CC synth-2D]
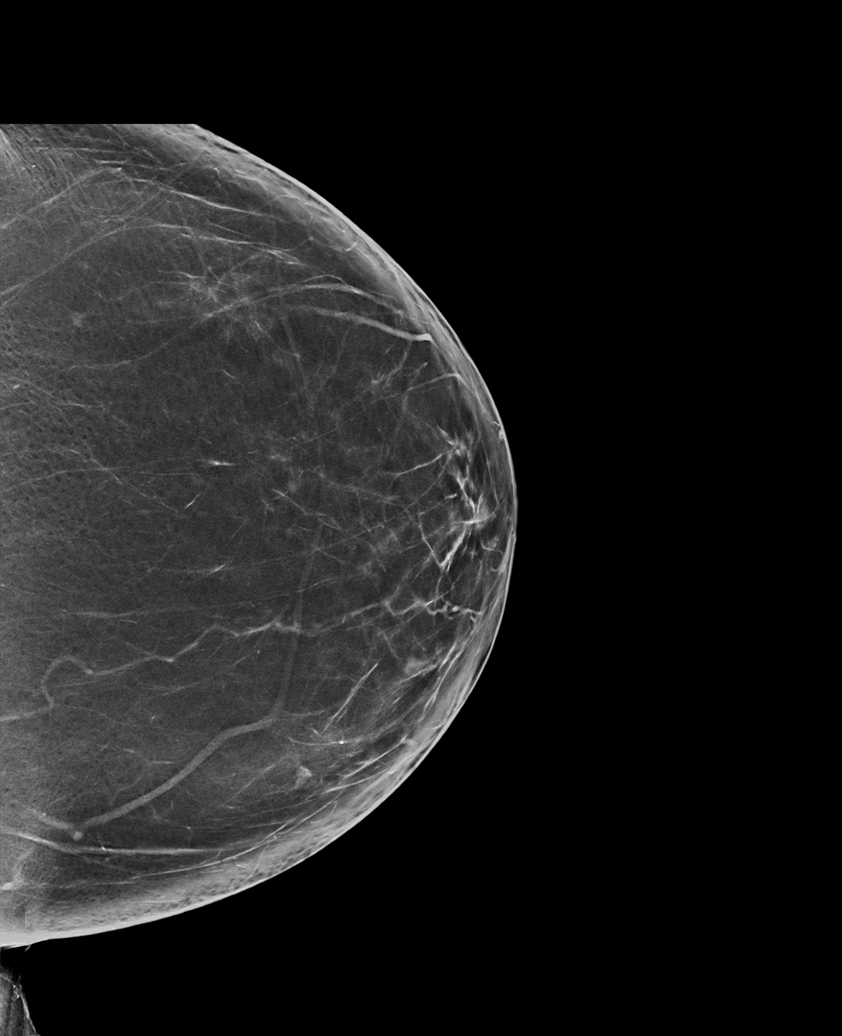

[R CC synth-2D]
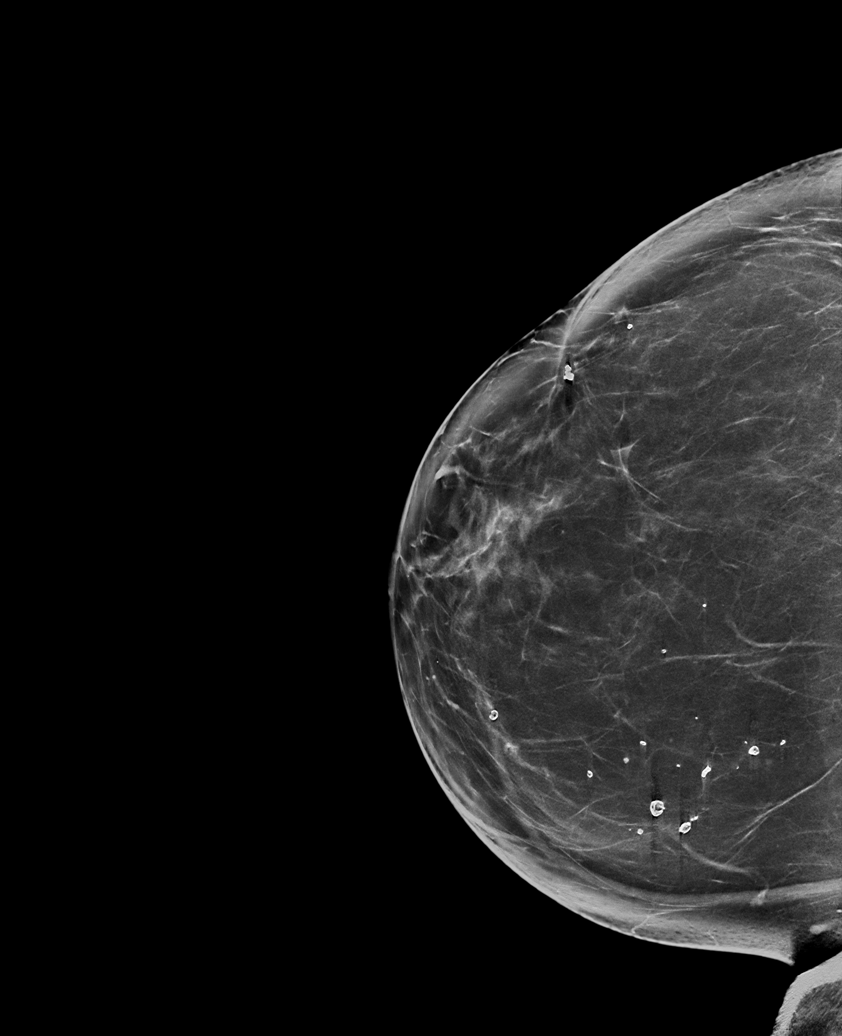

[R MLO synth-2D (2 of 2)]
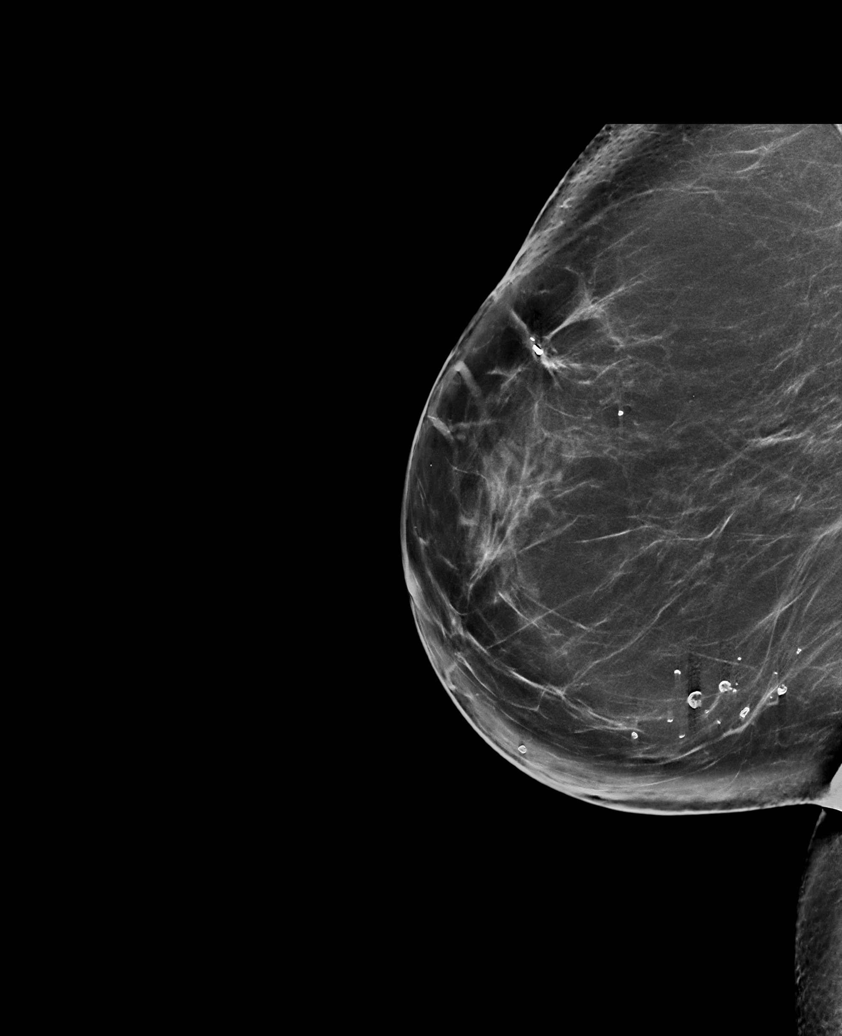

[L MLO synth-2D]
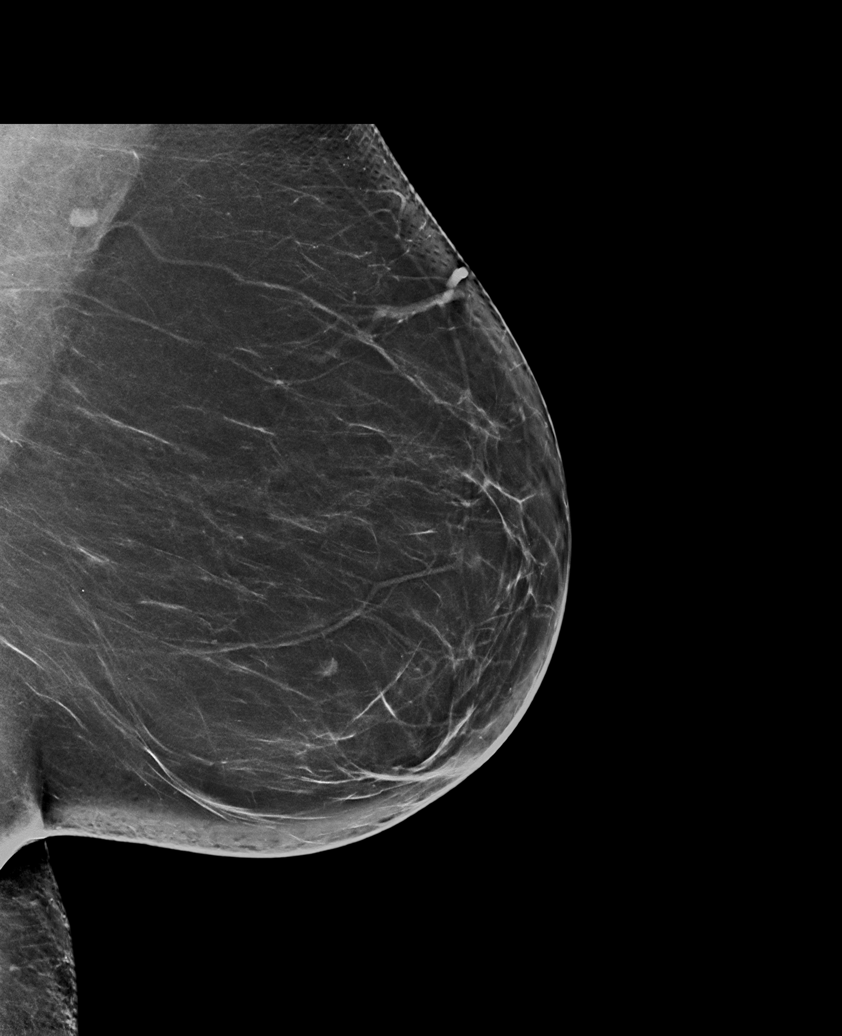

[R CC tomo · tomo slice 43/85.0]
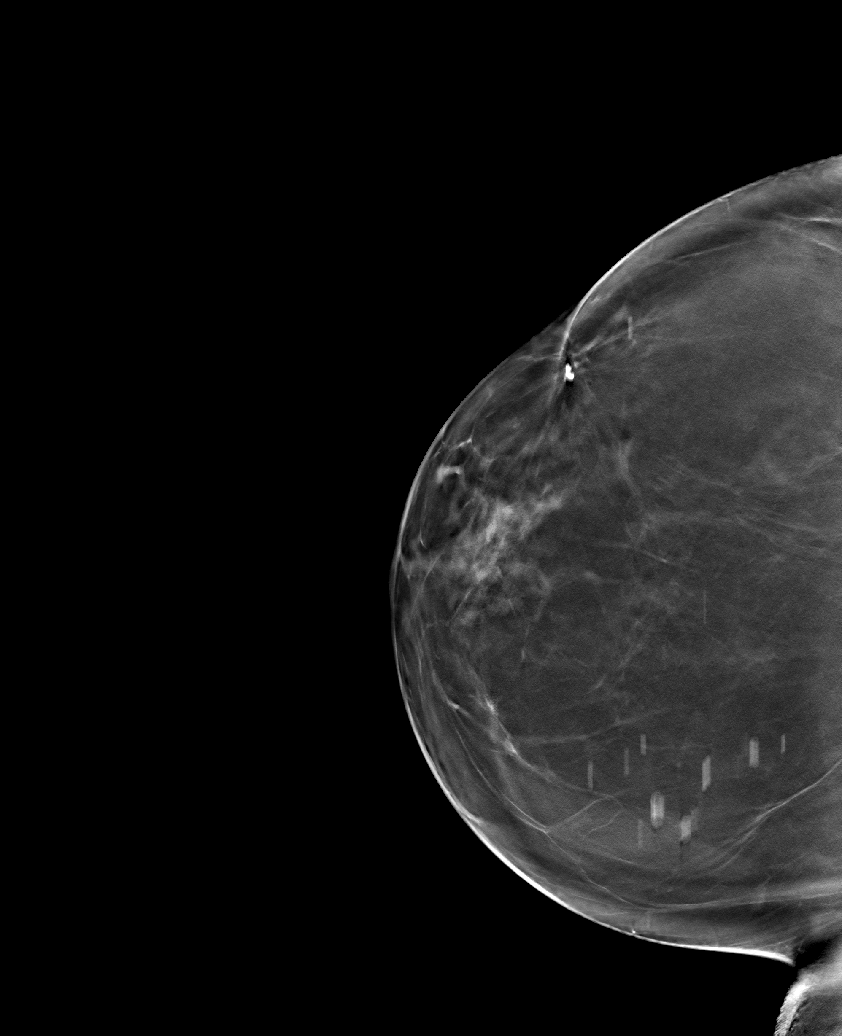

[6 of 30 positions shown; findings below may reference images not displayed]

ACR Breast Density Category b: There are scattered areas of
fibroglandular density.
FINDINGS: Mammographically, there are no suspicious masses, areas of
nonsurgical architectural distortion or microcalcifications in
either breast. Stable postsurgical changes from right breast
lumpectomy. There are 2 stable less than 1 cm nodules in the left
inner breast, middle depth. Their stability from [J8] of forms
benign nature.

Mammographic images were processed with CAD.
IMPRESSION: No mammographic sonographic evidence of malignancy in either breast.

RECOMMENDATION:
Screening mammogram in one year.(Code:[J8])

I have discussed the findings and recommendations with the patient.
Results were also provided in writing at the conclusion of the
visit. If applicable, a reminder letter will be sent to the patient
regarding the next appointment.

BI-RADS CATEGORY  2: Benign.

## 2019-05-26 DIAGNOSIS — H9319 Tinnitus, unspecified ear: Secondary | ICD-10-CM | POA: Diagnosis not present

## 2019-05-26 DIAGNOSIS — J301 Allergic rhinitis due to pollen: Secondary | ICD-10-CM | POA: Diagnosis not present

## 2020-01-04 DIAGNOSIS — E785 Hyperlipidemia, unspecified: Secondary | ICD-10-CM | POA: Diagnosis not present

## 2020-01-04 DIAGNOSIS — E559 Vitamin D deficiency, unspecified: Secondary | ICD-10-CM | POA: Diagnosis not present

## 2020-01-04 DIAGNOSIS — Z79899 Other long term (current) drug therapy: Secondary | ICD-10-CM | POA: Diagnosis not present

## 2020-01-11 ENCOUNTER — Other Ambulatory Visit: Payer: Self-pay | Admitting: Internal Medicine

## 2020-01-11 DIAGNOSIS — E785 Hyperlipidemia, unspecified: Secondary | ICD-10-CM | POA: Diagnosis not present

## 2020-01-11 DIAGNOSIS — D649 Anemia, unspecified: Secondary | ICD-10-CM | POA: Diagnosis not present

## 2020-01-11 DIAGNOSIS — Z1231 Encounter for screening mammogram for malignant neoplasm of breast: Secondary | ICD-10-CM

## 2020-01-11 DIAGNOSIS — Z0000 Encounter for general adult medical examination without abnormal findings: Secondary | ICD-10-CM | POA: Diagnosis not present

## 2020-01-11 DIAGNOSIS — Z124 Encounter for screening for malignant neoplasm of cervix: Secondary | ICD-10-CM | POA: Diagnosis not present

## 2020-01-11 DIAGNOSIS — Z79899 Other long term (current) drug therapy: Secondary | ICD-10-CM | POA: Diagnosis not present

## 2020-01-11 DIAGNOSIS — Z87440 Personal history of urinary (tract) infections: Secondary | ICD-10-CM | POA: Diagnosis not present

## 2020-02-11 ENCOUNTER — Ambulatory Visit
Admission: RE | Admit: 2020-02-11 | Discharge: 2020-02-11 | Disposition: A | Discharge status: Home / Self Care | Payer: 59 | Source: Ambulatory Visit | Attending: Internal Medicine | Admitting: Internal Medicine

## 2020-02-11 DIAGNOSIS — Z1231 Encounter for screening mammogram for malignant neoplasm of breast: Secondary | ICD-10-CM | POA: Diagnosis not present

## 2020-02-11 IMAGING — MG DIGITAL SCREENING BILAT W/ TOMO W/ CAD
8 series · 8 of 24 positions shown · non-contrast
Comparison: Previous exam(s).

CLINICAL DATA: Screening.

EXAM:
DIGITAL SCREENING BILATERAL MAMMOGRAM WITH TOMO AND CAD

[R MLO synth-2D]
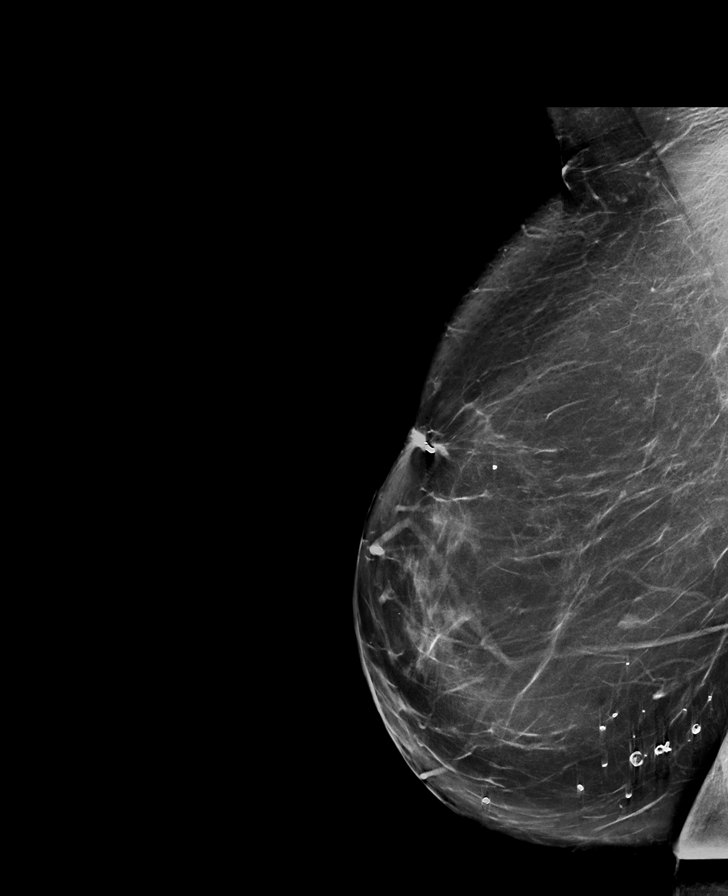

[L CC synth-2D]
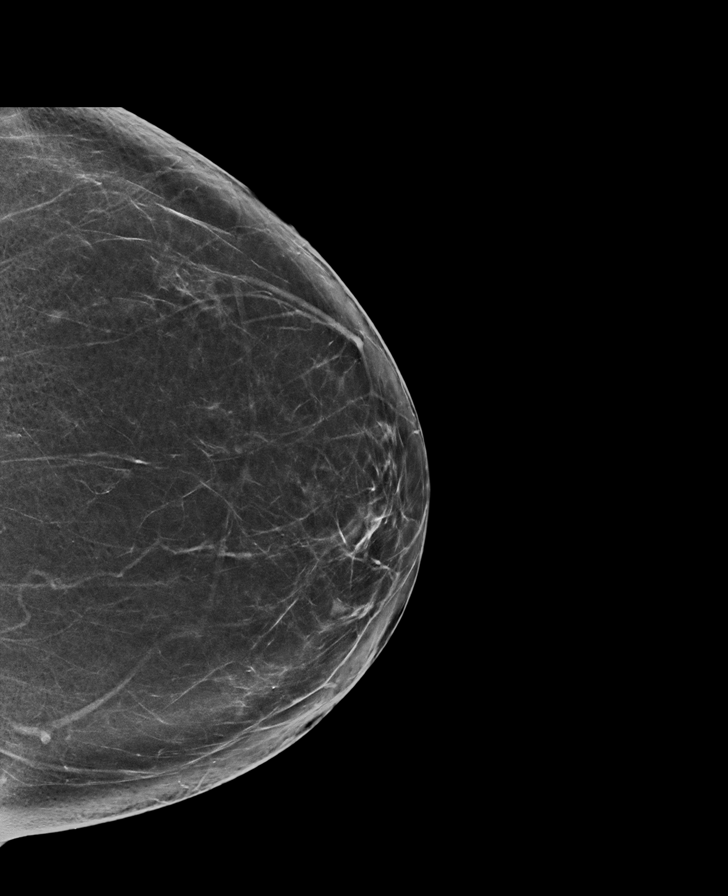

[L MLO synth-2D]
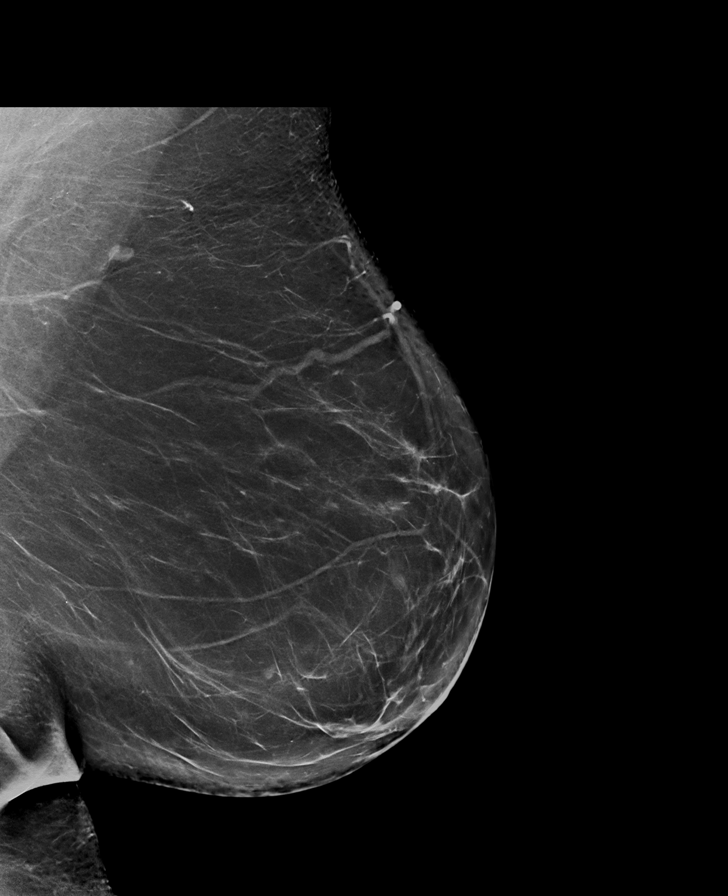

[R CC synth-2D]
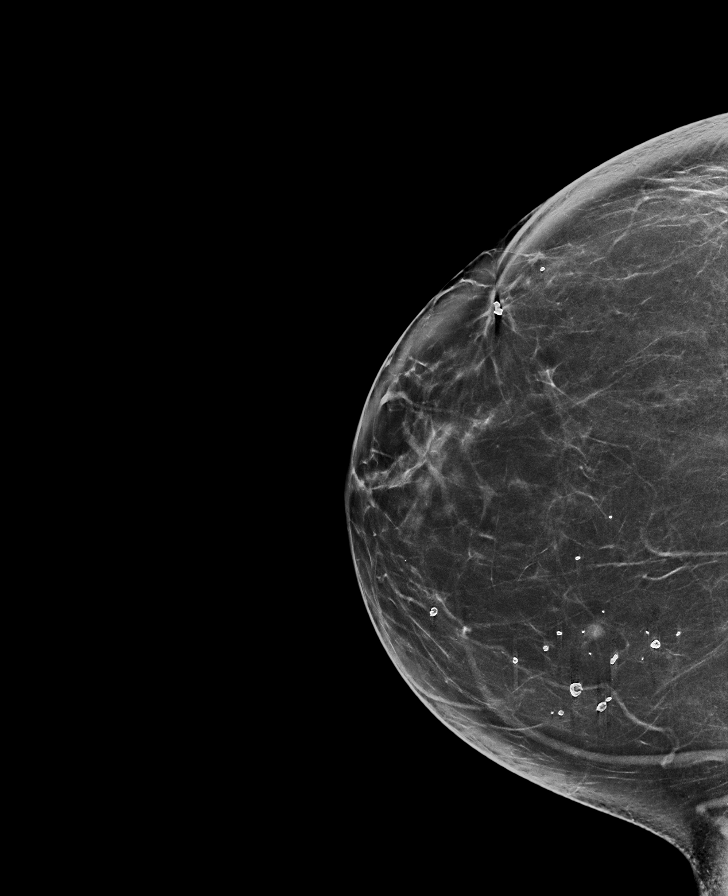

[R MLO tomo · tomo slice 47/92.0]
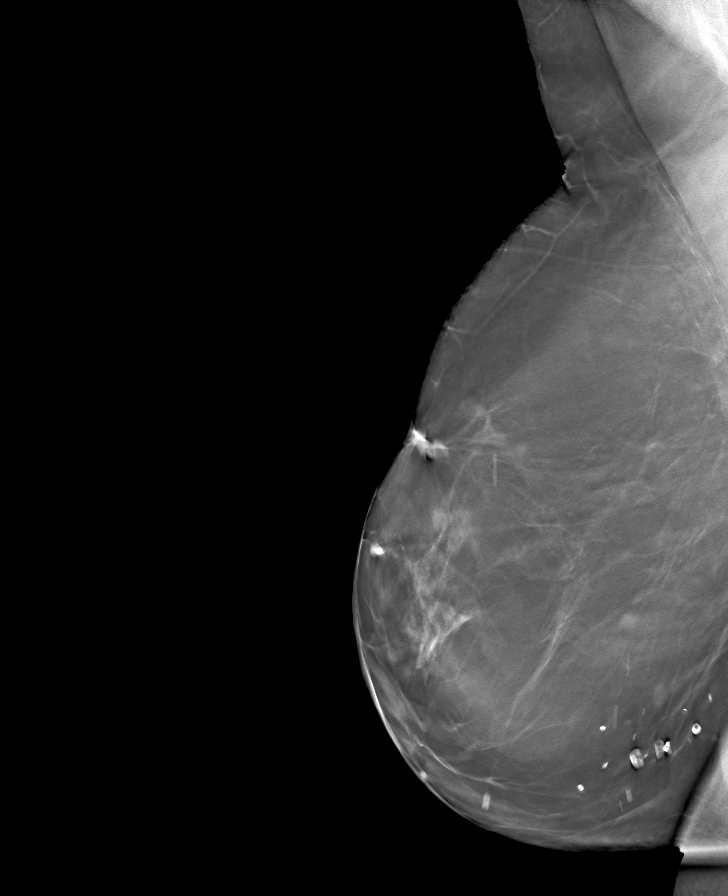

[L MLO tomo · tomo slice 48/95.0]
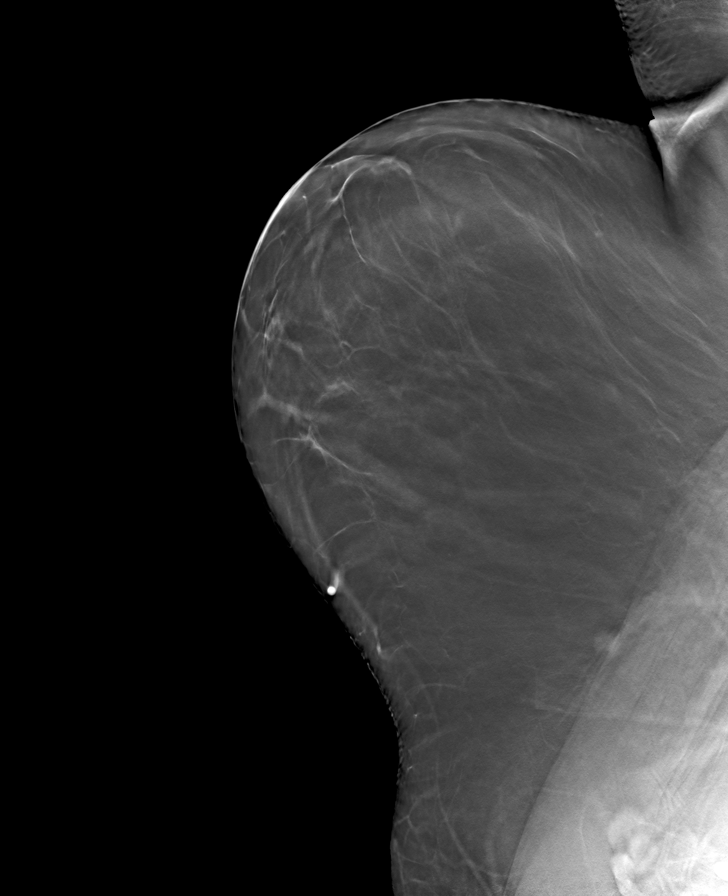

[L CC tomo · tomo slice 41/81.0]
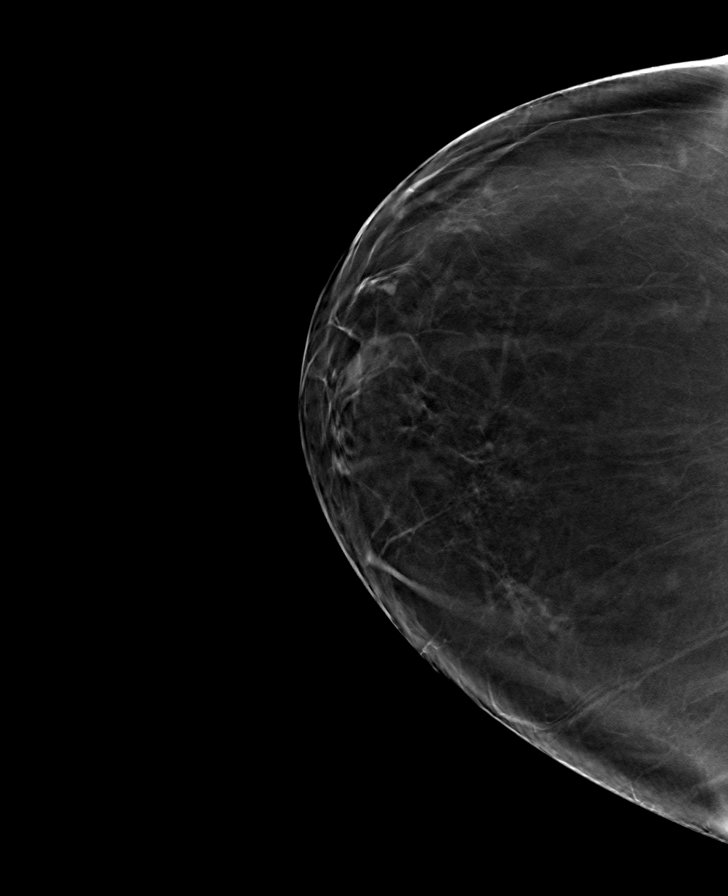

[R CC tomo · tomo slice 41/80.0]
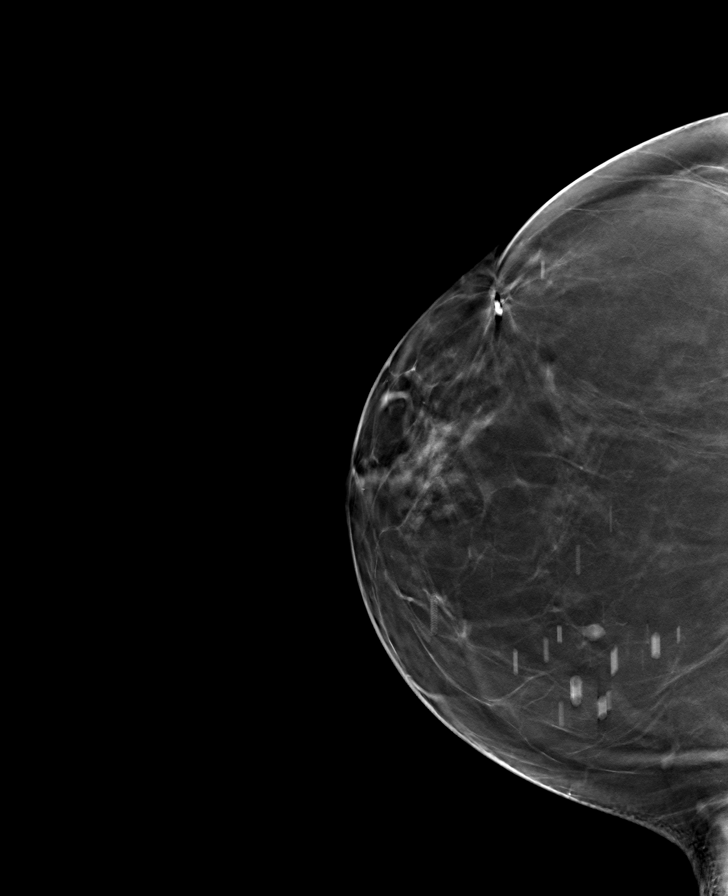

[8 of 24 positions shown; findings below may reference images not displayed]

ACR Breast Density Category b: There are scattered areas of
fibroglandular density.
FINDINGS: In the right breast a possible mass requires further evaluation.

In the left breast possible requires further evaluation.

Images were processed with CAD.
IMPRESSION: Further evaluation is suggested for a possible mass in the right
breast.

Further evaluation is suggested for a possible mass in the left
breast.

RECOMMENDATION:
Diagnostic mammogram and possibly ultrasound of both breasts.
(Code:[XF])

The patient will be contacted regarding the findings, and additional
imaging will be scheduled.

BI-RADS CATEGORY  0: Incomplete. Need additional imaging evaluation
and/or prior mammograms for comparison.

## 2020-02-17 ENCOUNTER — Other Ambulatory Visit: Payer: Self-pay | Admitting: Internal Medicine

## 2020-02-17 DIAGNOSIS — N6310 Unspecified lump in the right breast, unspecified quadrant: Secondary | ICD-10-CM

## 2020-02-17 DIAGNOSIS — N6320 Unspecified lump in the left breast, unspecified quadrant: Secondary | ICD-10-CM

## 2020-02-17 DIAGNOSIS — R928 Other abnormal and inconclusive findings on diagnostic imaging of breast: Secondary | ICD-10-CM

## 2020-02-18 DIAGNOSIS — Z01419 Encounter for gynecological examination (general) (routine) without abnormal findings: Secondary | ICD-10-CM | POA: Diagnosis not present

## 2020-02-22 ENCOUNTER — Ambulatory Visit
Admission: RE | Admit: 2020-02-22 | Discharge: 2020-02-22 | Disposition: A | Discharge status: Home / Self Care | Payer: 59 | Source: Ambulatory Visit | Attending: Internal Medicine | Admitting: Internal Medicine

## 2020-02-22 DIAGNOSIS — N6320 Unspecified lump in the left breast, unspecified quadrant: Secondary | ICD-10-CM | POA: Insufficient documentation

## 2020-02-22 DIAGNOSIS — N6310 Unspecified lump in the right breast, unspecified quadrant: Secondary | ICD-10-CM | POA: Insufficient documentation

## 2020-02-22 DIAGNOSIS — R928 Other abnormal and inconclusive findings on diagnostic imaging of breast: Secondary | ICD-10-CM | POA: Diagnosis not present

## 2020-02-22 DIAGNOSIS — N6321 Unspecified lump in the left breast, upper outer quadrant: Secondary | ICD-10-CM | POA: Diagnosis not present

## 2020-02-22 DIAGNOSIS — N6323 Unspecified lump in the left breast, lower outer quadrant: Secondary | ICD-10-CM | POA: Diagnosis not present

## 2020-02-22 DIAGNOSIS — N6312 Unspecified lump in the right breast, upper inner quadrant: Secondary | ICD-10-CM | POA: Diagnosis not present

## 2020-02-22 IMAGING — US US BREAST*R* LIMITED INC AXILLA
1 series · 9 of 9 positions shown · non-contrast
Comparison: Previous exam(s).

CLINICAL DATA: 58-year-old female with history of right breast
from screening for bilateral breast masses.

EXAM:
DIGITAL DIAGNOSTIC BILATERAL MAMMOGRAM WITH TOMO
ULTRASOUND BILATERAL BREAST

[Series 1: us breast*right* limited inc axilla · 0.06mm/px · 9 of 9 slices shown]
[im 1/9]
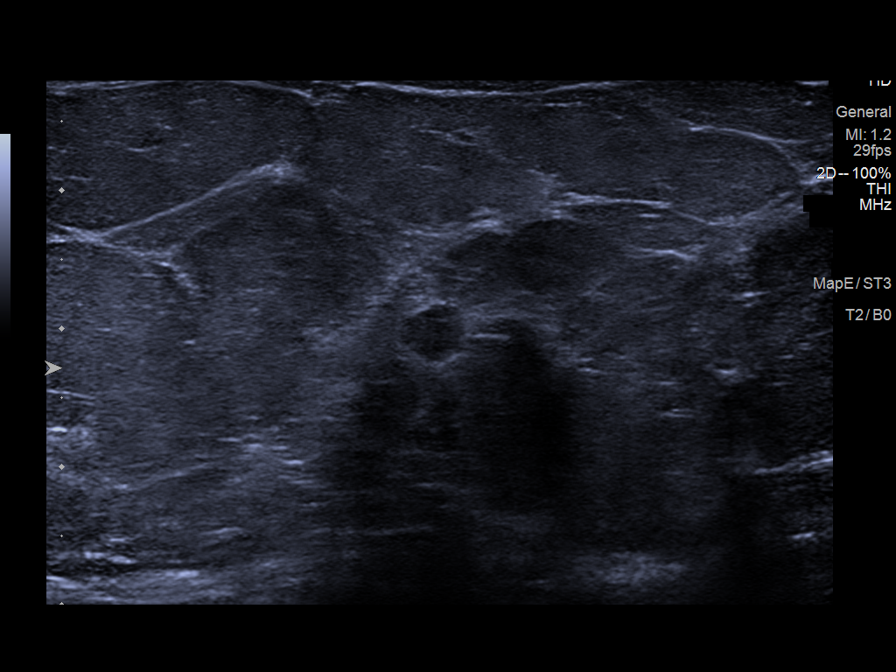
[im 2/9]
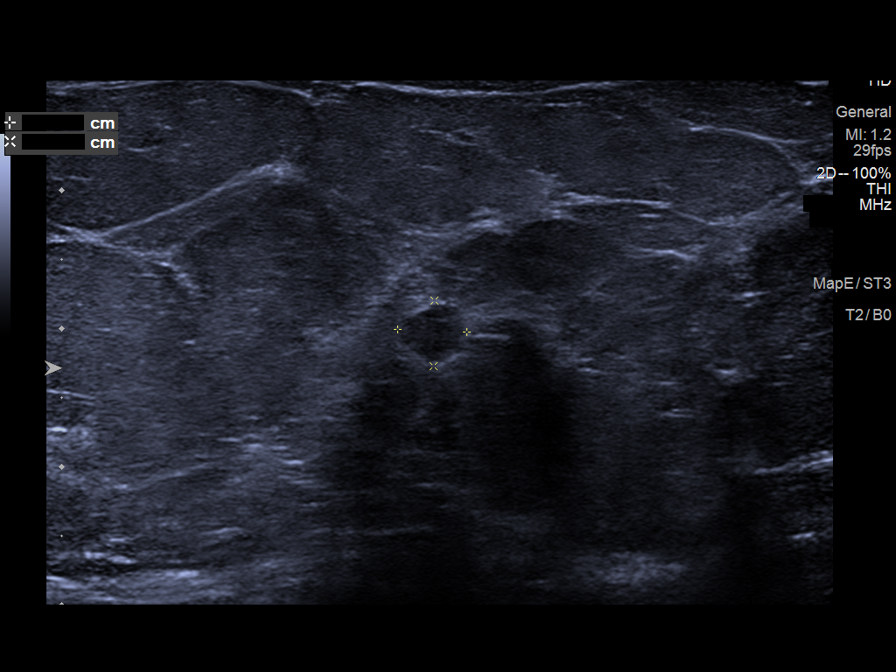
[im 3/9]
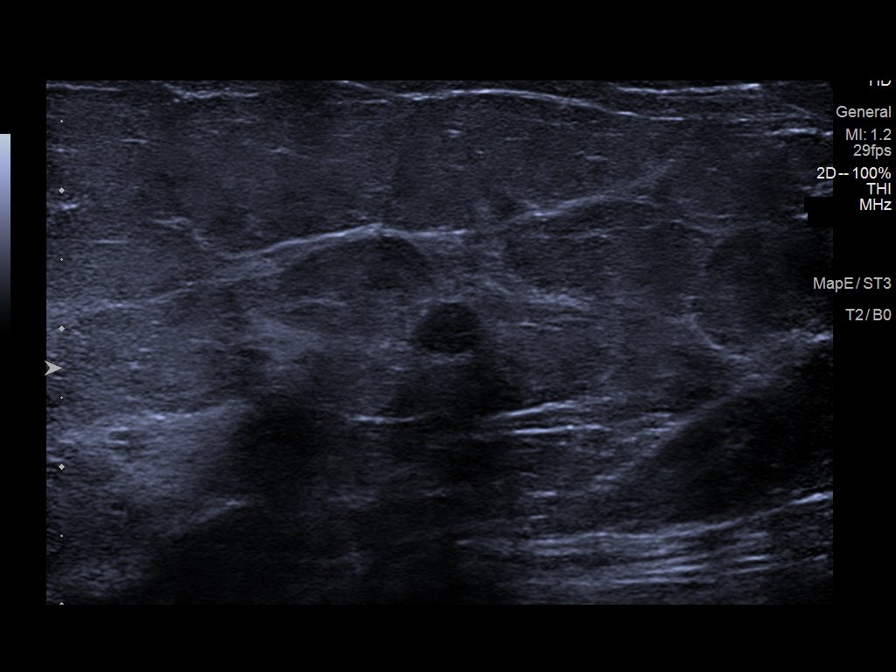
[im 4/9]
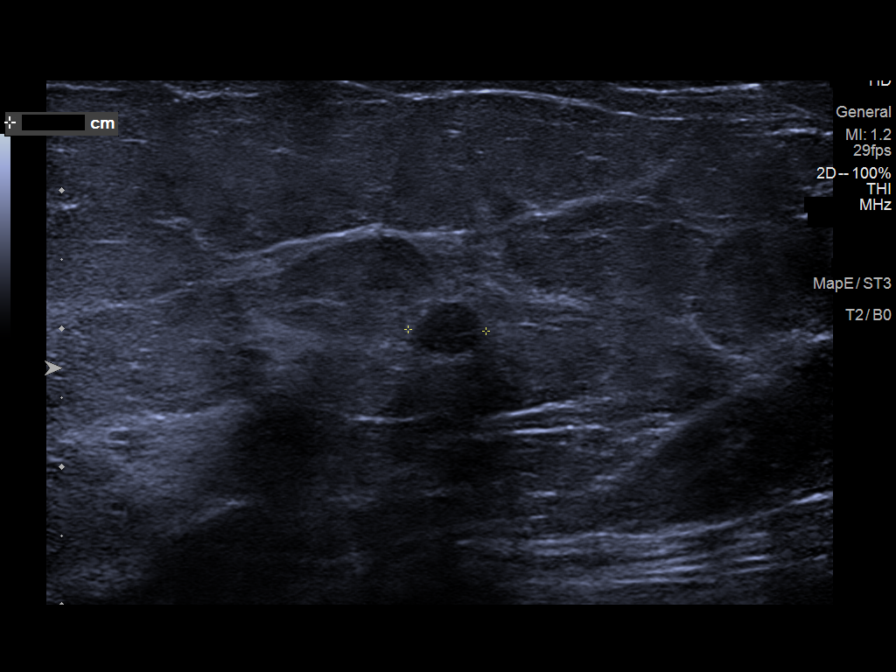
[im 5/9]
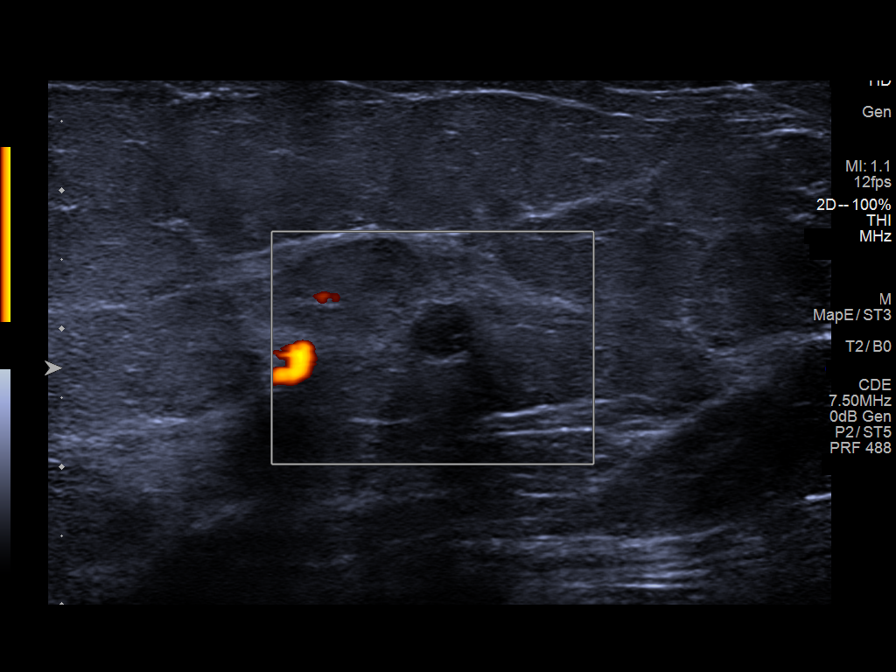
[im 6/9]
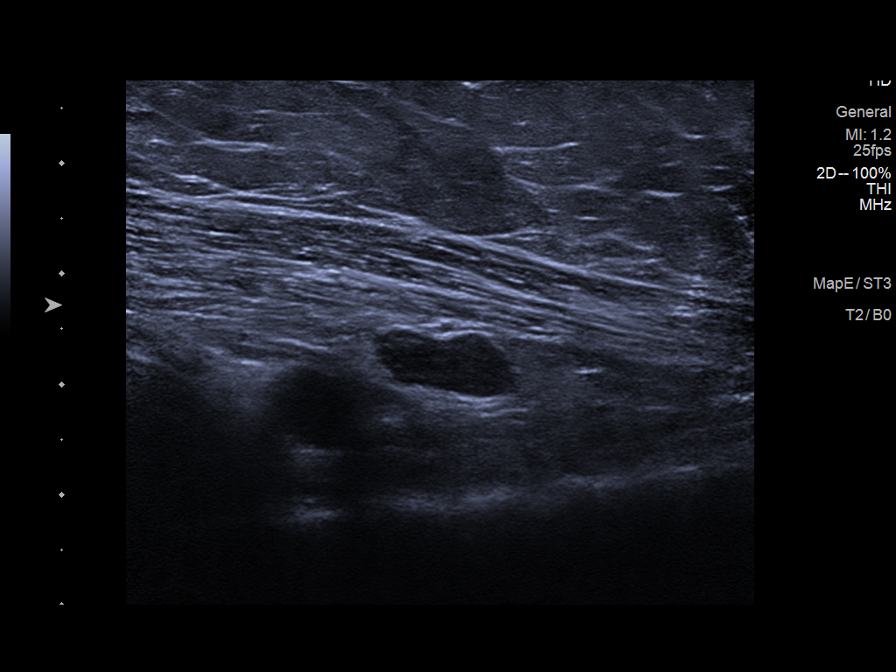
[im 7/9]
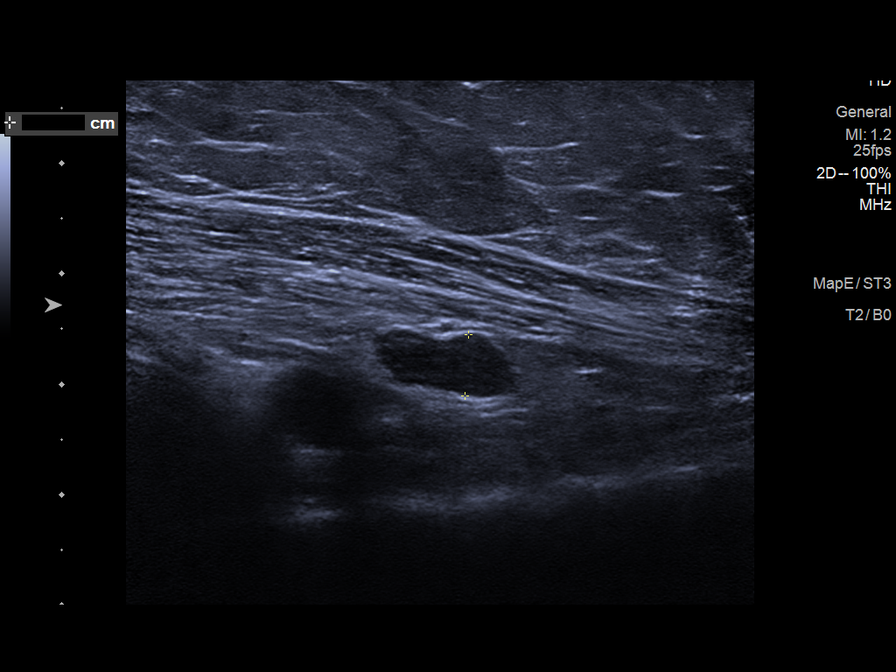
[im 8/9]
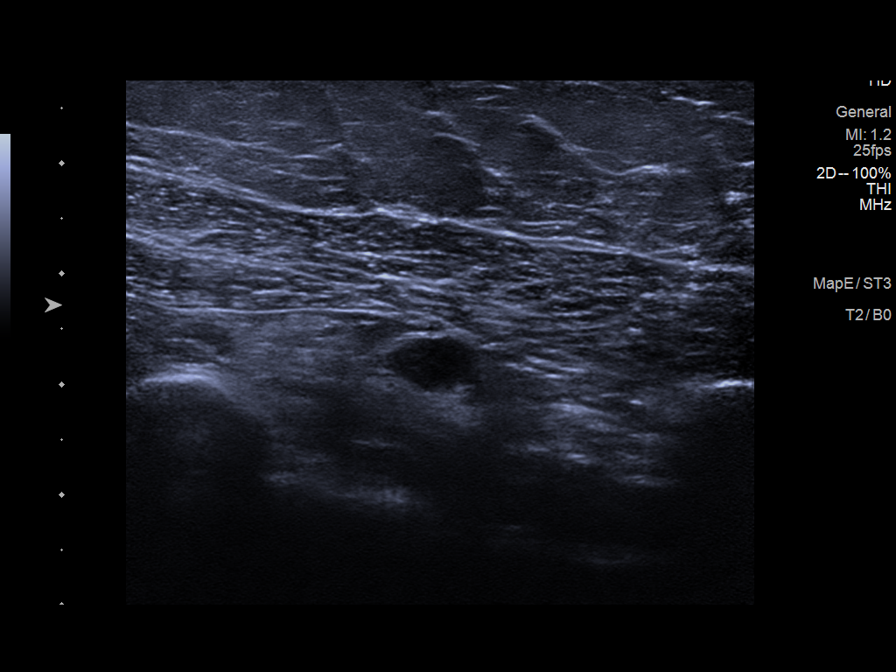
[im 9/9]
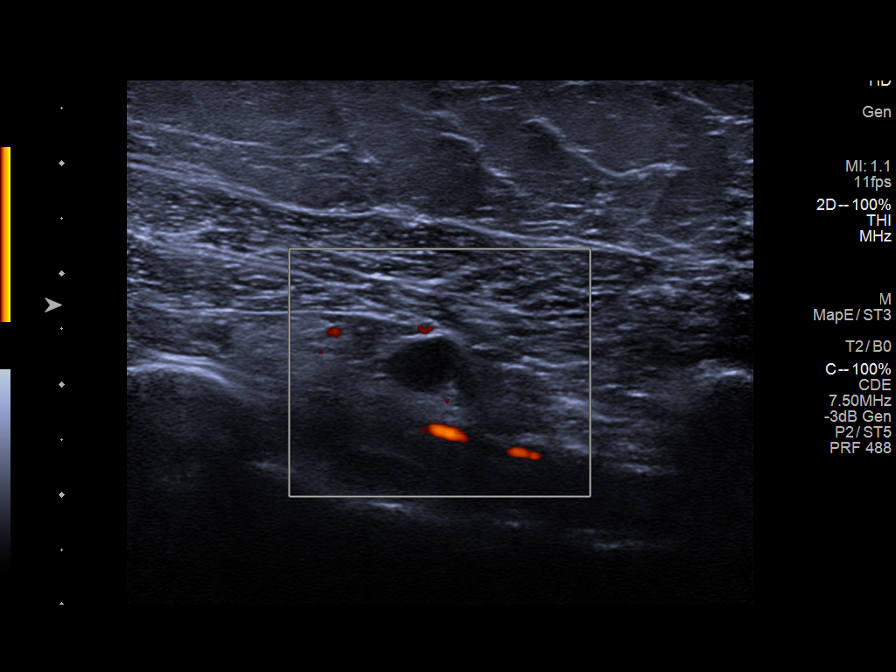

[9 of 9 positions shown; findings below may reference images not displayed]

ACR Breast Density Category b: There are scattered areas of
fibroglandular density.
FINDINGS: Mammogram:

Right breast: Spot compression tomosynthesis views demonstrate
persistence of a round mass in the upper inner right breast
measuring approximately 0.6 cm.

Left breast: Spot compression and full field mL views demonstrate
persistence of a spiculated mass measuring 0.4 cm in the outer left
breast.

Ultrasound:

Targeted ultrasound is performed in the right breast at 2 o'clock 5
cm from the nipple demonstrating a round hypoechoic mass with
indistinct margins measuring 0.6 x 0.5 x 0.5 cm. No internal blood
flow identified.

Targeted ultrasound is performed in in the right axilla
demonstrating a lymph node with thickened cortex measuring 0.6 cm.

Targeted ultrasound is performed in the left breast at 3 o'clock 6
cm from the nipple demonstrating an irregular hypoechoic mass with
indistinct margins measuring 0.5 x 0.5 x 0.4 cm.

Targeted ultrasound of the left axilla demonstrates normal-appearing
lymph nodes.
IMPRESSION: 1. Right breast mass at 2 o'clock measuring 0.6 cm is indeterminate.

2.  Abnormal right axillary lymph node with cortical thickening.

3.  Left breast mass at 3 o'clock measuring 0.5 cm is suspicious.

RECOMMENDATION:
Ultrasound-guided core needle biopsy x3 of the right breast mass at
2 o'clock, left breast mass at 3 o'clock and of the right axillary
lymph node with cortical thickening.

This will be scheduled at the patient's earliest convenience.

I have discussed the findings and recommendations with the patient.
If applicable, a reminder letter will be sent to the patient
regarding the next appointment.

BI-RADS CATEGORY  4: Suspicious.

## 2020-02-22 IMAGING — US US BREAST*L* LIMITED INC AXILLA
1 series · 6 of 6 positions shown · non-contrast
Comparison: Previous exam(s).

CLINICAL DATA: 58-year-old female with history of right breast
from screening for bilateral breast masses.

EXAM:
DIGITAL DIAGNOSTIC BILATERAL MAMMOGRAM WITH TOMO
ULTRASOUND BILATERAL BREAST

[Series 1: us breast*left* limited inc axilla · 0.07mm/px · 6 of 6 slices shown]
[im 1/6]
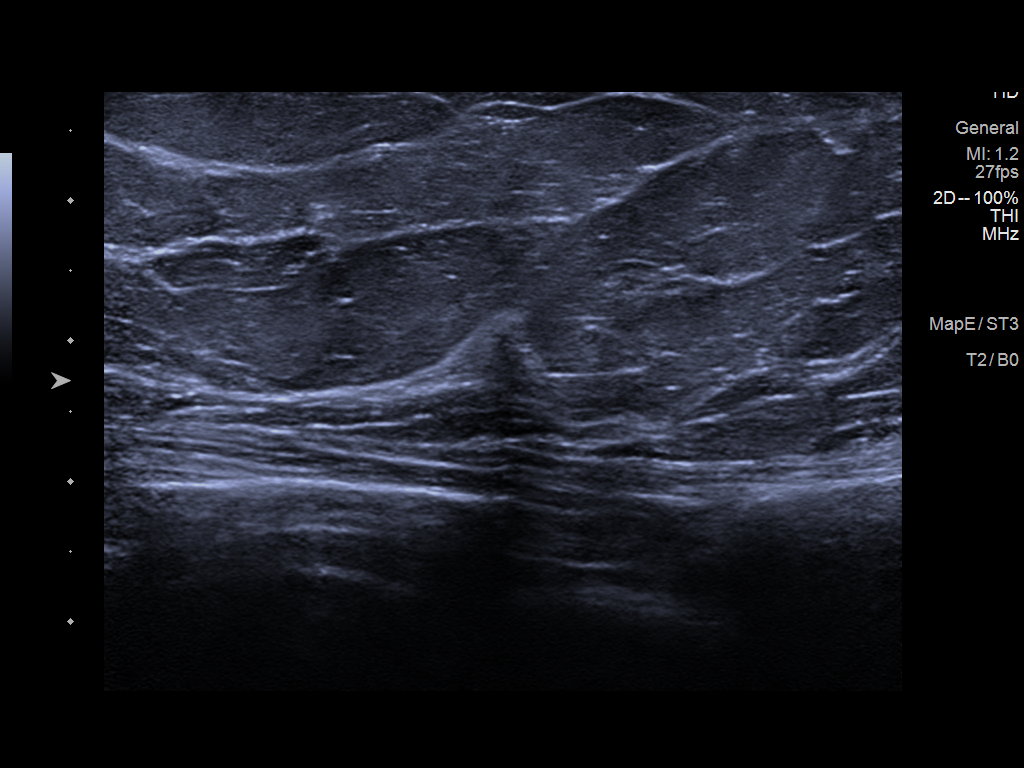
[im 2/6]
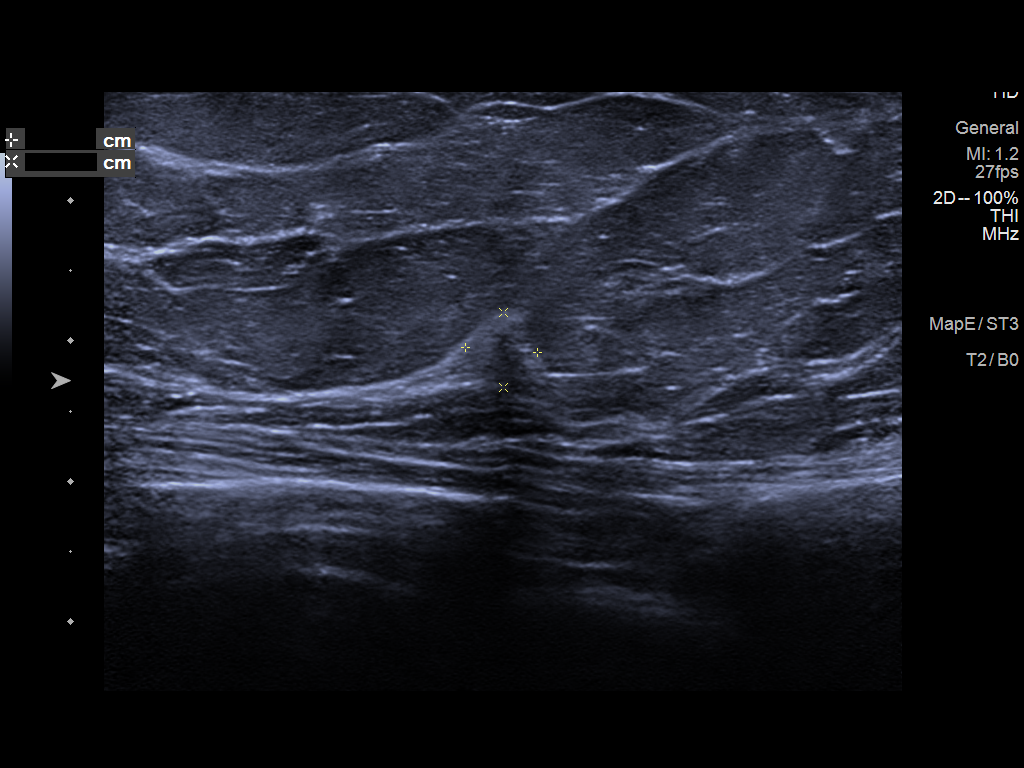
[im 3/6]
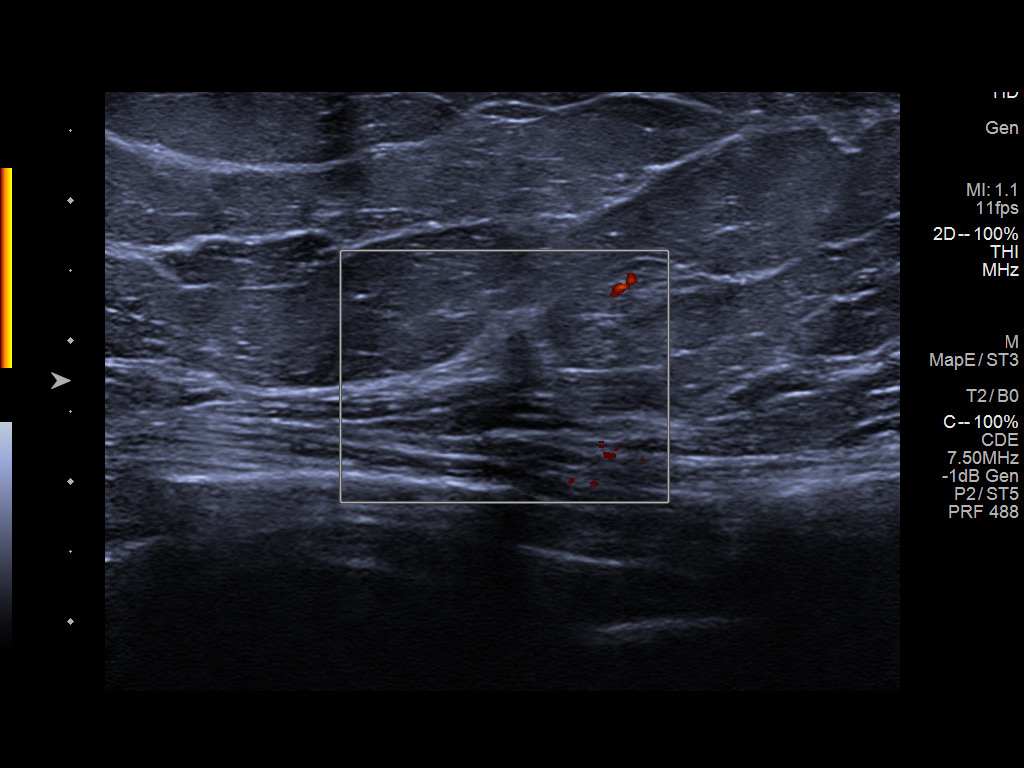
[im 4/6]
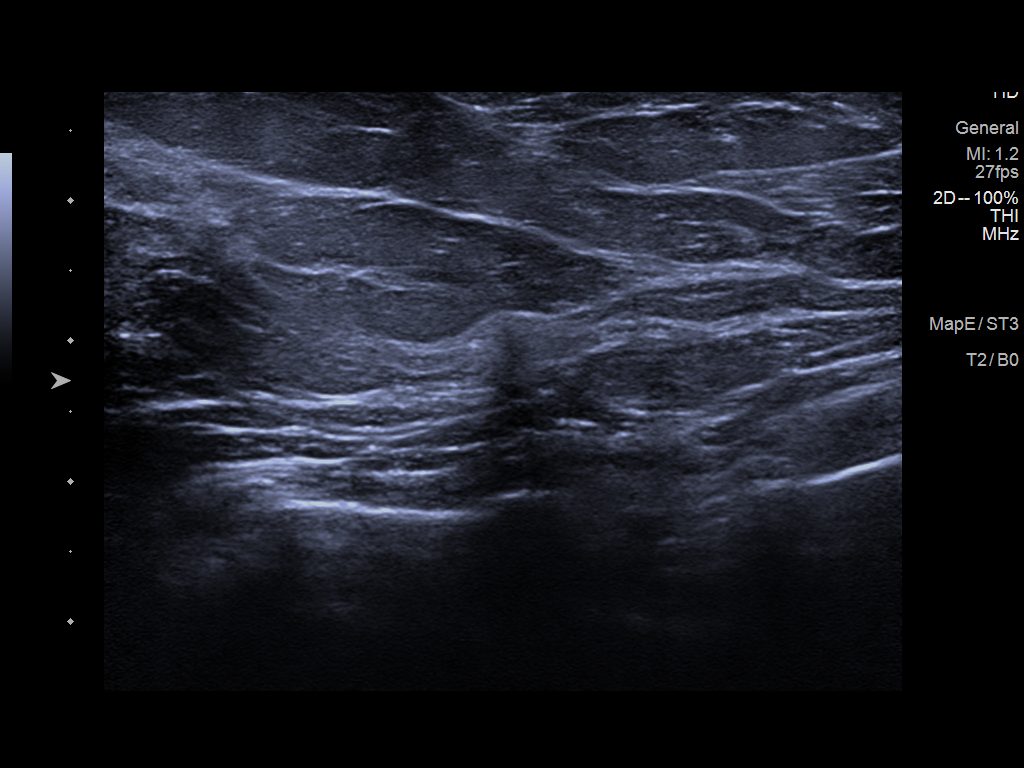
[im 5/6]
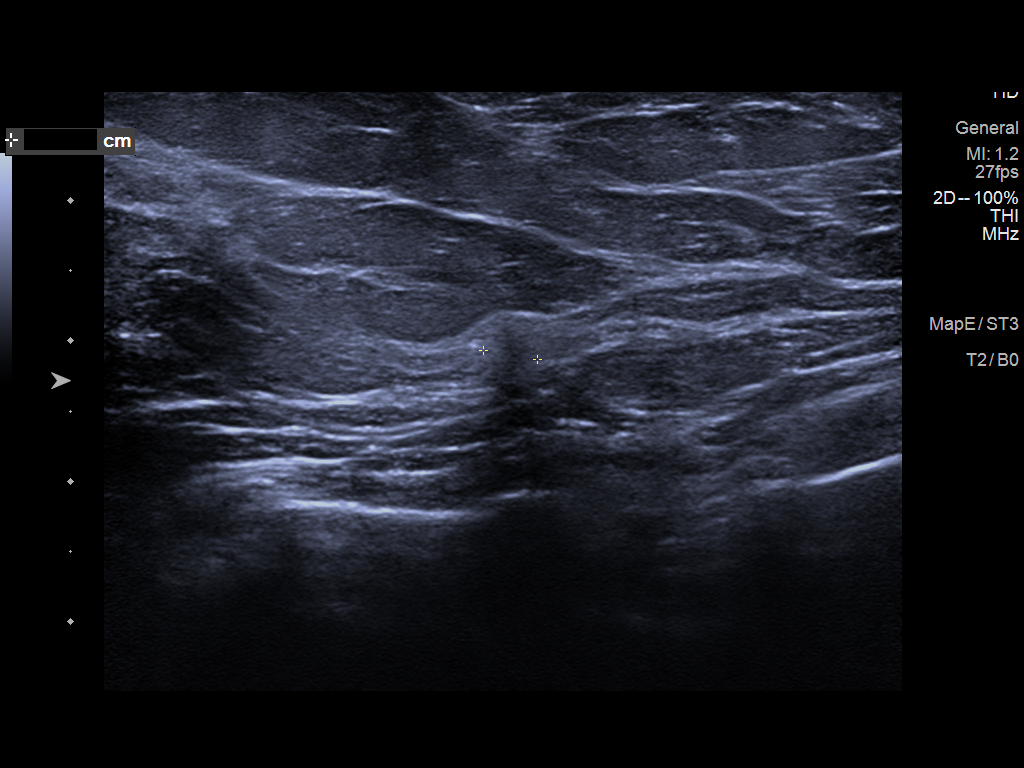
[im 6/6]
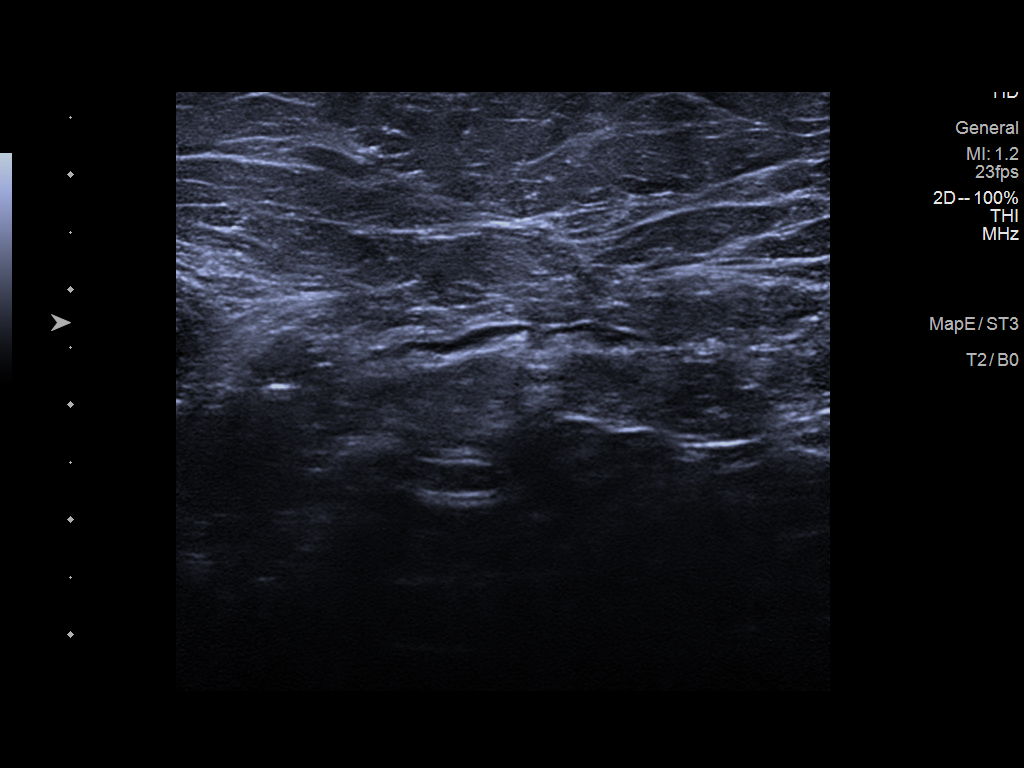

[6 of 6 positions shown; findings below may reference images not displayed]

ACR Breast Density Category b: There are scattered areas of
fibroglandular density.
FINDINGS: Mammogram:

Right breast: Spot compression tomosynthesis views demonstrate
persistence of a round mass in the upper inner right breast
measuring approximately 0.6 cm.

Left breast: Spot compression and full field mL views demonstrate
persistence of a spiculated mass measuring 0.4 cm in the outer left
breast.

Ultrasound:

Targeted ultrasound is performed in the right breast at 2 o'clock 5
cm from the nipple demonstrating a round hypoechoic mass with
indistinct margins measuring 0.6 x 0.5 x 0.5 cm. No internal blood
flow identified.

Targeted ultrasound is performed in in the right axilla
demonstrating a lymph node with thickened cortex measuring 0.6 cm.

Targeted ultrasound is performed in the left breast at 3 o'clock 6
cm from the nipple demonstrating an irregular hypoechoic mass with
indistinct margins measuring 0.5 x 0.5 x 0.4 cm.

Targeted ultrasound of the left axilla demonstrates normal-appearing
lymph nodes.
IMPRESSION: 1. Right breast mass at 2 o'clock measuring 0.6 cm is indeterminate.

2.  Abnormal right axillary lymph node with cortical thickening.

3.  Left breast mass at 3 o'clock measuring 0.5 cm is suspicious.

RECOMMENDATION:
Ultrasound-guided core needle biopsy x3 of the right breast mass at
2 o'clock, left breast mass at 3 o'clock and of the right axillary
lymph node with cortical thickening.

This will be scheduled at the patient's earliest convenience.

I have discussed the findings and recommendations with the patient.
If applicable, a reminder letter will be sent to the patient
regarding the next appointment.

BI-RADS CATEGORY  4: Suspicious.

## 2020-02-22 IMAGING — MG DIGITAL DIAGNOSTIC BILAT W/ TOMO W/ CAD
6 of 10 series · 6 of 30 positions shown · non-contrast
Comparison: Previous exam(s).

CLINICAL DATA: 58-year-old female with history of right breast
from screening for bilateral breast masses.

EXAM:
DIGITAL DIAGNOSTIC BILATERAL MAMMOGRAM WITH TOMO
ULTRASOUND BILATERAL BREAST

[R CC synth-2D]
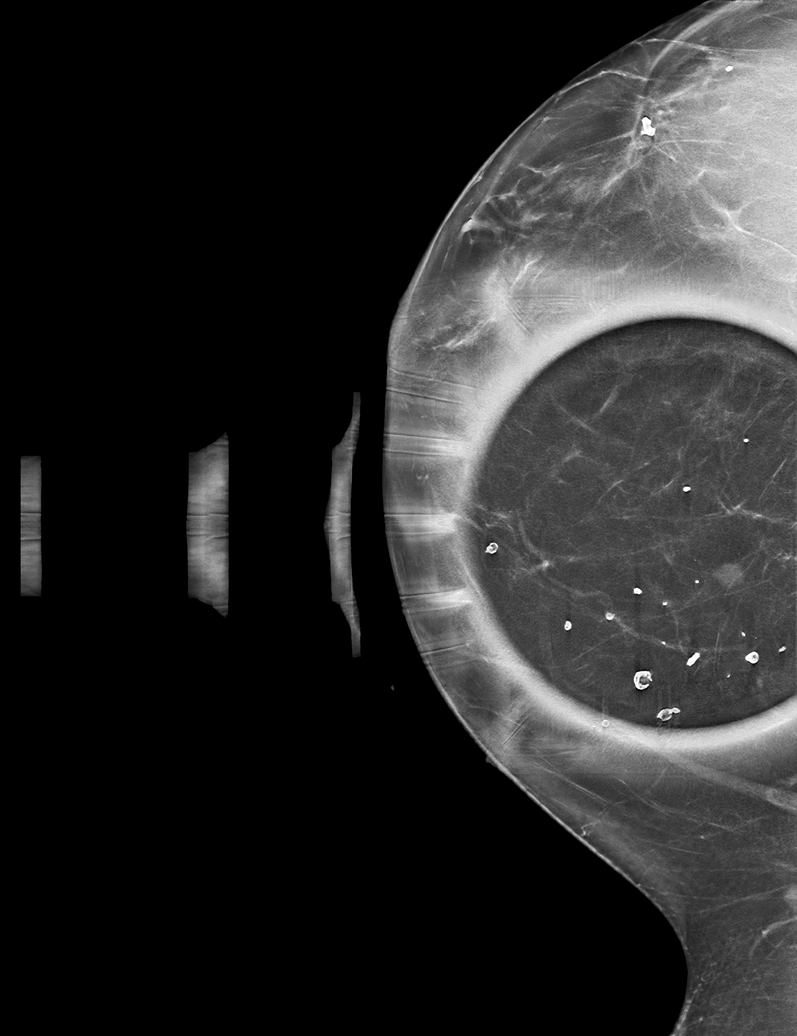

[R MLO synth-2D]
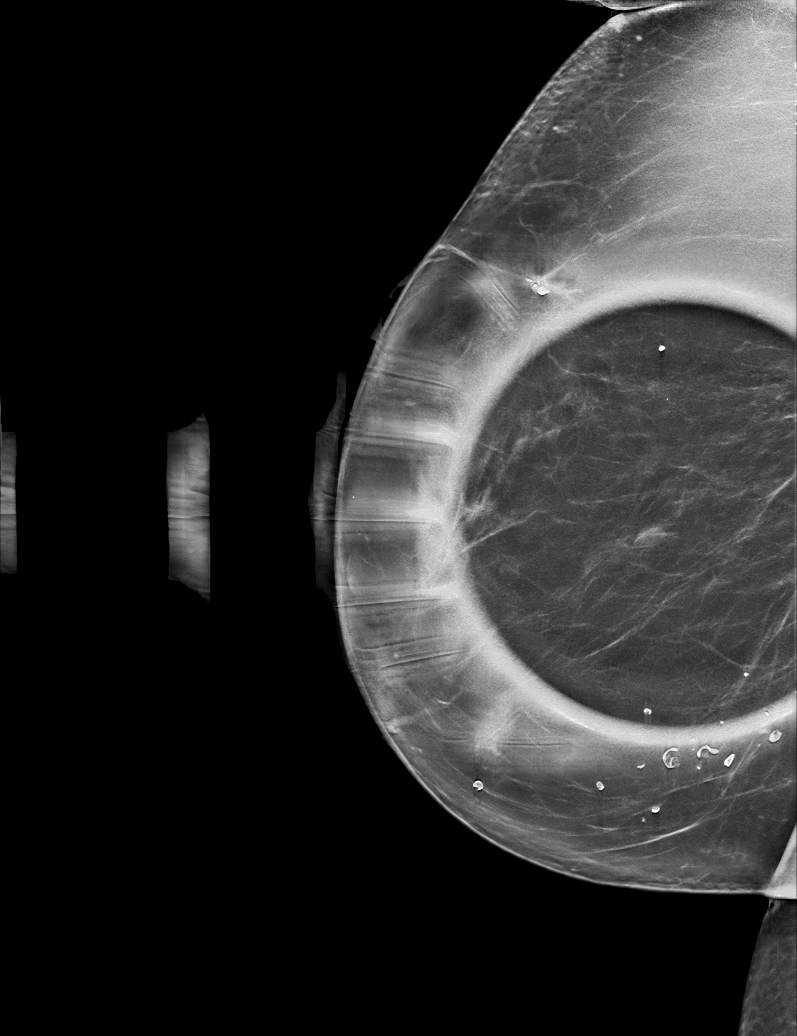

[L CC synth-2D]
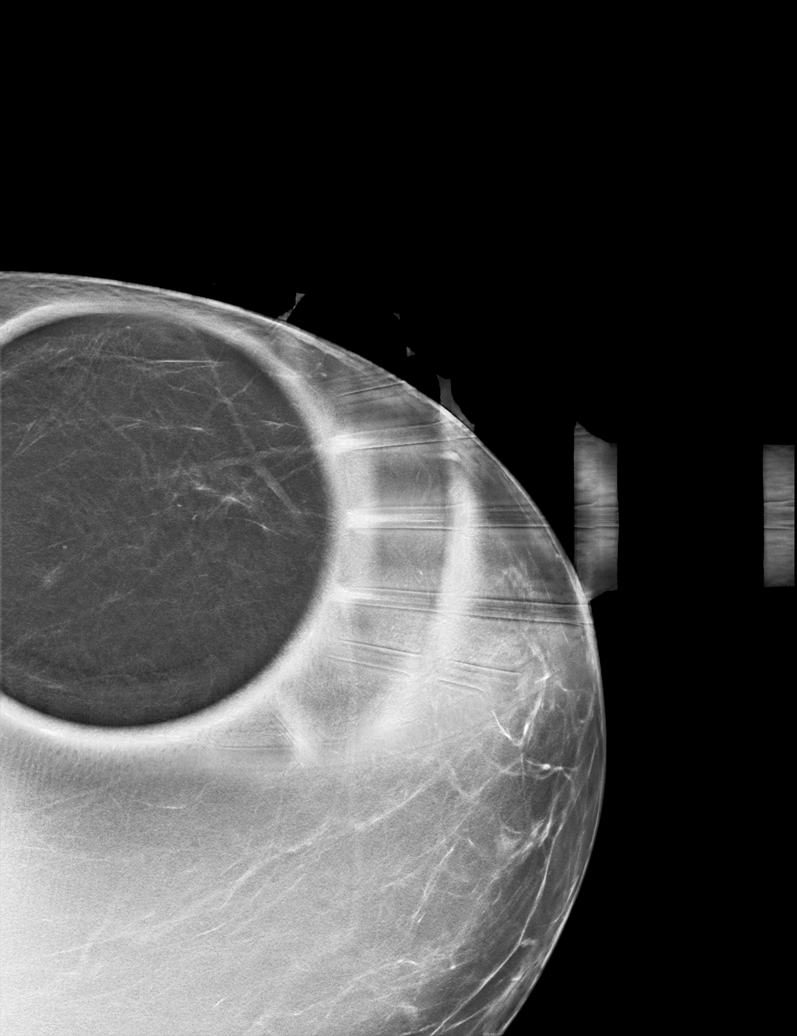

[L ML synth-2D]
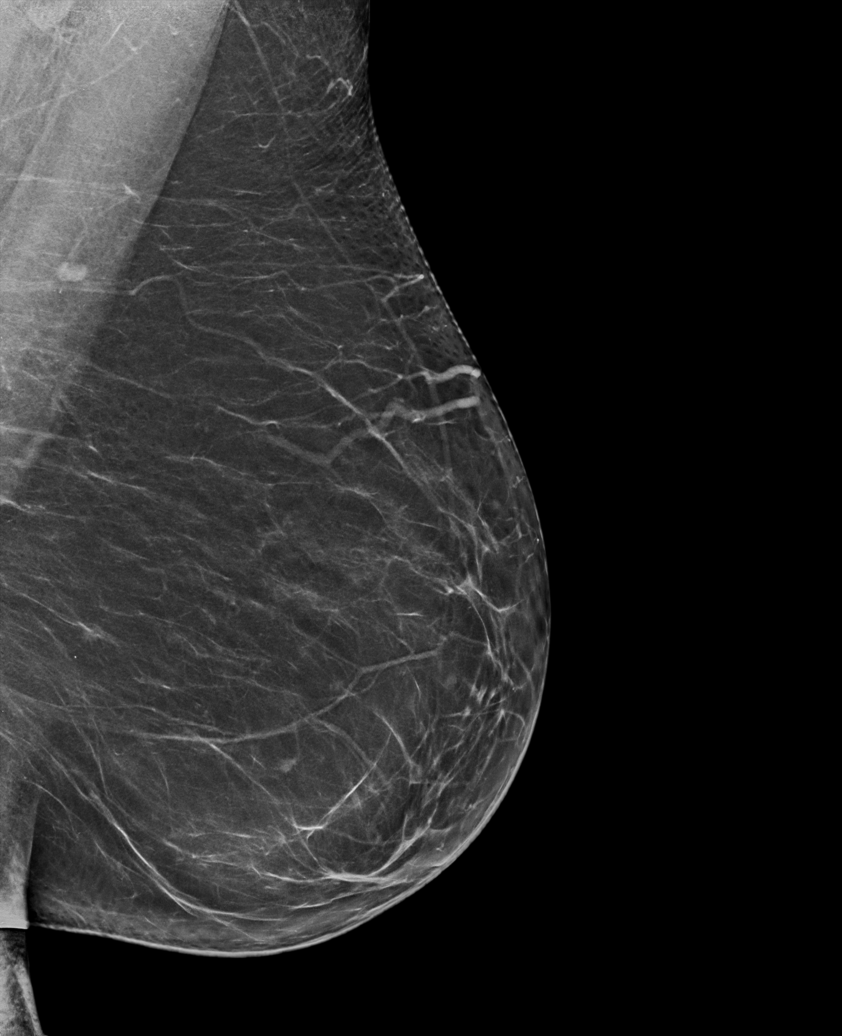

[L MLO synth-2D]
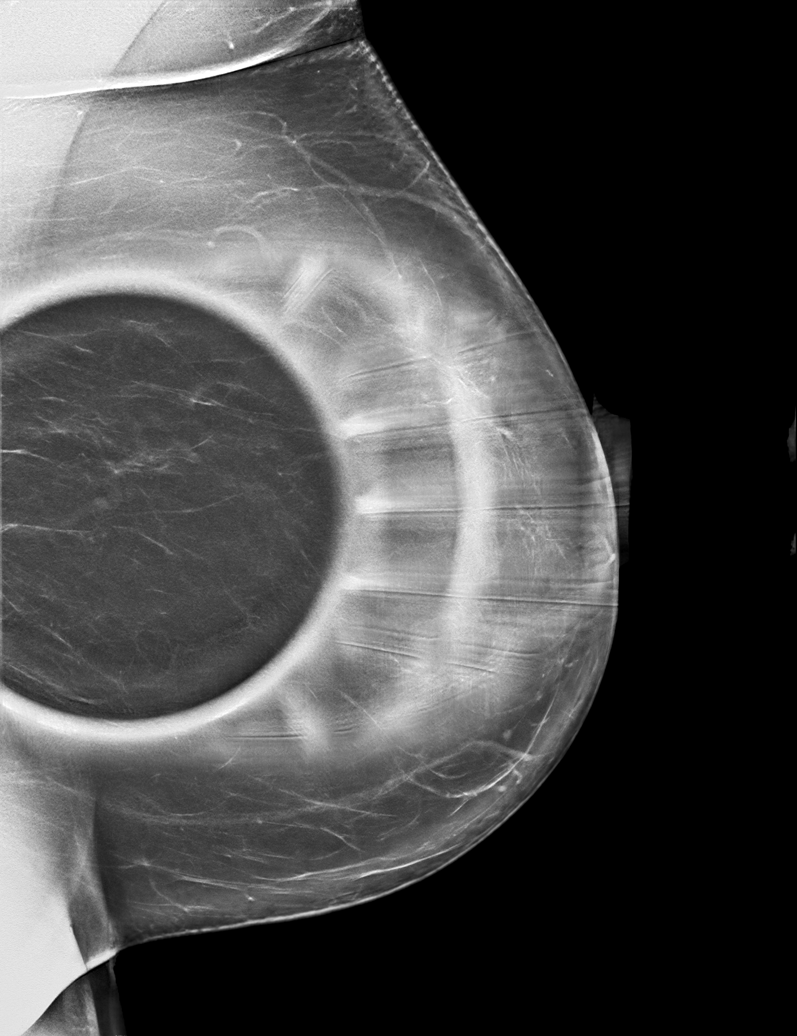

[R CC tomo · tomo slice 33/66.0]
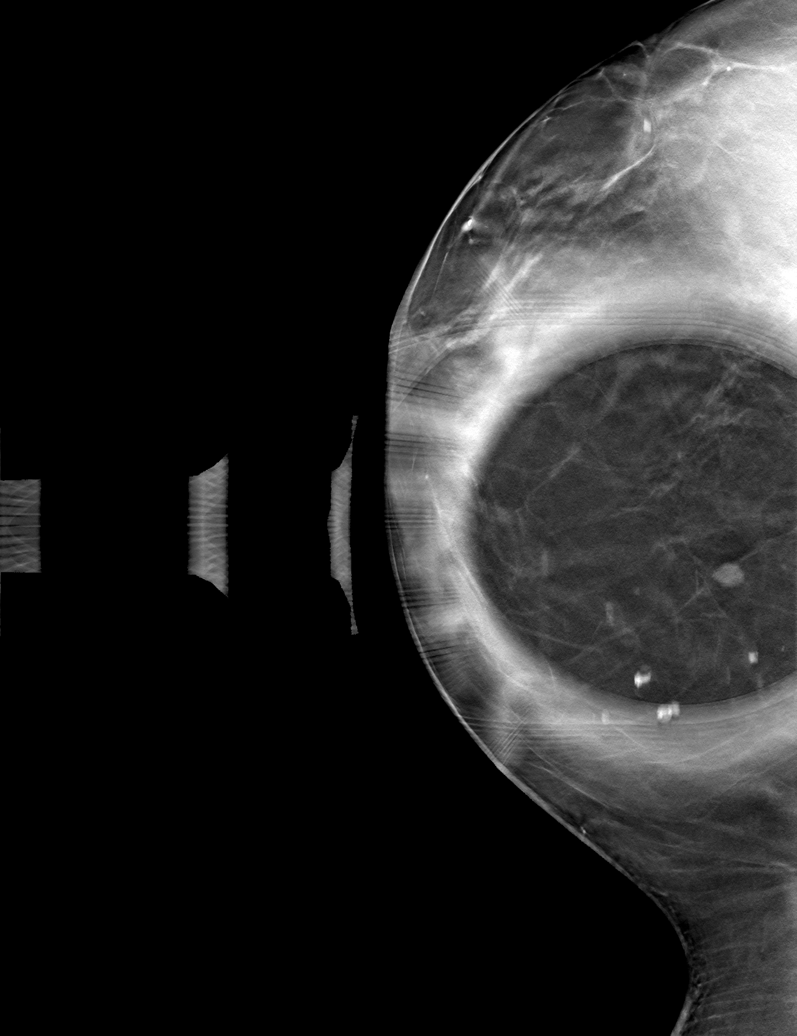

[6 of 30 positions shown; findings below may reference images not displayed]

ACR Breast Density Category b: There are scattered areas of
fibroglandular density.
FINDINGS: Mammogram:

Right breast: Spot compression tomosynthesis views demonstrate
persistence of a round mass in the upper inner right breast
measuring approximately 0.6 cm.

Left breast: Spot compression and full field mL views demonstrate
persistence of a spiculated mass measuring 0.4 cm in the outer left
breast.

Ultrasound:

Targeted ultrasound is performed in the right breast at 2 o'clock 5
cm from the nipple demonstrating a round hypoechoic mass with
indistinct margins measuring 0.6 x 0.5 x 0.5 cm. No internal blood
flow identified.

Targeted ultrasound is performed in in the right axilla
demonstrating a lymph node with thickened cortex measuring 0.6 cm.

Targeted ultrasound is performed in the left breast at 3 o'clock 6
cm from the nipple demonstrating an irregular hypoechoic mass with
indistinct margins measuring 0.5 x 0.5 x 0.4 cm.

Targeted ultrasound of the left axilla demonstrates normal-appearing
lymph nodes.
IMPRESSION: 1. Right breast mass at 2 o'clock measuring 0.6 cm is indeterminate.

2.  Abnormal right axillary lymph node with cortical thickening.

3.  Left breast mass at 3 o'clock measuring 0.5 cm is suspicious.

RECOMMENDATION:
Ultrasound-guided core needle biopsy x3 of the right breast mass at
2 o'clock, left breast mass at 3 o'clock and of the right axillary
lymph node with cortical thickening.

This will be scheduled at the patient's earliest convenience.

I have discussed the findings and recommendations with the patient.
If applicable, a reminder letter will be sent to the patient
regarding the next appointment.

BI-RADS CATEGORY  4: Suspicious.

## 2020-02-24 ENCOUNTER — Other Ambulatory Visit: Payer: Self-pay | Admitting: Internal Medicine

## 2020-02-24 DIAGNOSIS — N6320 Unspecified lump in the left breast, unspecified quadrant: Secondary | ICD-10-CM

## 2020-02-24 DIAGNOSIS — R928 Other abnormal and inconclusive findings on diagnostic imaging of breast: Secondary | ICD-10-CM

## 2020-02-24 DIAGNOSIS — N6310 Unspecified lump in the right breast, unspecified quadrant: Secondary | ICD-10-CM

## 2020-03-01 ENCOUNTER — Ambulatory Visit
Admission: RE | Admit: 2020-03-01 | Discharge: 2020-03-01 | Disposition: A | Discharge status: Home / Self Care | Payer: 59 | Source: Ambulatory Visit | Attending: Internal Medicine | Admitting: Internal Medicine

## 2020-03-01 DIAGNOSIS — R928 Other abnormal and inconclusive findings on diagnostic imaging of breast: Secondary | ICD-10-CM

## 2020-03-01 DIAGNOSIS — C50819 Malignant neoplasm of overlapping sites of unspecified female breast: Secondary | ICD-10-CM | POA: Diagnosis not present

## 2020-03-01 DIAGNOSIS — N6312 Unspecified lump in the right breast, upper inner quadrant: Secondary | ICD-10-CM | POA: Diagnosis not present

## 2020-03-01 DIAGNOSIS — N6321 Unspecified lump in the left breast, upper outer quadrant: Secondary | ICD-10-CM | POA: Diagnosis not present

## 2020-03-01 DIAGNOSIS — N6320 Unspecified lump in the left breast, unspecified quadrant: Secondary | ICD-10-CM | POA: Diagnosis not present

## 2020-03-01 DIAGNOSIS — N6310 Unspecified lump in the right breast, unspecified quadrant: Secondary | ICD-10-CM | POA: Diagnosis not present

## 2020-03-01 DIAGNOSIS — Z7689 Persons encountering health services in other specified circumstances: Secondary | ICD-10-CM | POA: Diagnosis not present

## 2020-03-01 DIAGNOSIS — R590 Localized enlarged lymph nodes: Secondary | ICD-10-CM | POA: Diagnosis not present

## 2020-03-01 HISTORY — PX: BREAST BIOPSY: SHX20

## 2020-03-01 IMAGING — MG MM BREAST LOCALIZATION CLIP
4 series · 4 of 12 positions shown · non-contrast
Comparison: Previous exam(s).

CLINICAL DATA: Patient is post ultrasound-guided core needle biopsy
of a 5 mm indeterminate mass over the 3 o'clock position of the left
breast as well as ultrasound-guided core needle biopsy of a 5 mm
right breast mass at the 2 o'clock position in addition to
ultrasound core biopsy of a anteromedial right axillary/subpectoral
lymph node. No clip was placed in the lymph node.

EXAM:
DIAGNOSTIC bilateral MAMMOGRAM POST ultrasound BIOPSY

[L CC synth-2D]
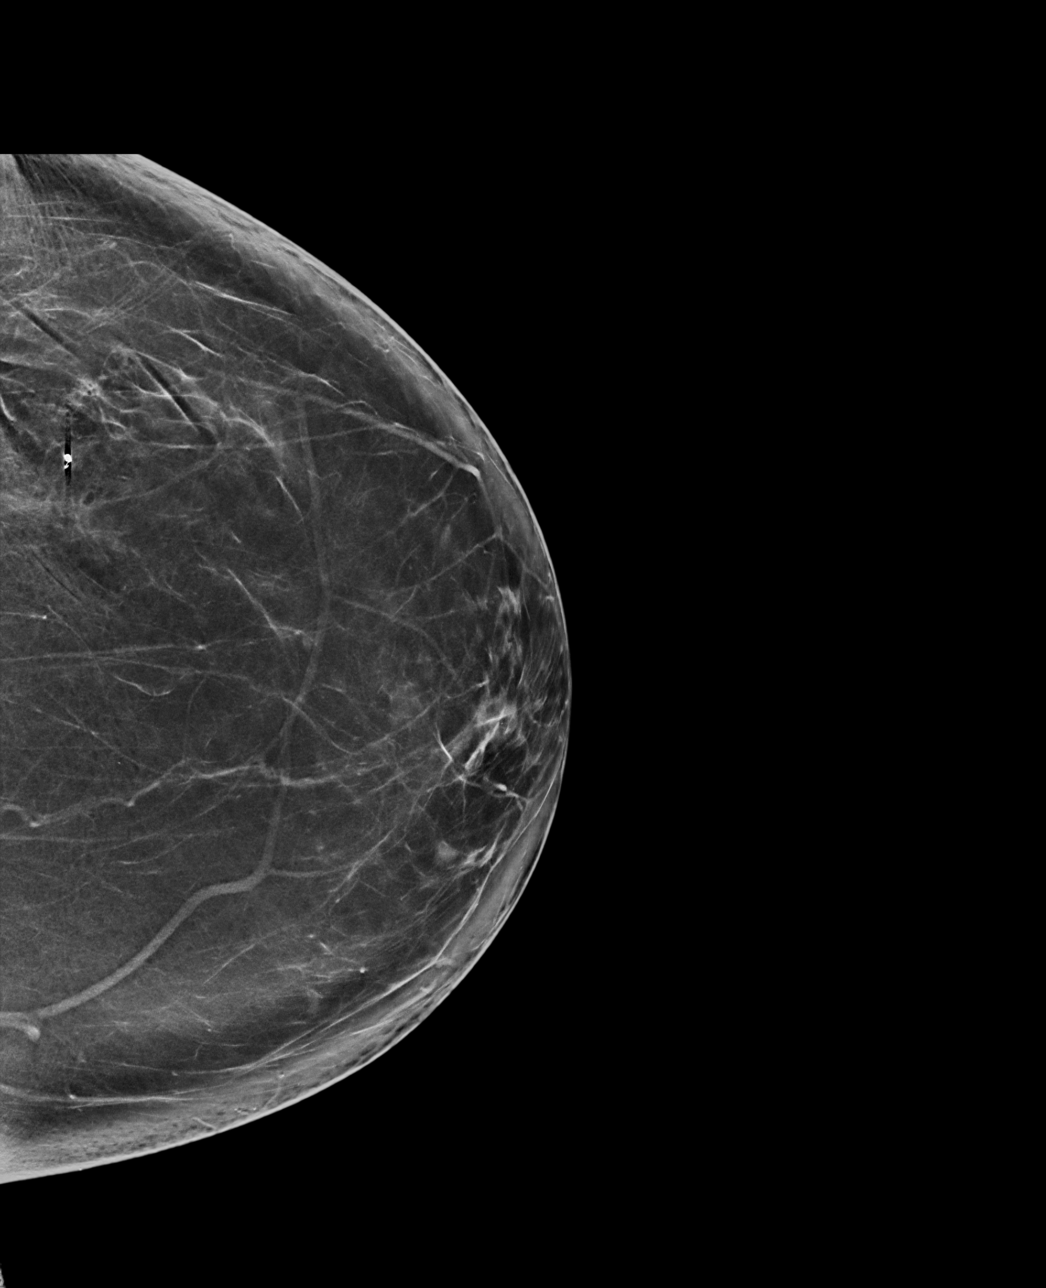

[L ML synth-2D]
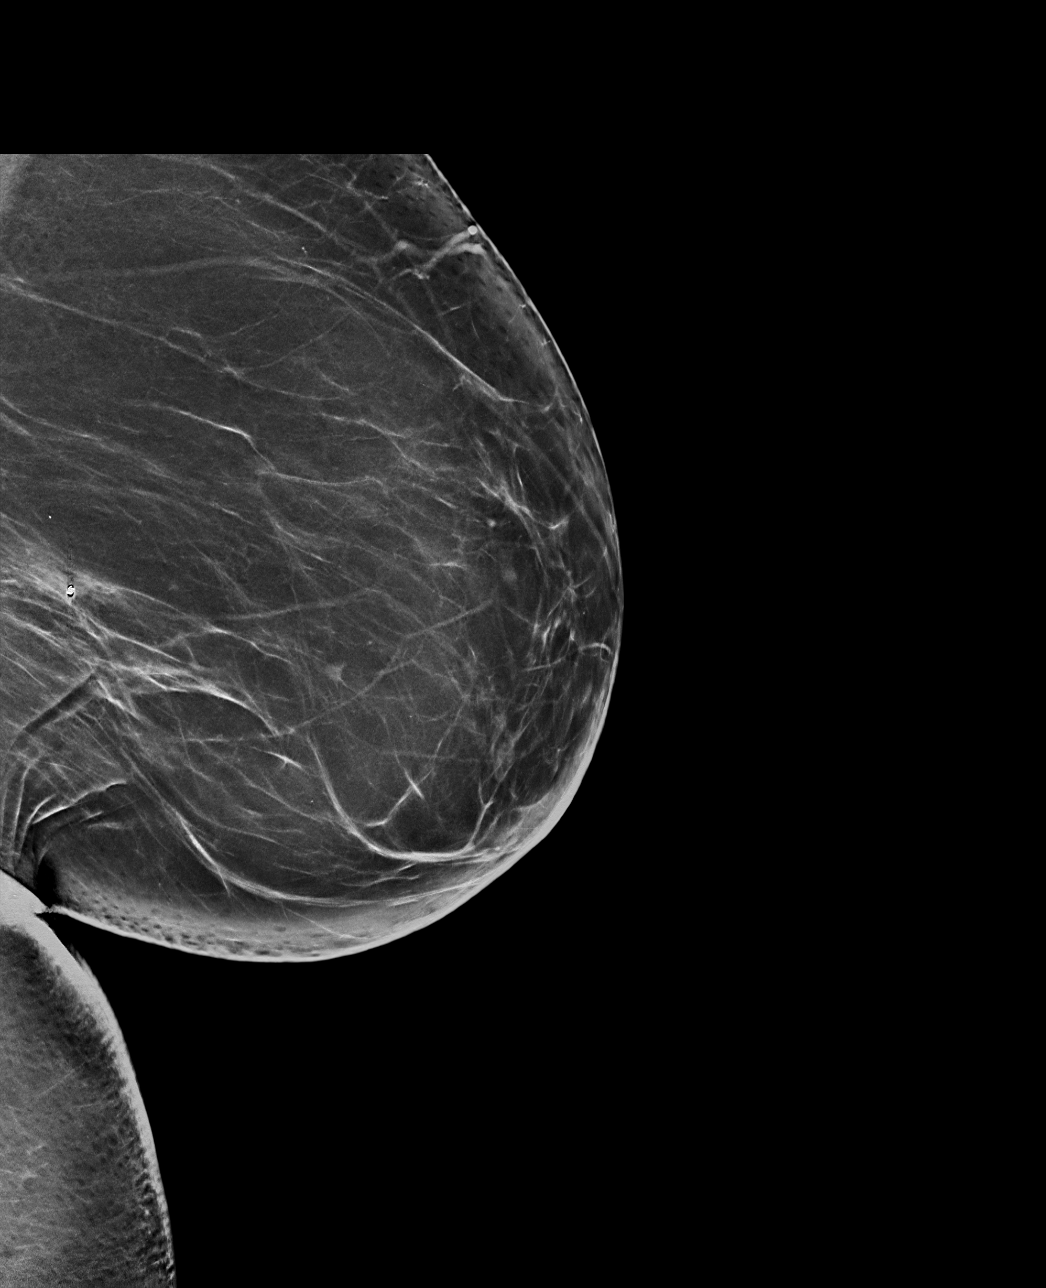

[L ML tomo · tomo slice 45/88.0]
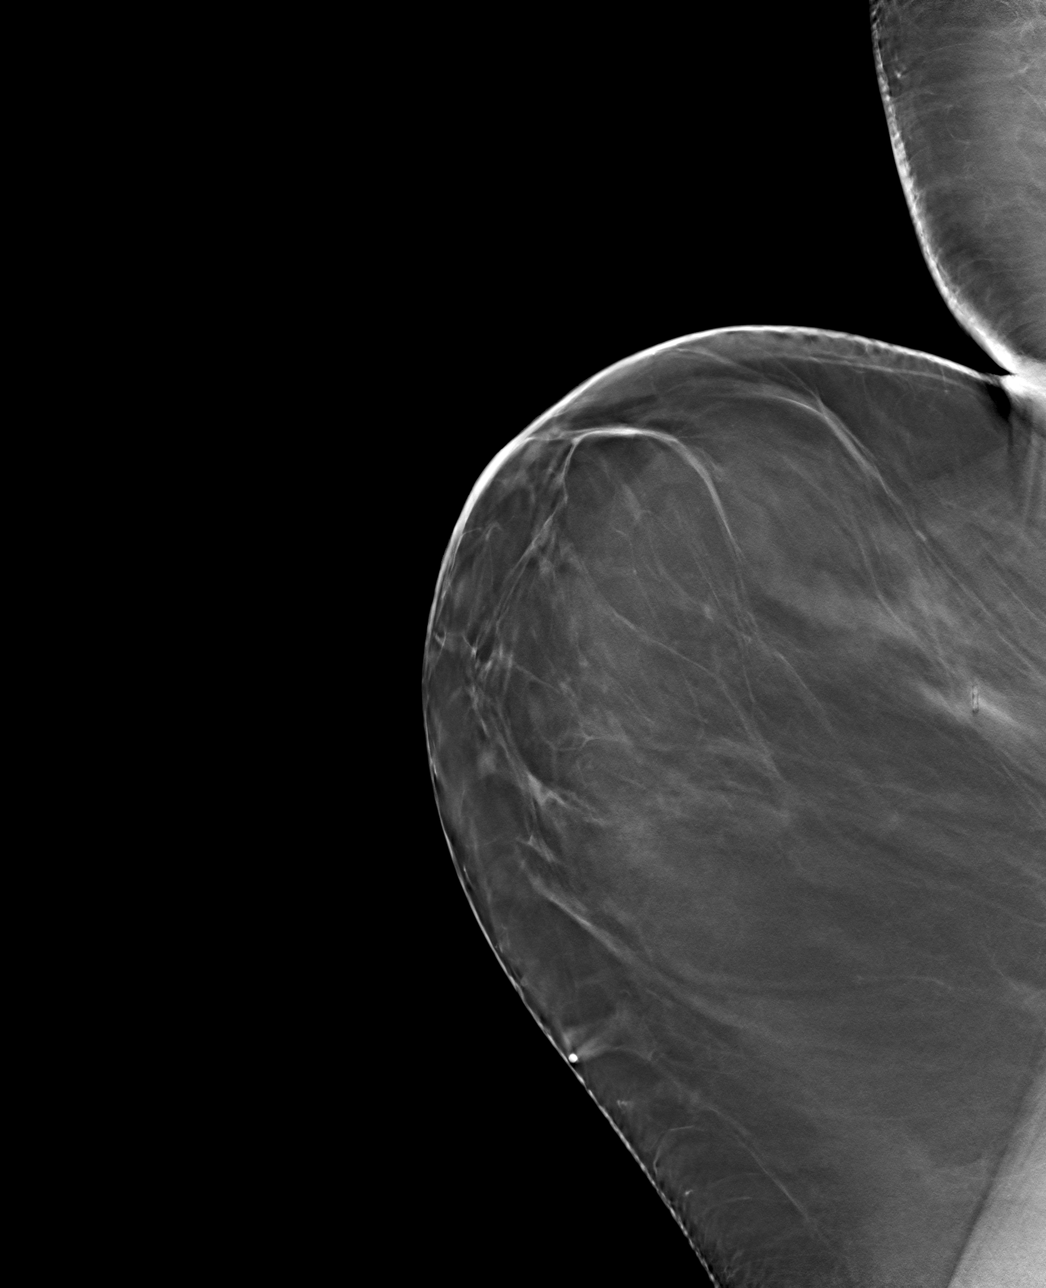

[L CC tomo · tomo slice 43/85.0]
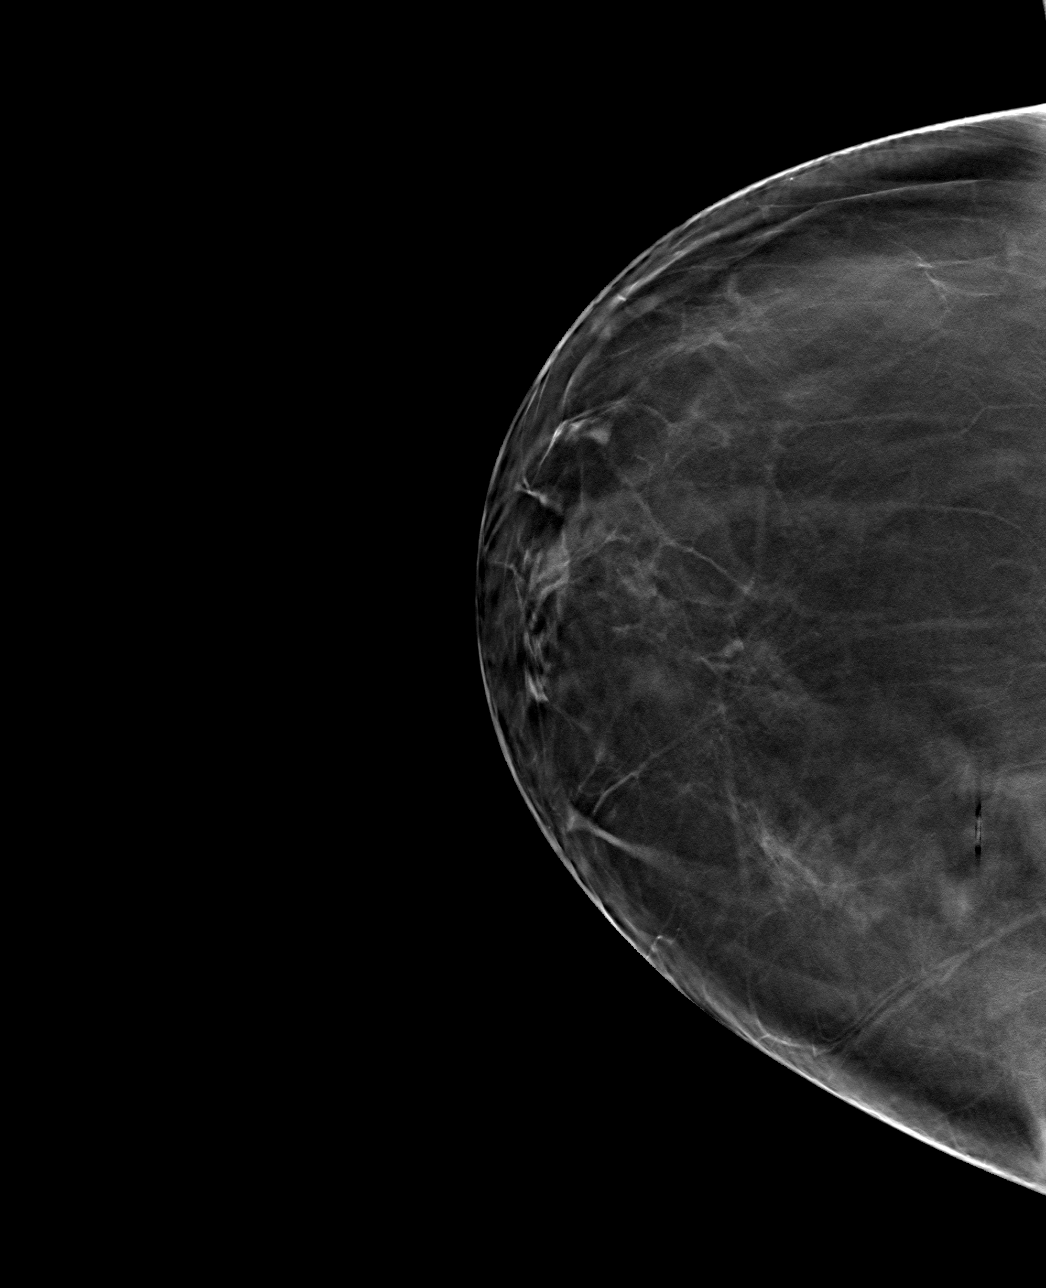

[4 of 12 positions shown; findings below may reference images not displayed]

FINDINGS: Mammographic images were obtained following ultrasound guided biopsy
of the targeted mass over the 3 o'clock position of the left breast.
Images were also obtained after ultrasound-guided biopsy of the
targeted mass over the 2 o'clock position of the right breast and
biopsy of the right axillary/subpectoral lymph node. Note that no
clip was placed in the right axillary/subpectoral lymph node. The
biopsy marking clips are in the expected location bilaterally.
IMPRESSION: Appropriate positioning of the coil shaped biopsy marking clip at
the site of biopsy in the outer midportion of the left breast.
Appropriate positioning of the heart shaped biopsy marking clip at
the site of biopsy in the inner upper quadrant of the right breast.

Final Assessment: Post Procedure Mammograms for Marker Placement

## 2020-03-01 IMAGING — MG MM BREAST LOCALIZATION CLIP
4 series · 4 of 12 positions shown · non-contrast
Comparison: Previous exam(s).

CLINICAL DATA: Patient is post ultrasound-guided core needle biopsy
of a 5 mm indeterminate mass over the 3 o'clock position of the left
breast as well as ultrasound-guided core needle biopsy of a 5 mm
right breast mass at the 2 o'clock position in addition to
ultrasound core biopsy of a anteromedial right axillary/subpectoral
lymph node. No clip was placed in the lymph node.

EXAM:
DIAGNOSTIC bilateral MAMMOGRAM POST ultrasound BIOPSY

[R CC synth-2D]
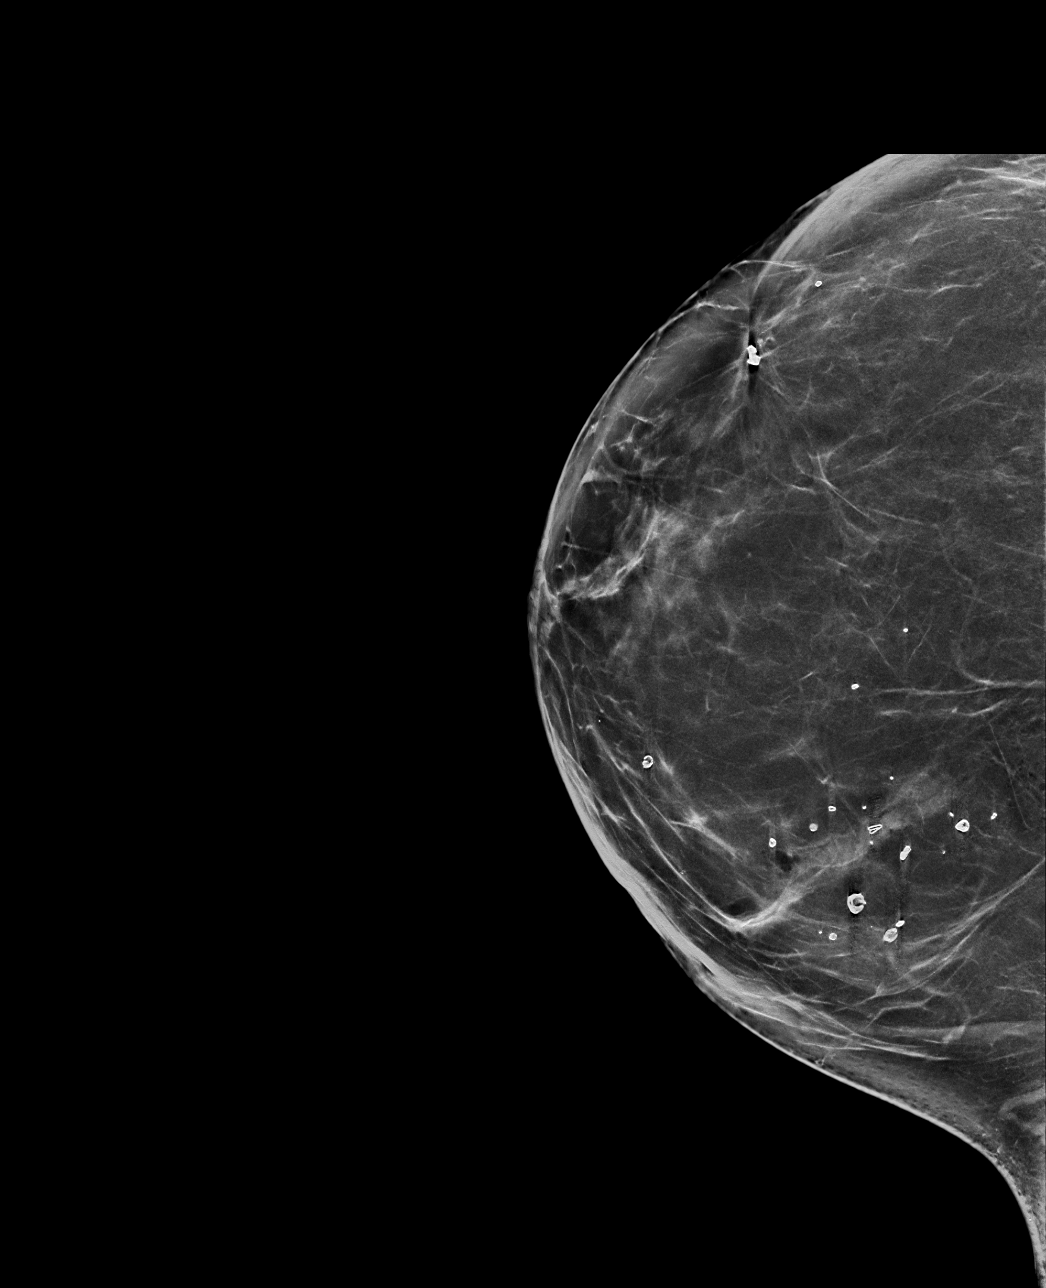

[R ML synth-2D]
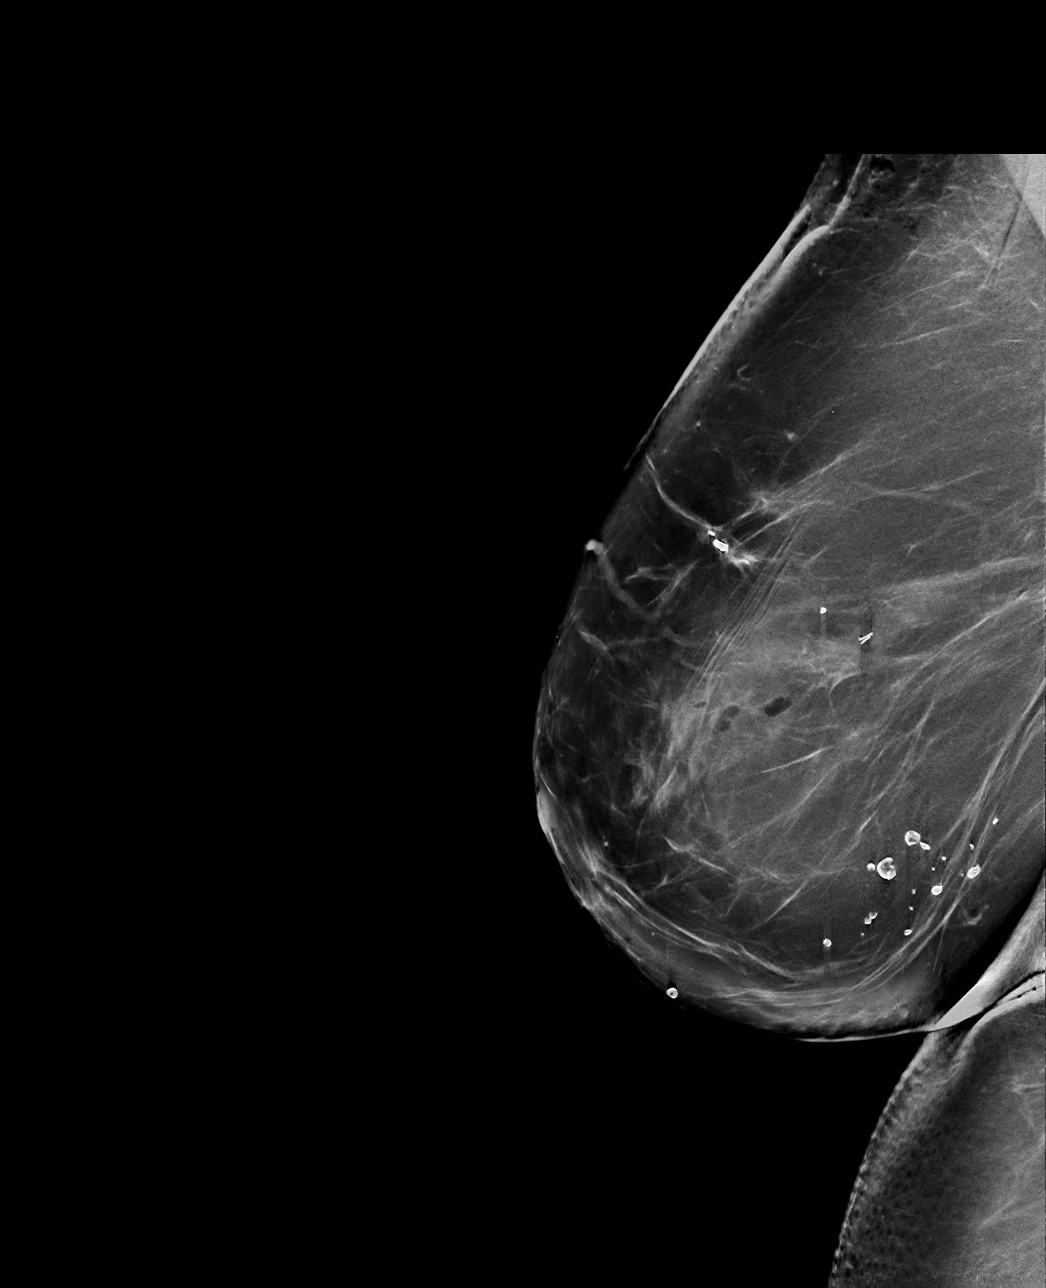

[R CC tomo · tomo slice 39/78.0]
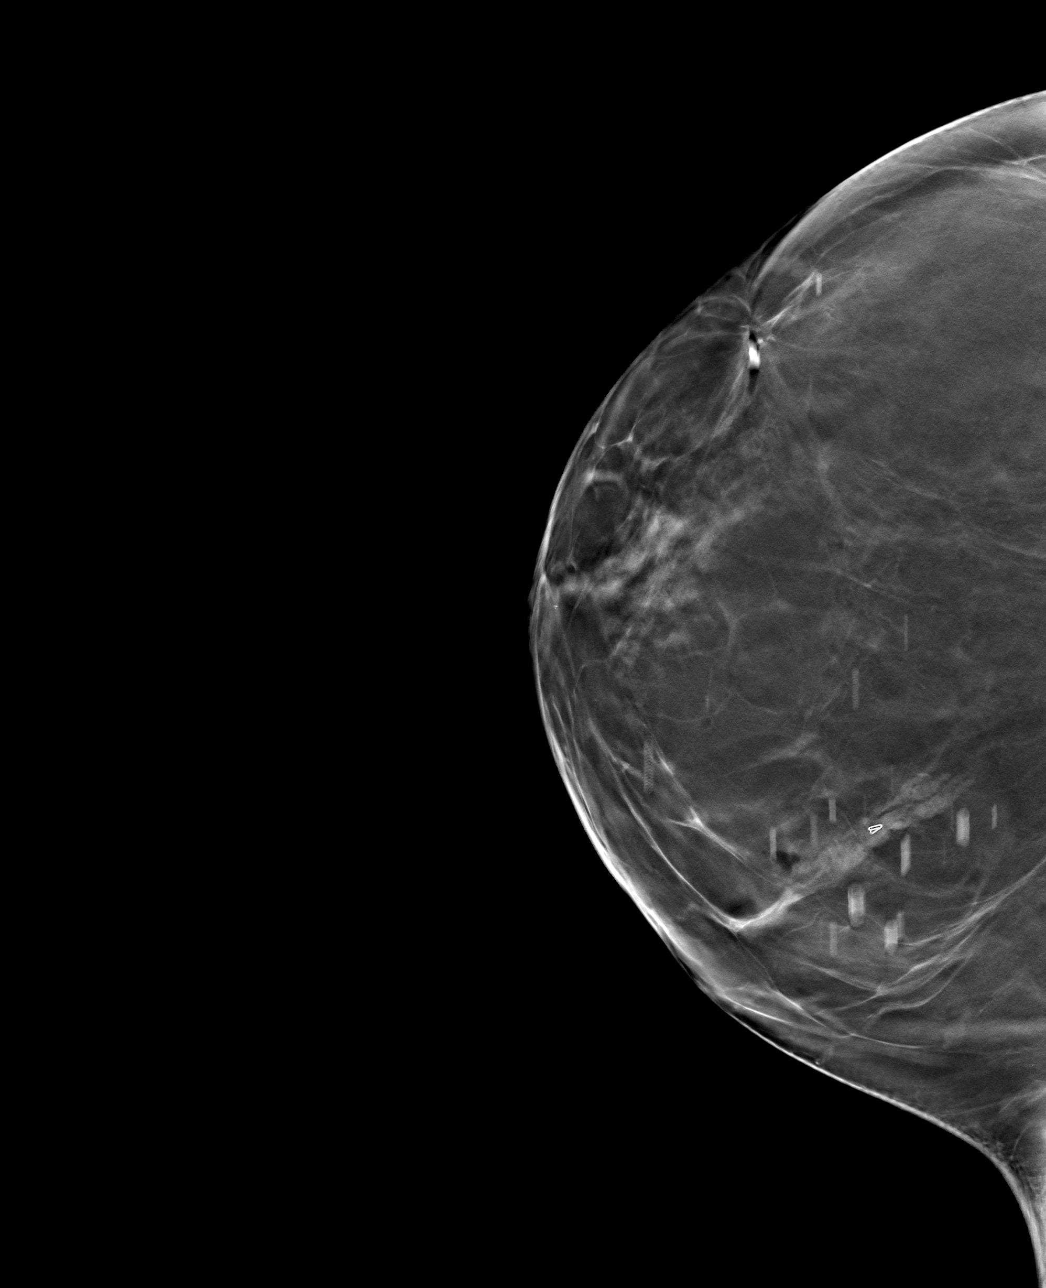

[R ML tomo · tomo slice 52/103.0]
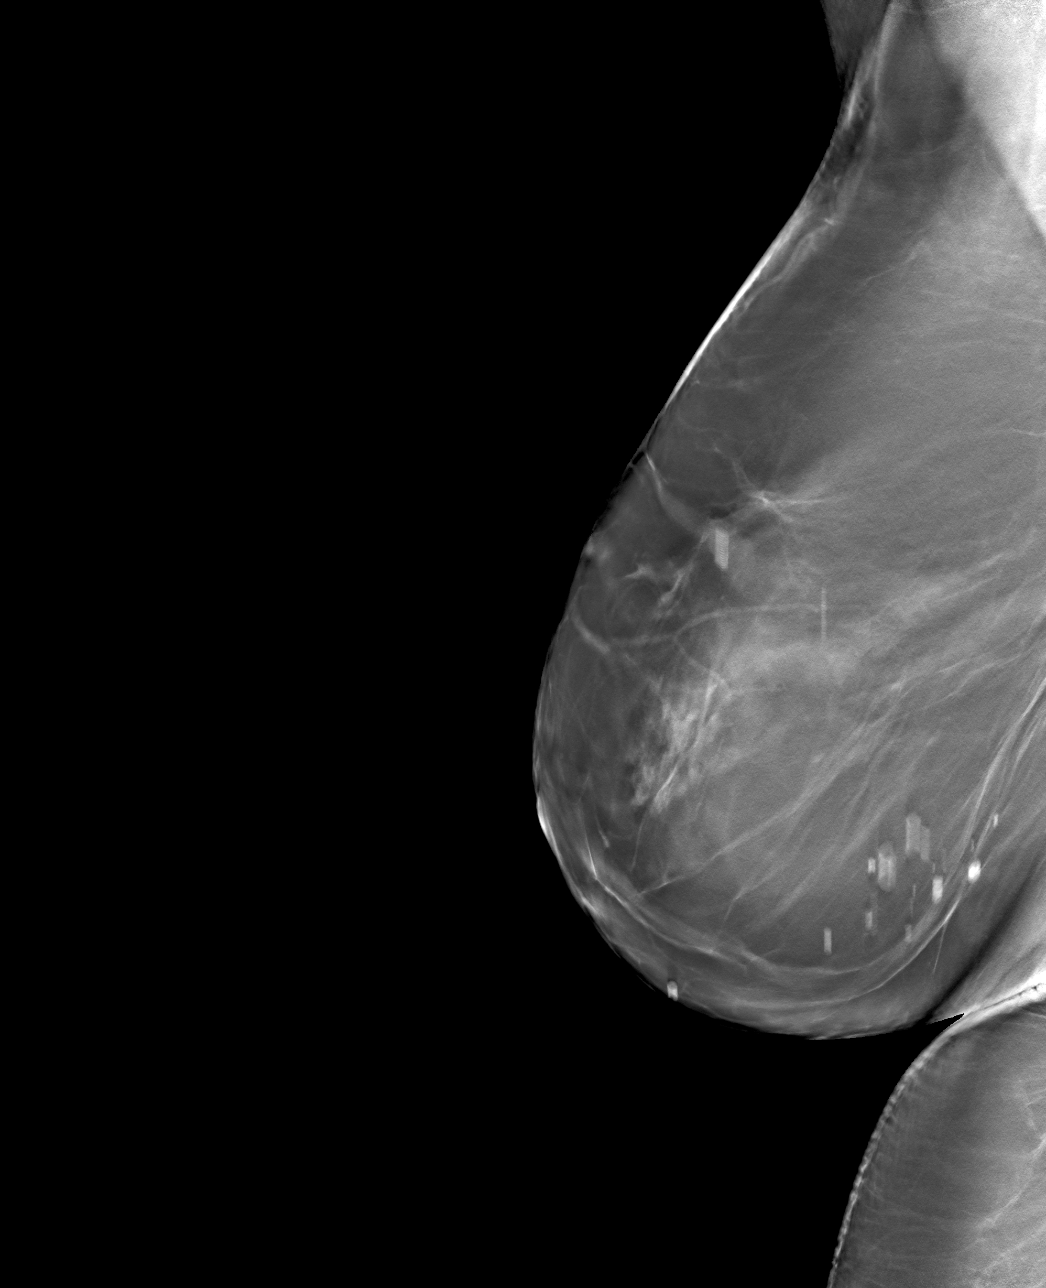

[4 of 12 positions shown; findings below may reference images not displayed]

FINDINGS: Mammographic images were obtained following ultrasound guided biopsy
of the targeted mass over the 3 o'clock position of the left breast.
Images were also obtained after ultrasound-guided biopsy of the
targeted mass over the 2 o'clock position of the right breast and
biopsy of the right axillary/subpectoral lymph node. Note that no
clip was placed in the right axillary/subpectoral lymph node. The
biopsy marking clips are in the expected location bilaterally.
IMPRESSION: Appropriate positioning of the coil shaped biopsy marking clip at
the site of biopsy in the outer midportion of the left breast.
Appropriate positioning of the heart shaped biopsy marking clip at
the site of biopsy in the inner upper quadrant of the right breast.

Final Assessment: Post Procedure Mammograms for Marker Placement

## 2020-03-01 IMAGING — US US  BREAST BX W/ LOC DEV 1ST LESION IMG BX SPEC US GUIDE*R*
1 series · 15 of 16 positions shown · non-contrast
Comparison: Previous exam(s).
COMPARISON: Previous exam(s).

Addendum:
CLINICAL DATA: Patient presents for ultrasound-guided core needle
biopsy of a 5 mm oval circumscribed mass over the 2 o'clock position
of the right breast 5 cm from the nipple as well as an abnormal
right axillary/subpectoral lymph node.

EXAM:
ULTRASOUND GUIDED RIGHT BREAST CORE NEEDLE BIOPSY

[Series 1: us breast bx w/ loc dev 1st lesion img bx spec us  · 0.07mm/px · 15 of 16 slices shown]
[im 1/16]
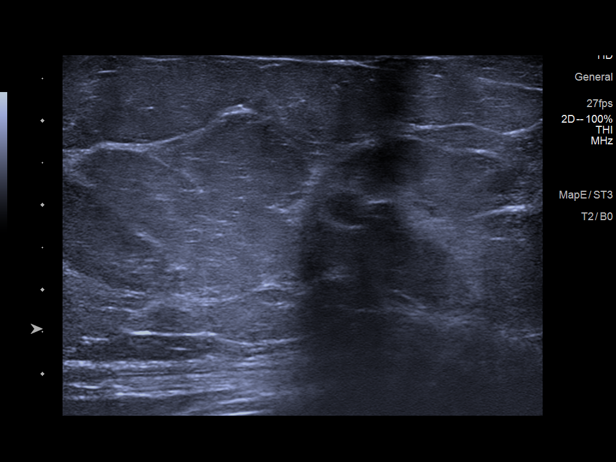
[im 2/16]
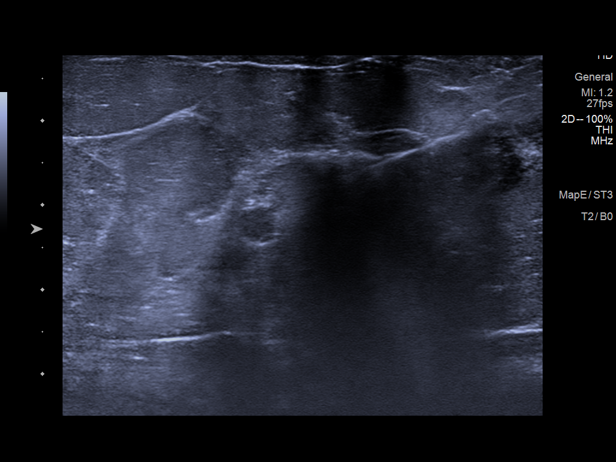
[im 3/16]
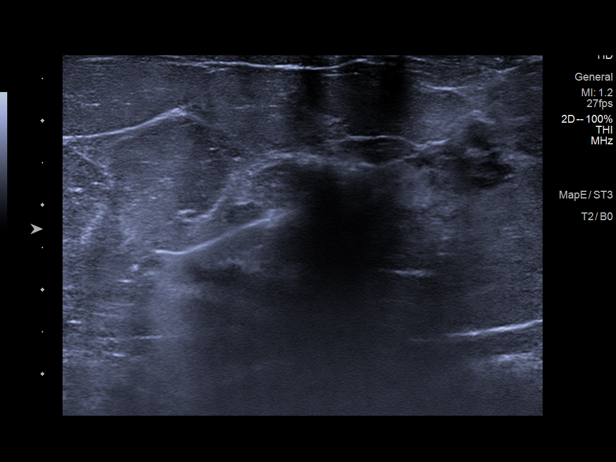
[im 4/16]
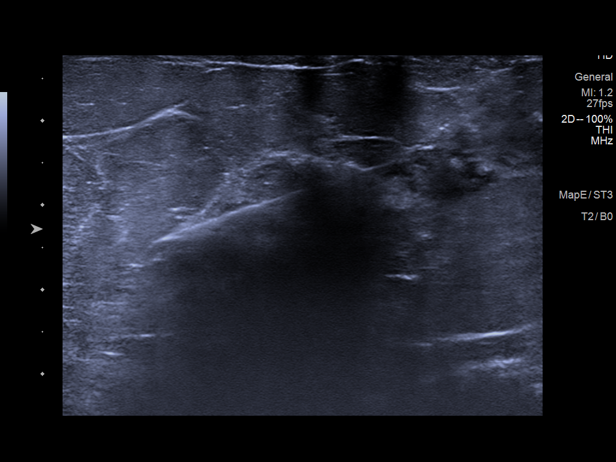
[im 5/16]
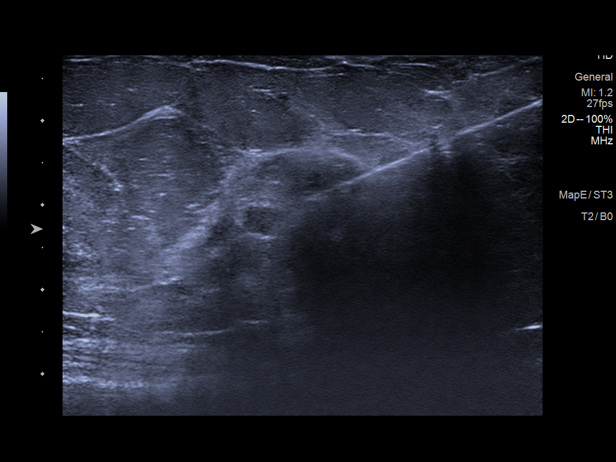
[im 6/16]
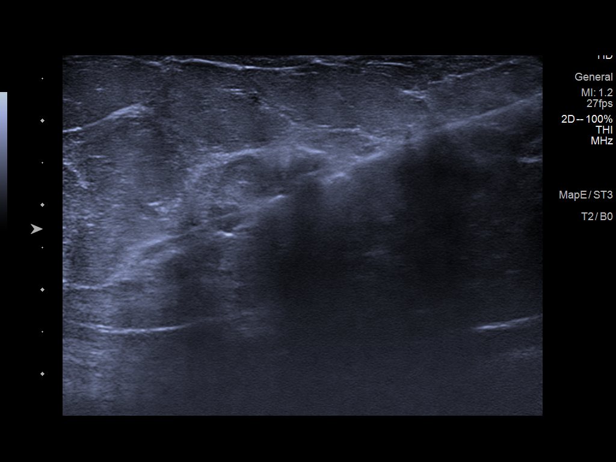
[im 7/16]
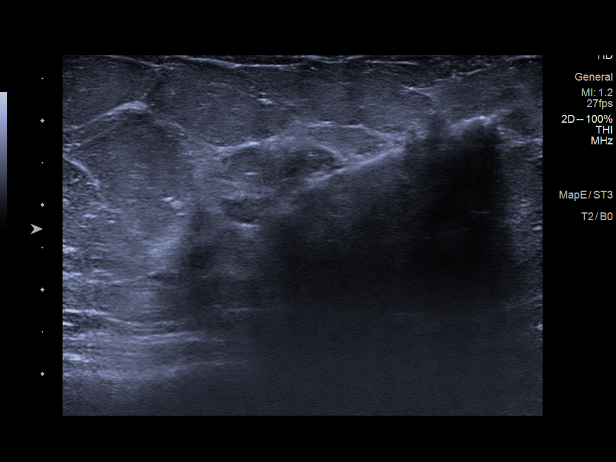
[im 9/16]
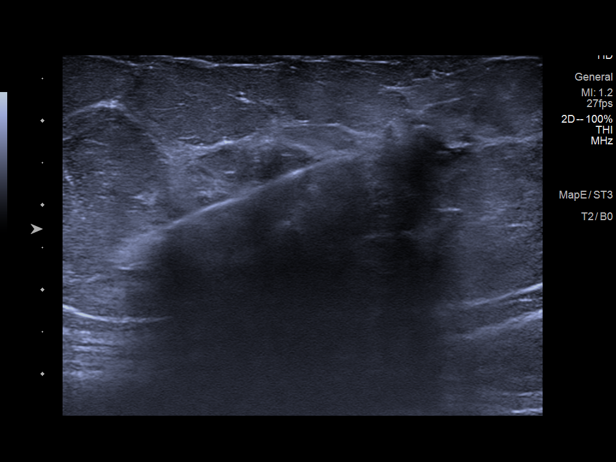
[im 10/16]
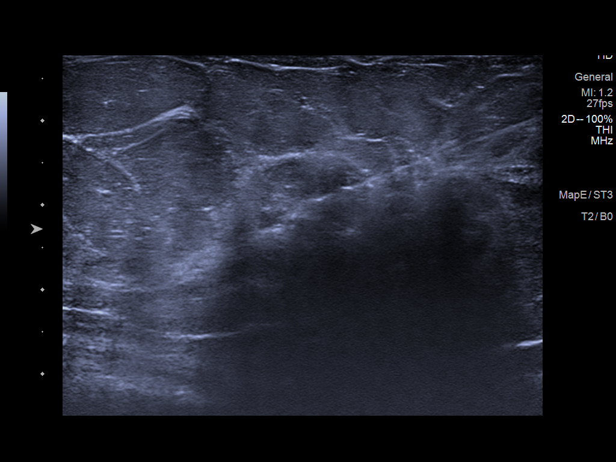
[im 11/16]
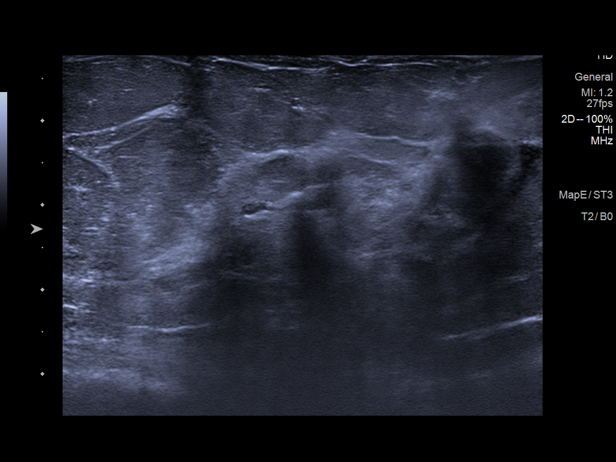
[im 12/16]
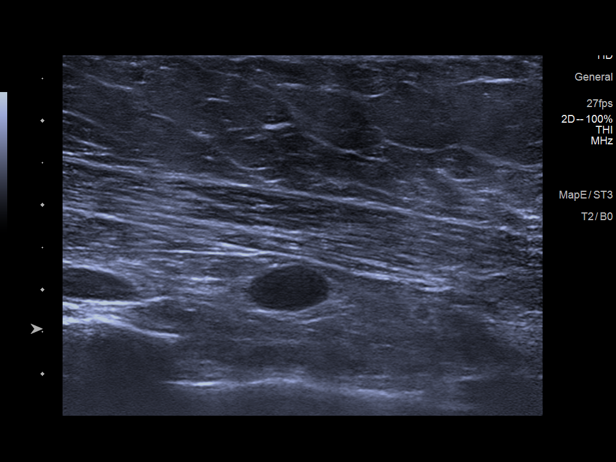
[im 13/16]
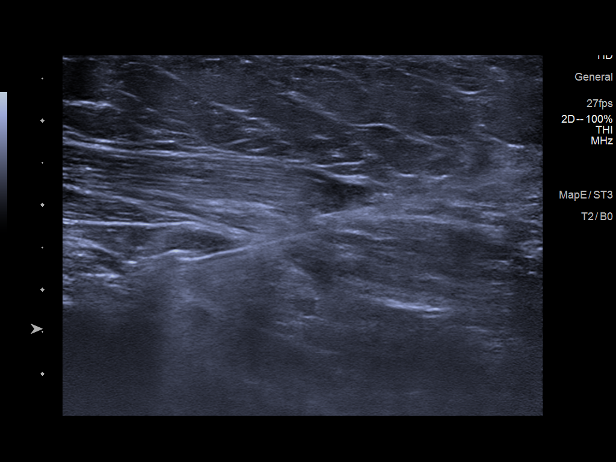
[im 14/16]
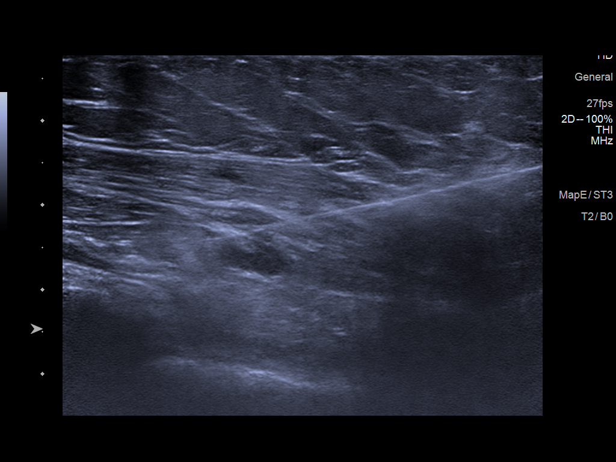
[im 15/16]
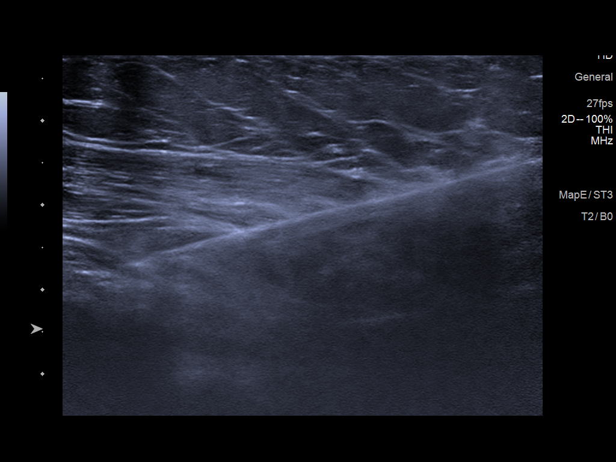
[im 16/16]
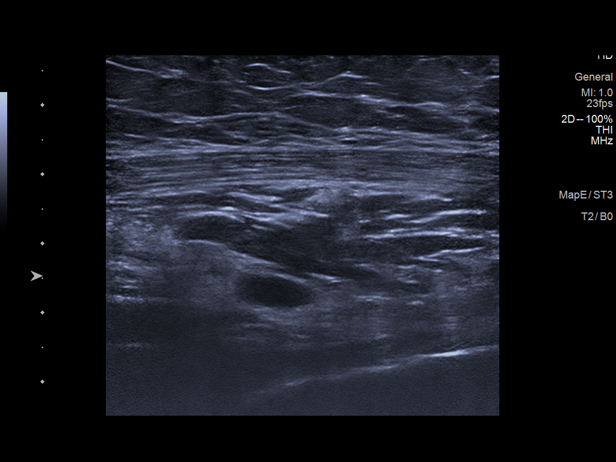

[15 of 16 positions shown; findings below may reference images not displayed]



1.  Lesion quadrant: Right upper inner quadrant.

Using sterile technique and 1% Lidocaine as local anesthetic, under
direct ultrasound visualization, a 14 gauge BOSANGU device was
used to perform biopsy of the targeted mass at the 2 o'clock
position of the right breast using a inferior to superior approach.
Three core tissue specimens were obtained. At the conclusion of the
procedure heart shaped tissue marker clip was deployed into the
biopsy cavity. Follow up 2 view mammogram was performed and dictated
separately.

2.  Lesion quadrant: Anterior right axilla/subpectoral.

Using sterile technique and 1% Lidocaine as local anesthetic, under
direct ultrasound visualization, a 14 gauge BOSANGU device was
used to perform biopsy of the targeted lymph node in the anterior
right axilla/subpectoral region using a medial to lateral approach.
A single specimen was obtained as no further specimens were obtain
due to the difficult access to this node with artery and vein in
close proximity. No HydroMARK clip was placed due to the close
proximity of the lymph node 2 adjacent artery and vein. Small
hematoma was noted in the subpectoral biopsy site as extra direct
pressure was applied post biopsy.
IMPRESSION: Ultrasound guided biopsy of an indeterminate 5 mm mass over the 2
o'clock position of the right breast as well as abnormal right
axillary/subpectoral lymph node.

ADDENDUM:
PATHOLOGY revealed:

A. RIGHT BREAST, [DATE] [V3]; ULTRASOUND-GUIDED BIOPSY: - INVASIVE
MAMMARY CARCINOMA, NO SPECIAL TYPE. 6 mm in this sample. Grade 2.
Ductal carcinoma in situ: Not identified.Lymphovascular invasion:
Not identified

B. LYMPH NODE, RIGHT AXILLA; ULTRASOUND-GUIDED NEEDLE CORE BIOPSY: -
BENIGN LYMPH NODE. - NEGATIVE FOR MALIGNANCY.

C. LEFT BREAST, [DATE] [V3]; ULTRASOUND-GUIDED BIOPSY: - INVASIVE
MAMMARY CARCINOMA, WITH LOBULAR FEATURES. 3 mm in this sample. Grade
2. Ductal carcinoma in situ: Not identified. Lymphovascular
invasion: Not identified.

Pathology results are CONCORDANT with imaging findings, per Dr.
BOSANGU.

Pathology results and recommendations below were discussed with
patient by telephone on [DATE]. Patient reported biopsy site doing
well with slight tenderness at the site. Post biopsy care
instructions were reviewed and questions were answered. Patient was
instructed to call [HOSPITAL] if any concerns or
questions arise related to the biopsy.

Recommendation: Surgical referral and MRI due to lobular features.
Request for surgical referral was relayed to BOSANGU RN and
BOSANGU RN at [HOSPITAL] [HOSPITAL] by BOSANGU
RN on [DATE]. Patient requested Dr. BOSANGU at [REDACTED] for her surgical consultation.

Addendum by BOSANGU RN on [DATE].



1.  Lesion quadrant: Right upper inner quadrant.

Using sterile technique and 1% Lidocaine as local anesthetic, under
direct ultrasound visualization, a 14 gauge BOSANGU device was
used to perform biopsy of the targeted mass at the 2 o'clock
position of the right breast using a inferior to superior approach.
Three core tissue specimens were obtained. At the conclusion of the
procedure heart shaped tissue marker clip was deployed into the
biopsy cavity. Follow up 2 view mammogram was performed and dictated
separately.

2.  Lesion quadrant: Anterior right axilla/subpectoral.

Using sterile technique and 1% Lidocaine as local anesthetic, under
direct ultrasound visualization, a 14 gauge BOSANGU device was
used to perform biopsy of the targeted lymph node in the anterior
right axilla/subpectoral region using a medial to lateral approach.
A single specimen was obtained as no further specimens were obtain
due to the difficult access to this node with artery and vein in
close proximity. No HydroMARK clip was placed due to the close
proximity of the lymph node 2 adjacent artery and vein. Small
hematoma was noted in the subpectoral biopsy site as extra direct
pressure was applied post biopsy.
IMPRESSION: Ultrasound guided biopsy of an indeterminate 5 mm mass over the 2
o'clock position of the right breast as well as abnormal right
axillary/subpectoral lymph node.

## 2020-03-01 IMAGING — MG US BREAST BX W LOC DEV 1ST LESION IMG BX SPEC US GUIDE*L*
1 series · 8 of 8 positions shown · non-contrast
Comparison: Previous exam(s).
COMPARISON: Previous exam(s).

Addendum:
CLINICAL DATA: Patient presents for ultrasound-guided core needle
biopsy of a 5 mm irregular mass over the 3 o'clock position of the
left breast 6 cm from the nipple.

EXAM:
ULTRASOUND GUIDED LEFT BREAST CORE NEEDLE BIOPSY

[Series 1: MG view · 0.07mm/px · 8 of 12 slices shown]
[im 1/12]
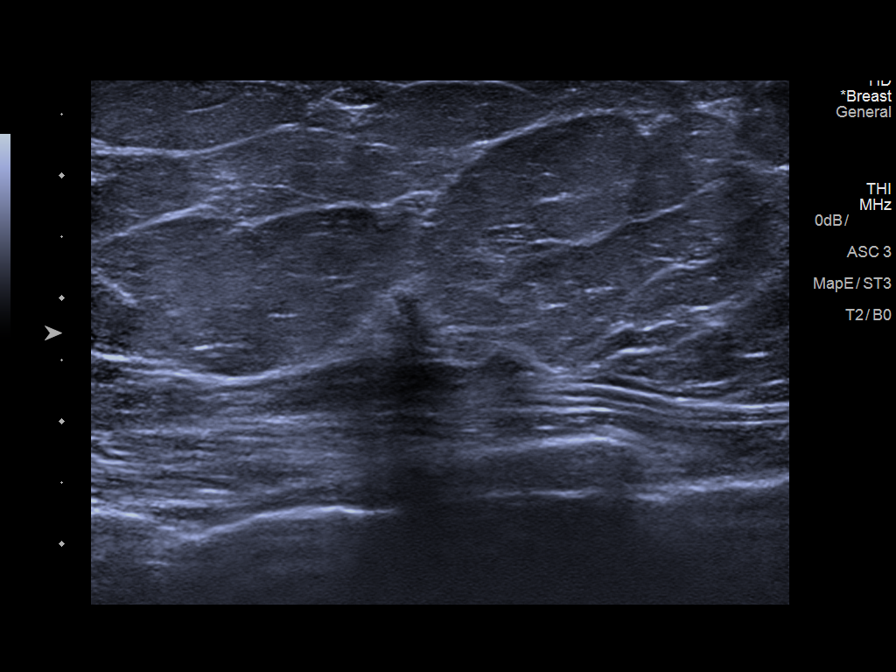
[im 2/12]
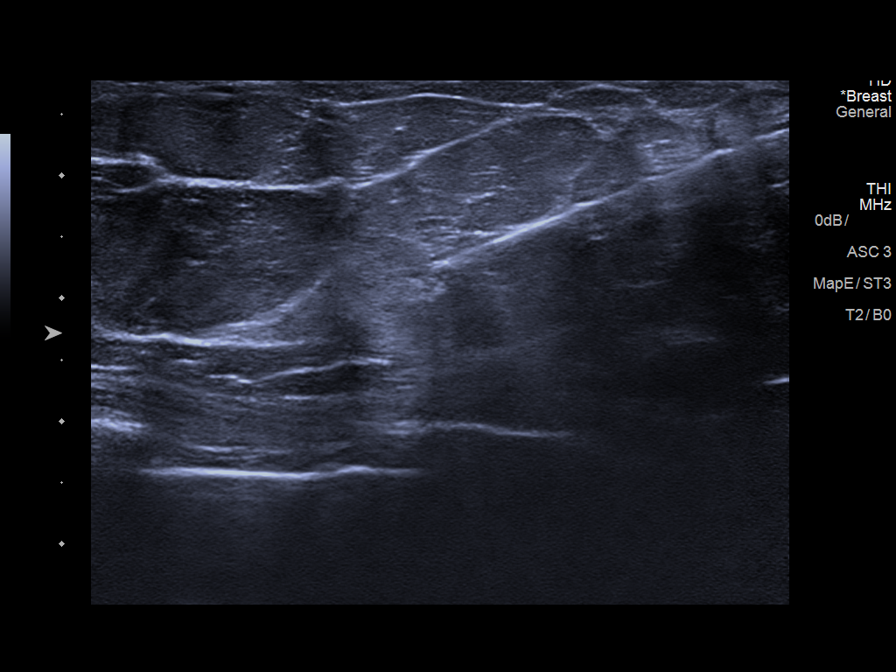
[im 4/12]
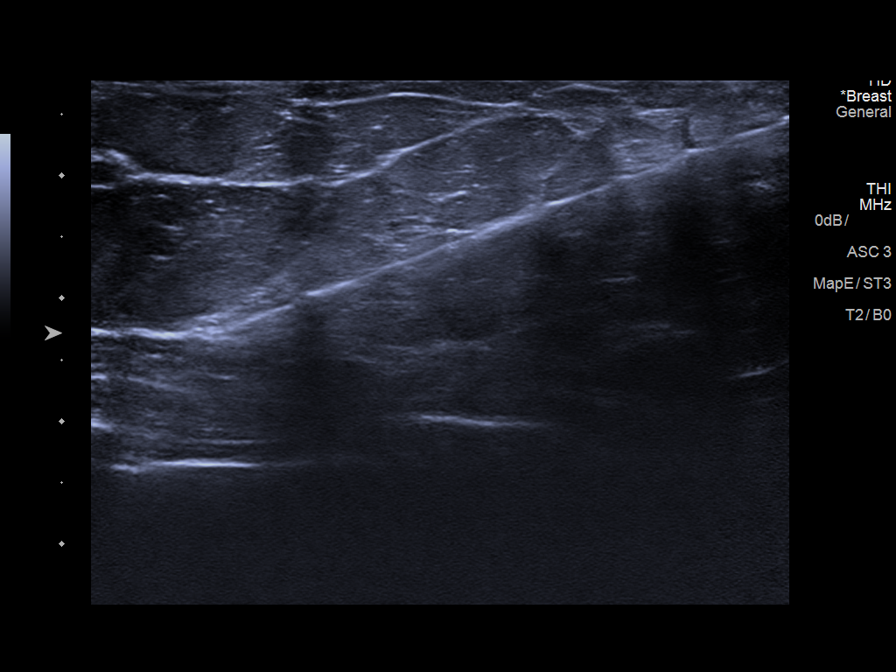
[im 5/12]
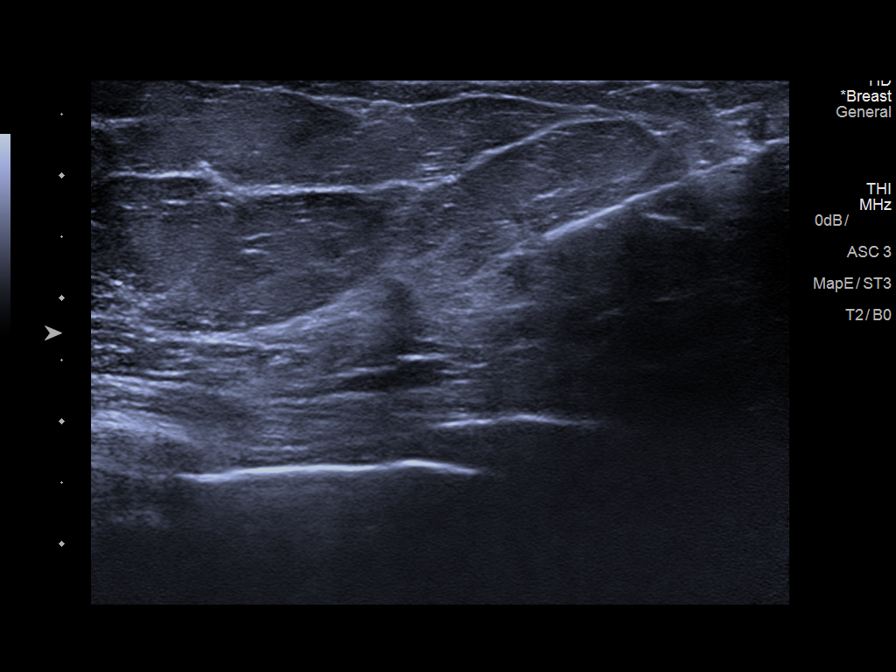
[im 7/12]
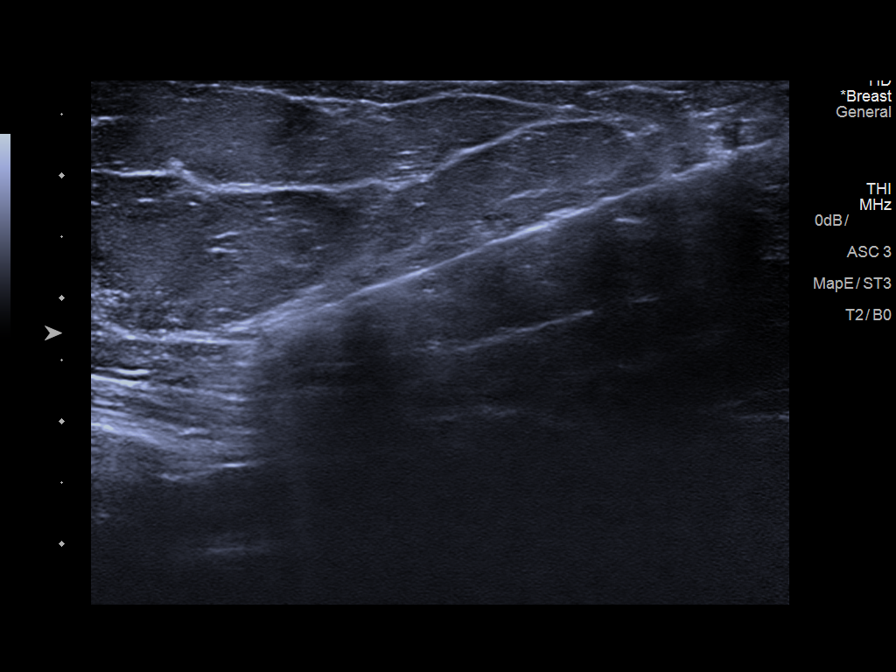
[im 8/12]
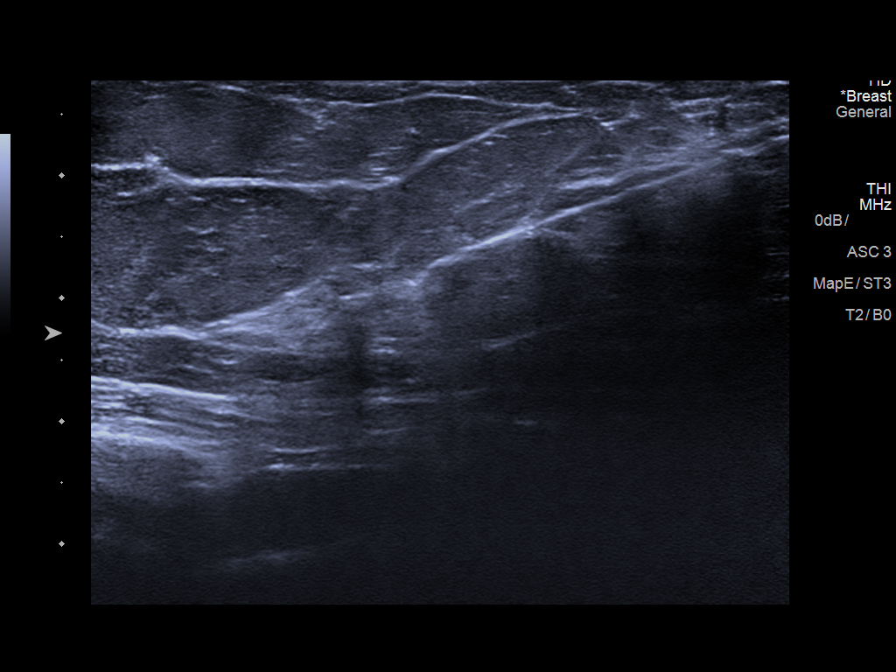
[im 10/12]
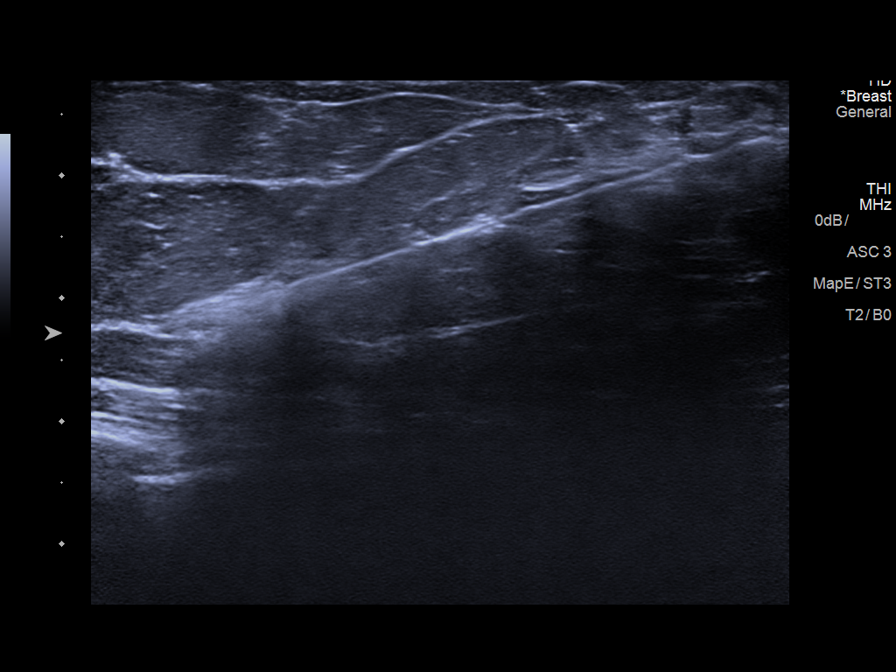
[im 12/12]
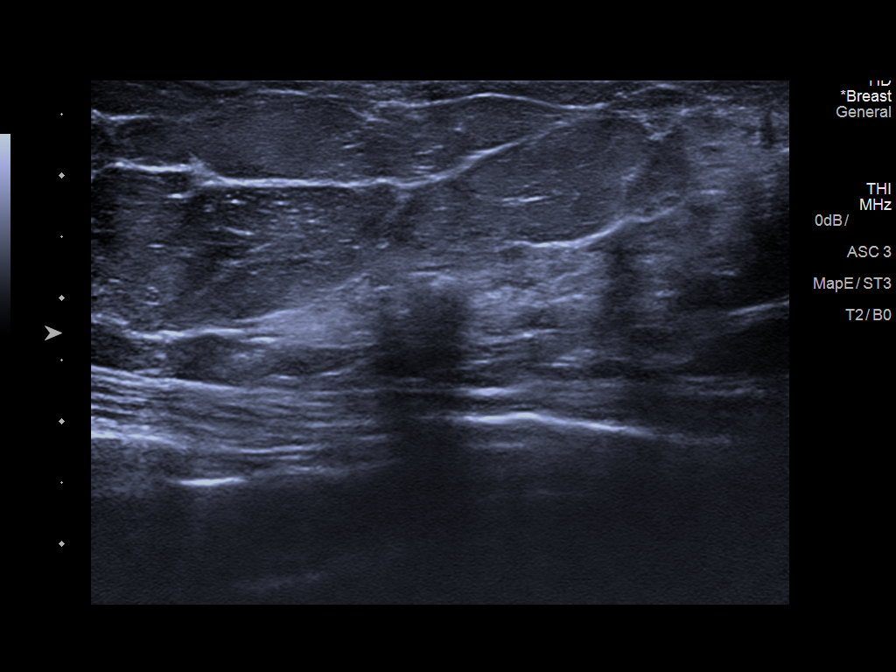

[8 of 8 positions shown; findings below may reference images not displayed]



Lesion quadrant: Left upper outer quadrant (2:30-3 o'clock
position).

Using sterile technique and 1% Lidocaine as local anesthetic, under
direct ultrasound visualization, a 14 gauge AYTUR device was
used to perform biopsy of the targeted mass at the 3 o'clock
position using a inferior to superior approach. Three core tissue
specimens were obtained. At the conclusion of the procedure coil
shaped tissue marker clip was deployed into the biopsy cavity.
Follow up 2 view mammogram was performed and dictated separately.
IMPRESSION: Ultrasound guided biopsy of a 5 mm indeterminate left breast mass.
No apparent complications.

ADDENDUM:
PATHOLOGY revealed:

A. RIGHT BREAST, [DATE] [FI]; ULTRASOUND-GUIDED BIOPSY: - INVASIVE
MAMMARY CARCINOMA, NO SPECIAL TYPE. 6 mm in this sample. Grade 2.
Ductal carcinoma in situ: Not identified.Lymphovascular invasion:
Not identified

B. LYMPH NODE, RIGHT AXILLA; ULTRASOUND-GUIDED NEEDLE CORE BIOPSY: -
BENIGN LYMPH NODE. - NEGATIVE FOR MALIGNANCY.

C. LEFT BREAST, [DATE] [FI]; ULTRASOUND-GUIDED BIOPSY: - INVASIVE
MAMMARY CARCINOMA, WITH LOBULAR FEATURES. 3 mm in this sample. Grade
2. Ductal carcinoma in situ: Not identified. Lymphovascular
invasion: Not identified.

Pathology results are CONCORDANT with imaging findings, per Dr.
AYTUR.

Pathology results and recommendations below were discussed with
patient by telephone on [DATE]. Patient reported biopsy site doing
well with slight tenderness at the site. Post biopsy care
instructions were reviewed and questions were answered. Patient was
instructed to call [HOSPITAL] if any concerns or
questions arise related to the biopsy.

Recommendation: Surgical referral and MRI due to lobular features.
Request for surgical referral was relayed to AYTUR RN and
AYTUR RN at [HOSPITAL] [HOSPITAL] by AYTUR
RN on [DATE]. Patient requested Dr. AYTUR at [REDACTED] for her surgical consultation.

Addendum by AYTUR RN on [DATE].



Lesion quadrant: Left upper outer quadrant (2:30-3 o'clock
position).

Using sterile technique and 1% Lidocaine as local anesthetic, under
direct ultrasound visualization, a 14 gauge AYTUR device was
used to perform biopsy of the targeted mass at the 3 o'clock
position using a inferior to superior approach. Three core tissue
specimens were obtained. At the conclusion of the procedure coil
shaped tissue marker clip was deployed into the biopsy cavity.
Follow up 2 view mammogram was performed and dictated separately.
IMPRESSION: Ultrasound guided biopsy of a 5 mm indeterminate left breast mass.
No apparent complications.

## 2020-03-02 DIAGNOSIS — C50912 Malignant neoplasm of unspecified site of left female breast: Secondary | ICD-10-CM

## 2020-03-02 DIAGNOSIS — C50911 Malignant neoplasm of unspecified site of right female breast: Secondary | ICD-10-CM

## 2020-03-03 NOTE — Progress Notes (Signed)
Initiated navigation.  Scheduled Med/Onc consult with Dr. Janese Banks on 4/12.  Referral to [facility], Dr. Lucia Gaskins.

## 2020-03-04 DIAGNOSIS — C50911 Malignant neoplasm of unspecified site of right female breast: Secondary | ICD-10-CM | POA: Diagnosis not present

## 2020-03-04 DIAGNOSIS — Z853 Personal history of malignant neoplasm of breast: Secondary | ICD-10-CM | POA: Diagnosis not present

## 2020-03-04 DIAGNOSIS — C50912 Malignant neoplasm of unspecified site of left female breast: Secondary | ICD-10-CM | POA: Diagnosis not present

## 2020-03-07 ENCOUNTER — Encounter: Payer: Self-pay | Admitting: Oncology

## 2020-03-07 ENCOUNTER — Inpatient Hospital Stay: Payer: 59 | Attending: Oncology | Admitting: Oncology

## 2020-03-07 ENCOUNTER — Other Ambulatory Visit: Payer: Self-pay

## 2020-03-07 ENCOUNTER — Inpatient Hospital Stay: Payer: 59

## 2020-03-07 VITALS — BP 140/89 | HR 83 | Temp 97.0°F | Resp 20 | Wt 201.4 lb

## 2020-03-07 DIAGNOSIS — C50412 Malignant neoplasm of upper-outer quadrant of left female breast: Secondary | ICD-10-CM | POA: Insufficient documentation

## 2020-03-07 DIAGNOSIS — Z853 Personal history of malignant neoplasm of breast: Secondary | ICD-10-CM | POA: Diagnosis not present

## 2020-03-07 DIAGNOSIS — C50912 Malignant neoplasm of unspecified site of left female breast: Secondary | ICD-10-CM

## 2020-03-07 DIAGNOSIS — Z923 Personal history of irradiation: Secondary | ICD-10-CM | POA: Diagnosis not present

## 2020-03-07 DIAGNOSIS — C50211 Malignant neoplasm of upper-inner quadrant of right female breast: Secondary | ICD-10-CM | POA: Insufficient documentation

## 2020-03-07 DIAGNOSIS — Z7189 Other specified counseling: Secondary | ICD-10-CM | POA: Diagnosis not present

## 2020-03-07 DIAGNOSIS — C50911 Malignant neoplasm of unspecified site of right female breast: Secondary | ICD-10-CM

## 2020-03-07 NOTE — Progress Notes (Signed)
Patient is here for new patient appointment referred by Dr. Doy Hutching.

## 2020-03-08 ENCOUNTER — Other Ambulatory Visit: Payer: Self-pay | Admitting: Surgery

## 2020-03-08 DIAGNOSIS — Z853 Personal history of malignant neoplasm of breast: Secondary | ICD-10-CM

## 2020-03-10 ENCOUNTER — Encounter: Payer: Self-pay | Admitting: Oncology

## 2020-03-10 DIAGNOSIS — Z7189 Other specified counseling: Secondary | ICD-10-CM | POA: Insufficient documentation

## 2020-03-10 DIAGNOSIS — C50911 Malignant neoplasm of unspecified site of right female breast: Secondary | ICD-10-CM | POA: Insufficient documentation

## 2020-03-10 NOTE — Progress Notes (Signed)
Hematology/Oncology Consult note [facility] Telephone:([phone] [phone]  Patient Care Team: Idelle Crouch, MD as PCP - General (Internal Medicine) Theodore Demark, RN as Oncology Nurse Navigator Noreene Filbert, MD as Radiation Oncologist (Radiation Oncology)   Name of the patient: Marissa Phillips  [id number]  [birth year]    Reason for referral-new diagnosis of breast cancer   Referring physician- bcep program  Date of visit: 03/10/20   History of presenting illness- Patient is a 59 year old female who recently underwent a bilateral screening mammogram on 07/13/2020 which showed possible mass in both the right and left breast.  This was followed by a diagnostic mammogram and ultrasound.  Right breast demonstrated 0.6 x 0.5 x 0.5 cm hypoechoic mass in the left breast demonstrated 0.5 x 0.5 x 0.4 cm hypoechoic mass.  There was an abnormal right axillary lymph node noted with cortical thickening.  All these areas were biopsied.  Right axillary lymph node was negative for malignancy.  Right breast mass showed invasive mammary carcinoma 6 mm grade 3.  Left breast mass showed invasive mammary carcinoma with lobular features 3 mm grade 2.  ER/PR and HER-2 status was pending at the time of my visit  Patient has a prior history of ER positive breast cancer back in 2005.  She is s/p surgery and radiation and hormone therapy at that time for 5 years  Currently patient is doing well and denies any significant complaints at this time.  Patient has a bilateral breast MRI scheduled for 03/16/2020.  Menarche at the age of 38.  She has 1 daughter.  Age at first birth 15.  Last menstrual.  In 2006.  No use of hormone replacement therapy.  She has had bilateral oophorectomy but ovaries are still in place.  ECOG PS- 1  Pain scale- 0   Review of systems- Review of Systems  Constitutional: Negative for chills, fever, malaise/fatigue and weight loss.  HENT: Negative  for congestion, ear discharge and nosebleeds.   Eyes: Negative for blurred vision.  Respiratory: Negative for cough, hemoptysis, sputum production, shortness of breath and wheezing.   Cardiovascular: Negative for chest pain, palpitations, orthopnea and claudication.  Gastrointestinal: Negative for abdominal pain, blood in stool, constipation, diarrhea, heartburn, melena, nausea and vomiting.  Genitourinary: Negative for dysuria, flank pain, frequency, hematuria and urgency.  Musculoskeletal: Negative for back pain, joint pain and myalgias.  Skin: Negative for rash.  Neurological: Negative for dizziness, tingling, focal weakness, seizures, weakness and headaches.  Endo/Heme/Allergies: Does not bruise/bleed easily.  Psychiatric/Behavioral: Negative for depression and suicidal ideas. The patient does not have insomnia.     Allergies  Allergen Reactions  . Celebrex [Celecoxib] Rash  . Clindamycin Hcl Rash  . Loratadine Rash  . Omeprazole Other (See Comments)    After 1 week of therapy dry mouth and mild  urinary retention -bothersome enough for her stop it.   Marland Kitchen Penicillins Rash    Slight redness and itching   . Sulfa Antibiotics Rash    Patient Active Problem List   Diagnosis Date Noted  . Hyperlipidemia, mild 01/02/2016  . Anemia 07/06/2014  . Asthma 07/06/2014  . Environmental allergies 07/06/2014  . GERD (gastroesophageal reflux disease) 07/06/2014  . History of recurrent UTIs 07/06/2014  . Osteoporosis 07/06/2014  . Screening for cervical cancer 09/25/2011  . History of breast cancer 09/25/2011     Past Medical History:  Diagnosis Date  . Asthma   . Breast cancer ([place]) 2005  . Cancer ([place])  Breast- Right-2005  . Cataract   . Osteoporosis   . Personal history of chemotherapy   . Personal history of radiation therapy      Past Surgical History:  Procedure Laterality Date  . BREAST BIOPSY Right 2005   +  . BREAST BIOPSY Right 03/01/2020   [place] bx of mass at 2:00  heart marker, path pending  . BREAST BIOPSY Left 03/01/2020   [place] bx mass at 3:00, coil marker, path pending  . BREAST BIOPSY Right 03/01/2020   [place] bx of right axilla, no marker placed, path pending  . BREAST LUMPECTOMY Right 2005  . BREAST LUMPECTOMY WITH NEEDLE LOCALIZATION AND AXILLARY SENTINEL LYMPH NODE BX Right 2005  . BREAST SURGERY    . COLONOSCOPY WITH PROPOFOL N/A 04/02/2017   Procedure: COLONOSCOPY WITH PROPOFOL;  Surgeon: Jonathon Bellows, MD;  Location: [facility] ENDOSCOPY;  Service: Endoscopy;  Laterality: N/A;  . ESOPHAGOGASTRODUODENOSCOPY (EGD) WITH PROPOFOL N/A 04/02/2017   Procedure: ESOPHAGOGASTRODUODENOSCOPY (EGD) WITH PROPOFOL;  Surgeon: Jonathon Bellows, MD;  Location: [facility] ENDOSCOPY;  Service: Endoscopy;  Laterality: N/A;    Social History   Socioeconomic History  . Marital status: Married    Spouse name: Not on file  . Number of children: Not on file  . Years of education: Not on file  . Highest education level: Not on file  Occupational History  . Not on file  Tobacco Use  . Smoking status: Never Smoker  . Smokeless tobacco: Never Used  Substance and Sexual Activity  . Alcohol use: No  . Drug use: No  . Sexual activity: Not on file  Other Topics Concern  . Not on file  Social History Narrative  . Not on file   Social Determinants of Health   Financial Resource Strain:   . Difficulty of Paying Living Expenses:   Food Insecurity:   . Worried About Charity fundraiser in the Last Year:   . Arboriculturist in the Last Year:   Transportation Needs:   . Film/video editor (Medical):   Marland Kitchen Lack of Transportation (Non-Medical):   Physical Activity:   . Days of Exercise per Week:   . Minutes of Exercise per Session:   Stress:   . Feeling of Stress :   Social Connections:   . Frequency of Communication with Friends and Family:   . Frequency of Social Gatherings with Friends and Family:   . Attends Religious Services:   . Active Member of Clubs or Organizations:     . Attends Archivist Meetings:   Marland Kitchen Marital Status:   Intimate Partner Violence:   . Fear of Current or Ex-Partner:   . Emotionally Abused:   Marland Kitchen Physically Abused:   . Sexually Abused:      Family History  Problem Relation Age of Onset  . Cancer Mother 75       Breast, now bilateral  . Breast cancer Mother 12       right breast ca then left breast ca   . Leukemia Mother      Current Outpatient Medications:  .  cetirizine (ZYRTEC ALLERGY) 10 MG tablet, Take 10 mg by mouth daily., Disp: , Rfl:  .  Cholecalciferol 50 MCG (2000 UT) TABS, Take 2,000 Units by mouth daily., Disp: , Rfl:  .  L-Theanine 200 MG CAPS, Take 200 mg by mouth once a week. Take 1-3 tablets at bedtime, Disp: , Rfl:  .  naproxen sodium (ALEVE) 220 MG tablet, Take by  mouth., Disp: , Rfl:    Physical exam:  Vitals:   03/07/20 1107  BP: 140/89  Pulse: 83  Resp: 20  Temp: (!) 97 F (36.1 C)  SpO2: 99%  Weight: 201 lb 6.4 oz (91.4 kg)   Physical Exam HENT:     Head: Normocephalic and atraumatic.  Eyes:     Pupils: Pupils are equal, round, and reactive to light.  Cardiovascular:     Rate and Rhythm: Normal rate and regular rhythm.     Heart sounds: Normal heart sounds.  Pulmonary:     Effort: Pulmonary effort is normal.     Breath sounds: Normal breath sounds.  Abdominal:     General: Bowel sounds are normal.     Palpations: Abdomen is soft.  Musculoskeletal:     Cervical back: Normal range of motion.  Skin:    General: Skin is warm and dry.  Neurological:     Mental Status: She is alert and oriented to person, place, and time.     Breast exam performed in sitting and lying down position.  No palpable bilateral axillary adenopathy.  No palpable breast masses.  There is some induration and hematoma seen at the site of biopsy.  No nipple changes   CMP Latest Ref Rng & Units 10/13/2012  Glucose 65 - 99 mg/dL 80  BUN 7 - 18 mg/dL 9  Creatinine 0.60 - 1.30 mg/dL 0.64  Sodium 136 - 145  mmol/L 140  Potassium 3.5 - 5.1 mmol/L 4.0  Chloride 98 - 107 mmol/L 108(H)  CO2 21 - 32 mmol/L 27  Calcium 8.5 - 10.1 mg/dL 9.2  Total Protein 6.4 - 8.2 g/dL 7.5  Total Bilirubin 0.2 - 1.0 mg/dL 0.4  Alkaline Phos 50 - 136 Unit/L 89  AST 15 - 37 Unit/L 25  ALT 12 - 78 U/L 24   CBC Latest Ref Rng & Units 10/13/2012  WBC 3.6 - 11.0 x10 3/mm 3 5.2  Hemoglobin 12.0 - 16.0 g/dL 13.0  Hematocrit 35.0 - 47.0 % 37.6  Platelets 150 - 440 x10 3/mm 3 203    No images are attached to the encounter.  US BREAST LTD UNI LEFT INC AXILLA  Result Date: 02/22/2020 CLINICAL DATA:  59 year old female with history of right breast cancer in 2005 status post lumpectomy. Patient presents as a recall from screening for bilateral breast masses. EXAM: DIGITAL DIAGNOSTIC BILATERAL MAMMOGRAM WITH TOMO ULTRASOUND BILATERAL BREAST COMPARISON:  Previous exam(s). ACR Breast Density Category b: There are scattered areas of fibroglandular density. FINDINGS: Mammogram: Right breast: Spot compression tomosynthesis views demonstrate persistence of a round mass in the upper inner right breast measuring approximately 0.6 cm. Left breast: Spot compression and full field mL views demonstrate persistence of a spiculated mass measuring 0.4 cm in the outer left breast. Ultrasound: Targeted ultrasound is performed in the right breast at 2 o'clock 5 cm from the nipple demonstrating a round hypoechoic mass with indistinct margins measuring 0.6 x 0.5 x 0.5 cm. No internal blood flow identified. Targeted ultrasound is performed in in the right axilla demonstrating a lymph node with thickened cortex measuring 0.6 cm. Targeted ultrasound is performed in the left breast at 3 o'clock 6 cm from the nipple demonstrating an irregular hypoechoic mass with indistinct margins measuring 0.5 x 0.5 x 0.4 cm. Targeted ultrasound of the left axilla demonstrates normal-appearing lymph nodes. IMPRESSION: 1. Right breast mass at 2 o'clock measuring 0.6 cm is  indeterminate. 2.  Abnormal right axillary lymph node with  cortical thickening. 3.  Left breast mass at 3 o'clock measuring 0.5 cm is suspicious. RECOMMENDATION: Ultrasound-guided core needle biopsy x3 of the right breast mass at 2 o'clock, left breast mass at 3 o'clock and of the right axillary lymph node with cortical thickening. This will be scheduled at the patient's earliest convenience. I have discussed the findings and recommendations with the patient. If applicable, a reminder letter will be sent to the patient regarding the next appointment. BI-RADS CATEGORY  4: Suspicious. Electronically Signed   By: Audie Pinto M.D.   On: 02/22/2020 12:43   US BREAST LTD UNI RIGHT INC AXILLA  Result Date: 02/22/2020 CLINICAL DATA:  59 year old female with history of right breast cancer in 2005 status post lumpectomy. Patient presents as a recall from screening for bilateral breast masses. EXAM: DIGITAL DIAGNOSTIC BILATERAL MAMMOGRAM WITH TOMO ULTRASOUND BILATERAL BREAST COMPARISON:  Previous exam(s). ACR Breast Density Category b: There are scattered areas of fibroglandular density. FINDINGS: Mammogram: Right breast: Spot compression tomosynthesis views demonstrate persistence of a round mass in the upper inner right breast measuring approximately 0.6 cm. Left breast: Spot compression and full field mL views demonstrate persistence of a spiculated mass measuring 0.4 cm in the outer left breast. Ultrasound: Targeted ultrasound is performed in the right breast at 2 o'clock 5 cm from the nipple demonstrating a round hypoechoic mass with indistinct margins measuring 0.6 x 0.5 x 0.5 cm. No internal blood flow identified. Targeted ultrasound is performed in in the right axilla demonstrating a lymph node with thickened cortex measuring 0.6 cm. Targeted ultrasound is performed in the left breast at 3 o'clock 6 cm from the nipple demonstrating an irregular hypoechoic mass with indistinct margins measuring 0.5 x 0.5 x 0.4  cm. Targeted ultrasound of the left axilla demonstrates normal-appearing lymph nodes. IMPRESSION: 1. Right breast mass at 2 o'clock measuring 0.6 cm is indeterminate. 2.  Abnormal right axillary lymph node with cortical thickening. 3.  Left breast mass at 3 o'clock measuring 0.5 cm is suspicious. RECOMMENDATION: Ultrasound-guided core needle biopsy x3 of the right breast mass at 2 o'clock, left breast mass at 3 o'clock and of the right axillary lymph node with cortical thickening. This will be scheduled at the patient's earliest convenience. I have discussed the findings and recommendations with the patient. If applicable, a reminder letter will be sent to the patient regarding the next appointment. BI-RADS CATEGORY  4: Suspicious. Electronically Signed   By: Audie Pinto M.D.   On: 02/22/2020 12:43   MM DIAG BREAST TOMO BILATERAL  Result Date: 02/22/2020 CLINICAL DATA:  59 year old female with history of right breast cancer in 2005 status post lumpectomy. Patient presents as a recall from screening for bilateral breast masses. EXAM: DIGITAL DIAGNOSTIC BILATERAL MAMMOGRAM WITH TOMO ULTRASOUND BILATERAL BREAST COMPARISON:  Previous exam(s). ACR Breast Density Category b: There are scattered areas of fibroglandular density. FINDINGS: Mammogram: Right breast: Spot compression tomosynthesis views demonstrate persistence of a round mass in the upper inner right breast measuring approximately 0.6 cm. Left breast: Spot compression and full field mL views demonstrate persistence of a spiculated mass measuring 0.4 cm in the outer left breast. Ultrasound: Targeted ultrasound is performed in the right breast at 2 o'clock 5 cm from the nipple demonstrating a round hypoechoic mass with indistinct margins measuring 0.6 x 0.5 x 0.5 cm. No internal blood flow identified. Targeted ultrasound is performed in in the right axilla demonstrating a lymph node with thickened cortex measuring 0.6 cm. Targeted ultrasound is  performed  in the left breast at 3 o'clock 6 cm from the nipple demonstrating an irregular hypoechoic mass with indistinct margins measuring 0.5 x 0.5 x 0.4 cm. Targeted ultrasound of the left axilla demonstrates normal-appearing lymph nodes. IMPRESSION: 1. Right breast mass at 2 o'clock measuring 0.6 cm is indeterminate. 2.  Abnormal right axillary lymph node with cortical thickening. 3.  Left breast mass at 3 o'clock measuring 0.5 cm is suspicious. RECOMMENDATION: Ultrasound-guided core needle biopsy x3 of the right breast mass at 2 o'clock, left breast mass at 3 o'clock and of the right axillary lymph node with cortical thickening. This will be scheduled at the patient's earliest convenience. I have discussed the findings and recommendations with the patient. If applicable, a reminder letter will be sent to the patient regarding the next appointment. BI-RADS CATEGORY  4: Suspicious. Electronically Signed   By: Audie Pinto M.D.   On: 02/22/2020 12:43   MM 3D SCREEN BREAST BILATERAL  Result Date: 02/12/2020 CLINICAL DATA:  Screening. EXAM: DIGITAL SCREENING BILATERAL MAMMOGRAM WITH TOMO AND CAD COMPARISON:  Previous exam(s). ACR Breast Density Category b: There are scattered areas of fibroglandular density. FINDINGS: In the right breast a possible mass requires further evaluation. In the left breast possible requires further evaluation. Images were processed with CAD. IMPRESSION: Further evaluation is suggested for a possible mass in the right breast. Further evaluation is suggested for a possible mass in the left breast. RECOMMENDATION: Diagnostic mammogram and possibly ultrasound of both breasts. (Code:[id number]) The patient will be contacted regarding the findings, and additional imaging will be scheduled. BI-RADS CATEGORY  0: Incomplete. Need additional imaging evaluation and/or prior mammograms for comparison. Electronically Signed   By: Ammie Ferrier M.D.   On: 02/12/2020 10:53   MM CLIP  PLACEMENT LEFT  Result Date: 03/01/2020 CLINICAL DATA:  Patient is post ultrasound-guided core needle biopsy of a 5 mm indeterminate mass over the 3 o'clock position of the left breast as well as ultrasound-guided core needle biopsy of a 5 mm right breast mass at the 2 o'clock position in addition to ultrasound core biopsy of a anteromedial right axillary/subpectoral lymph node. No clip was placed in the lymph node. EXAM: DIAGNOSTIC bilateral MAMMOGRAM POST ultrasound BIOPSY COMPARISON:  Previous exam(s). FINDINGS: Mammographic images were obtained following ultrasound guided biopsy of the targeted mass over the 3 o'clock position of the left breast. Images were also obtained after ultrasound-guided biopsy of the targeted mass over the 2 o'clock position of the right breast and biopsy of the right axillary/subpectoral lymph node. Note that no clip was placed in the right axillary/subpectoral lymph node. The biopsy marking clips are in the expected location bilaterally. IMPRESSION: Appropriate positioning of the coil shaped biopsy marking clip at the site of biopsy in the outer midportion of the left breast. Appropriate positioning of the heart shaped biopsy marking clip at the site of biopsy in the inner upper quadrant of the right breast. Final Assessment: Post Procedure Mammograms for Marker Placement Electronically Signed   By: Marin Olp M.D.   On: 03/01/2020 10:34   MM CLIP PLACEMENT RIGHT  Result Date: 03/01/2020 CLINICAL DATA:  Patient is post ultrasound-guided core needle biopsy of a 5 mm indeterminate mass over the 3 o'clock position of the left breast as well as ultrasound-guided core needle biopsy of a 5 mm right breast mass at the 2 o'clock position in addition to ultrasound core biopsy of a anteromedial right axillary/subpectoral lymph node. No clip was placed in the lymph node. EXAM:  DIAGNOSTIC bilateral MAMMOGRAM POST ultrasound BIOPSY COMPARISON:  Previous exam(s). FINDINGS: Mammographic  images were obtained following ultrasound guided biopsy of the targeted mass over the 3 o'clock position of the left breast. Images were also obtained after ultrasound-guided biopsy of the targeted mass over the 2 o'clock position of the right breast and biopsy of the right axillary/subpectoral lymph node. Note that no clip was placed in the right axillary/subpectoral lymph node. The biopsy marking clips are in the expected location bilaterally. IMPRESSION: Appropriate positioning of the coil shaped biopsy marking clip at the site of biopsy in the outer midportion of the left breast. Appropriate positioning of the heart shaped biopsy marking clip at the site of biopsy in the inner upper quadrant of the right breast. Final Assessment: Post Procedure Mammograms for Marker Placement Electronically Signed   By: Marin Olp M.D.   On: 03/01/2020 10:34   [place] LT BREAST BX W LOC DEV 1ST LESION IMG BX SPEC US GUIDE  Addendum Date: 03/02/2020   ADDENDUM REPORT: 03/02/2020 15:20 ADDENDUM: PATHOLOGY revealed: A. RIGHT BREAST, 2:00 5CMFN; ULTRASOUND-GUIDED BIOPSY: - INVASIVE MAMMARY CARCINOMA, NO SPECIAL TYPE. 6 mm in this sample. Grade 2. Ductal carcinoma in situ: Not identified.Lymphovascular invasion: Not identified B. LYMPH NODE, RIGHT AXILLA; ULTRASOUND-GUIDED NEEDLE CORE BIOPSY: - BENIGN LYMPH NODE. - NEGATIVE FOR MALIGNANCY. C. LEFT BREAST, 3:00 6CMFN; ULTRASOUND-GUIDED BIOPSY: - INVASIVE MAMMARY CARCINOMA, WITH LOBULAR FEATURES. 3 mm in this sample. Grade 2. Ductal carcinoma in situ: Not identified. Lymphovascular invasion: Not identified. Pathology results are CONCORDANT with imaging findings, per Dr. Marin Olp. Pathology results and recommendations below were discussed with patient by telephone on 03/02/2020. Patient reported biopsy site doing well with slight tenderness at the site. Post biopsy care instructions were reviewed and questions were answered. Patient was instructed to call [facility] if  any concerns or questions arise related to the biopsy. Recommendation: Surgical referral and MRI due to lobular features. Request for surgical referral was relayed to [place] and Tanya Nones RN at [facility] by Electa Sniff RN on 03/02/2020. Patient requested Dr. Alphonsa Overall at [facility] for her surgical consultation. Addendum by Electa Sniff RN on 03/02/2020. Electronically Signed   By: Marin Olp M.D.   On: 03/02/2020 15:20   Result Date: 03/02/2020 CLINICAL DATA:  Patient presents for ultrasound-guided core needle biopsy of a 5 mm irregular mass over the 3 o'clock position of the left breast 6 cm from the nipple. EXAM: ULTRASOUND GUIDED LEFT BREAST CORE NEEDLE BIOPSY COMPARISON:  Previous exam(s). PROCEDURE: I met with the patient and we discussed the procedure of ultrasound-guided biopsy, including benefits and alternatives. We discussed the high likelihood of a successful procedure. We discussed the risks of the procedure, including infection, bleeding, tissue injury, clip migration, and inadequate sampling. Informed written consent was given. The usual time-out protocol was performed immediately prior to the procedure. Lesion quadrant: Left upper outer quadrant (2:30-3 o'clock position). Using sterile technique and 1% Lidocaine as local anesthetic, under direct ultrasound visualization, a 14 gauge spring-loaded device was used to perform biopsy of the targeted mass at the 3 o'clock position using a inferior to superior approach. Three core tissue specimens were obtained. At the conclusion of the procedure coil shaped tissue marker clip was deployed into the biopsy cavity. Follow up 2 view mammogram was performed and dictated separately. IMPRESSION: Ultrasound guided biopsy of a 5 mm indeterminate left breast mass. No apparent complications. Electronically Signed: By: Marni Griffon.D.  On: 03/01/2020 10:29   [place] RT BREAST BX W LOC DEV 1ST LESION IMG BX SPEC US  GUIDE  Addendum Date: 03/02/2020   ADDENDUM REPORT: 03/02/2020 15:21 ADDENDUM: PATHOLOGY revealed: A. RIGHT BREAST, 2:00 5CMFN; ULTRASOUND-GUIDED BIOPSY: - INVASIVE MAMMARY CARCINOMA, NO SPECIAL TYPE. 6 mm in this sample. Grade 2. Ductal carcinoma in situ: Not identified.Lymphovascular invasion: Not identified B. LYMPH NODE, RIGHT AXILLA; ULTRASOUND-GUIDED NEEDLE CORE BIOPSY: - BENIGN LYMPH NODE. - NEGATIVE FOR MALIGNANCY. C. LEFT BREAST, 3:00 6CMFN; ULTRASOUND-GUIDED BIOPSY: - INVASIVE MAMMARY CARCINOMA, WITH LOBULAR FEATURES. 3 mm in this sample. Grade 2. Ductal carcinoma in situ: Not identified. Lymphovascular invasion: Not identified. Pathology results are CONCORDANT with imaging findings, per Dr. Marin Olp. Pathology results and recommendations below were discussed with patient by telephone on 03/02/2020. Patient reported biopsy site doing well with slight tenderness at the site. Post biopsy care instructions were reviewed and questions were answered. Patient was instructed to call [facility] if any concerns or questions arise related to the biopsy. Recommendation: Surgical referral and MRI due to lobular features. Request for surgical referral was relayed to [place] and Tanya Nones RN at [facility] by Electa Sniff RN on 03/02/2020. Patient requested Dr. Alphonsa Overall at [facility] for her surgical consultation. Addendum by Electa Sniff RN on 03/02/2020. Electronically Signed   By: Marin Olp M.D.   On: 03/02/2020 15:21   Result Date: 03/02/2020 CLINICAL DATA:  Patient presents for ultrasound-guided core needle biopsy of a 5 mm oval circumscribed mass over the 2 o'clock position of the right breast 5 cm from the nipple as well as an abnormal right axillary/subpectoral lymph node. EXAM: ULTRASOUND GUIDED RIGHT BREAST CORE NEEDLE BIOPSY COMPARISON:  Previous exam(s). PROCEDURE: I met with the patient and we discussed the procedure of ultrasound-guided biopsy,  including benefits and alternatives. We discussed the high likelihood of a successful procedure. We discussed the risks of the procedure, including infection, bleeding, tissue injury, clip migration, and inadequate sampling. Informed written consent was given. The usual time-out protocol was performed immediately prior to the procedure. 1.  Lesion quadrant: Right upper inner quadrant. Using sterile technique and 1% Lidocaine as local anesthetic, under direct ultrasound visualization, a 14 gauge spring-loaded device was used to perform biopsy of the targeted mass at the 2 o'clock position of the right breast using a inferior to superior approach. Three core tissue specimens were obtained. At the conclusion of the procedure heart shaped tissue marker clip was deployed into the biopsy cavity. Follow up 2 view mammogram was performed and dictated separately. 2.  Lesion quadrant: Anterior right axilla/subpectoral. Using sterile technique and 1% Lidocaine as local anesthetic, under direct ultrasound visualization, a 14 gauge spring-loaded device was used to perform biopsy of the targeted lymph node in the anterior right axilla/subpectoral region using a medial to lateral approach. A single specimen was obtained as no further specimens were obtain due to the difficult access to this node with artery and vein in close proximity. No HydroMARK clip was placed due to the close proximity of the lymph node 2 adjacent artery and vein. Small hematoma was noted in the subpectoral biopsy site as extra direct pressure was applied post biopsy. IMPRESSION: Ultrasound guided biopsy of an indeterminate 5 mm mass over the 2 o'clock position of the right breast as well as abnormal right axillary/subpectoral lymph node. Electronically Signed: By: Marin Olp M.D. On: 03/01/2020 10:47   [place] RT BREAST BX W LOC DEV EA  ADD LESION IMG BX SPEC US GUIDE  Addendum Date: 03/02/2020   ADDENDUM REPORT: 03/02/2020 15:21 ADDENDUM: PATHOLOGY  revealed: A. RIGHT BREAST, 2:00 5CMFN; ULTRASOUND-GUIDED BIOPSY: - INVASIVE MAMMARY CARCINOMA, NO SPECIAL TYPE. 6 mm in this sample. Grade 2. Ductal carcinoma in situ: Not identified.Lymphovascular invasion: Not identified B. LYMPH NODE, RIGHT AXILLA; ULTRASOUND-GUIDED NEEDLE CORE BIOPSY: - BENIGN LYMPH NODE. - NEGATIVE FOR MALIGNANCY. C. LEFT BREAST, 3:00 6CMFN; ULTRASOUND-GUIDED BIOPSY: - INVASIVE MAMMARY CARCINOMA, WITH LOBULAR FEATURES. 3 mm in this sample. Grade 2. Ductal carcinoma in situ: Not identified. Lymphovascular invasion: Not identified. Pathology results are CONCORDANT with imaging findings, per Dr. Marin Olp. Pathology results and recommendations below were discussed with patient by telephone on 03/02/2020. Patient reported biopsy site doing well with slight tenderness at the site. Post biopsy care instructions were reviewed and questions were answered. Patient was instructed to call [facility] if any concerns or questions arise related to the biopsy. Recommendation: Surgical referral and MRI due to lobular features. Request for surgical referral was relayed to [place] and Tanya Nones RN at [facility] by Electa Sniff RN on 03/02/2020. Patient requested Dr. Alphonsa Overall at [facility] for her surgical consultation. Addendum by Electa Sniff RN on 03/02/2020. Electronically Signed   By: Marin Olp M.D.   On: 03/02/2020 15:21   Result Date: 03/02/2020 CLINICAL DATA:  Patient presents for ultrasound-guided core needle biopsy of a 5 mm oval circumscribed mass over the 2 o'clock position of the right breast 5 cm from the nipple as well as an abnormal right axillary/subpectoral lymph node. EXAM: ULTRASOUND GUIDED RIGHT BREAST CORE NEEDLE BIOPSY COMPARISON:  Previous exam(s). PROCEDURE: I met with the patient and we discussed the procedure of ultrasound-guided biopsy, including benefits and alternatives. We discussed the high likelihood of a successful  procedure. We discussed the risks of the procedure, including infection, bleeding, tissue injury, clip migration, and inadequate sampling. Informed written consent was given. The usual time-out protocol was performed immediately prior to the procedure. 1.  Lesion quadrant: Right upper inner quadrant. Using sterile technique and 1% Lidocaine as local anesthetic, under direct ultrasound visualization, a 14 gauge spring-loaded device was used to perform biopsy of the targeted mass at the 2 o'clock position of the right breast using a inferior to superior approach. Three core tissue specimens were obtained. At the conclusion of the procedure heart shaped tissue marker clip was deployed into the biopsy cavity. Follow up 2 view mammogram was performed and dictated separately. 2.  Lesion quadrant: Anterior right axilla/subpectoral. Using sterile technique and 1% Lidocaine as local anesthetic, under direct ultrasound visualization, a 14 gauge spring-loaded device was used to perform biopsy of the targeted lymph node in the anterior right axilla/subpectoral region using a medial to lateral approach. A single specimen was obtained as no further specimens were obtain due to the difficult access to this node with artery and vein in close proximity. No HydroMARK clip was placed due to the close proximity of the lymph node 2 adjacent artery and vein. Small hematoma was noted in the subpectoral biopsy site as extra direct pressure was applied post biopsy. IMPRESSION: Ultrasound guided biopsy of an indeterminate 5 mm mass over the 2 o'clock position of the right breast as well as abnormal right axillary/subpectoral lymph node. Electronically Signed: By: Marin Olp M.D. On: 03/01/2020 10:47    Assessment and plan- Patient is a 59 y.o. female with newly diagnosed bilateral stage I breast cancer  1.  With regards to her right breast cancer: Right axillary lymph node biopsy was negative for malignancy.  She has had prior right  breast cancer in 2005 and has received radiation treatment.  It is therefore unlikely that she can receive more radiation to the same side.  However I will refer the patient to Dr. Donella Stade to discuss this further prior to surgery.  If patient cannot receive radiation again to her right breast she would need a mastectomy and sentinel lymph node biopsy which can be challenging in the setting of prior surgery  2.  With regards to her left breast cancer patient will be going through a lumpectomy and sentinel lymph node biopsy.  Patient has already met with Dr. Lucia Gaskins from [place] [facility] and surgical plans are pending based on radiation oncology opinion  3.  At the time of my visit ER/PR and HER-2 status was pending.  Regardless patient has a small breast mass less than 1 cm in size and therefore I recommend upfront surgery unless there is evidence of more disease at the time of MRI..  I will see her back after final pathology is back and discuss further management.  If she has ER/PR positive and HER-2 negative breast cancer, I would favor sending Oncotype testing on both of breast specimens to determine if she would benefit from chemotherapy.  Discussed what Oncotype testing is and how the results are interpreted.  Given her age if her score is less than 11 low risk or 11-25 intermediate risk she would not benefit from adjuvant chemotherapy.  Adjuvant chemotherapy would be indicated if Oncotype score is more than 25.  Also if she has HER-2 positive breast cancer triple negative breast cancer that would warrant a discussion about adjuvant chemotherapy.  Treatment will be given with a curative intent.  Patient verbalized understanding of the plan  4.  We will also refer patient to genetic testing given bilateral and recurrent breast cancer.   Thank you for this kind referral and the opportunity to participate in the care of this patient   Visit Diagnosis 1. Bilateral malignant neoplasm of breast in female,  unspecified estrogen receptor status, unspecified site of breast ([place])   2. Goals of care, counseling/discussion     Dr. Randa Evens, MD, MPH [facility] [phone] 03/10/2020  8:18 AM

## 2020-03-14 ENCOUNTER — Other Ambulatory Visit: Payer: Self-pay

## 2020-03-14 LAB — SURGICAL PATHOLOGY

## 2020-03-15 ENCOUNTER — Ambulatory Visit
Admission: RE | Admit: 2020-03-15 | Discharge: 2020-03-15 | Disposition: A | Discharge status: Home / Self Care | Payer: 59 | Source: Ambulatory Visit | Attending: Radiation Oncology | Admitting: Radiation Oncology

## 2020-03-15 ENCOUNTER — Encounter: Payer: Self-pay | Admitting: Radiation Oncology

## 2020-03-15 VITALS — BP 131/88 | HR 101 | Wt 201.5 lb

## 2020-03-15 DIAGNOSIS — Z9221 Personal history of antineoplastic chemotherapy: Secondary | ICD-10-CM | POA: Diagnosis not present

## 2020-03-15 DIAGNOSIS — J45909 Unspecified asthma, uncomplicated: Secondary | ICD-10-CM | POA: Diagnosis not present

## 2020-03-15 DIAGNOSIS — M810 Age-related osteoporosis without current pathological fracture: Secondary | ICD-10-CM | POA: Diagnosis not present

## 2020-03-15 DIAGNOSIS — Z806 Family history of leukemia: Secondary | ICD-10-CM | POA: Insufficient documentation

## 2020-03-15 DIAGNOSIS — D0511 Intraductal carcinoma in situ of right breast: Secondary | ICD-10-CM | POA: Insufficient documentation

## 2020-03-15 DIAGNOSIS — Z803 Family history of malignant neoplasm of breast: Secondary | ICD-10-CM | POA: Diagnosis not present

## 2020-03-15 DIAGNOSIS — Z923 Personal history of irradiation: Secondary | ICD-10-CM | POA: Insufficient documentation

## 2020-03-15 DIAGNOSIS — Z170 Estrogen receptor positive status [ER+]: Secondary | ICD-10-CM | POA: Insufficient documentation

## 2020-03-15 DIAGNOSIS — C50911 Malignant neoplasm of unspecified site of right female breast: Secondary | ICD-10-CM

## 2020-03-15 DIAGNOSIS — D0512 Intraductal carcinoma in situ of left breast: Secondary | ICD-10-CM | POA: Insufficient documentation

## 2020-03-15 HISTORY — DX: Other specified disorders of bone density and structure, unspecified site: M85.80

## 2020-03-15 NOTE — Consult Note (Signed)
NEW PATIENT EVALUATION  Name: Marissa Phillips  MRN: [id number]  Date:   03/15/2020     DOB: 1961/10/15   This 59 y.o. female patient presents to the clinic for initial evaluation of bilateral breast cancer in patient who is previously radiated in 2005 to her right whole breast.  REFERRING PHYSICIAN: Idelle Crouch, MD  CHIEF COMPLAINT:  Chief Complaint  Patient presents with  . Breast Cancer    Initial consultation    DIAGNOSIS: The encounter diagnosis was Ductal carcinoma of both breasts ([place]).   PREVIOUS INVESTIGATIONS:  Mammogram and ultrasound reviewed MRI scan planned for tomorrow Clinical notes reviewed Surgical pathology report reviewed  HPI: Patient is a 59 year old female treated back in 2006 to her right whole breast with external beam radiation therapy status post lumpectomy and sentinel node biopsy for invasive mammary carcinoma.  She had done well although recently presented with abnormal mammogram of bilateral breasts.  Right breast showed a 0.6 5.5 x 0.5 cm mass in the right breast at the 2 o'clock position.  She also an abnormal right axillary lymph node with cortical thickening.  Left breast has a 0.5 cm mass in the left breast suspicious for malignancy.  Ultrasound-guided biopsy of both breasts showed a 6 mm area of invasive mammary carcinoma as well as a negative right axillary lymph node left breast confirmed invasive mammary carcinoma with lobular features measuring 3 mm overall grade 2.  Right breast was ER positive PR negative and HER-2/neu not overexpressed.  Left breast also was ER positive PR negative and HER-2/neu negative.  Patient is scheduled for an MRI of bilateral breasts tomorrow.  She is seen today for clinical decision-making regarding what to do with the right breast as well as recommendations for left breast.  She is doing well and is without complaint specifically denies breast tenderness cough or bone pain.  PLANNED TREATMENT REGIMEN: Right breast  mastectomy versus wide local excision lumpectomy with partial breast radiation left breast lumpectomy sentinel node biopsy plus whole breast radiation  PAST MEDICAL HISTORY:  has a past medical history of Asthma, Breast cancer ([place]) (2005), Cancer ([place]), Cataract, Osteoporosis, Personal history of chemotherapy, and Personal history of radiation therapy.    PAST SURGICAL HISTORY:  Past Surgical History:  Procedure Laterality Date  . BREAST BIOPSY Right 2005   +  . BREAST BIOPSY Right 03/01/2020   [place] bx of mass at 2:00 heart marker, path pending  . BREAST BIOPSY Left 03/01/2020   [place] bx mass at 3:00, coil marker, path pending  . BREAST BIOPSY Right 03/01/2020   [place] bx of right axilla, no marker placed, path pending  . BREAST LUMPECTOMY Right 2005  . BREAST LUMPECTOMY WITH NEEDLE LOCALIZATION AND AXILLARY SENTINEL LYMPH NODE BX Right 2005  . BREAST SURGERY    . COLONOSCOPY WITH PROPOFOL N/A 04/02/2017   Procedure: COLONOSCOPY WITH PROPOFOL;  Surgeon: Jonathon Bellows, MD;  Location: [facility] ENDOSCOPY;  Service: Endoscopy;  Laterality: N/A;  . ESOPHAGOGASTRODUODENOSCOPY (EGD) WITH PROPOFOL N/A 04/02/2017   Procedure: ESOPHAGOGASTRODUODENOSCOPY (EGD) WITH PROPOFOL;  Surgeon: Jonathon Bellows, MD;  Location: [facility] ENDOSCOPY;  Service: Endoscopy;  Laterality: N/A;    FAMILY HISTORY: family history includes Breast cancer (age of onset: 22) in her mother; Cancer (age of onset: 65) in her mother; Leukemia in her mother.  SOCIAL HISTORY:  reports that she has never smoked. She has never used smokeless tobacco. She reports that she does not drink alcohol or use drugs.  ALLERGIES: Celebrex [celecoxib], Clindamycin  hcl, Loratadine, Omeprazole, Penicillins, and Sulfa antibiotics  MEDICATIONS:  Current Outpatient Medications  Medication Sig Dispense Refill  . cetirizine (ZYRTEC ALLERGY) 10 MG tablet Take 10 mg by mouth daily.    . Cholecalciferol 50 MCG (2000 UT) TABS Take 2,000 Units by mouth daily.    Marland Kitchen L-Theanine  200 MG CAPS Take 200 mg by mouth once a week. Take 1-3 tablets at bedtime    . naproxen sodium (ALEVE) 220 MG tablet Take by mouth.     No current facility-administered medications for this encounter.    ECOG PERFORMANCE STATUS:  0 - Asymptomatic  REVIEW OF SYSTEMS: Patient denies any weight loss, fatigue, weakness, fever, chills or night sweats. Patient denies any loss of vision, blurred vision. Patient denies any ringing  of the ears or hearing loss. No irregular heartbeat. Patient denies heart murmur or history of fainting. Patient denies any chest pain or pain radiating to her upper extremities. Patient denies any shortness of breath, difficulty breathing at night, cough or hemoptysis. Patient denies any swelling in the lower legs. Patient denies any nausea vomiting, vomiting of blood, or coffee ground material in the vomitus. Patient denies any stomach pain. Patient states has had normal bowel movements no significant constipation or diarrhea. Patient denies any dysuria, hematuria or significant nocturia. Patient denies any problems walking, swelling in the joints or loss of balance. Patient denies any skin changes, loss of hair or loss of weight. Patient denies any excessive worrying or anxiety or significant depression. Patient denies any problems with insomnia. Patient denies excessive thirst, polyuria, polydipsia. Patient denies any swollen glands, patient denies easy bruising or easy bleeding. Patient denies any recent infections, allergies or URI. Patient "s visual fields have not changed significantly in recent time.   PHYSICAL EXAM: BP 131/88 (BP Location: Left Arm, Patient Position: Sitting, Cuff Size: Normal)   Pulse (!) 101   Wt 201 lb 8 oz (91.4 kg)   BMI 31.56 kg/m  Lungs are clear to A&P cardiac examination essentially unremarkable with regular rate and rhythm. No dominant mass or nodularity is noted in either breast in 2 positions examined. Incision is well-healed. No axillary or  supraclavicular adenopathy is appreciated. Cosmetic result is excellent.  Well-developed well-nourished patient in NAD. HEENT reveals PERLA, EOMI, discs not visualized.  Oral cavity is clear. No oral mucosal lesions are identified. Neck is clear without evidence of cervical or supraclavicular adenopathy. Lungs are clear to A&P. Cardiac examination is essentially unremarkable with regular rate and rhythm without murmur rub or thrill. Abdomen is benign with no organomegaly or masses noted. Motor sensory and DTR levels are equal and symmetric in the upper and lower extremities. Cranial nerves II through XII are grossly intact. Proprioception is intact. No peripheral adenopathy or edema is identified. No motor or sensory levels are noted. Crude visual fields are within normal range.  LABORATORY DATA: Pathology report reviewed    RADIOLOGY RESULTS: Mammograms reviewed MRI scan to be reviewed after results from tomorrow   IMPRESSION: Bilateral small stage I breast cancers in them 59 year old female whose history of prior right breast radiation in 2006 both lesions ER positive PR negative HER-2/neu not overexpressed  PLAN: At this time like to review her MRI of her breast.  Should this show no further lesions in either breast I believe the treatment plan would be lumpectomy and sentinel node biopsy left breast followed by whole breast radiation.  Right breast we have the option of mastectomy versus lumpectomy with partial breast radiation.  Partial breast radiation B 3400 cGy in 10 fractions at 340 centigrade twice daily targeting her lumpectomy cavity.  Left breast I would treat in a hypofractionated regimen over 3 weeks time probably boosting her scar another 5 fractions.  Risks and benefits of treatment including skin reaction fatigue alteration of blood count possible inclusion of superficial lung all were described in detail to the patient.  There is a slight increased risk of breast necrosis secondary to a  second course of radiation therapy although with the time span of 16 years excellent condition of her current right breast I believe this is possible and we have done on several occasions in the past with excellent clinical results.  Patient also will benefit from antiestrogen therapy in the future.  Patient and her husband both seem to comprehend her treatment plan well.  I will review her MRI results in the next day or 2 with the patient.  She will then have a discussion on surgical options with Dr. Lucia Gaskins.  I will see the patient postop in about 2 weeks after surgeries.  I would like to take this opportunity to thank you for allowing me to participate in the care of your patient.Noreene Filbert, MD

## 2020-03-16 ENCOUNTER — Encounter: Payer: Self-pay | Admitting: Radiation Oncology

## 2020-03-16 ENCOUNTER — Ambulatory Visit
Admission: RE | Admit: 2020-03-16 | Discharge: 2020-03-16 | Disposition: A | Discharge status: Home / Self Care | Payer: 59 | Source: Ambulatory Visit | Attending: Surgery | Admitting: Surgery

## 2020-03-16 ENCOUNTER — Other Ambulatory Visit: Payer: Self-pay

## 2020-03-16 DIAGNOSIS — Z853 Personal history of malignant neoplasm of breast: Secondary | ICD-10-CM | POA: Insufficient documentation

## 2020-03-16 DIAGNOSIS — C50912 Malignant neoplasm of unspecified site of left female breast: Secondary | ICD-10-CM | POA: Diagnosis not present

## 2020-03-16 DIAGNOSIS — C50911 Malignant neoplasm of unspecified site of right female breast: Secondary | ICD-10-CM | POA: Diagnosis not present

## 2020-03-16 IMAGING — MR MR BREAST BILAT WO/W CM
2 of 9 series · 6 of 48 positions shown · IV contrast (gadavist)
Comparison: Previous exam(s).

CLINICAL DATA: 58-year-old female with history of remote treated
right breast cancer presents with newly diagnosed bilateral breast
cancer.

LABS:  None performed on site.
EXAM:
BILATERAL BREAST MRI WITH AND WITHOUT CONTRAST
TECHNIQUE: Multiplanar, multisequence MR images of both breasts were obtained
prior to and following the intravenous administration of 10 ml of
Gadavist.

[Series 2: T1 · axial · B · 1.5mm · 1.02mm/px · z∈[-62,+105]mm · 5 of 112 slices shown]
[im 1/112]
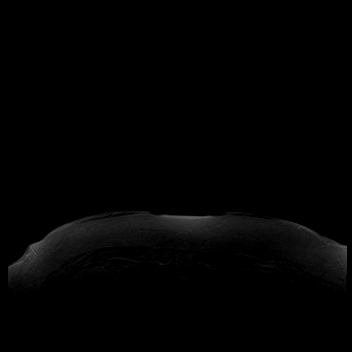
[im 28/112]
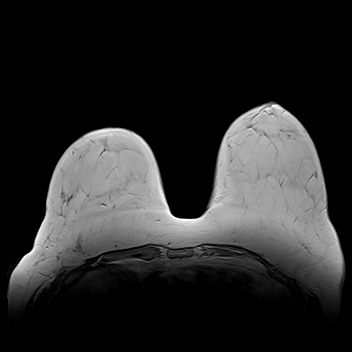
[im 56/112]
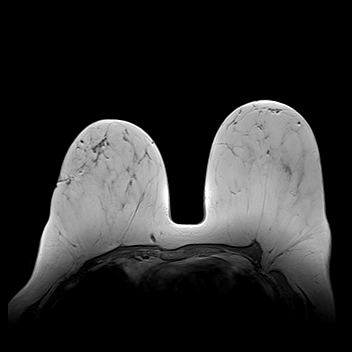
[im 84/112]
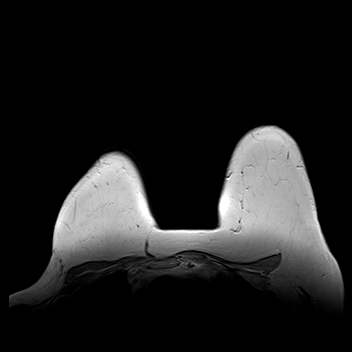
[im 112/112]
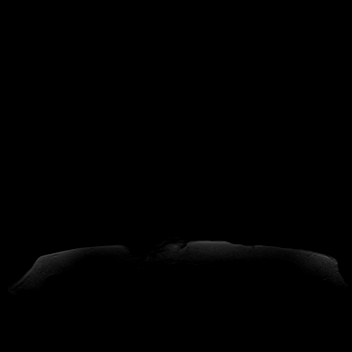

[Series 3: T2 · axial · B · 3.0mm · 1.02mm/px · 1 of 46 slices shown]
[im 1/46]
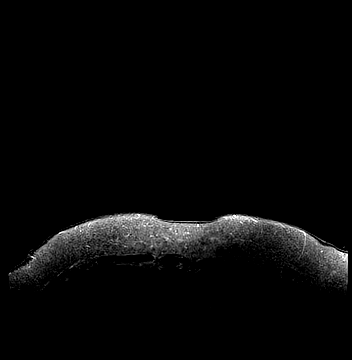

[6 of 48 positions shown; findings below may reference images not displayed]

Three-dimensional MR images were rendered by post-processing of the
original MR data on an independent workstation. The
three-dimensional MR images were interpreted, and findings are
reported in the following complete MRI report for this study. Three
dimensional images were evaluated at the independent [employer]
workstation
FINDINGS: Breast composition: b. Scattered fibroglandular tissue.

Background parenchymal enhancement: Mild.

Right breast: Susceptibility artifact in post biopsy clips are seen
in association with a 6 x 5 x 5 mm enhancing mass in the upper inner
right breast at middle depth (series 10, image 57/112). This is
consistent with the patient's biopsy proven site of right-sided
malignancy.

Postsurgical changes are seen in the upper outer right breast at mid
to anterior depth. Subtle non mass enhancement is seen surrounding
the post biopsy changes (series 10, image 52/112). This demonstrates
color uptake greater than background with progressive and plateau
enhancement kinetics. It spans approximately 5 x 4 cm in axial
dimensions.

No other suspicious enhancement is identified in the remainder of
the right breast.

Left breast: Susceptibility artifact from post biopsy clip is seen
in the lateral left breast at posterior depth (series 10, image
40/112). There is no significant associated masslike or non mass
enhancement. This is consistent with the patient's biopsy-proven
site of left breast malignancy.

A 4-5 mm enhancing focus with associated color uptake is
demonstrated in the superior central left breast at middle depth
(series 10, image 56/112). This demonstrates areas of washout
kinetics.

Lymph nodes: No abnormal appearing lymph nodes.

Ancillary findings:  None.
IMPRESSION: 1. Biopsy proven malignancy in the bilateral breasts.
2. Subtle, indeterminate non-mass enhancement adjacent to the
patient's prior right breast lumpectomy bed in the upper outer
quadrant (series 10, image 52).
3. Indeterminate 4-5 mm enhancing focus in the central left breast
(series 10, image 56).
4. No suspicious lymphadenopathy.

RECOMMENDATION:
1. MRI guided biopsy of an indeterminate left breast enhancing
focus.
2. If breast conservation therapy is a consideration on the right,
additional MRI guided biopsy of subtle non-mass enhancement at the
patient's prior lumpectomy bed is also recommended.

BI-RADS CATEGORY  4: Suspicious.

## 2020-03-16 MED ORDER — GADOBUTROL 1 MMOL/ML IV SOLN
10.0000 mL | Freq: Once | INTRAVENOUS | Status: AC | PRN
  Administered 2020-03-16: 10 mL via INTRAVENOUS

## 2020-03-17 ENCOUNTER — Other Ambulatory Visit: Payer: Self-pay | Admitting: Surgery

## 2020-03-17 ENCOUNTER — Telehealth: Payer: Self-pay | Admitting: *Deleted

## 2020-03-17 DIAGNOSIS — R9389 Abnormal findings on diagnostic imaging of other specified body structures: Secondary | ICD-10-CM

## 2020-03-17 NOTE — Telephone Encounter (Signed)
Patient contacted to assure understanding of education and treatment plan. Patient verbalized understanding and was able to verify future appointment. Patient encouraged to contact Radiation Oncology Department should questions arise. 

## 2020-03-23 ENCOUNTER — Encounter: Payer: 59 | Attending: Internal Medicine | Admitting: Dietician

## 2020-03-23 ENCOUNTER — Other Ambulatory Visit: Payer: Self-pay

## 2020-03-23 DIAGNOSIS — Z713 Dietary counseling and surveillance: Secondary | ICD-10-CM

## 2020-03-23 NOTE — Progress Notes (Signed)
[place] Employee "self referral" nutrition session: Start time: 1530   End time: 1630  Height: 5'6" Weight: 203.3 lbs  Met with employee to discuss his/her nutritional concerns and diet history.   Diet history:  Pt recently stopped eating fried foods.  Pt reports liking corn, lima beans, BEPs, pinto beans, but knows that she does not get enough fruits and vegetables into her diet.    Typical eating pattern: Breakfast: bowl cereal/pack of nabs, veggie omelet with onions, peppers, with cheddar cheese/sausage or bacon with eggs and biscuits Snack: BBQ potato chips/vanilla wafers Lunch: 1 slice meat lover's frozen pizza/hot dogs  Snack: same as above Supper: BBQ chicken or steak/seafood/Firehouse subs/frozen meals  Snack: same as above  Beverages: mostly diet mountain dew, tea with sweet and low, coffee with cream, little water   Education topics covered during this visit:  General nutrition/ Healthy eating  Weight Concerns  Educational resources provided:  Museum/gallery conservator with food lists  General dietary guidelines for healthy eating habits   Plan:  Add one vegetable or fruit per day- carrots, broccoli, spinach, apple, coleslaw  Try adding flavor to your water to drink more water per day   Experiment with new protein options like greek yogurt or low-fat string cheese

## 2020-03-24 ENCOUNTER — Ambulatory Visit
Admission: RE | Admit: 2020-03-24 | Discharge: 2020-03-24 | Disposition: A | Discharge status: Home / Self Care | Payer: 59 | Source: Ambulatory Visit | Attending: Surgery | Admitting: Surgery

## 2020-03-24 ENCOUNTER — Other Ambulatory Visit: Payer: Self-pay

## 2020-03-24 DIAGNOSIS — R9389 Abnormal findings on diagnostic imaging of other specified body structures: Secondary | ICD-10-CM

## 2020-03-24 DIAGNOSIS — N6012 Diffuse cystic mastopathy of left breast: Secondary | ICD-10-CM | POA: Diagnosis not present

## 2020-03-24 DIAGNOSIS — R928 Other abnormal and inconclusive findings on diagnostic imaging of breast: Secondary | ICD-10-CM | POA: Diagnosis not present

## 2020-03-24 DIAGNOSIS — N641 Fat necrosis of breast: Secondary | ICD-10-CM | POA: Diagnosis not present

## 2020-03-24 IMAGING — MG MM BREAST LOCALIZATION CLIP
4 series · 4 of 12 positions shown · non-contrast
Comparison: Previous exams.

CLINICAL DATA: Post MRI guided biopsy of an enhancing focus in the
upper central left breast and MRI guided biopsy of non mass
enhancement adjacent to the lumpectomy site in the upper-outer right
breast.

EXAM:
DIAGNOSTIC BILATERAL MAMMOGRAM POST MRI BIOPSY

[R CC synth-2D]
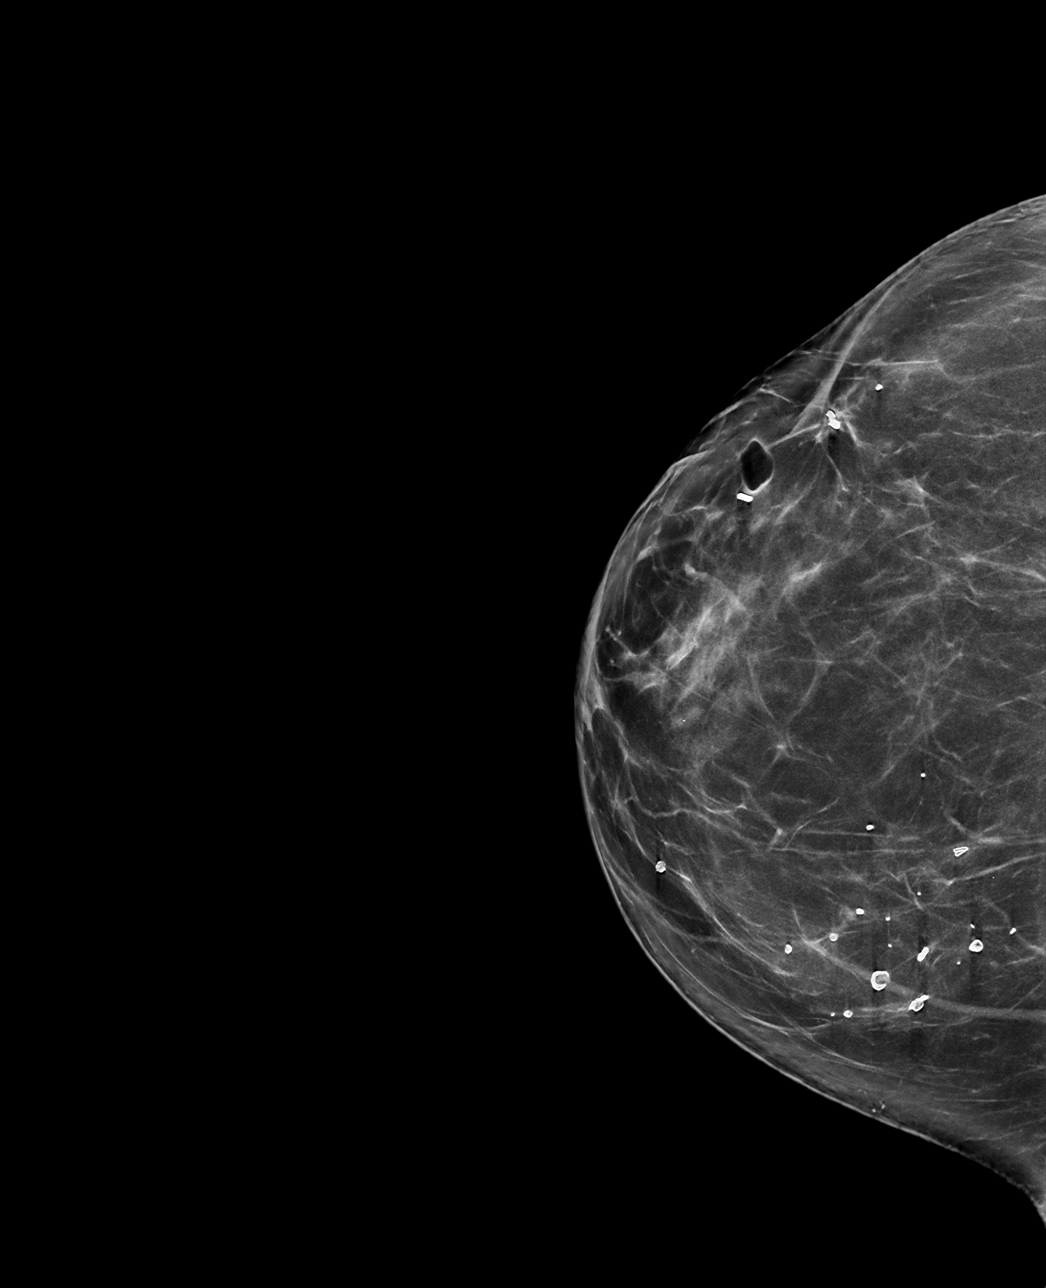

[R ML synth-2D]
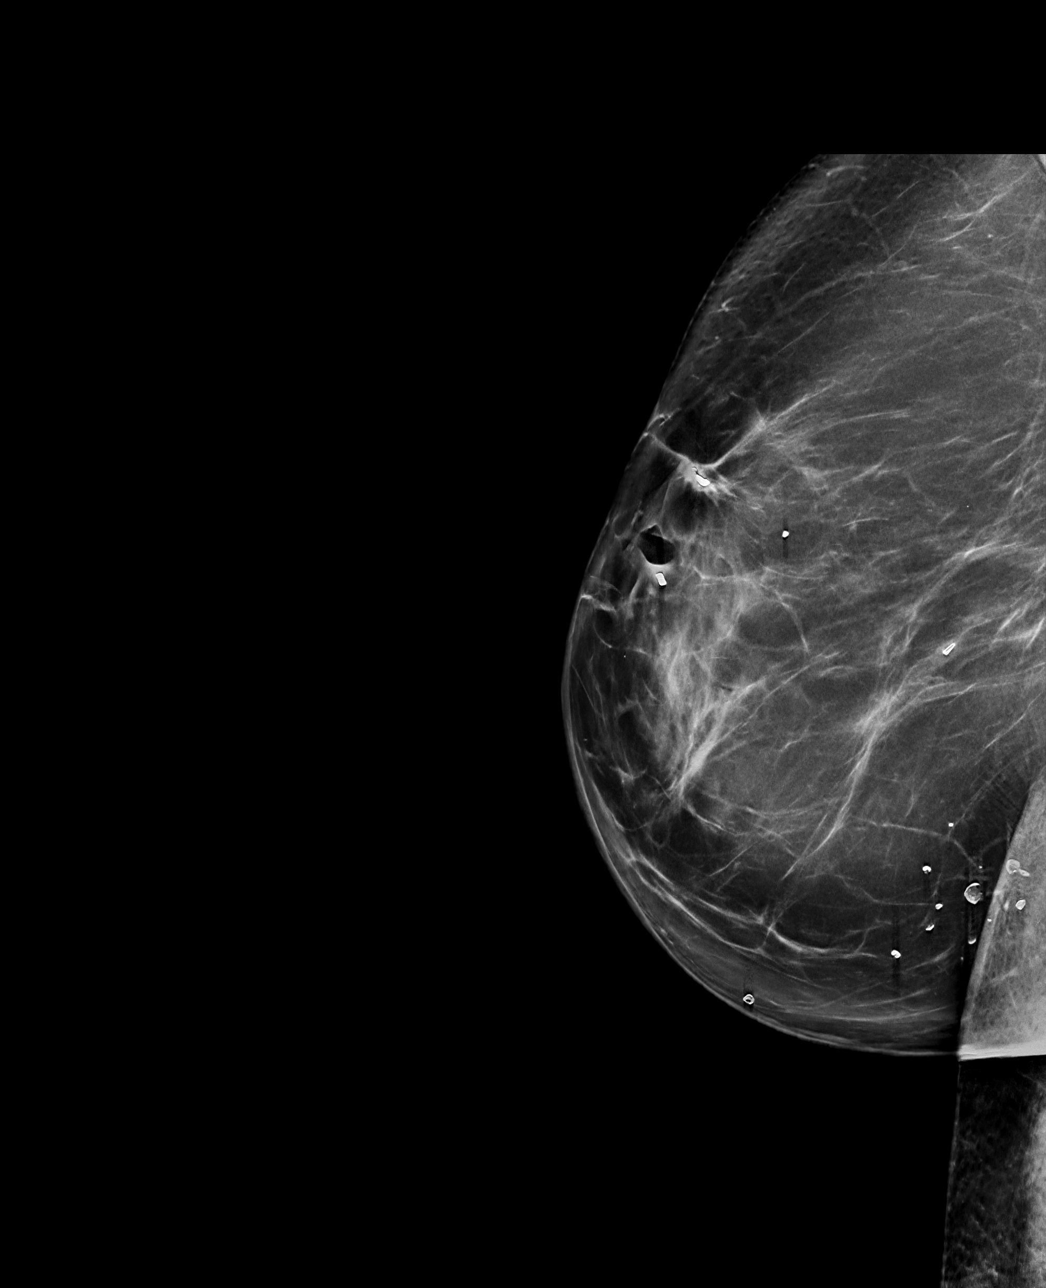

[R CC tomo · tomo slice 39/78.0]
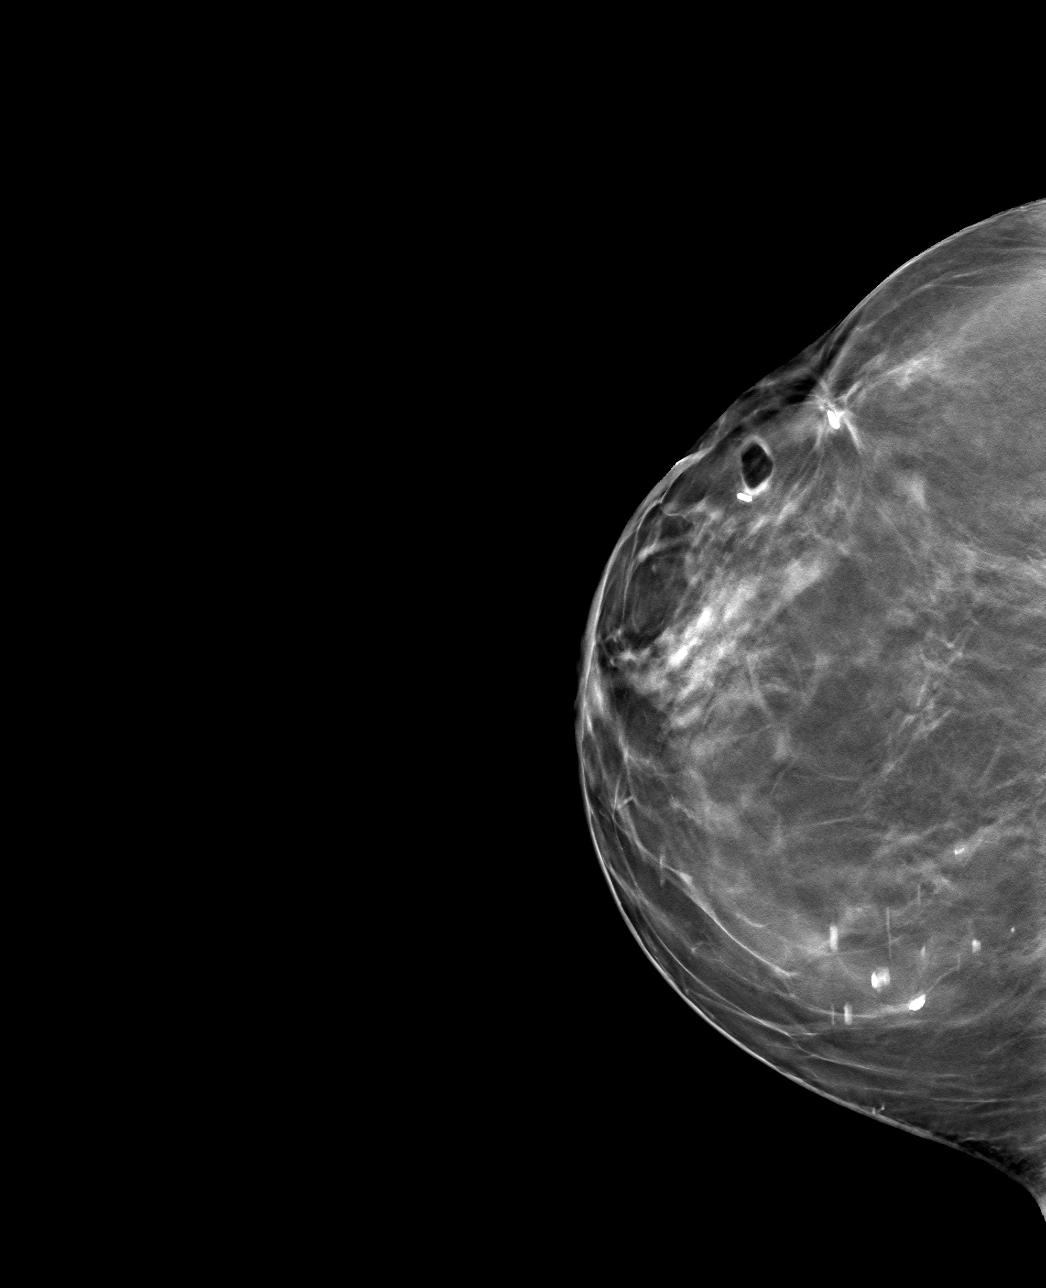

[R ML tomo · tomo slice 47/92.0]
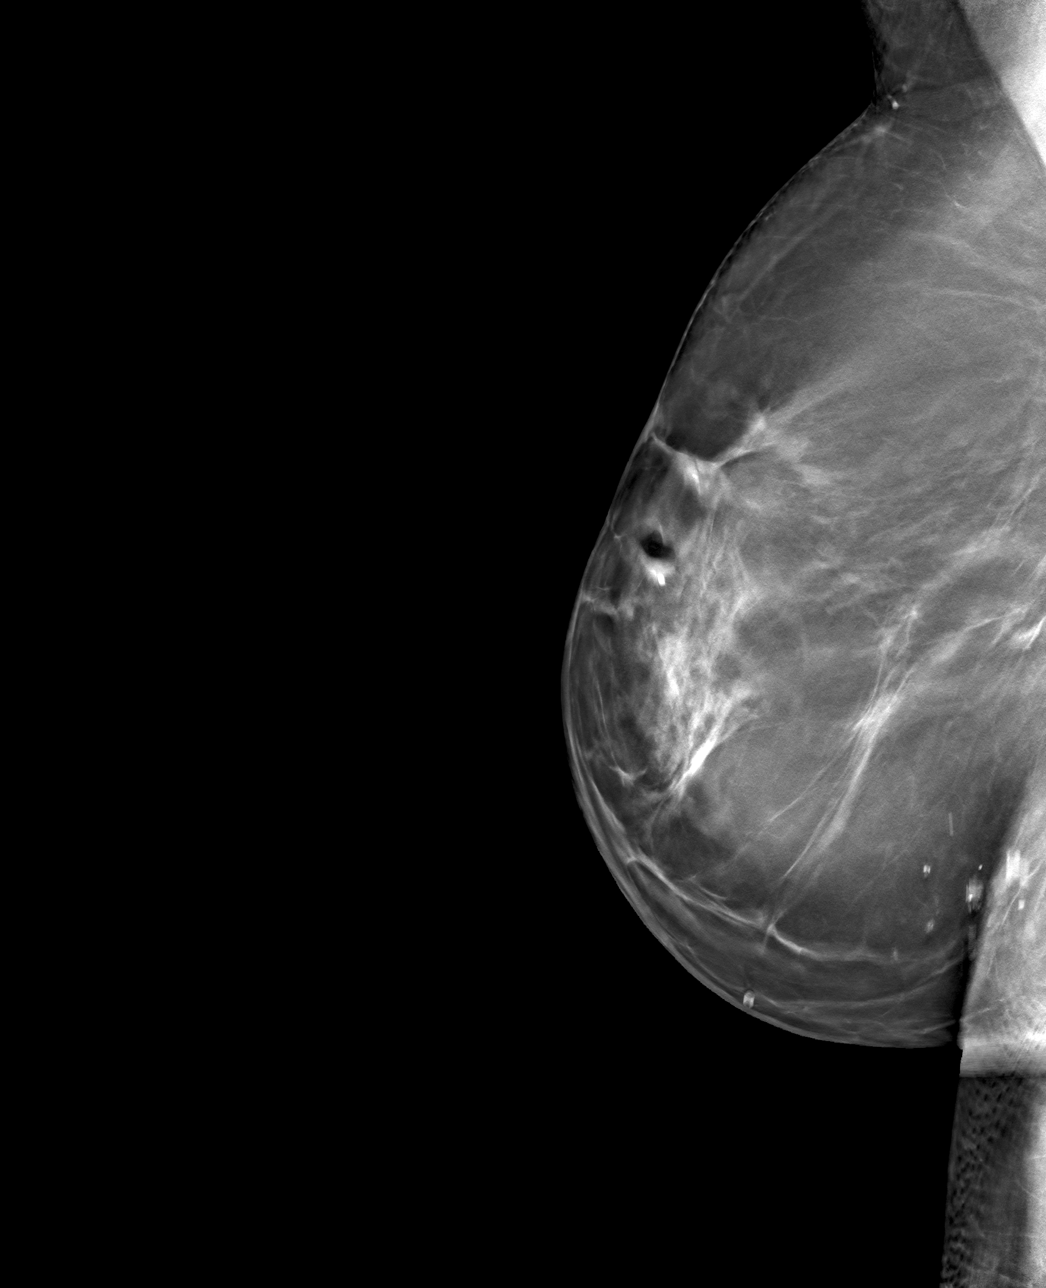

[4 of 12 positions shown; findings below may reference images not displayed]

FINDINGS: Mammographic images were obtained following MRI guided biopsy of an
enhancing focus in the upper central left breast and MRI guided
biopsy of non mass enhancement adjacent to the lumpectomy site in
the upper-outer right breast. A cylindrical shaped biopsy marking
clip is present at the site of the biopsied enhancing focus in the
upper central left breast. A cylindrical shaped biopsy marking clip
is present at the site of the biopsied non mass enhancement in the
region of the lumpectomy site in the upper-outer right breast.
IMPRESSION: 1. Cylindrical shaped biopsy marking clip at site of biopsied
enhancing focus in the upper central left breast.

2. Cylindrical shaped biopsy marking clip at site of biopsied non
mass enhancement in the region of the lumpectomy site in the
upper-outer right breast.

Final Assessment: Post Procedure Mammograms for Marker Placement

## 2020-03-24 IMAGING — MR MR BREAST BX W LOC DEV 1ST LESION IMAGE BX SPEC MR GUIDE*L*
7 of 10 series · 31 of 48 positions shown · IV contrast (gadavist)
Comparison: Previous exams.
COMPARISON: Previous exams.

Addendum:
CLINICAL DATA: 58-year-old female presents for MRI guided biopsy of
non mass enhancement surrounding the right breast lumpectomy site in
the upper-outer right breast and for MRI guided biopsy of enhancing
focus in the upper central left breast.

EXAM:
MRI GUIDED CORE NEEDLE BIOPSY OF THE BILATERAL BREASTS
TECHNIQUE: Multiplanar, multisequence MR imaging of the bilateral breast was
performed both before and after administration of intravenous
contrast.
CONTRAST:  9mL GADAVIST GADOBUTROL 1 MMOL/ML IV SOLN

[Series 2: fiducial bilateral · sagittal · 2.0mm · 1.33mm/px · 4 of 160 slices shown]
[im 1/160]
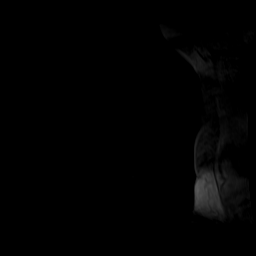
[im 54/160]
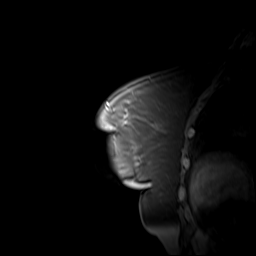
[im 107/160]
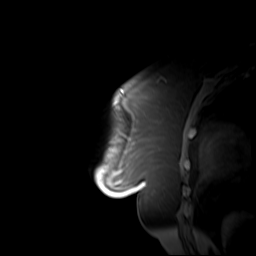
[im 160/160]
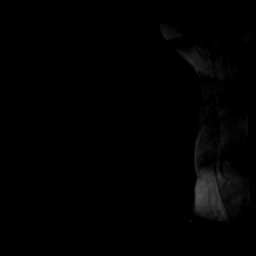

[Series 3: dynamic pre · axial · non-contrast · 1.3mm · 0.78mm/px · z∈[-103,+104]mm · 4 of 160 slices shown]
[im 1/160]
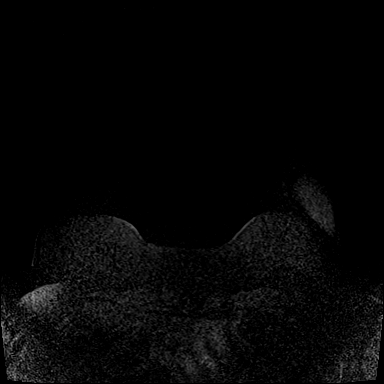
[im 54/160]
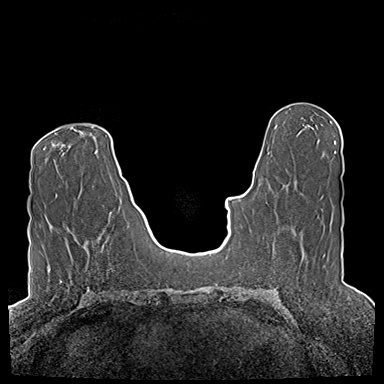
[im 107/160]
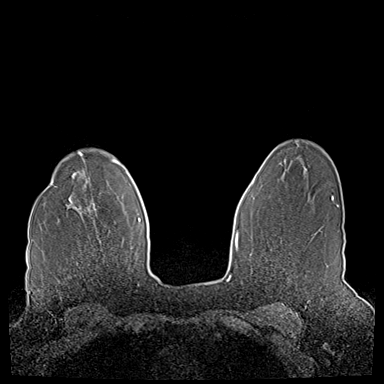
[im 160/160]
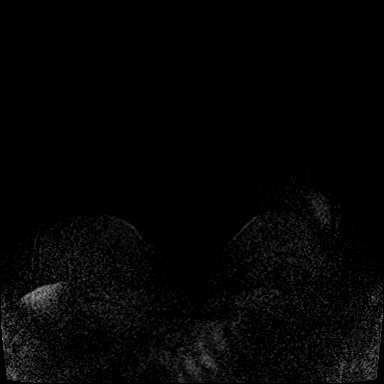

[Series 4: dynamic post 20 · axial · 1.3mm · 0.78mm/px · z∈[-103,+104]mm · 5 of 160 slices shown (1 of 2)]
[im 1/160]
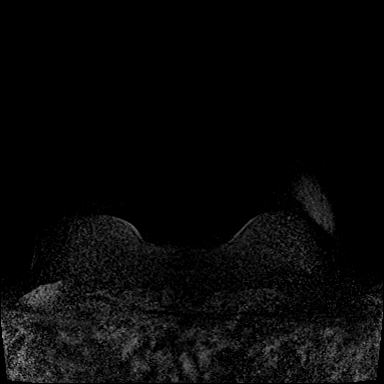
[im 40/160]
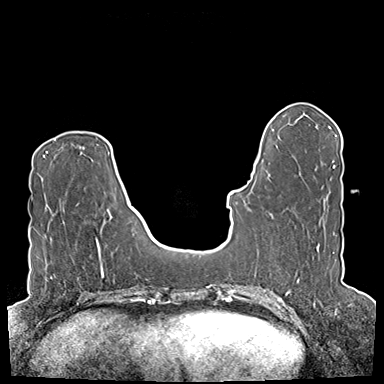
[im 80/160]
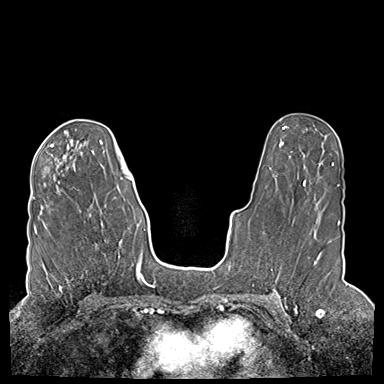
[im 120/160]
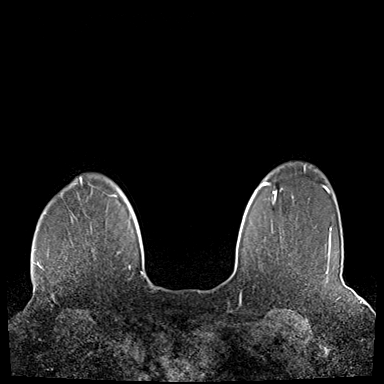
[im 160/160]
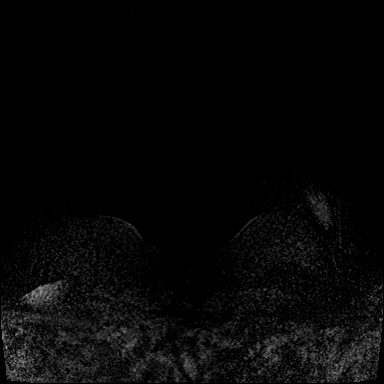

[Series 5: dynamic post 20 · axial · 1.3mm · 0.78mm/px · z∈[-103,+104]mm · 5 of 160 slices shown (2 of 2)]
[im 1/160]
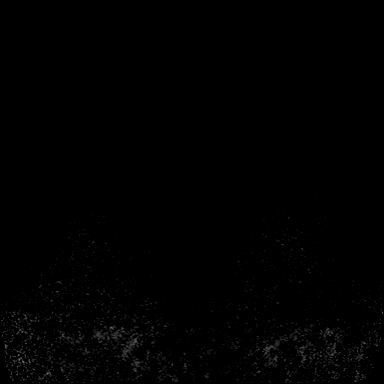
[im 40/160]
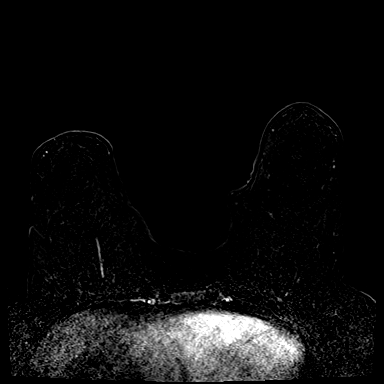
[im 80/160]
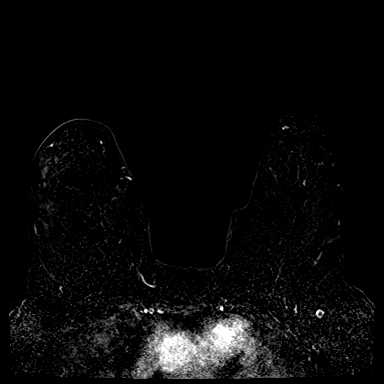
[im 120/160]
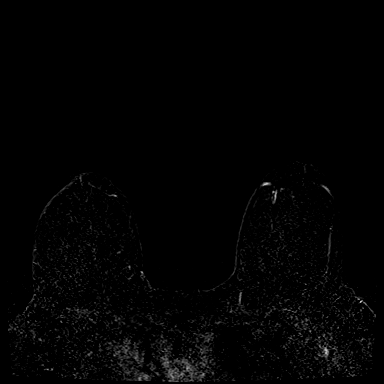
[im 160/160]
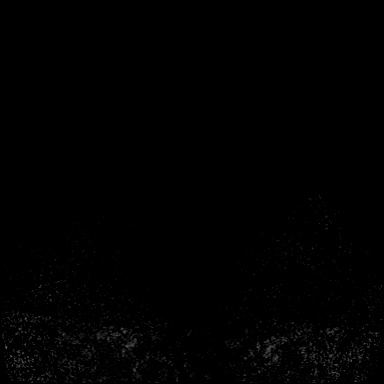

[Series 6: dynamic post 3 · axial · 1.3mm · 0.78mm/px · z∈[-103,+104]mm · 5 of 160 slices shown (1 of 2)]
[im 1/160]
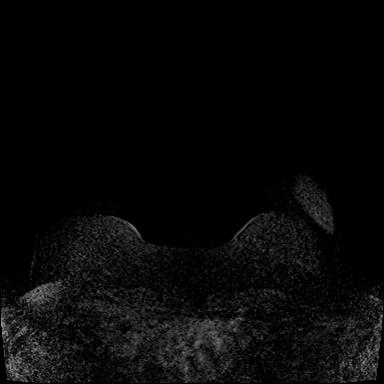
[im 40/160]
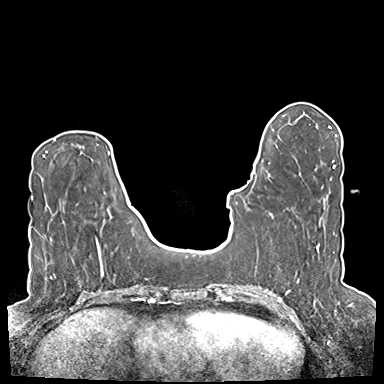
[im 80/160]
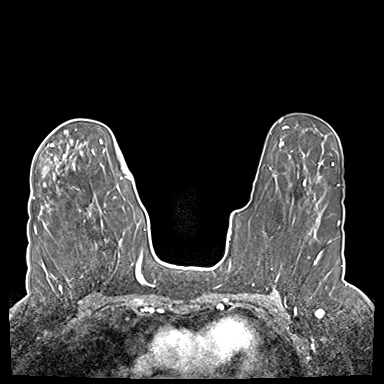
[im 120/160]
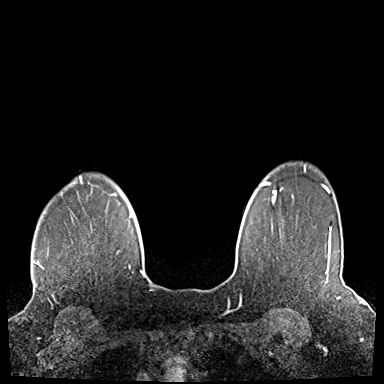
[im 160/160]
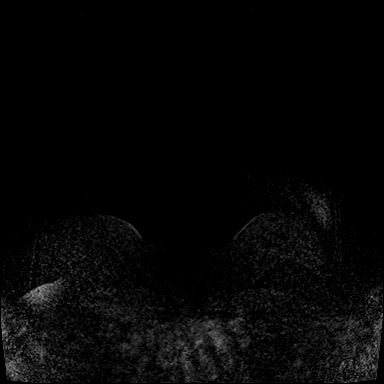

[Series 7: dynamic post 3 · axial · 1.3mm · 0.78mm/px · z∈[-103,+104]mm · 5 of 160 slices shown (2 of 2)]
[im 1/160]
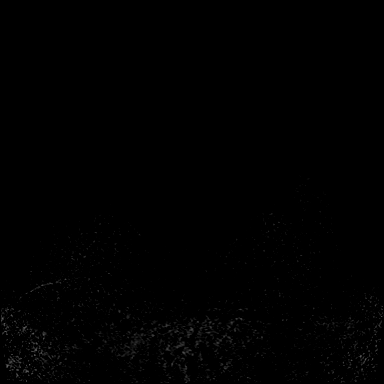
[im 40/160]
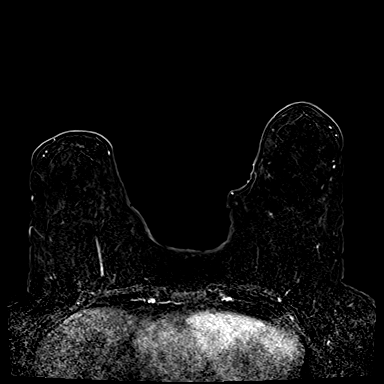
[im 80/160]
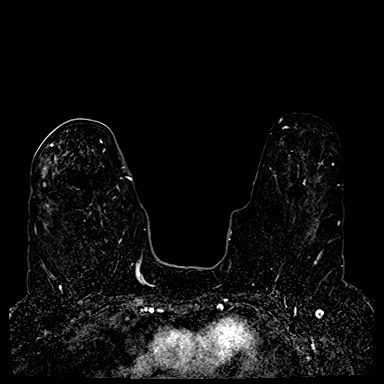
[im 120/160]
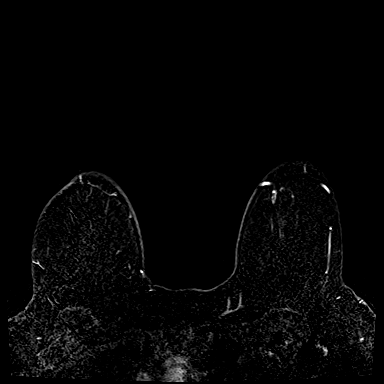
[im 160/160]
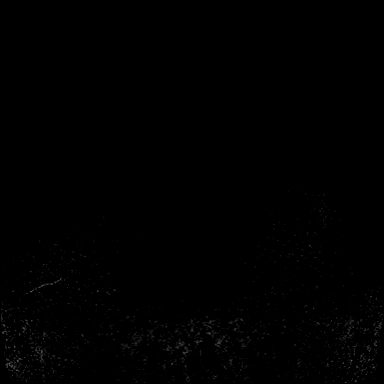

[Series 8: needle confirmation · axial · 1.3mm · 0.78mm/px · z∈[-103,-0]mm · 3 of 160 slices shown]
[im 1/160]
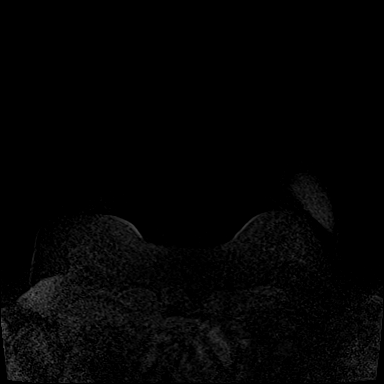
[im 40/160]
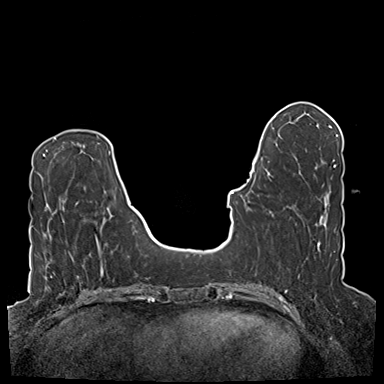
[im 80/160]
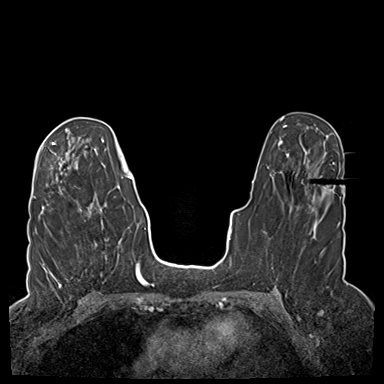

[31 of 48 positions shown; findings below may reference images not displayed]

FINDINGS: I met with the patient, and we discussed the procedure of MRI guided
biopsy, including risks, benefits, and alternatives. Specifically,
we discussed the risks of infection, bleeding, tissue injury, clip
migration, and inadequate sampling. Informed, written consent was
given. The usual time out protocol was performed immediately prior
to the procedure.

SITE 1: LEFT BREAST UPPER CENTRAL: ENHANCING FOCUS: Using sterile
technique, 1% Lidocaine, MRI guidance, and a 9 gauge vacuum assisted
device, biopsy was performed of the enhancing focus in the upper
central left breast using a lateral to medial approach. At the
conclusion of the procedure, a cylindrical shaped tissue marker clip
was deployed into the biopsy cavity. Follow-up 2-view mammogram was
performed and dictated separately.

SITE 2: RIGHT BREAST UPPER OUTER: NON MASS ENHANCEMENT: Using
sterile technique, 1% Lidocaine, MRI guidance, and a 9 gauge vacuum
assisted device, biopsy was performed of the non mass enhancement
adjacent to the lumpectomy site in the right breast using a lateral
to medial approach. At the conclusion of the procedure, a
cylindrical shaped tissue marker clip was deployed into the biopsy
cavity. Follow-up 2-view mammogram was performed and dictated
separately.
IMPRESSION: 1. MRI guided biopsy of the enhancing focus in the upper central
left breast, at site of cylindrical shaped biopsy marking clip.

2. MRI guided biopsy of the non mass enhancement adjacent to the
lumpectomy site in the upper-outer right breast, at site of
cylindrical shaped biopsy marking clip.

ADDENDUM:
Pathology revealed FIBROCYSTIC CHANGES of the LEFT breast, upper
central. This was found to be concordant by Dr. NATHALIE.

Pathology revealed FAT NECROSIS of the RIGHT breast, upper outer
quadrant non-mass enhancement. This was found to be concordant by
Dr. NATHALIE.

Pathology results were discussed with the patient by telephone. The
patient reported doing well after the biopsies with tenderness at
the sites. Post biopsy instructions and care were reviewed and
questions were answered. The patient was encouraged to call The

The patient has a recent diagnosis of BILATERAL breast cancer and
should follow her outlined treatment plan.

Pathology results reported by NATHALIE, RN on [DATE].

*** End of Addendum ***
FINDINGS: I met with the patient, and we discussed the procedure of MRI guided
biopsy, including risks, benefits, and alternatives. Specifically,
we discussed the risks of infection, bleeding, tissue injury, clip
migration, and inadequate sampling. Informed, written consent was
given. The usual time out protocol was performed immediately prior
to the procedure.

SITE 1: LEFT BREAST UPPER CENTRAL: ENHANCING FOCUS: Using sterile
technique, 1% Lidocaine, MRI guidance, and a 9 gauge vacuum assisted
device, biopsy was performed of the enhancing focus in the upper
central left breast using a lateral to medial approach. At the
conclusion of the procedure, a cylindrical shaped tissue marker clip
was deployed into the biopsy cavity. Follow-up 2-view mammogram was
performed and dictated separately.

SITE 2: RIGHT BREAST UPPER OUTER: NON MASS ENHANCEMENT: Using
sterile technique, 1% Lidocaine, MRI guidance, and a 9 gauge vacuum
assisted device, biopsy was performed of the non mass enhancement
adjacent to the lumpectomy site in the right breast using a lateral
to medial approach. At the conclusion of the procedure, a
cylindrical shaped tissue marker clip was deployed into the biopsy
cavity. Follow-up 2-view mammogram was performed and dictated
separately.
IMPRESSION: 1. MRI guided biopsy of the enhancing focus in the upper central
left breast, at site of cylindrical shaped biopsy marking clip.

2. MRI guided biopsy of the non mass enhancement adjacent to the
lumpectomy site in the upper-outer right breast, at site of
cylindrical shaped biopsy marking clip.

## 2020-03-24 IMAGING — MG MM BREAST LOCALIZATION CLIP
4 series · 4 of 12 positions shown · non-contrast
Comparison: Previous exams.

CLINICAL DATA: Post MRI guided biopsy of an enhancing focus in the
upper central left breast and MRI guided biopsy of non mass
enhancement adjacent to the lumpectomy site in the upper-outer right
breast.

EXAM:
DIAGNOSTIC BILATERAL MAMMOGRAM POST MRI BIOPSY

[L CC synth-2D]
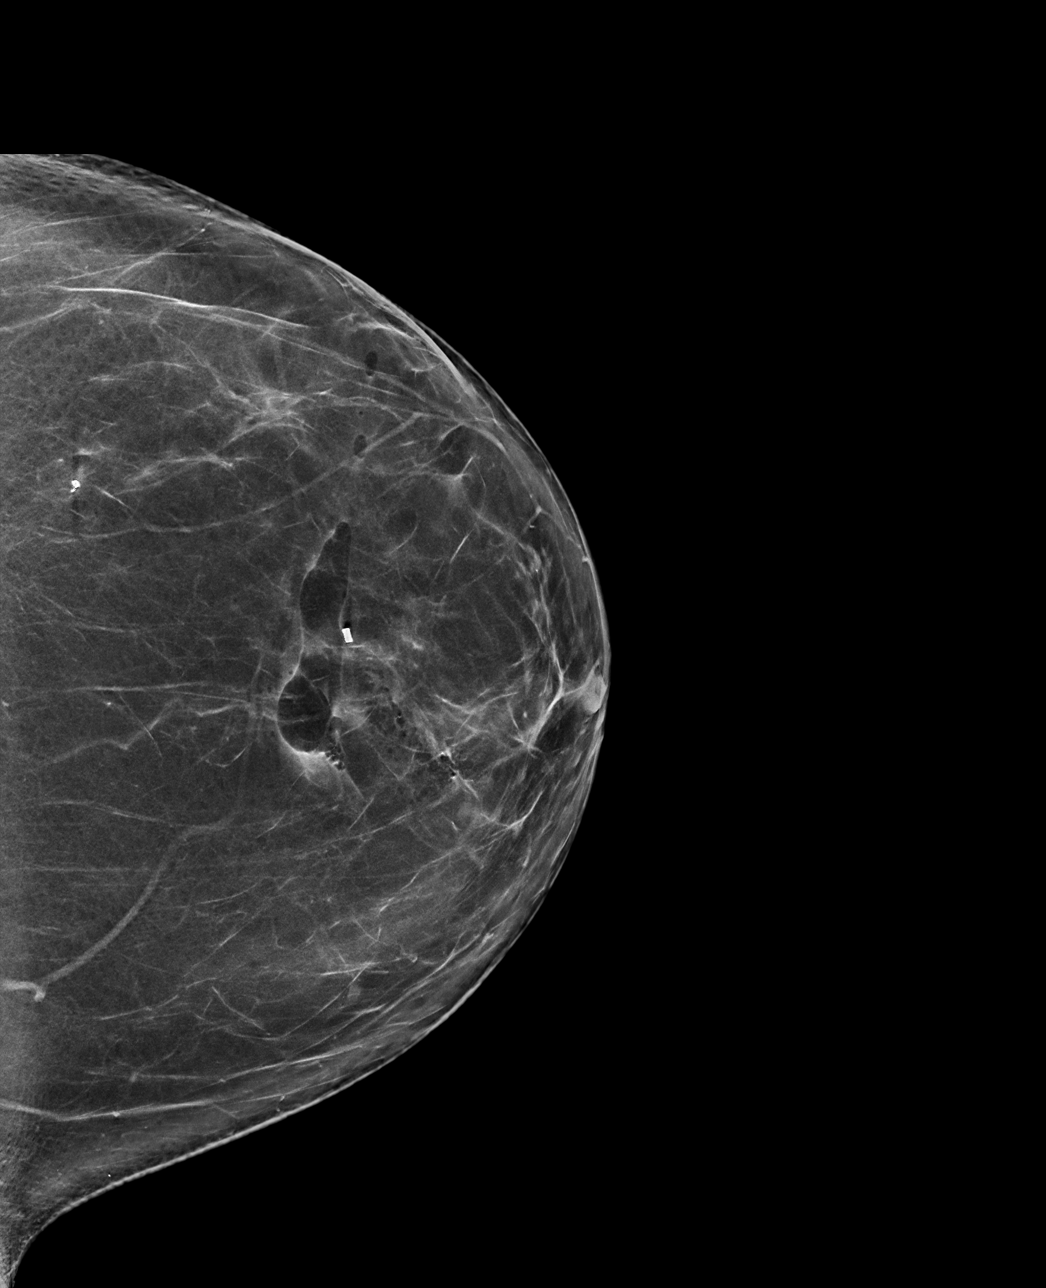

[L ML synth-2D]
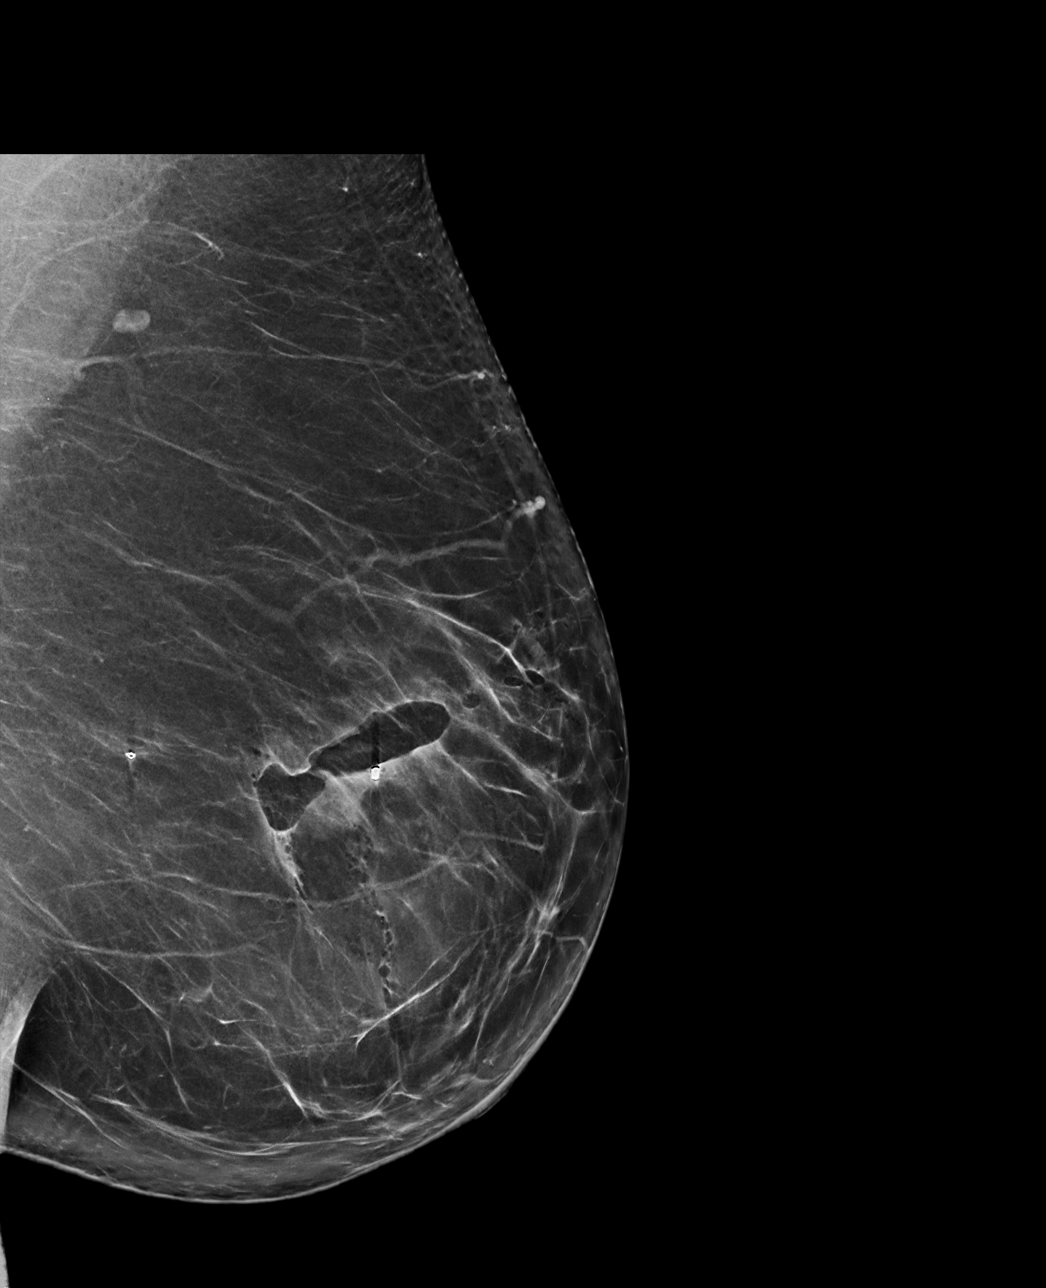

[L CC tomo · tomo slice 41/82.0]
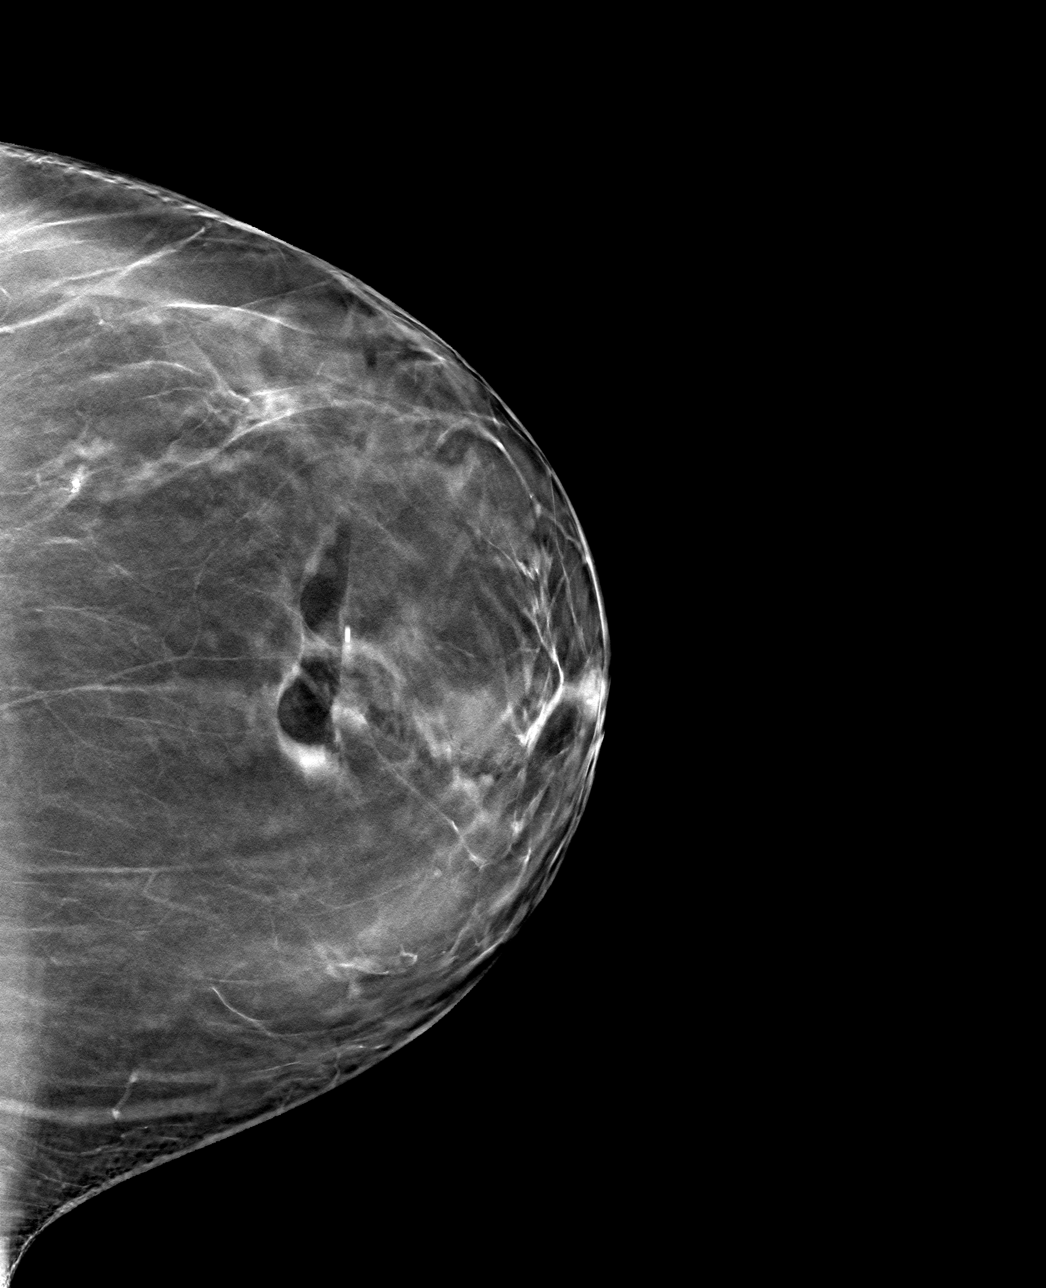

[L ML tomo · tomo slice 45/88.0]
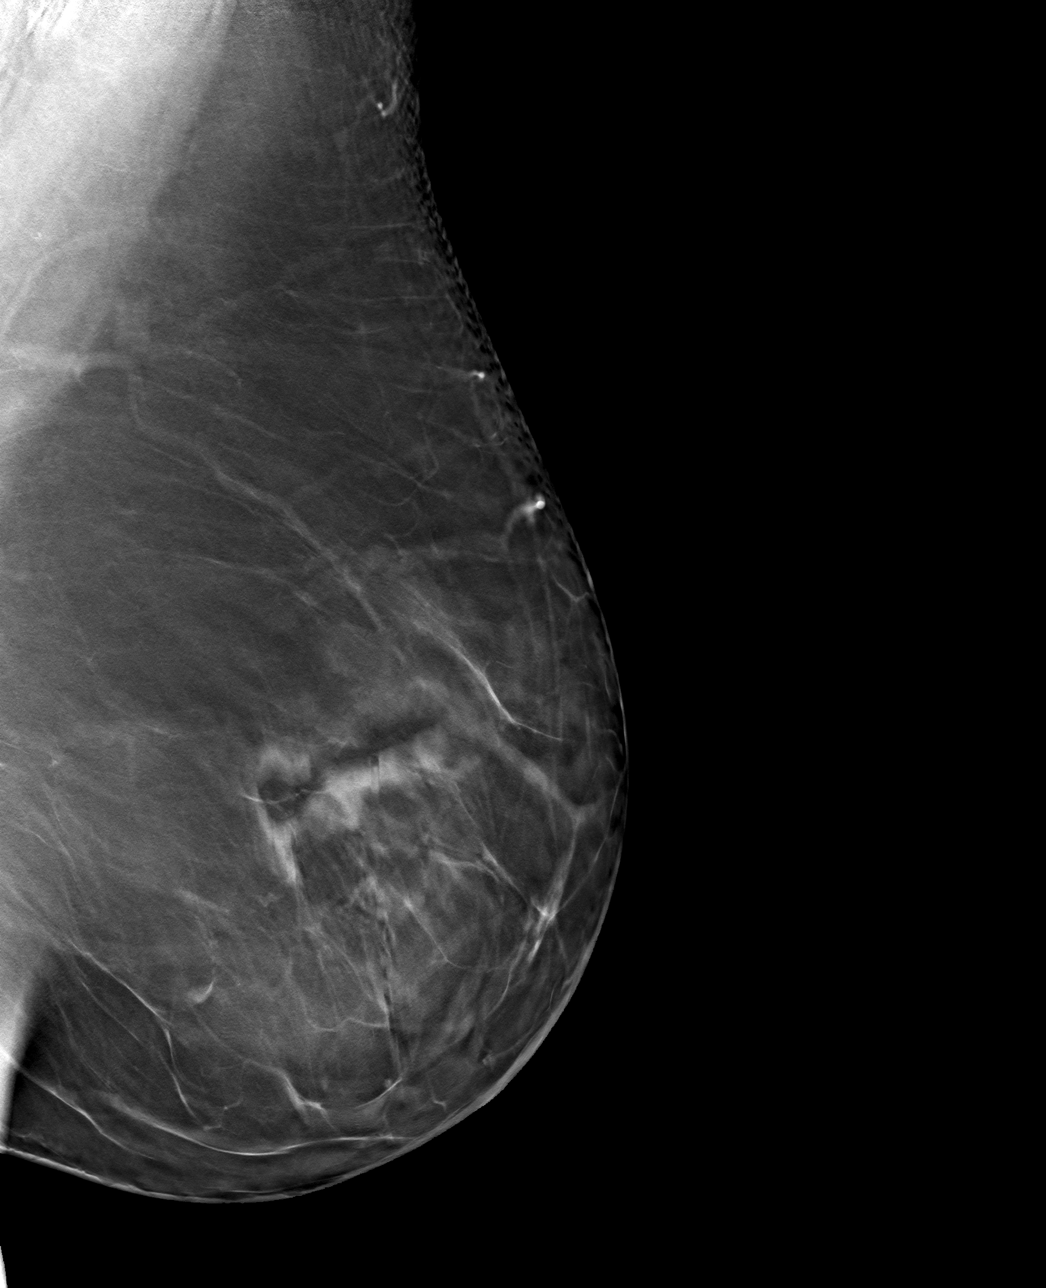

[4 of 12 positions shown; findings below may reference images not displayed]

FINDINGS: Mammographic images were obtained following MRI guided biopsy of an
enhancing focus in the upper central left breast and MRI guided
biopsy of non mass enhancement adjacent to the lumpectomy site in
the upper-outer right breast. A cylindrical shaped biopsy marking
clip is present at the site of the biopsied enhancing focus in the
upper central left breast. A cylindrical shaped biopsy marking clip
is present at the site of the biopsied non mass enhancement in the
region of the lumpectomy site in the upper-outer right breast.
IMPRESSION: 1. Cylindrical shaped biopsy marking clip at site of biopsied
enhancing focus in the upper central left breast.

2. Cylindrical shaped biopsy marking clip at site of biopsied non
mass enhancement in the region of the lumpectomy site in the
upper-outer right breast.

Final Assessment: Post Procedure Mammograms for Marker Placement

## 2020-03-24 IMAGING — MR MR BREAST BX W/ LOC DEV 1ST LEASION IMAGE BX SPEC MR GUIDE*R*
1 series · 10 of 10 positions shown · IV contrast (gadavist)
Comparison: Previous exams.
COMPARISON: Previous exams.

Addendum:
CLINICAL DATA: 58-year-old female presents for MRI guided biopsy of
non mass enhancement surrounding the right breast lumpectomy site in
the upper-outer right breast and for MRI guided biopsy of enhancing
focus in the upper central left breast.

EXAM:
MRI GUIDED CORE NEEDLE BIOPSY OF THE BILATERAL BREASTS
TECHNIQUE: Multiplanar, multisequence MR imaging of the bilateral breast was
performed both before and after administration of intravenous
contrast.
CONTRAST:  9mL GADAVIST GADOBUTROL 1 MMOL/ML IV SOLN

[Series 2: localizer · axial · 10.0mm · 0.78mm/px · z∈[-24,+200]mm · 10 of 10 slices shown]
[im 1/10]
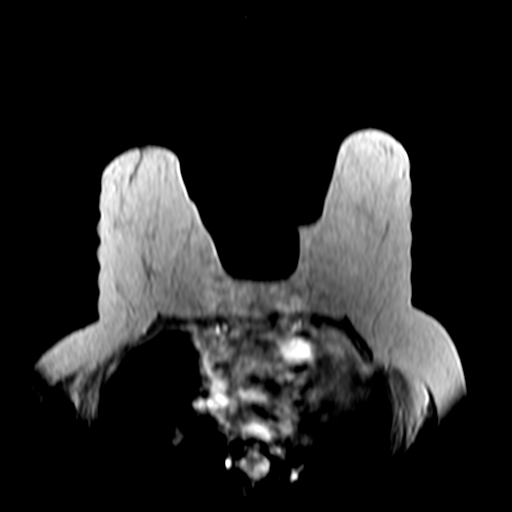
[im 2/10]
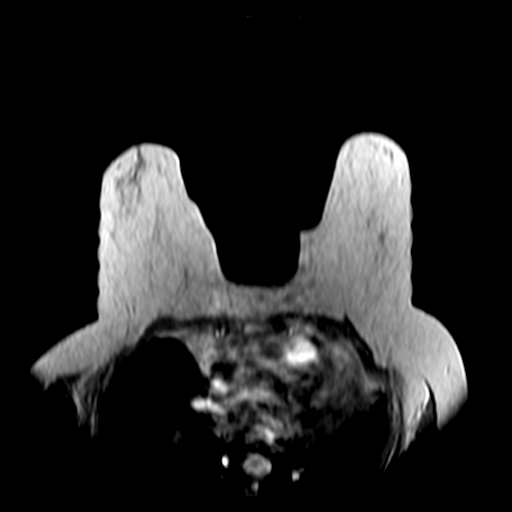
[im 3/10]
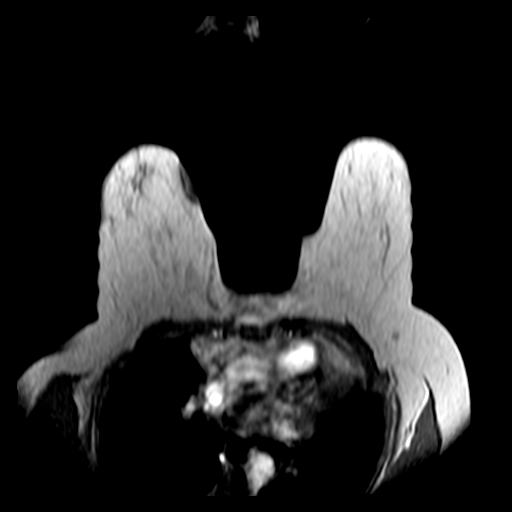
[im 4/10]
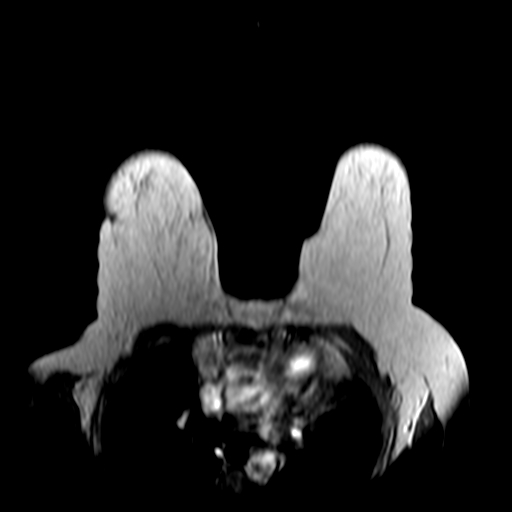
[im 5/10]
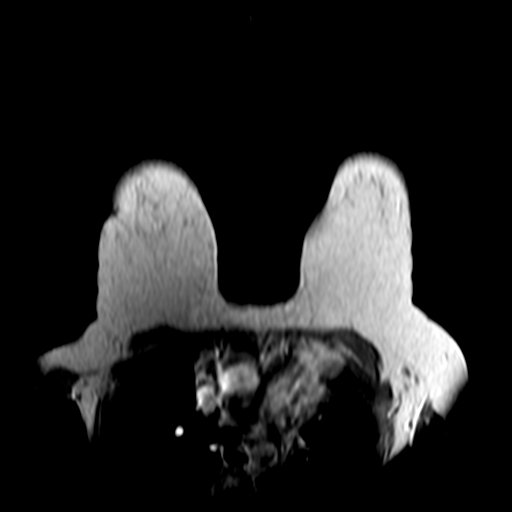
[im 6/10]
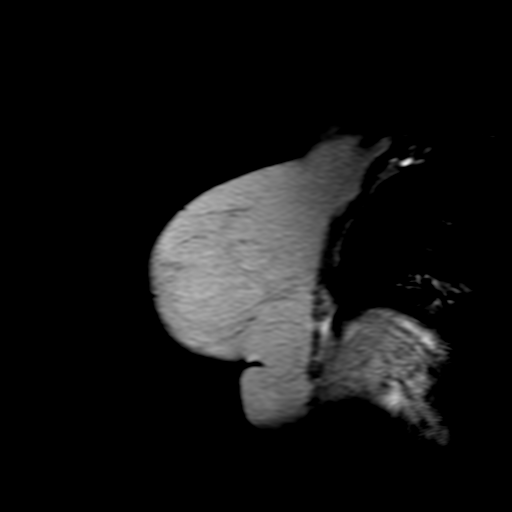
[im 7/10]
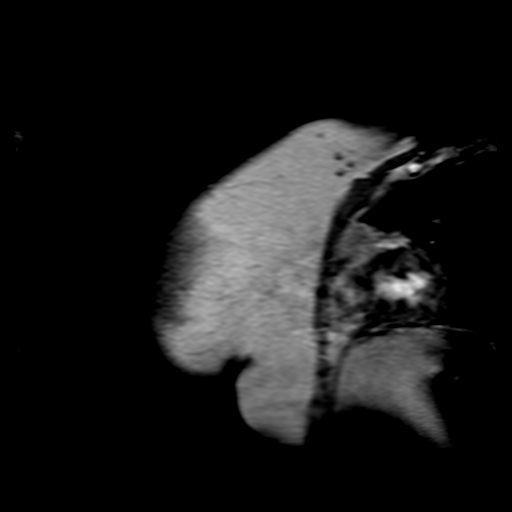
[im 8/10]
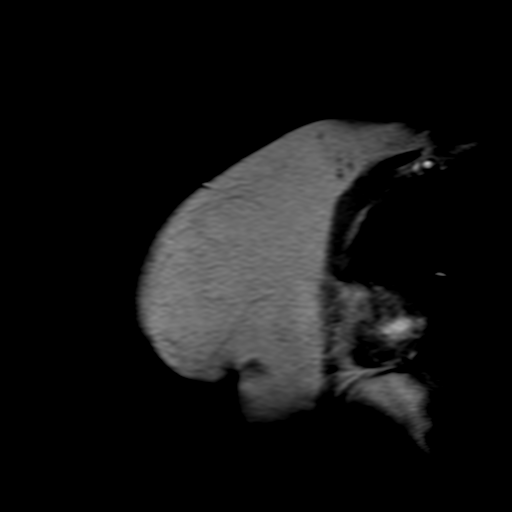
[im 9/10]
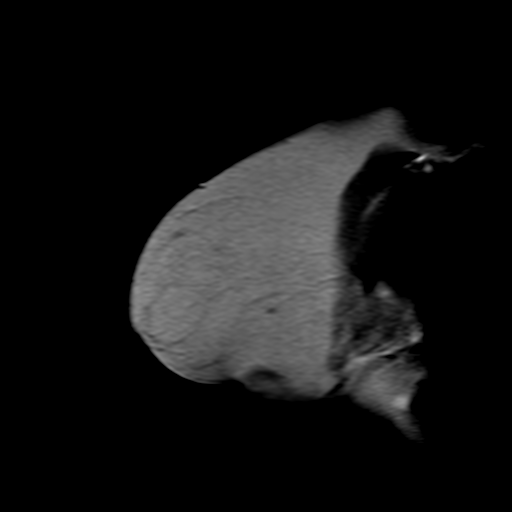
[im 10/10]
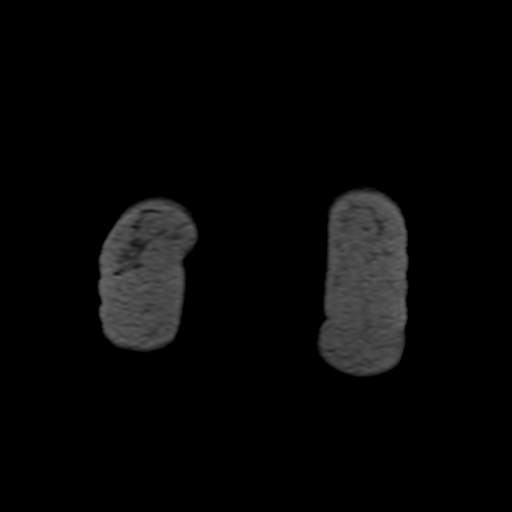

[10 of 10 positions shown; findings below may reference images not displayed]

FINDINGS: I met with the patient, and we discussed the procedure of MRI guided
biopsy, including risks, benefits, and alternatives. Specifically,
we discussed the risks of infection, bleeding, tissue injury, clip
migration, and inadequate sampling. Informed, written consent was
given. The usual time out protocol was performed immediately prior
to the procedure.

SITE 1: LEFT BREAST UPPER CENTRAL: ENHANCING FOCUS: Using sterile
technique, 1% Lidocaine, MRI guidance, and a 9 gauge vacuum assisted
device, biopsy was performed of the enhancing focus in the upper
central left breast using a lateral to medial approach. At the
conclusion of the procedure, a cylindrical shaped tissue marker clip
was deployed into the biopsy cavity. Follow-up 2-view mammogram was
performed and dictated separately.

SITE 2: RIGHT BREAST UPPER OUTER: NON MASS ENHANCEMENT: Using
sterile technique, 1% Lidocaine, MRI guidance, and a 9 gauge vacuum
assisted device, biopsy was performed of the non mass enhancement
adjacent to the lumpectomy site in the right breast using a lateral
to medial approach. At the conclusion of the procedure, a
cylindrical shaped tissue marker clip was deployed into the biopsy
cavity. Follow-up 2-view mammogram was performed and dictated
separately.
IMPRESSION: 1. MRI guided biopsy of the enhancing focus in the upper central
left breast, at site of cylindrical shaped biopsy marking clip.

2. MRI guided biopsy of the non mass enhancement adjacent to the
lumpectomy site in the upper-outer right breast, at site of
cylindrical shaped biopsy marking clip.

ADDENDUM:
Pathology revealed FIBROCYSTIC CHANGES of the LEFT breast, upper
central. This was found to be concordant by Dr. NATHALIE.

Pathology revealed FAT NECROSIS of the RIGHT breast, upper outer
quadrant non-mass enhancement. This was found to be concordant by
Dr. NATHALIE.

Pathology results were discussed with the patient by telephone. The
patient reported doing well after the biopsies with tenderness at
the sites. Post biopsy instructions and care were reviewed and
questions were answered. The patient was encouraged to call The

The patient has a recent diagnosis of BILATERAL breast cancer and
should follow her outlined treatment plan.

Pathology results reported by NATHALIE, RN on [DATE].

*** End of Addendum ***
FINDINGS: I met with the patient, and we discussed the procedure of MRI guided
biopsy, including risks, benefits, and alternatives. Specifically,
we discussed the risks of infection, bleeding, tissue injury, clip
migration, and inadequate sampling. Informed, written consent was
given. The usual time out protocol was performed immediately prior
to the procedure.

SITE 1: LEFT BREAST UPPER CENTRAL: ENHANCING FOCUS: Using sterile
technique, 1% Lidocaine, MRI guidance, and a 9 gauge vacuum assisted
device, biopsy was performed of the enhancing focus in the upper
central left breast using a lateral to medial approach. At the
conclusion of the procedure, a cylindrical shaped tissue marker clip
was deployed into the biopsy cavity. Follow-up 2-view mammogram was
performed and dictated separately.

SITE 2: RIGHT BREAST UPPER OUTER: NON MASS ENHANCEMENT: Using
sterile technique, 1% Lidocaine, MRI guidance, and a 9 gauge vacuum
assisted device, biopsy was performed of the non mass enhancement
adjacent to the lumpectomy site in the right breast using a lateral
to medial approach. At the conclusion of the procedure, a
cylindrical shaped tissue marker clip was deployed into the biopsy
cavity. Follow-up 2-view mammogram was performed and dictated
separately.
IMPRESSION: 1. MRI guided biopsy of the enhancing focus in the upper central
left breast, at site of cylindrical shaped biopsy marking clip.

2. MRI guided biopsy of the non mass enhancement adjacent to the
lumpectomy site in the upper-outer right breast, at site of
cylindrical shaped biopsy marking clip.

## 2020-03-24 MED ORDER — GADOBUTROL 1 MMOL/ML IV SOLN
9.0000 mL | Freq: Once | INTRAVENOUS | Status: AC | PRN
  Administered 2020-03-24: 9 mL via INTRAVENOUS

## 2020-03-25 ENCOUNTER — Other Ambulatory Visit (HOSPITAL_COMMUNITY): Payer: Self-pay | Admitting: Surgery

## 2020-03-25 ENCOUNTER — Telehealth: Payer: Self-pay | Admitting: Oncology

## 2020-03-25 DIAGNOSIS — C50912 Malignant neoplasm of unspecified site of left female breast: Secondary | ICD-10-CM

## 2020-03-25 DIAGNOSIS — C50911 Malignant neoplasm of unspecified site of right female breast: Secondary | ICD-10-CM

## 2020-03-25 NOTE — Telephone Encounter (Signed)
MD asked writer to reschedule patient for 2 weeks after surgery. Writer phoned patient who stated that she had a family emergency at this time and that she also did not know when her surgery is going to be scheduled for yet. Writer stated that Probation officer would follow up with patient the next week. MD informed.

## 2020-03-27 ENCOUNTER — Encounter: Payer: Self-pay | Admitting: Oncology

## 2020-03-29 ENCOUNTER — Telehealth: Payer: Self-pay | Admitting: Oncology

## 2020-03-29 ENCOUNTER — Other Ambulatory Visit (HOSPITAL_COMMUNITY): Payer: Self-pay | Admitting: Surgery

## 2020-03-29 DIAGNOSIS — C50912 Malignant neoplasm of unspecified site of left female breast: Secondary | ICD-10-CM

## 2020-03-29 DIAGNOSIS — C50911 Malignant neoplasm of unspecified site of right female breast: Secondary | ICD-10-CM

## 2020-03-29 DIAGNOSIS — Z170 Estrogen receptor positive status [ER+]: Secondary | ICD-10-CM

## 2020-03-29 NOTE — Telephone Encounter (Signed)
Spoke with patient. Patient stated that her surgery is scheduled for 04-14-20. Appt with MD has been rescheduled to 04-28-20.

## 2020-03-30 ENCOUNTER — Other Ambulatory Visit: Payer: Self-pay

## 2020-03-30 ENCOUNTER — Encounter: Payer: 59 | Attending: Internal Medicine | Admitting: Dietician

## 2020-03-30 DIAGNOSIS — Z713 Dietary counseling and surveillance: Secondary | ICD-10-CM

## 2020-03-30 NOTE — Progress Notes (Signed)
[employer] "self referral" nutrition session follow-up: Start time: [id number]   End time: 1605  Progress Note:   Pt has made several steps towards new habits   purchased greek yogurt, plans on trying it soon   Using orange mio drops and crystal light to flavor water to increase water intake  Pt reported having a vegetable at a meal on 4 days last week  Physical activity: walked some over the weekend.  Pt plans on continue activity as tolerated.   Education topics covered during this visit:  General nutrition/ Healthy eating  Weight Concerns  Other Medical Conditions: oncology nutrition  Educational resources provided:  Oncology Nutrition: Tips for [place]: Appetite Loss/Fatigue handouts  Additional Comments: Pt scheduled for surgery on May 20th  and radiation several weeks later  Planning ahead:  Have some nutritional shakes at home in anticipation of fatigue and potential loss of appetite  Batch cook meals to make it easier to eat throughout the week  Focus on eating biggest meal at beginning of the day

## 2020-04-04 ENCOUNTER — Inpatient Hospital Stay: Payer: 59 | Admitting: Oncology

## 2020-04-11 ENCOUNTER — Other Ambulatory Visit (HOSPITAL_COMMUNITY)
Admission: RE | Admit: 2020-04-11 | Discharge: 2020-04-11 | Disposition: A | Discharge status: Home / Self Care | Payer: 59 | Source: Ambulatory Visit | Attending: Surgery | Admitting: Surgery

## 2020-04-11 ENCOUNTER — Encounter (HOSPITAL_COMMUNITY): Payer: Self-pay

## 2020-04-11 ENCOUNTER — Other Ambulatory Visit: Payer: Self-pay

## 2020-04-11 ENCOUNTER — Encounter (HOSPITAL_COMMUNITY)
Admission: RE | Admit: 2020-04-11 | Discharge: 2020-04-11 | Disposition: A | Discharge status: Home / Self Care | Payer: 59 | Source: Ambulatory Visit | Attending: Surgery | Admitting: Surgery

## 2020-04-11 DIAGNOSIS — Z20822 Contact with and (suspected) exposure to covid-19: Secondary | ICD-10-CM | POA: Diagnosis not present

## 2020-04-11 DIAGNOSIS — Z01812 Encounter for preprocedural laboratory examination: Secondary | ICD-10-CM | POA: Insufficient documentation

## 2020-04-11 HISTORY — DX: Nausea with vomiting, unspecified: R11.2

## 2020-04-11 HISTORY — DX: Other specified postprocedural states: Z98.890

## 2020-04-11 LAB — BASIC METABOLIC PANEL
Anion gap: 9 (ref 5–15)
BUN: 11 mg/dL (ref 6–20)
CO2: 28 mmol/L (ref 22–32)
Calcium: 9.8 mg/dL (ref 8.9–10.3)
Chloride: 105 mmol/L (ref 98–111)
Creatinine, Ser: 0.94 mg/dL (ref 0.44–1.00)
GFR calc Af Amer: >60 mL/min (ref >60)
GFR calc non Af Amer: >60 mL/min (ref >60)
Glucose, Bld: 97 mg/dL (ref 70–99)
Potassium: 4.3 mmol/L (ref 3.5–5.1)
Sodium: 142 mmol/L (ref 135–145)

## 2020-04-11 LAB — CBC
HCT: 45.3 % (ref 36.0–46.0)
Hemoglobin: 14.3 g/dL (ref 12.0–15.0)
MCH: 29.7 pg (ref 26.0–34.0)
MCHC: 31.6 g/dL (ref 30.0–36.0)
MCV: 94 fL (ref 80.0–100.0)
Platelets: 250 10*3/uL (ref 150–400)
RBC: 4.82 MIL/uL (ref 3.87–5.11)
RDW: 12.9 % (ref 11.5–15.5)
WBC: 4.5 10*3/uL (ref 4.0–10.5)
nRBC: 0 % (ref 0.0–0.2)

## 2020-04-11 NOTE — Progress Notes (Addendum)
Your procedure is scheduled on Thursday May 20.  Report to [facility] Main Entrance "A" at 08:00 A.M., and check in at the Admitting office.  Call this number if you have problems the morning of surgery: [phone]  Call [phone] if you have any questions prior to your surgery date Monday-Friday 8am-4pm   Remember: Do not eat after midnight the night before your surgery  You may drink clear liquids until 07:00 A.M the morning of your surgery.   Clear liquids allowed are: Water, Non-Citrus Juices (without pulp), Carbonated Beverages, Clear Tea, Black Coffee Only, and Gatorade  Please complete your PRE-SURGERY ENSURE that was provided to you by 07:00 A.M morning of surgery.  Please, if able, drink it in one setting. DO NOT SIP.    Take these medicines the morning of surgery with A SIP OF WATER: NONE  If needed: Tylenol, Pepcid   As of today, STOP taking any Aspirin (unless otherwise instructed by your surgeon), Aleve, Naproxen, Ibuprofen, Motrin, Advil, Goody's, BC's, all herbal medications, fish oil, and all vitamins.    The Morning of Surgery  Do not wear jewelry, make-up or nail polish.  Do not wear lotions, powders, or perfumes, or deodorant  Do not shave 48 hours prior to surgery.    Do not bring valuables to the hospital.  [facility] is not responsible for any belongings or valuables.  If you are a smoker, DO NOT Smoke 24 hours prior to surgery  If you wear a CPAP at night please bring your mask the morning of surgery   Remember that you must have someone to transport you home after your surgery, and remain with you for 24 hours if you are discharged the same day.   Please bring cases for contacts, glasses, hearing aids, dentures or bridgework because it cannot be worn into surgery.    Leave your suitcase in the car.  After surgery it may be brought to your room.  For patients admitted to the hospital, discharge time will be determined by your treatment  team.  Patients discharged the day of surgery will not be allowed to drive home.    Special instructions:   [place]- Preparing For Surgery  Before surgery, you can play an important role. Because skin is not sterile, your skin needs to be as free of germs as possible. You can reduce the number of germs on your skin by washing with CHG (chlorahexidine gluconate) Soap before surgery.  CHG is an antiseptic cleaner which kills germs and bonds with the skin to continue killing germs even after washing.    Oral Hygiene is also important to reduce your risk of infection.  Remember - BRUSH YOUR TEETH THE MORNING OF SURGERY WITH YOUR REGULAR TOOTHPASTE  Please do not use if you have an allergy to CHG or antibacterial soaps. If your skin becomes reddened/irritated stop using the CHG.  Do not shave (including legs and underarms) for at least 48 hours prior to first CHG shower. It is OK to shave your face.  Please follow these instructions carefully.   1. Shower the NIGHT BEFORE SURGERY and the MORNING OF SURGERY with CHG Soap.   2. If you chose to wash your hair and body, wash as usual with your normal shampoo and body-wash/soap.  3. Rinse your hair and body thoroughly to remove the shampoo and soap.  4. Apply CHG directly to the skin (ONLY FROM THE NECK DOWN) and wash gently with a scrungie or a clean  washcloth.   5. Do not use on open wounds or open sores. Avoid contact with your eyes, ears, mouth and genitals (private parts). Wash Face and genitals (private parts)  with your normal soap.   6. Wash thoroughly, paying special attention to the area where your surgery will be performed.  7. Thoroughly rinse your body with warm water from the neck down.  8. DO NOT shower/wash with your normal soap after using and rinsing off the CHG Soap.  9. Pat yourself dry with a CLEAN TOWEL.  10. Wear CLEAN PAJAMAS to bed the night before surgery  11. Place CLEAN SHEETS on your bed the night of your  first shower and DO NOT SLEEP WITH PETS.  12. Wear comfortable clothes the morning of surgery.     Day of Surgery:  Please shower the morning of surgery with the CHG soap Do not apply any deodorants/lotions. Please wear clean clothes to the hospital/surgery center.   Remember to brush your teeth WITH YOUR REGULAR TOOTHPASTE.   Please read over the following fact sheets that you were given.

## 2020-04-11 NOTE — Progress Notes (Signed)
PCP - Doy Hutching, MD -  [facility] Cardiologist - pt denies  PPM/ICD - n/a   Chest x-ray - n/a EKG - n/a - pt does not have any cardiac hx or DM, James, PA aware Stress Test - pt denies ECHO - pt denies Cardiac Cath - pt denies  Sleep Study - no CPAP - n/a  Blood Thinner Instructions:n/a Aspirin Instructions:n/a  ERAS Protcol - yes PRE-SURGERY Ensure or G2- ensure  COVID TEST- 04/11/20  Coronavirus Screening  Have you experienced the following symptoms:  Cough yes/no: No Fever (>100.25F)  yes/no: No Runny nose yes/no: No Sore throat yes/no: No Difficulty breathing/shortness of breath  yes/no: No  Have you or a family member traveled in the last 14 days and where? yes/no: No   If the patient indicates "YES" to the above questions, their PAT will be rescheduled to limit the exposure to others and, the surgeon will be notified. THE PATIENT WILL NEED TO BE ASYMPTOMATIC FOR 14 DAYS.   If the patient is not experiencing any of these symptoms, the PAT nurse will instruct them to NOT bring anyone with them to their appointment since they may have these symptoms or traveled as well.   Please remind your patients and families that hospital visitation restrictions are in effect and the importance of the restrictions.     Anesthesia review: n/a  Patient denies shortness of breath, fever, cough and chest pain at PAT appointment   All instructions explained to the patient, with a verbal understanding of the material. Patient agrees to go over the instructions while at home for a better understanding. Patient also instructed to self quarantine after being tested for COVID-19. The opportunity to ask questions was provided.

## 2020-04-12 ENCOUNTER — Encounter: Payer: Self-pay | Admitting: Oncology

## 2020-04-12 LAB — SARS CORONAVIRUS 2 (TAT 6-24 HRS): SARS Coronavirus 2: NEGATIVE

## 2020-04-13 ENCOUNTER — Other Ambulatory Visit: Payer: Self-pay

## 2020-04-13 ENCOUNTER — Ambulatory Visit
Admission: RE | Admit: 2020-04-13 | Discharge: 2020-04-13 | Disposition: A | Discharge status: Home / Self Care | Payer: 59 | Source: Ambulatory Visit | Attending: Surgery | Admitting: Surgery

## 2020-04-13 DIAGNOSIS — Z170 Estrogen receptor positive status [ER+]: Secondary | ICD-10-CM

## 2020-04-13 DIAGNOSIS — C50911 Malignant neoplasm of unspecified site of right female breast: Secondary | ICD-10-CM

## 2020-04-13 DIAGNOSIS — C50812 Malignant neoplasm of overlapping sites of left female breast: Secondary | ICD-10-CM | POA: Diagnosis not present

## 2020-04-13 DIAGNOSIS — C50211 Malignant neoplasm of upper-inner quadrant of right female breast: Secondary | ICD-10-CM | POA: Diagnosis not present

## 2020-04-13 DIAGNOSIS — C50912 Malignant neoplasm of unspecified site of left female breast: Secondary | ICD-10-CM

## 2020-04-13 IMAGING — MG MM PLC BREAST LOC DEV 1ST LESION INC*R*
6 series · 6 of 6 positions shown · non-contrast
Comparison: Previous exam(s).

CLINICAL DATA: Recently diagnosed invasive mammary carcinoma in the
3 o'clock position of the left breast, marked with a coil shaped
biopsy marker clip. Recently diagnosed invasive mammary carcinoma in
the 2 o'clock position of the right breast, marked with a heart
shaped biopsy marker clip.

EXAM:
MAMMOGRAPHIC GUIDED RADIOACTIVE SEED LOCALIZATION OF THE LEFT BREAST
MAMMOGRAPHIC GUIDED RADIOACTIVE SEED LOCALIZATION OF THE RIGHT
BREAST

[R ML (1 of 3)]
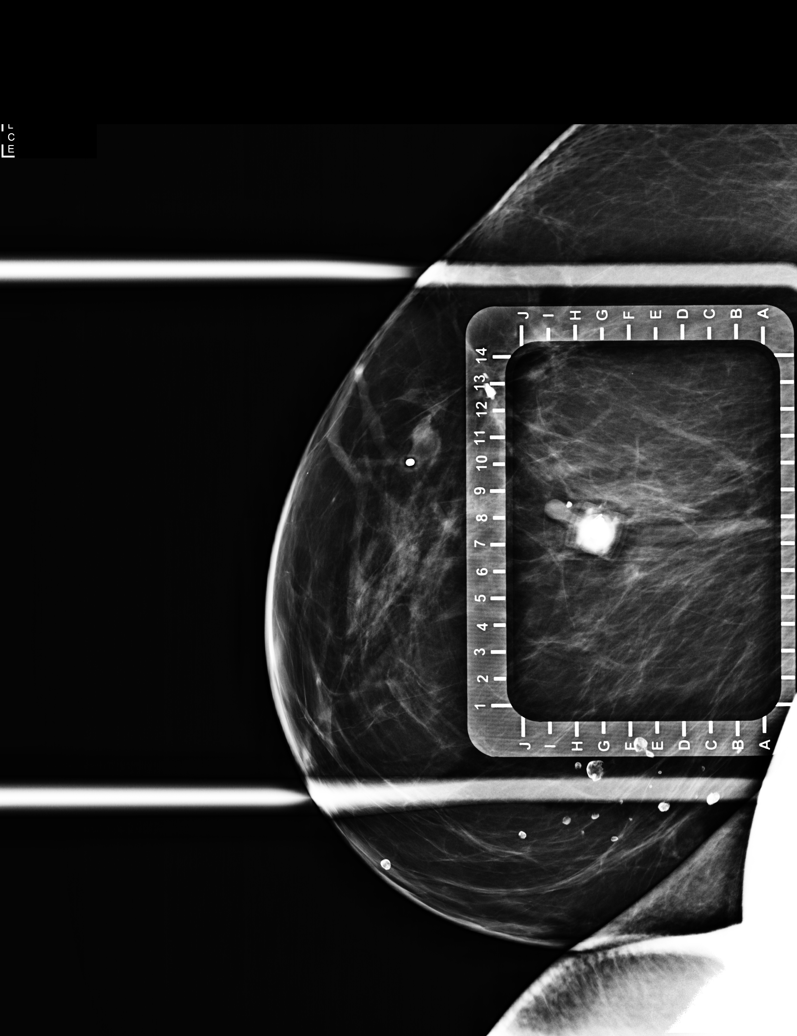

[R CC (1 of 3)]
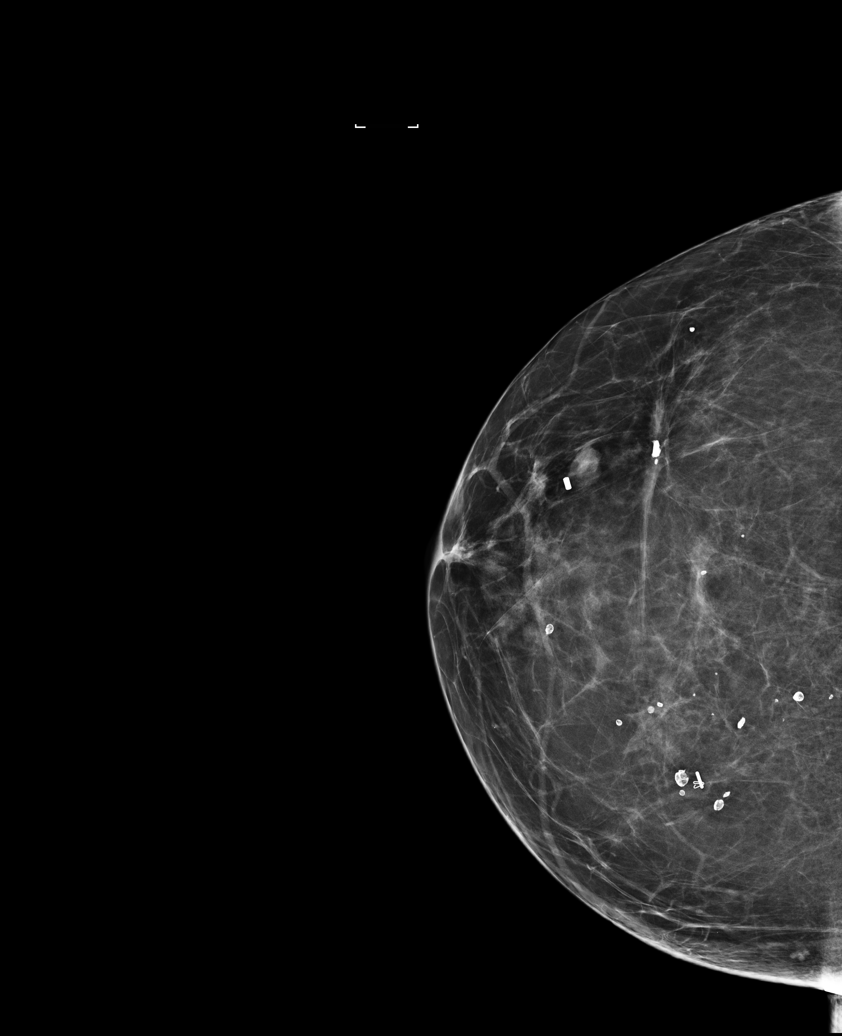

[R CC (2 of 3)]
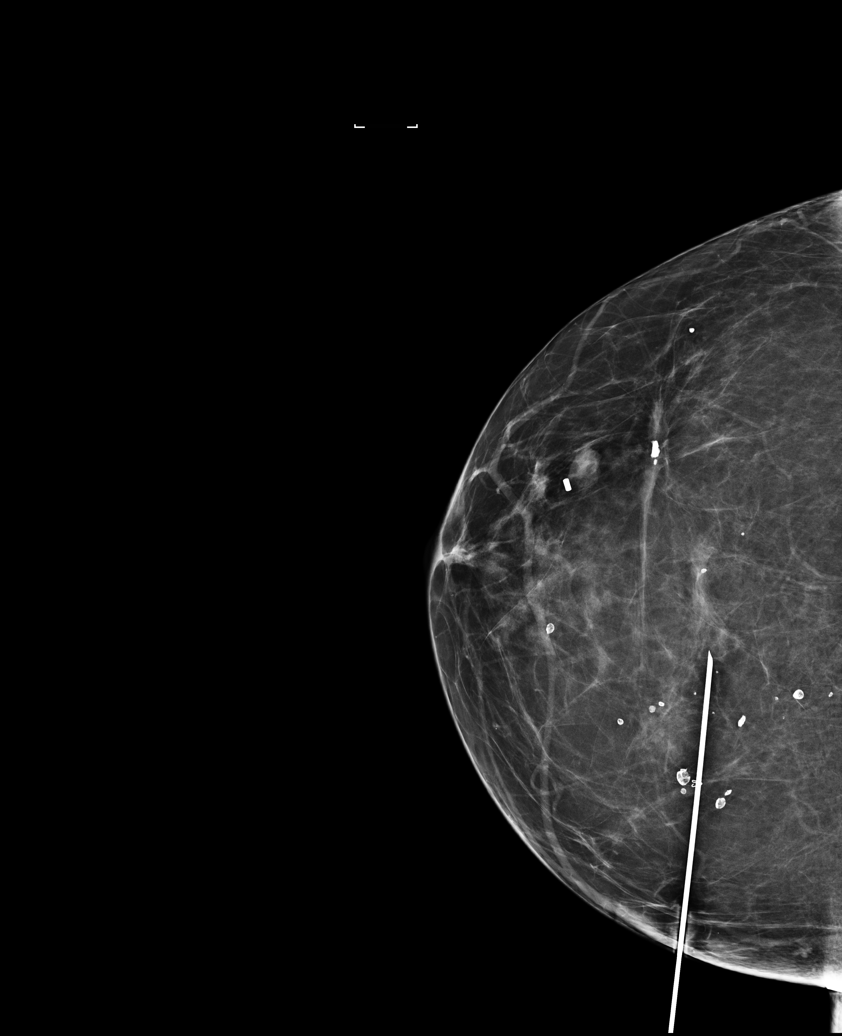

[R ML (2 of 3)]
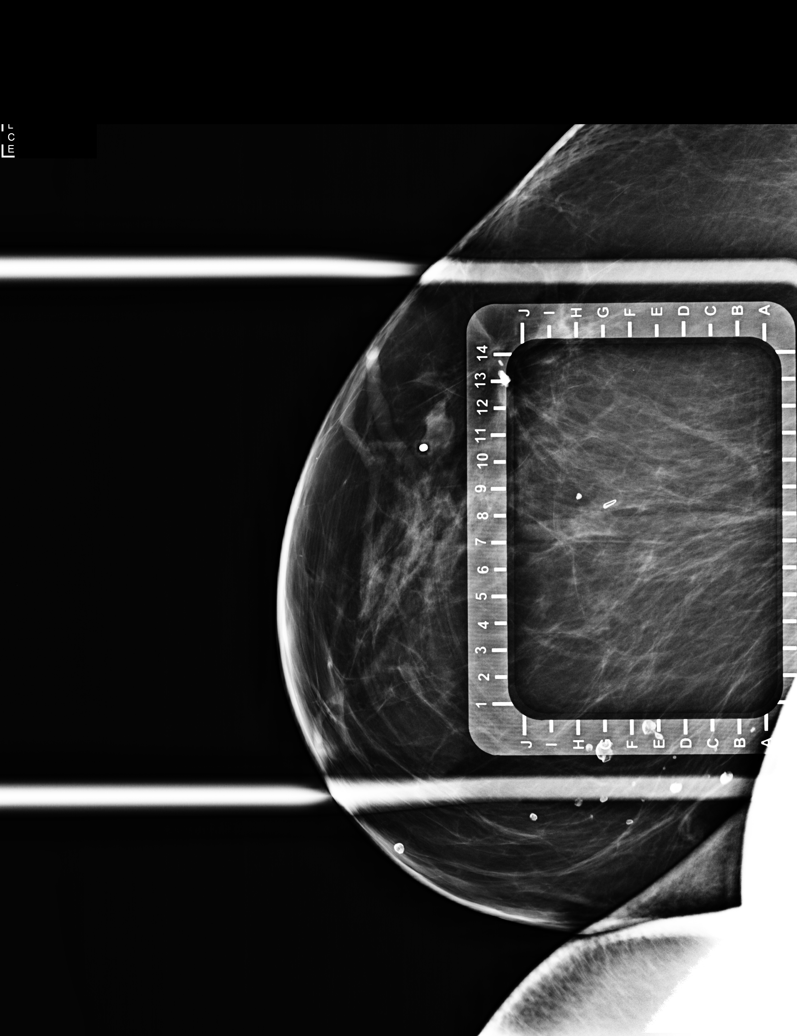

[R ML (3 of 3)]
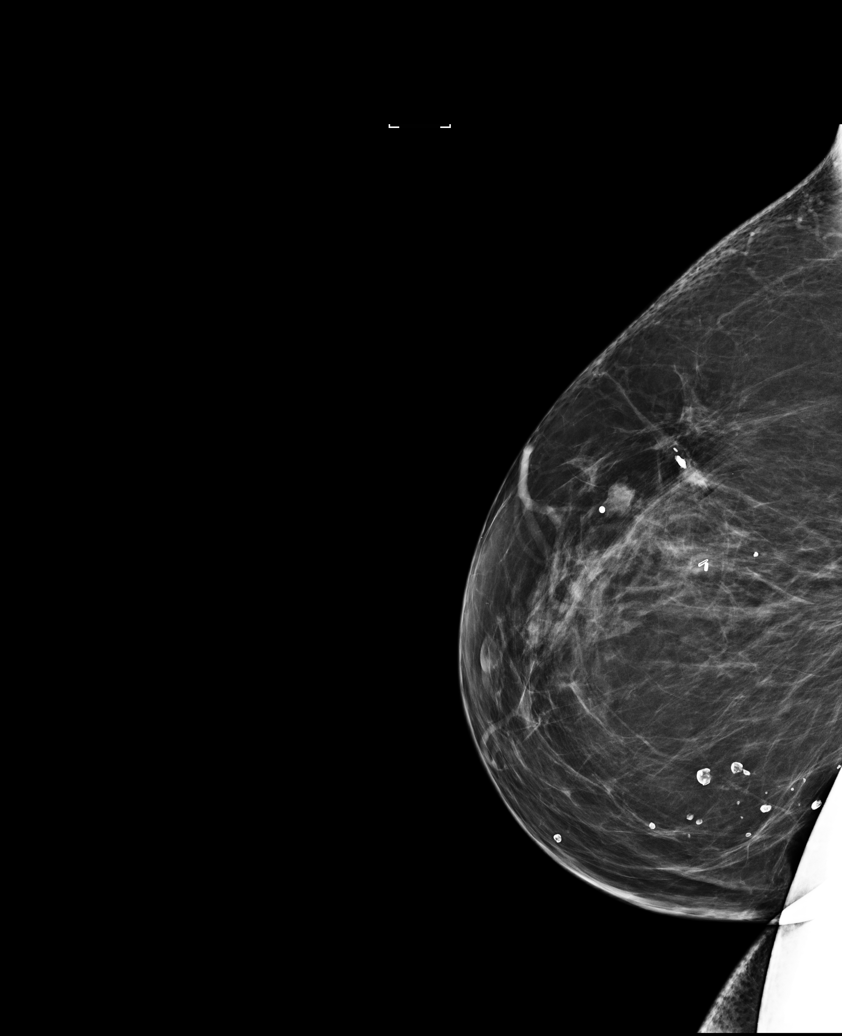

[R CC (3 of 3)]
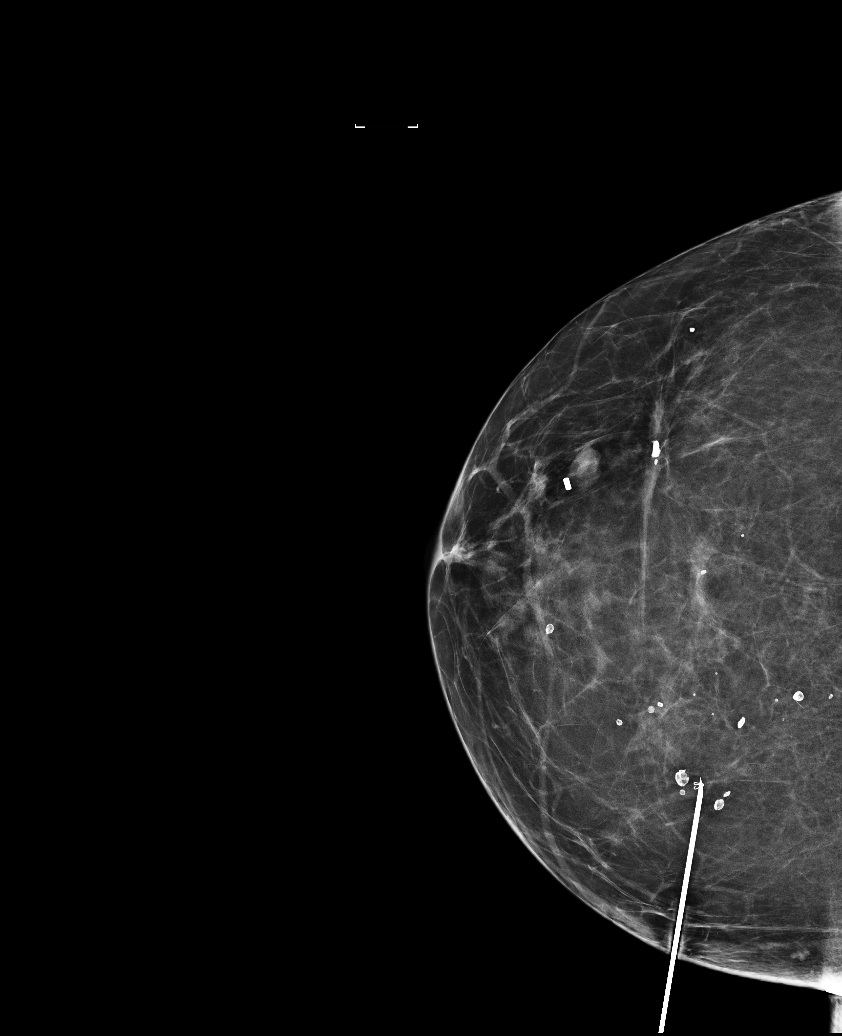

[6 of 6 positions shown; findings below may reference images not displayed]

FINDINGS: Patient presents for radioactive seed localization prior to
bilateral lumpectomies. I met with the patient and we discussed the
procedure of seed localization including benefits and alternatives.
We discussed the high likelihood of successful procedures. We
discussed the risks of the procedure including infection, bleeding,
tissue injury and further surgery. We discussed the low dose of
radioactivity involved in the procedures. Informed, written consent
was given.

The usual time-out protocol was performed immediately prior to the
procedures.

LEFT BREAST 3 O'CLOCK:

Using mammographic guidance, sterile technique, 1% lidocaine and an
[V0] radioactive seed, the recently placed coil shaped biopsy
marker clip in the 3 o'clock position of the left breast was
localized using a lateral approach. The follow-up mammogram images
confirm the seed in the expected location and were marked for Dr.
JOHANNA.

Follow-up survey of the patient confirms presence of the radioactive
seed.

Order number of [V0] seed:  [PHONE_NUMBER].

Total activity:  0.251 mCi reference Date: [DATE]

RIGHT BREAST 2 O'CLOCK:

Using mammographic guidance, sterile technique, 1% lidocaine and an
[V0] radioactive seed, the recently placed heart shaped biopsy
marker clip in the 2 o'clock position of the right breast was
localized using a medial approach. The follow-up mammogram images
confirm the seed in the expected location and were marked for Dr.
JOHANNA.

Follow-up survey of the patient confirms presence of the radioactive
seed.

Order number of [V0] seed:  [PHONE_NUMBER].

Total activity:  0.251 mCi reference Date: [DATE]

The patient tolerated the procedures well and was released from the
[REDACTED]. She was given instructions regarding seed removal.
IMPRESSION: Radioactive seed localizations of the bilateral breasts. No apparent
complications.

## 2020-04-13 IMAGING — MG MM PLC BREAST LOC DEV 1ST LESION INC MAMMO GUIDE*L*
6 series · 6 of 6 positions shown · non-contrast
Comparison: Previous exam(s).

CLINICAL DATA: Recently diagnosed invasive mammary carcinoma in the
3 o'clock position of the left breast, marked with a coil shaped
biopsy marker clip. Recently diagnosed invasive mammary carcinoma in
the 2 o'clock position of the right breast, marked with a heart
shaped biopsy marker clip.

EXAM:
MAMMOGRAPHIC GUIDED RADIOACTIVE SEED LOCALIZATION OF THE LEFT BREAST
MAMMOGRAPHIC GUIDED RADIOACTIVE SEED LOCALIZATION OF THE RIGHT
BREAST

[L LM (1 of 3)]
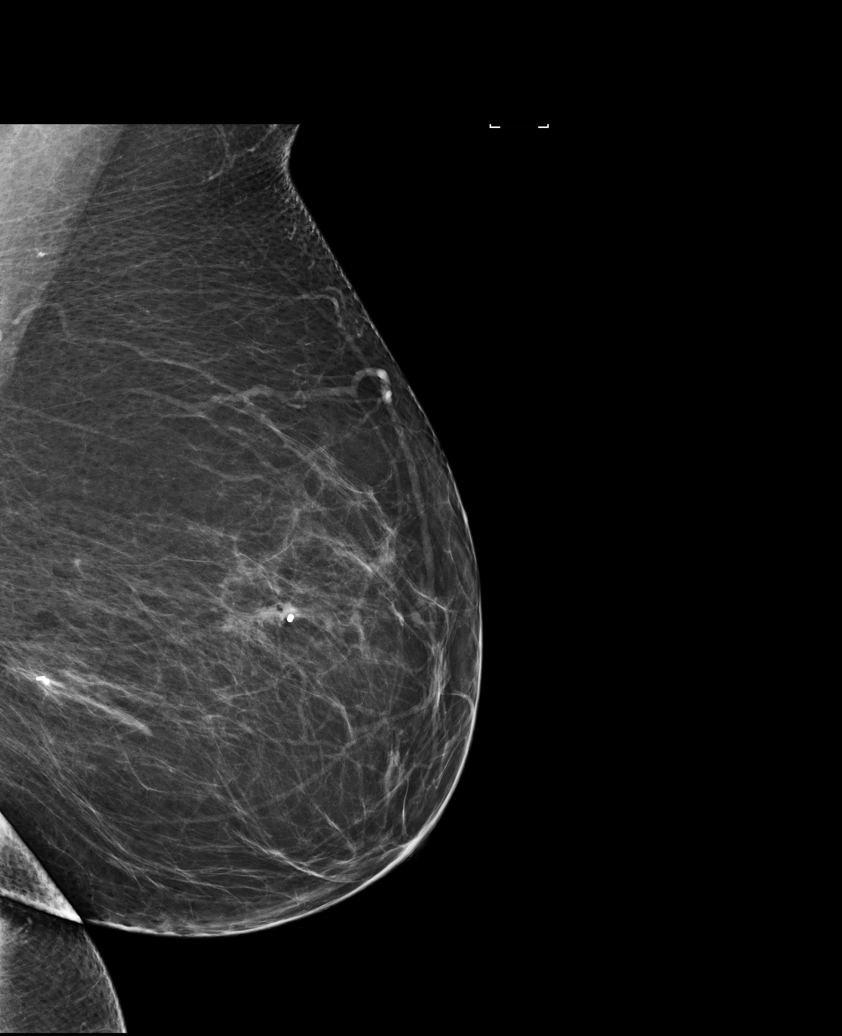

[L CC (1 of 3)]
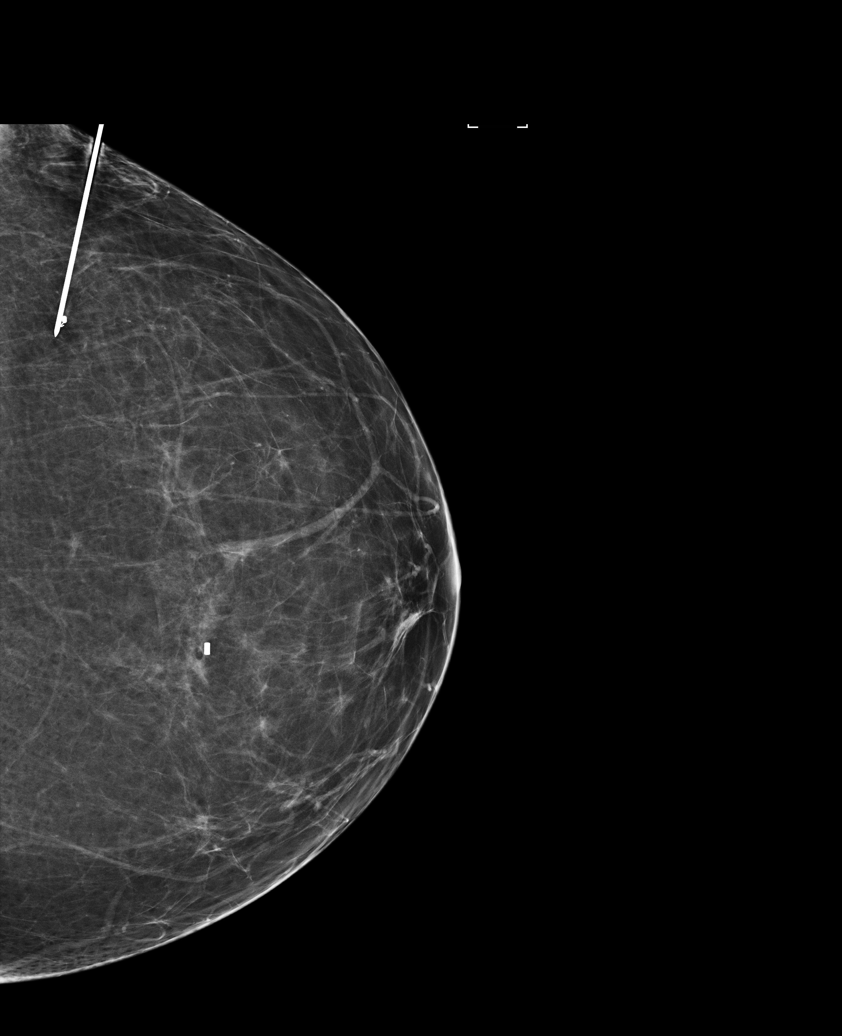

[L CC (2 of 3)]
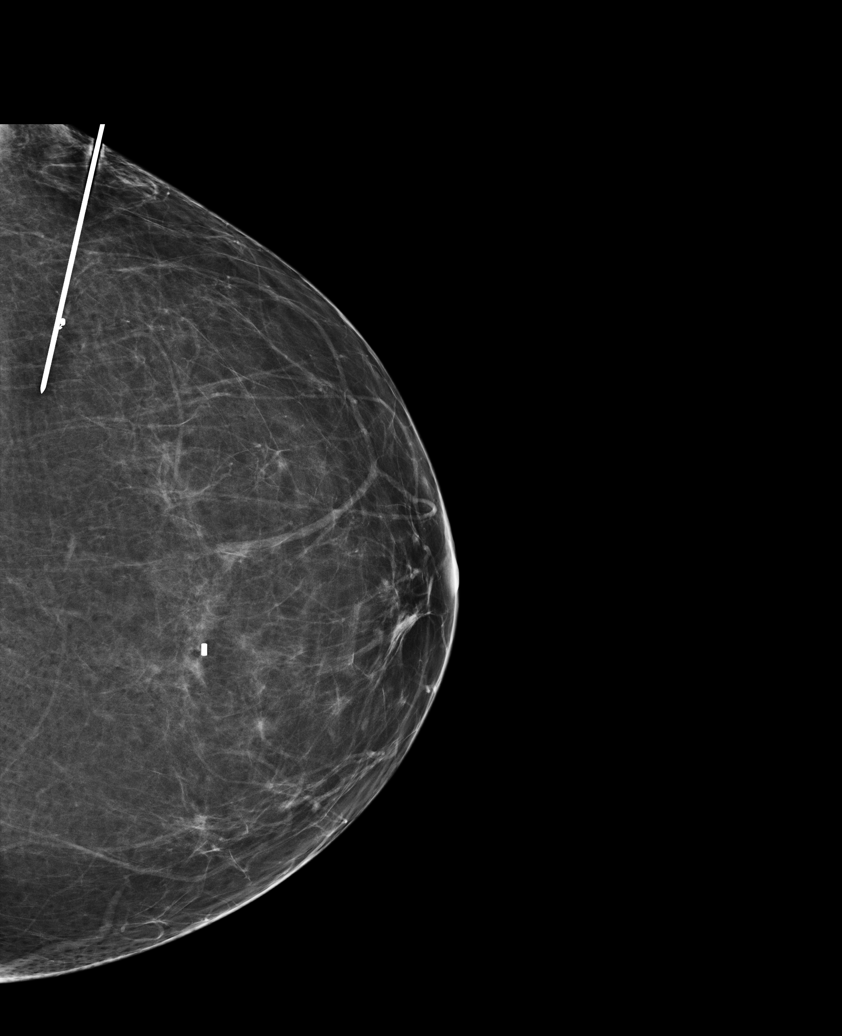

[L LM (2 of 3)]
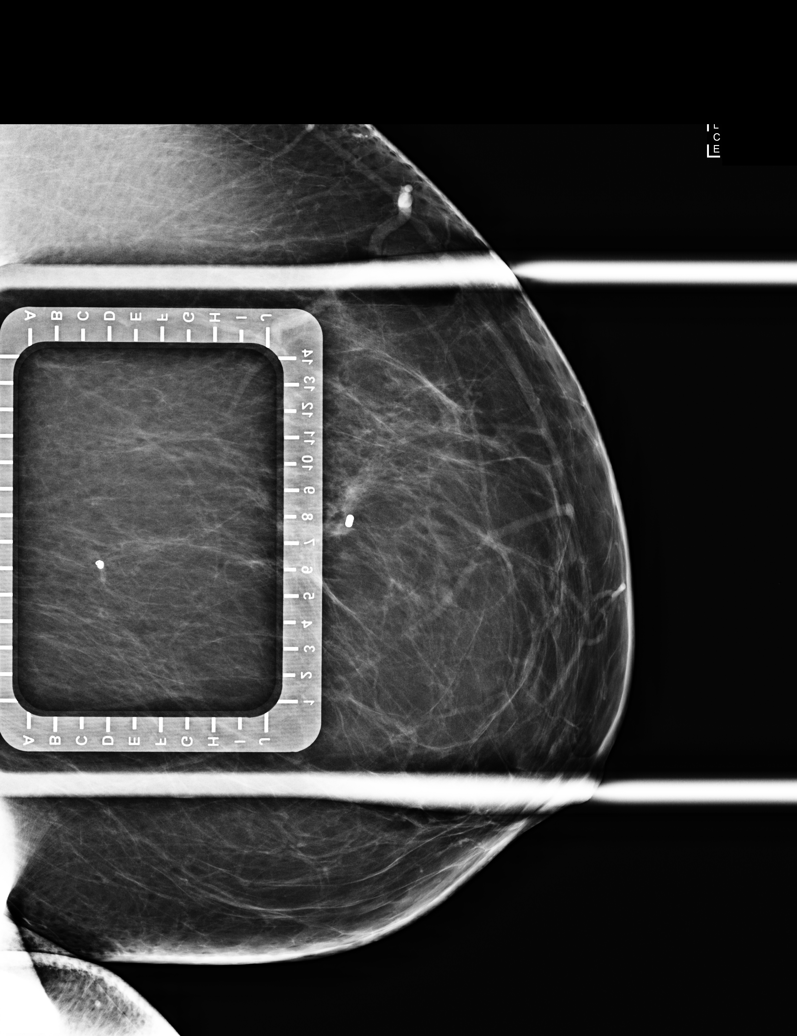

[L CC (3 of 3)]
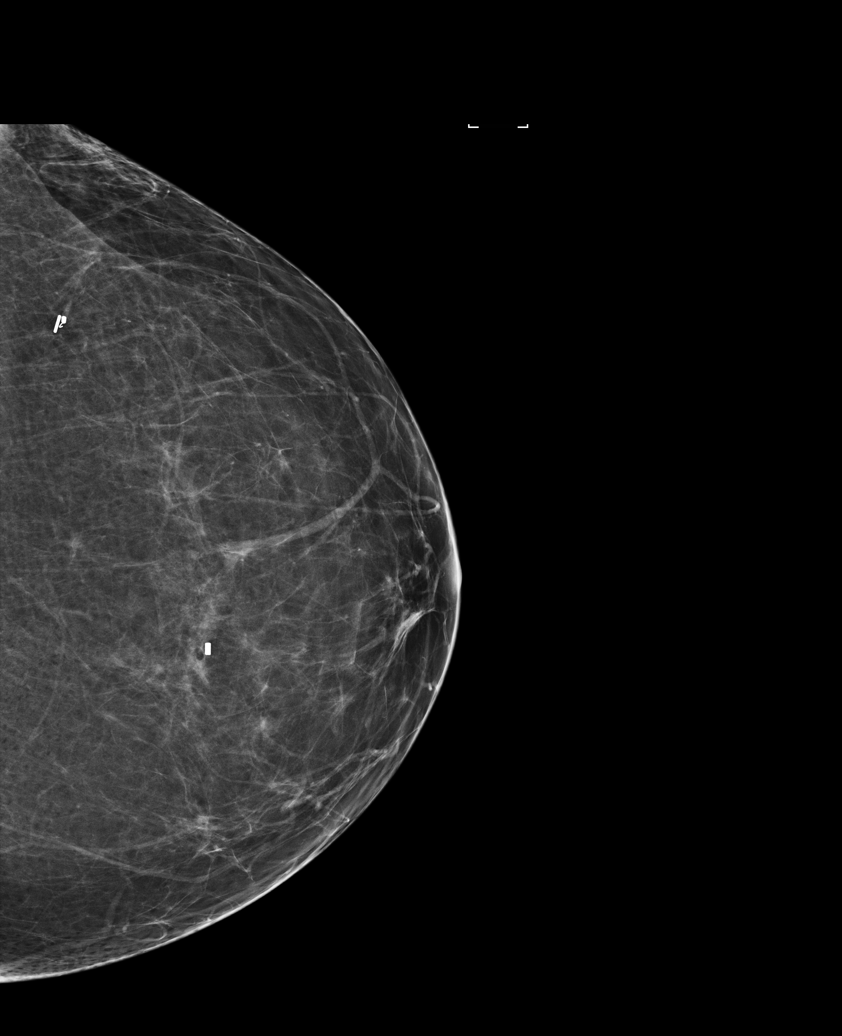

[L LM (3 of 3)]
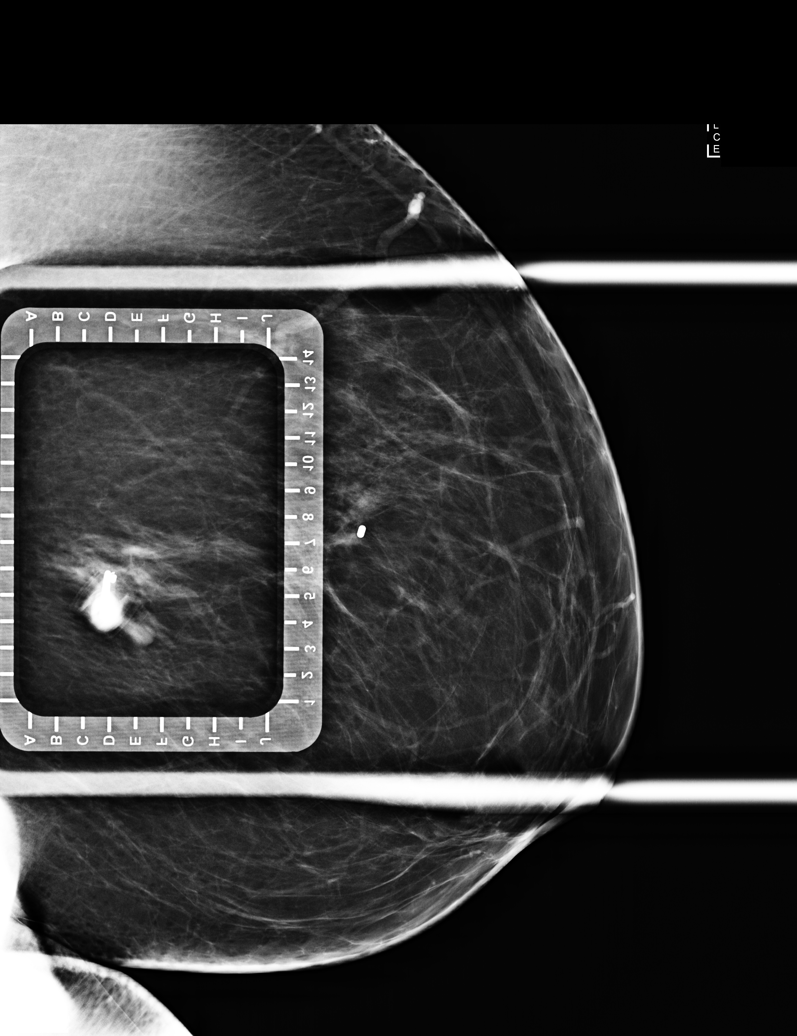

[6 of 6 positions shown; findings below may reference images not displayed]

FINDINGS: Patient presents for radioactive seed localization prior to
bilateral lumpectomies. I met with the patient and we discussed the
procedure of seed localization including benefits and alternatives.
We discussed the high likelihood of successful procedures. We
discussed the risks of the procedure including infection, bleeding,
tissue injury and further surgery. We discussed the low dose of
radioactivity involved in the procedures. Informed, written consent
was given.

The usual time-out protocol was performed immediately prior to the
procedures.

LEFT BREAST 3 O'CLOCK:

Using mammographic guidance, sterile technique, 1% lidocaine and an
[V0] radioactive seed, the recently placed coil shaped biopsy
marker clip in the 3 o'clock position of the left breast was
localized using a lateral approach. The follow-up mammogram images
confirm the seed in the expected location and were marked for Dr.
JOHANNA.

Follow-up survey of the patient confirms presence of the radioactive
seed.

Order number of [V0] seed:  [PHONE_NUMBER].

Total activity:  0.251 mCi reference Date: [DATE]

RIGHT BREAST 2 O'CLOCK:

Using mammographic guidance, sterile technique, 1% lidocaine and an
[V0] radioactive seed, the recently placed heart shaped biopsy
marker clip in the 2 o'clock position of the right breast was
localized using a medial approach. The follow-up mammogram images
confirm the seed in the expected location and were marked for Dr.
JOHANNA.

Follow-up survey of the patient confirms presence of the radioactive
seed.

Order number of [V0] seed:  [PHONE_NUMBER].

Total activity:  0.251 mCi reference Date: [DATE]

The patient tolerated the procedures well and was released from the
[REDACTED]. She was given instructions regarding seed removal.
IMPRESSION: Radioactive seed localizations of the bilateral breasts. No apparent
complications.

## 2020-04-13 NOTE — H&P (Signed)
Marissa Phillips  Location: [facility] Surgery Patient #: [id number] DOB: Apr 03, 1961 Married / Language: English / Race: White Female  History of Present Illness   The patient is a 59 year old female who presents with a complaint of brast cancer.  The PCP is Dr. Georgie Chard.  She comes with her husband, Marissa Phillips.  Emmett had a right breast cancer in 2005. She underwent a right lumpectomy with right axillary SLNBx. Her tumor was ER positive and Her2 neg. She had a micromet to her axillary lymph node. She went on a clinical trial with 4 cycles of AC, 4 cycles of taxol and gemcitibine, then 6 weeks of radiation tx. She went recently for her routine mammogram and was found to have new masses in both breast. She underwent bilateral breast biopsies that showed bilateral breast cancers. She has had genetics about 10 years ago and is BRCA 1 and 2 negative. We talked about updating her genetics.  Mammograms: 02/22/2020 - right breast UIQ, 2 o'clock, mass 0.6 x 0.5 cm, left breast, mass in the left breast at 3 o'clock 6 CMFN, irregular 0.5 x 0.5 cm mass Biopsy: She had bilateral breast biopsies on 03/01/2020. A. Right breast, 2:00, 5 CMFN - Invasive mammary breast cancer, B. Right axillary lymph node is negative, C. Left breast biopsy, 3:00, 6 CMFN, invasive mammary cancer with lobular features. Prognostic panel is pending for both cancers Family history of breast or ovarian cancer: No On hormone therapy: No  I discussed the options for breast cancer treatment with the patient. The treatment of breast cancer is multidisciplinary which includes medical oncology and radiation oncology. I discussed the surgical options of lumpectomy vs. mastectomy. If mastectomy, there is the possibility of reconstruction. I discussed the options of lymph node biopsy. The treatment plan depends on the pathologic staging of the tumor and the patient's  personal wishes. The risks of surgery include, but are not limited to, bleeding, infection, the need for further surgery, and nerve injury. The patient has been given literature on the treatment of breast cancer.  Plan: 1. Plan right breast lumpectomy.  Will plan a right axillary SLNBx. 2. She is a candidate for a left breast lumpectomy (seed localization) and left axillary SLNBx, 3. She will see Dr. Randa Evens for med onc and Dr. Donella Stade for rad onc, 4. because her left breast cancer is lobular, will get an MRI pre op  Past Medical History: 1. Second right breast cancer 2. Left breast cancer 3. Right breast cancer - 2005 treated with lumpectomy, SLNBx, chemotherapy, radiation tx 4. Colonoscopy - 2018 5. She had a laparoscopic oophorectomy in 2006 6. She has had irritable bowel syndrome - which is better 7. GERD  Social History: She comes with her husband, Marissa Phillips. 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 This requires moderate/high medical decision making. Total time spent with patient and charting: 60 minutes  Physical Exam  General: Overweight WF who is alert and generally healthy appearing. She is wearing a mask. HEENT: Normal. Pupils equal.  Neck: Supple. No mass. No thyroid mass.  Lymph Nodes: No supraclavicular or cervical nodes.  Lungs: Clear to auscultation and symmetric breath sounds. Heart: RRR. No murmur or rub.  Breasts: Right - she has a scar in the UOQ of the right breast. She has some post biopsy changes, but  no mass.   She has puncture site in the UIQ (for the axillary node), and LIQ. I feel no specific mass in the breast.  Left - No specific mass or nodule felt. She has the puncture wound in the breast.  Abdomen: Soft. No mass. No tenderness. No hernia.  Normal bowel sounds. No abdominal scars. Rectal: Not done.  Extremities: Good strength and ROM in upper and lower extremities.  Neurologic: Grossly intact to motor and sensory function. Psychiatric: Has normal mood and affect. Behavior is normal.   Assessment & Plan  1.  BREAST CANCER, RIGHT (C50.911) - ER - positive, PR - negative, Her2 - negative  Plan:  1. Right breast lumpecomty and right axillary SLNBx    Seen by Dr. Donella Stade who thinks that she can receive a boost to the lumpectomy site. 2.  BREAST CANCER, LEFT (C50.912) - ER - postive, PR - negative, Her2 - Negative  Plan:   1. Left breast lumpectomy with left axillary SLNBx   3. Saw Dr. Janese Banks, medical oncology, 4/12   4. Saw Dr. Donella Stade for rad oncology in [place]   3.  MRI shows areas of concern in both breasts  She had bilateral breast biopsy on 03/24/2020 - Left - fibrocystic disease, Right - fat necrosis  I talked to Dr. Shelly Bombard - she thought these were concordant    4.  HISTORY OF RIGHT BREAST CANCER (Z85.3) Story: Right lumpectomy, right axillary SLNBx, chemotx, and radiation - 2005  5. She had a laparoscopic oophorectomy in 2006 6. She has had irritable bowel syndrome - which is better 7. GERD  Alphonsa Overall, MD, [facility] Surgery Office phone:  [phone]

## 2020-04-14 ENCOUNTER — Encounter (HOSPITAL_COMMUNITY): Payer: Self-pay | Admitting: Surgery

## 2020-04-14 ENCOUNTER — Ambulatory Visit (HOSPITAL_COMMUNITY): Payer: 59 | Admitting: Physician Assistant

## 2020-04-14 ENCOUNTER — Ambulatory Visit
Admission: RE | Admit: 2020-04-14 | Discharge: 2020-04-14 | Disposition: A | Discharge status: Home / Self Care | Payer: 59 | Source: Ambulatory Visit | Attending: Surgery | Admitting: Surgery

## 2020-04-14 ENCOUNTER — Ambulatory Visit (HOSPITAL_COMMUNITY)
Admission: RE | Admit: 2020-04-14 | Discharge: 2020-04-14 | Disposition: A | Discharge status: Home / Self Care | Payer: 59 | Attending: Surgery | Admitting: Surgery

## 2020-04-14 ENCOUNTER — Ambulatory Visit (HOSPITAL_COMMUNITY)
Admission: RE | Admit: 2020-04-14 | Discharge: 2020-04-14 | Disposition: A | Discharge status: Home / Self Care | Payer: 59 | Source: Ambulatory Visit | Attending: Surgery | Admitting: Surgery

## 2020-04-14 ENCOUNTER — Other Ambulatory Visit: Payer: Self-pay

## 2020-04-14 ENCOUNTER — Ambulatory Visit (HOSPITAL_COMMUNITY): Payer: 59 | Admitting: Anesthesiology

## 2020-04-14 ENCOUNTER — Encounter (HOSPITAL_COMMUNITY)
Admission: RE | Disposition: A | Discharge status: Home / Self Care | Payer: Self-pay | Source: Home / Self Care | Attending: Surgery

## 2020-04-14 DIAGNOSIS — C50211 Malignant neoplasm of upper-inner quadrant of right female breast: Secondary | ICD-10-CM | POA: Insufficient documentation

## 2020-04-14 DIAGNOSIS — C50912 Malignant neoplasm of unspecified site of left female breast: Secondary | ICD-10-CM

## 2020-04-14 DIAGNOSIS — K219 Gastro-esophageal reflux disease without esophagitis: Secondary | ICD-10-CM | POA: Diagnosis not present

## 2020-04-14 DIAGNOSIS — E785 Hyperlipidemia, unspecified: Secondary | ICD-10-CM | POA: Diagnosis not present

## 2020-04-14 DIAGNOSIS — J45909 Unspecified asthma, uncomplicated: Secondary | ICD-10-CM | POA: Diagnosis not present

## 2020-04-14 DIAGNOSIS — C50812 Malignant neoplasm of overlapping sites of left female breast: Secondary | ICD-10-CM | POA: Diagnosis not present

## 2020-04-14 DIAGNOSIS — G8918 Other acute postprocedural pain: Secondary | ICD-10-CM | POA: Diagnosis not present

## 2020-04-14 DIAGNOSIS — C50911 Malignant neoplasm of unspecified site of right female breast: Secondary | ICD-10-CM

## 2020-04-14 DIAGNOSIS — C50512 Malignant neoplasm of lower-outer quadrant of left female breast: Secondary | ICD-10-CM | POA: Insufficient documentation

## 2020-04-14 DIAGNOSIS — K589 Irritable bowel syndrome without diarrhea: Secondary | ICD-10-CM | POA: Insufficient documentation

## 2020-04-14 DIAGNOSIS — Z170 Estrogen receptor positive status [ER+]: Secondary | ICD-10-CM

## 2020-04-14 HISTORY — PX: BREAST LUMPECTOMY WITH RADIOACTIVE SEED AND SENTINEL LYMPH NODE BIOPSY: SHX6550

## 2020-04-14 IMAGING — MG MM BREAST SURGICAL SPECIMEN
1 series · 1 of 1 positions shown · non-contrast
Comparison: Previous exam(s).

CLINICAL DATA: Evaluate specimen

EXAM:
SPECIMEN RADIOGRAPH OF THE LEFT BREAST

[L]
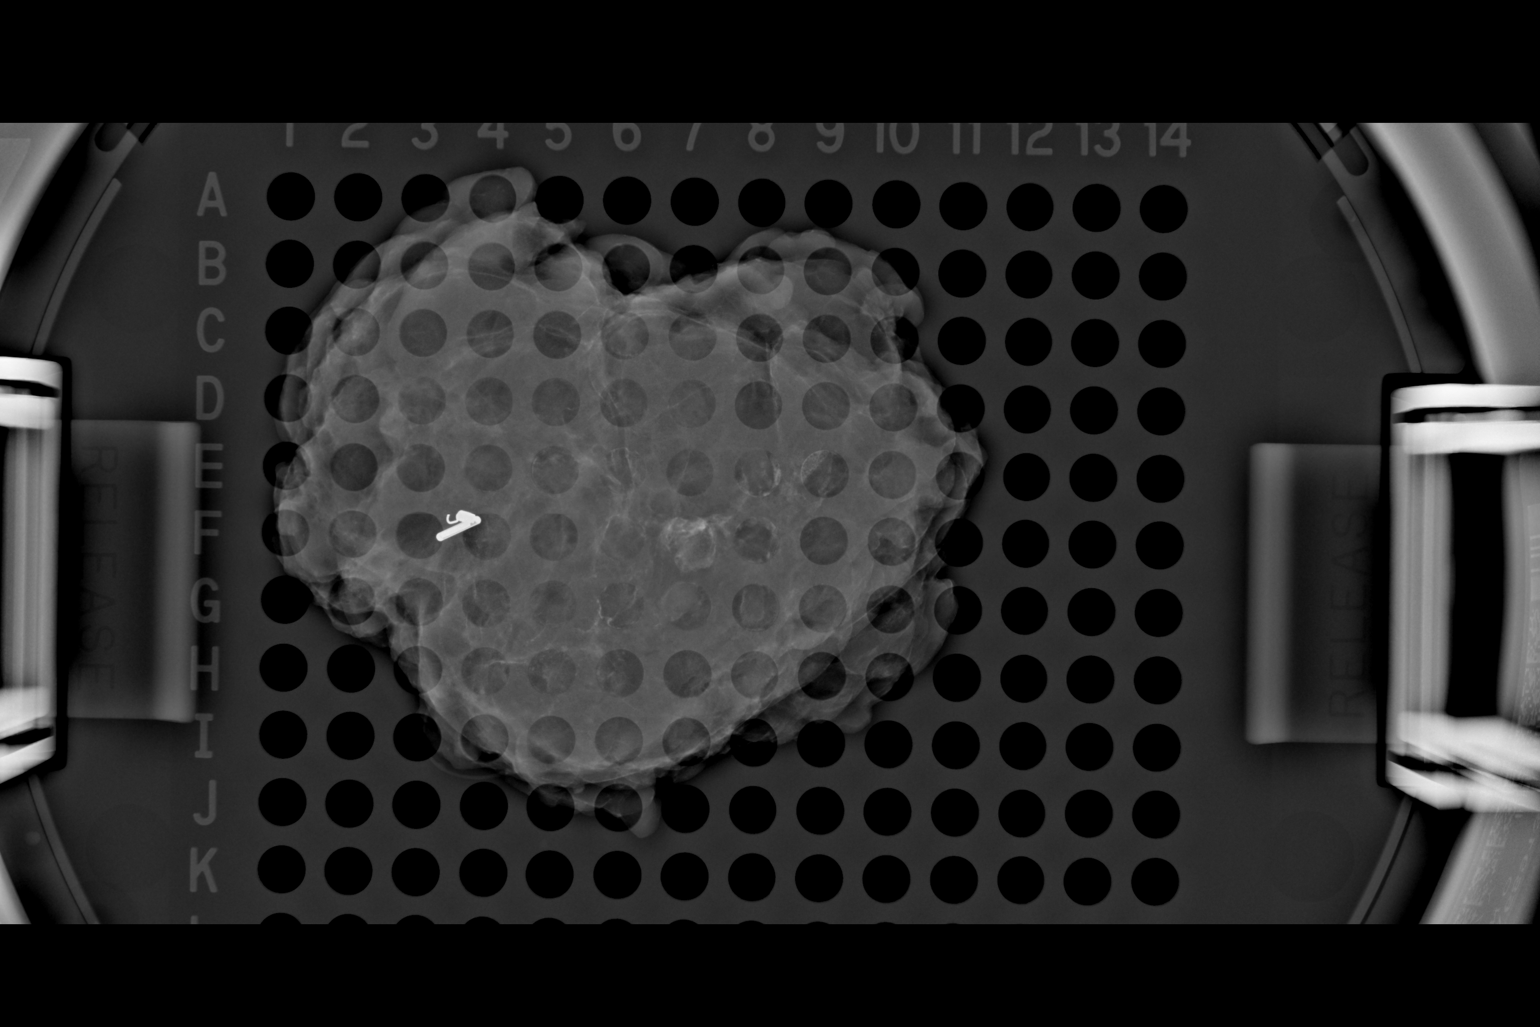

[1 of 1 positions shown; findings below may reference images not displayed]

FINDINGS: Status post excision of the left breast. The radioactive seed and
biopsy marker clip are present, completely intact, and were marked
for pathology.
IMPRESSION: Specimen radiograph of the left breast.

## 2020-04-14 IMAGING — MG MM BREAST SURGICAL SPECIMEN
1 series · 1 of 1 positions shown · non-contrast
Comparison: Previous exam(s).

CLINICAL DATA: Evaluate specimen

EXAM:
SPECIMEN RADIOGRAPH OF THE RIGHT BREAST

[R]
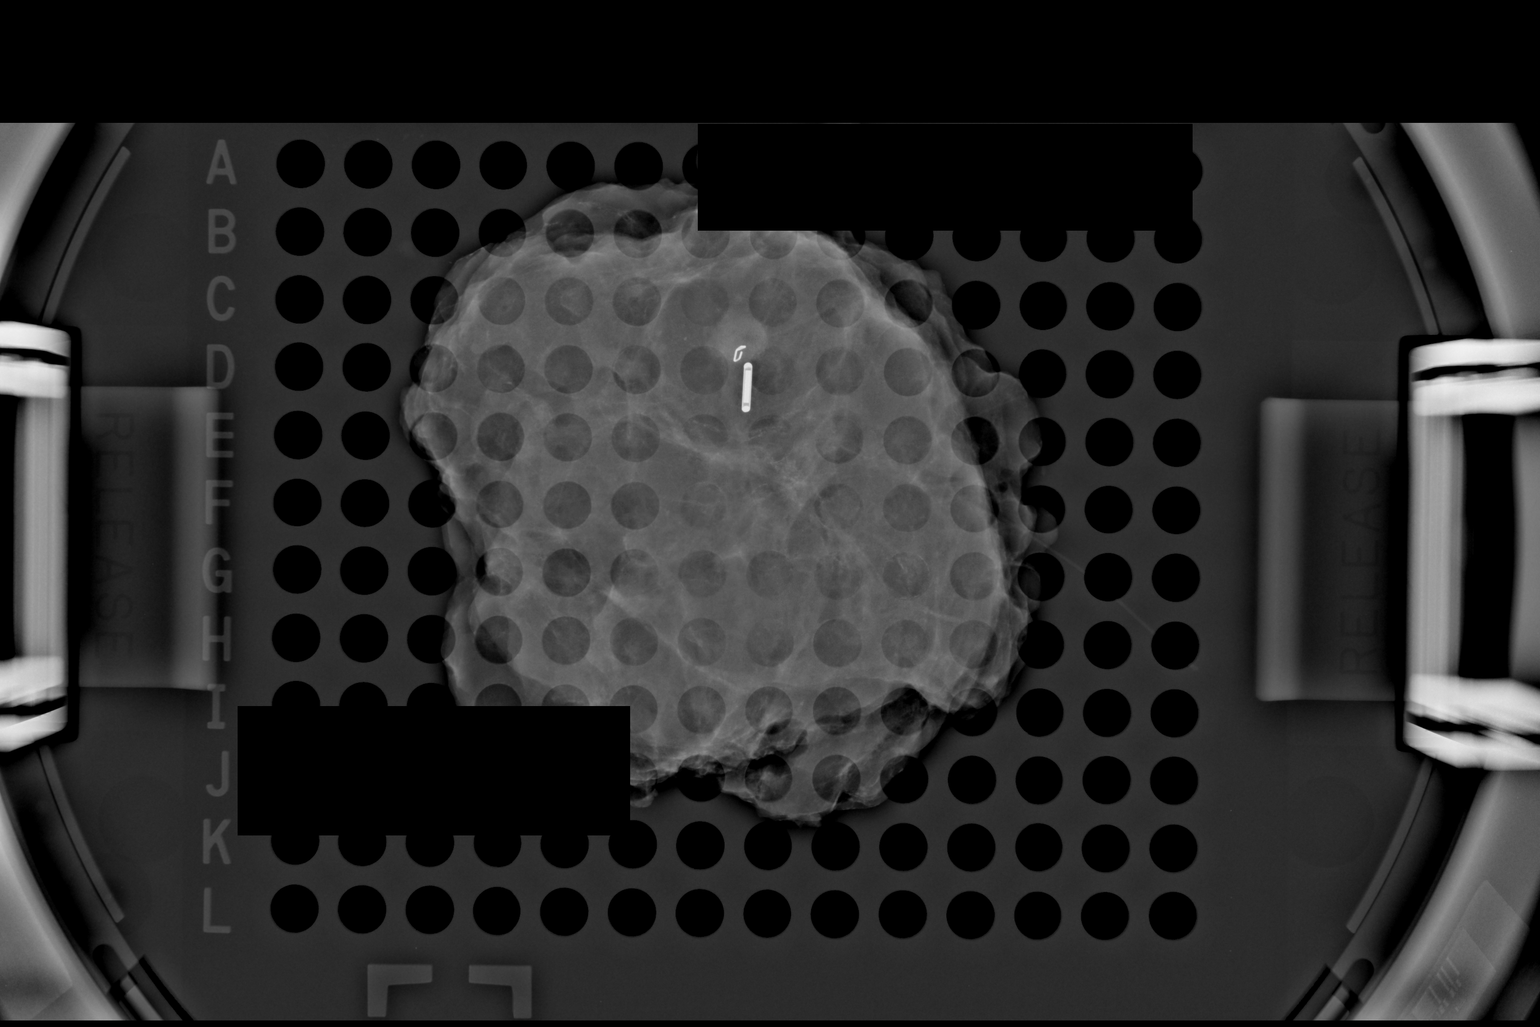

[1 of 1 positions shown; findings below may reference images not displayed]

FINDINGS: Status post excision of the right breast. The radioactive seed and
biopsy marker clip are present, completely intact, and were marked
for pathology.
IMPRESSION: Specimen radiograph of the right breast.

## 2020-04-14 SURGERY — BREAST LUMPECTOMY WITH RADIOACTIVE SEED AND SENTINEL LYMPH NODE BIOPSY
Anesthesia: Regional | Site: Breast | Laterality: Bilateral

## 2020-04-14 MED ORDER — 0.9 % SODIUM CHLORIDE (POUR BTL) OPTIME
TOPICAL | Status: DC | PRN
  Administered 2020-04-14: 1000 mL

## 2020-04-14 MED ORDER — FENTANYL CITRATE (PF) 100 MCG/2ML IJ SOLN: 25.0000 ug | INTRAMUSCULAR | Status: DC | PRN

## 2020-04-14 MED ORDER — ROCURONIUM BROMIDE 10 MG/ML (PF) SYRINGE
PREFILLED_SYRINGE | INTRAVENOUS | Status: AC
  Filled 2020-04-14: qty 10

## 2020-04-14 MED ORDER — ROPIVACAINE HCL 5 MG/ML IJ SOLN
INTRAMUSCULAR | Status: DC | PRN
  Administered 2020-04-14 (×2): 25 mL via PERINEURAL

## 2020-04-14 MED ORDER — SODIUM CHLORIDE (PF) 0.9 % IJ SOLN
INTRAVENOUS | Status: DC | PRN
  Administered 2020-04-14: 2 mL

## 2020-04-14 MED ORDER — SUGAMMADEX SODIUM 200 MG/2ML IV SOLN
INTRAVENOUS | Status: DC | PRN
Start: 2020-04-14 — End: 2020-04-14
  Administered 2020-04-14: 200 mg via INTRAVENOUS

## 2020-04-14 MED ORDER — ROCURONIUM BROMIDE 10 MG/ML (PF) SYRINGE
PREFILLED_SYRINGE | INTRAVENOUS | Status: DC | PRN
  Administered 2020-04-14: 20 mg via INTRAVENOUS
  Administered 2020-04-14: 100 mg via INTRAVENOUS
  Administered 2020-04-14: 20 mg via INTRAVENOUS

## 2020-04-14 MED ORDER — PHENYLEPHRINE 40 MCG/ML (10ML) SYRINGE FOR IV PUSH (FOR BLOOD PRESSURE SUPPORT)
PREFILLED_SYRINGE | INTRAVENOUS | Status: AC
  Filled 2020-04-14: qty 10

## 2020-04-14 MED ORDER — TECHNETIUM TC 99M SULFUR COLLOID FILTERED
1.0000 | Freq: Once | INTRAVENOUS | Status: AC | PRN
  Administered 2020-04-14: 1 via INTRADERMAL

## 2020-04-14 MED ORDER — MIDAZOLAM HCL 2 MG/2ML IJ SOLN: 2.0000 mg | Freq: Once | INTRAMUSCULAR | Status: AC

## 2020-04-14 MED ORDER — HYDROCODONE-ACETAMINOPHEN 5-325 MG PO TABS: 1.0000 | ORAL_TABLET | Freq: Once | ORAL | Status: DC

## 2020-04-14 MED ORDER — CHLORHEXIDINE GLUCONATE 4 % EX LIQD: 60.0000 mL | Freq: Once | CUTANEOUS | Status: DC

## 2020-04-14 MED ORDER — HYDROCODONE-ACETAMINOPHEN 5-325 MG PO TABS: 1.0000 | ORAL_TABLET | Freq: Four times a day (QID) | ORAL | 0 refills | Status: DC | PRN

## 2020-04-14 MED ORDER — SODIUM CHLORIDE (PF) 0.9 % IJ SOLN
INTRAMUSCULAR | Status: AC
  Filled 2020-04-14: qty 10

## 2020-04-14 MED ORDER — BUPIVACAINE HCL (PF) 0.25 % IJ SOLN
INTRAMUSCULAR | Status: AC
  Filled 2020-04-14: qty 60

## 2020-04-14 MED ORDER — ORAL CARE MOUTH RINSE: 15.0000 mL | Freq: Once | OROMUCOSAL | Status: AC

## 2020-04-14 MED ORDER — HYDROCODONE-ACETAMINOPHEN 5-325 MG PO TABS
ORAL_TABLET | ORAL | Status: AC
  Administered 2020-04-14: 1
  Filled 2020-04-14: qty 1

## 2020-04-14 MED ORDER — CHLORHEXIDINE GLUCONATE 0.12 % MT SOLN
15.0000 mL | Freq: Once | OROMUCOSAL | Status: AC
  Administered 2020-04-14: 15 mL via OROMUCOSAL
  Filled 2020-04-14: qty 15

## 2020-04-14 MED ORDER — PROPOFOL 10 MG/ML IV BOLUS
INTRAVENOUS | Status: DC | PRN
  Administered 2020-04-14: 150 mg via INTRAVENOUS

## 2020-04-14 MED ORDER — MIDAZOLAM HCL 2 MG/2ML IJ SOLN
INTRAMUSCULAR | Status: AC
  Administered 2020-04-14: 2 mg via INTRAVENOUS
  Filled 2020-04-14: qty 2

## 2020-04-14 MED ORDER — LIDOCAINE 2% (20 MG/ML) 5 ML SYRINGE
INTRAMUSCULAR | Status: AC
  Filled 2020-04-14: qty 5

## 2020-04-14 MED ORDER — FENTANYL CITRATE (PF) 100 MCG/2ML IJ SOLN: 50.0000 ug | Freq: Once | INTRAMUSCULAR | Status: AC

## 2020-04-14 MED ORDER — METHYLENE BLUE 0.5 % INJ SOLN
INTRAVENOUS | Status: AC
  Filled 2020-04-14: qty 10

## 2020-04-14 MED ORDER — SCOPOLAMINE 1 MG/3DAYS TD PT72
1.0000 | MEDICATED_PATCH | TRANSDERMAL | Status: DC
  Administered 2020-04-14: 1.5 mg via TRANSDERMAL
  Filled 2020-04-14: qty 1

## 2020-04-14 MED ORDER — FENTANYL CITRATE (PF) 100 MCG/2ML IJ SOLN
INTRAMUSCULAR | Status: AC
  Administered 2020-04-14: 50 ug via INTRAVENOUS
  Filled 2020-04-14: qty 2

## 2020-04-14 MED ORDER — CEFAZOLIN SODIUM-DEXTROSE 2-4 GM/100ML-% IV SOLN
2.0000 g | INTRAVENOUS | Status: AC
  Administered 2020-04-14: 2 g via INTRAVENOUS
  Filled 2020-04-14: qty 100

## 2020-04-14 MED ORDER — LACTATED RINGERS IV SOLN: INTRAVENOUS | Status: DC

## 2020-04-14 MED ORDER — EPHEDRINE 5 MG/ML INJ
INTRAVENOUS | Status: AC
  Filled 2020-04-14: qty 10

## 2020-04-14 MED ORDER — PHENYLEPHRINE HCL-NACL 10-0.9 MG/250ML-% IV SOLN
INTRAVENOUS | Status: DC | PRN
  Administered 2020-04-14: 35 ug/min via INTRAVENOUS

## 2020-04-14 MED ORDER — DEXAMETHASONE SODIUM PHOSPHATE 10 MG/ML IJ SOLN
INTRAMUSCULAR | Status: AC
  Filled 2020-04-14: qty 1

## 2020-04-14 MED ORDER — EPHEDRINE SULFATE 50 MG/ML IJ SOLN
INTRAMUSCULAR | Status: DC | PRN
  Administered 2020-04-14 (×2): 10 mg via INTRAVENOUS

## 2020-04-14 MED ORDER — ACETAMINOPHEN 500 MG PO TABS
1000.0000 mg | ORAL_TABLET | ORAL | Status: AC
  Administered 2020-04-14: 1000 mg via ORAL
  Filled 2020-04-14: qty 2

## 2020-04-14 MED ORDER — DEXAMETHASONE SODIUM PHOSPHATE 10 MG/ML IJ SOLN
INTRAMUSCULAR | Status: DC | PRN
  Administered 2020-04-14 (×2): 5 mg

## 2020-04-14 MED ORDER — ONDANSETRON HCL 4 MG/2ML IJ SOLN
INTRAMUSCULAR | Status: AC
  Filled 2020-04-14: qty 2

## 2020-04-14 MED ORDER — STERILE WATER FOR IRRIGATION IR SOLN
Status: DC | PRN
  Administered 2020-04-14: 1000 mL

## 2020-04-14 MED ORDER — FENTANYL CITRATE (PF) 250 MCG/5ML IJ SOLN
INTRAMUSCULAR | Status: AC
  Filled 2020-04-14: qty 5

## 2020-04-14 MED ORDER — PHENYLEPHRINE HCL (PRESSORS) 10 MG/ML IV SOLN
INTRAVENOUS | Status: DC | PRN
  Administered 2020-04-14: 80 ug via INTRAVENOUS

## 2020-04-14 MED ORDER — DEXAMETHASONE SODIUM PHOSPHATE 10 MG/ML IJ SOLN
INTRAMUSCULAR | Status: DC | PRN
  Administered 2020-04-14: 4 mg via INTRAVENOUS

## 2020-04-14 MED ORDER — MIDAZOLAM HCL 2 MG/2ML IJ SOLN
INTRAMUSCULAR | Status: AC
  Filled 2020-04-14: qty 2

## 2020-04-14 MED ORDER — FENTANYL CITRATE (PF) 100 MCG/2ML IJ SOLN
INTRAMUSCULAR | Status: AC
  Filled 2020-04-14: qty 2

## 2020-04-14 SURGICAL SUPPLY — 39 items
BINDER BREAST LRG (GAUZE/BANDAGES/DRESSINGS) IMPLANT
BINDER BREAST XLRG (GAUZE/BANDAGES/DRESSINGS) IMPLANT
CANISTER SUCT 3000ML PPV (MISCELLANEOUS) ×2 IMPLANT
CHLORAPREP W/TINT 26 (MISCELLANEOUS) ×6 IMPLANT
CLIP VESOCCLUDE SM WIDE 6/CT (CLIP) ×2 IMPLANT
COVER PROBE W GEL 5X96 (DRAPES) ×2 IMPLANT
COVER SURGICAL LIGHT HANDLE (MISCELLANEOUS) ×2 IMPLANT
COVER WAND RF STERILE (DRAPES) ×2 IMPLANT
DECANTER SPIKE VIAL GLASS SM (MISCELLANEOUS) ×2 IMPLANT
DERMABOND ADVANCED (GAUZE/BANDAGES/DRESSINGS) ×2
DERMABOND ADVANCED .7 DNX12 (GAUZE/BANDAGES/DRESSINGS) ×2 IMPLANT
DEVICE DUBIN SPECIMEN MAMMOGRA (MISCELLANEOUS) ×4 IMPLANT
DRAPE CHEST BREAST 15X10 FENES (DRAPES) ×2 IMPLANT
DRAPE HALF SHEET 40X57 (DRAPES) ×4 IMPLANT
ELECT COATED BLADE 2.86 ST (ELECTRODE) ×2 IMPLANT
ELECT REM PT RETURN 9FT ADLT (ELECTROSURGICAL) ×2
ELECTRODE REM PT RTRN 9FT ADLT (ELECTROSURGICAL) ×1 IMPLANT
GAUZE SPONGE 4X4 12PLY STRL (GAUZE/BANDAGES/DRESSINGS) IMPLANT
GOWN STRL REUS W/ TWL LRG LVL3 (GOWN DISPOSABLE) ×1 IMPLANT
GOWN STRL REUS W/ TWL XL LVL3 (GOWN DISPOSABLE) ×1 IMPLANT
GOWN STRL REUS W/TWL LRG LVL3 (GOWN DISPOSABLE) ×1
GOWN STRL REUS W/TWL XL LVL3 (GOWN DISPOSABLE) ×1
ILLUMINATOR WAVEGUIDE N/F (MISCELLANEOUS) IMPLANT
KIT BASIN OR (CUSTOM PROCEDURE TRAY) ×2 IMPLANT
KIT MARKER MARGIN INK (KITS) ×2 IMPLANT
LIGHT WAVEGUIDE WIDE FLAT (MISCELLANEOUS) IMPLANT
NEEDLE 18GX1X1/2 (RX/OR ONLY) (NEEDLE) ×2 IMPLANT
NEEDLE FILTER BLUNT 18X 1/2SAF (NEEDLE) ×1
NEEDLE FILTER BLUNT 18X1 1/2 (NEEDLE) ×1 IMPLANT
NEEDLE HYPO 25GX1X1/2 BEV (NEEDLE) ×4 IMPLANT
NS IRRIG 1000ML POUR BTL (IV SOLUTION) ×2 IMPLANT
PACK GENERAL/GYN (CUSTOM PROCEDURE TRAY) ×2 IMPLANT
RETRACTOR ONETRAX LX 90X20 (MISCELLANEOUS) ×2 IMPLANT
STAPLER VISISTAT 35W (STAPLE) ×2 IMPLANT
SUT MNCRL AB 4-0 PS2 18 (SUTURE) ×6 IMPLANT
SUT VIC AB 3-0 SH 8-18 (SUTURE) ×6 IMPLANT
SYR CONTROL 10ML LL (SYRINGE) ×6 IMPLANT
TOWEL GREEN STERILE (TOWEL DISPOSABLE) ×2 IMPLANT
TOWEL GREEN STERILE FF (TOWEL DISPOSABLE) ×2 IMPLANT

## 2020-04-14 NOTE — Discharge Instructions (Signed)
[facility] - DISCHARGE INSTRUCTIONS TO PATIENT  Activity:  Driving - May drive in 2 to 4 days, if off pain meds   Lifting - No lifting more than 15 pounds for 5 days, then no limits                       Practice your Covid-19 protection:  Wear a mask, social distance, and wash your hands frequently  Wound Care:   Leave the incision dry for 2 days, then you may remove breast binder and shower  Diet:  As tolerated  Follow up appointment:  Call Dr. Pollie Friar office [facility]) at [phone] for an appointment in 2 to 3 weeks..  Medications and dosages:  Resume your home medications.  You have a prescription for:  Vicodini  Call Dr. Lucia Gaskins or his office  [phone]) if you have:  Temperature greater than 100.4,  Persistent nausea and vomiting,  Severe uncontrolled pain,  Redness, tenderness, or signs of infection (pain, swelling, redness, odor or green/yellow discharge around the site),  Difficulty breathing, headache or visual disturbances,  Any other questions or concerns you may have after discharge.  In an emergency, call 911 or go to an Emergency Department at a nearby hospital.

## 2020-04-14 NOTE — Anesthesia Procedure Notes (Signed)
Anesthesia Regional Block: Pectoralis block   Pre-Anesthetic Checklist: ,, timeout performed, Correct Patient, Correct Site, Correct Laterality, Correct Procedure, Correct Position, site marked, Risks and benefits discussed,  Surgical consent,  Pre-op evaluation,  At surgeon's request and post-op pain management  Laterality: Left  Prep: Maximum Sterile Barrier Precautions used, chloraprep       Needles:  Injection technique: Single-shot  Needle Type: Echogenic Stimulator Needle     Needle Length: 9cm  Needle Gauge: 21     Additional Needles:   Procedures:,,,, ultrasound used (permanent image in chart),,,,  Narrative:  Start time: 04/14/2020 8:50 AM End time: 04/14/2020 9:00 AM Injection made incrementally with aspirations every 5 mL.  Performed by: Personally  Anesthesiologist: Freddrick March, MD  Additional Notes: Monitors applied. No increased pain on injection. No increased resistance to injection. Injection made in 5cc increments. Good needle visualization. Patient tolerated procedure well.

## 2020-04-14 NOTE — Interval H&P Note (Signed)
History and Physical Interval Note:  04/14/2020 9:33 AM  Marissa Phillips  has presented today for surgery, with the diagnosis of BILATERAL BREAST CANCER.  The various methods of treatment have been discussed with the patient and family.   She has bilateral seeds in place.  After consideration of risks, benefits and other options for treatment, the patient has consented to  Procedure(s): BILATERAL BREAST LUMPECTOMY WITH RADIOACTIVE SEED AND BILATERAL AXILLARY SENTINEL LYMPH NODE BIOPSY (Bilateral) as a surgical intervention.  The patient's history has been reviewed, patient examined, no change in status, stable for surgery.  I have reviewed the patient's chart and labs.  Questions were answered to the patient's satisfaction.     Shann Medal

## 2020-04-14 NOTE — Anesthesia Postprocedure Evaluation (Signed)
Anesthesia Post Note  Patient: Marissa Phillips  Procedure(s) Performed: BILATERAL RADIOACTIVE SEED GUIDED BREAST LUMPECTOMIES, BILATERAL AXILLARY SENTINEL LYMPH NODE BIOPSIES, WITH BLUE DYE INJECTION RIGHT BREAST (Bilateral Breast)     Patient location during evaluation: PACU Anesthesia Type: Regional and General Level of consciousness: awake and alert Pain management: pain level controlled Vital Signs Assessment: post-procedure vital signs reviewed and stable Respiratory status: spontaneous breathing, nonlabored ventilation, respiratory function stable and patient connected to nasal cannula oxygen Cardiovascular status: blood pressure returned to baseline and stable Postop Assessment: no apparent nausea or vomiting Anesthetic complications: no    Last Vitals:  Vitals:   04/14/20 1330 04/14/20 1403  BP: 116/70 132/77  Pulse: 90 78  Resp: 13 13  Temp: 36.7 C 36.6 C  SpO2: 100%     Last Pain:  Vitals:   04/14/20 1403  TempSrc:   PainSc: 2                  Aria Pickrell L Joliet Mallozzi

## 2020-04-14 NOTE — Anesthesia Preprocedure Evaluation (Addendum)
Anesthesia Evaluation  Patient identified by MRN, date of birth, ID band Patient awake    Reviewed: Allergy & Precautions, NPO status , Patient's Chart, lab work & pertinent test results  History of Anesthesia Complications (+) PONV  Airway Mallampati: II  TM Distance: >3 FB Neck ROM: Full    Dental no notable dental hx. (+) Teeth Intact, Dental Advisory Given   Pulmonary asthma ,    Pulmonary exam normal breath sounds clear to auscultation       Cardiovascular negative cardio ROS Normal cardiovascular exam Rhythm:Regular Rate:Normal  HLD   Neuro/Psych negative neurological ROS  negative psych ROS   GI/Hepatic Neg liver ROS, GERD  Medicated,  Endo/Other  negative endocrine ROS  Renal/GU negative Renal ROS  negative genitourinary   Musculoskeletal negative musculoskeletal ROS (+)   Abdominal   Peds  Hematology negative hematology ROS (+)   Anesthesia Other Findings   Reproductive/Obstetrics                            Anesthesia Physical Anesthesia Plan  ASA: II  Anesthesia Plan: General and Regional   Post-op Pain Management:  Regional for Post-op pain   Induction: Intravenous  PONV Risk Score and Plan: 4 or greater and Ondansetron, Dexamethasone, Midazolam, Treatment may vary due to age or medical condition and Scopolamine patch - Pre-op  Airway Management Planned: Oral ETT  Additional Equipment:   Intra-op Plan:   Post-operative Plan: Extubation in OR  Informed Consent: I have reviewed the patients History and Physical, chart, labs and discussed the procedure including the risks, benefits and alternatives for the proposed anesthesia with the patient or authorized representative who has indicated his/her understanding and acceptance.     Dental advisory given  Plan Discussed with: CRNA  Anesthesia Plan Comments:        Anesthesia Quick Evaluation

## 2020-04-14 NOTE — Transfer of Care (Signed)
Immediate Anesthesia Transfer of Care Note  Patient: Marissa Phillips  Procedure(s) Performed: BILATERAL RADIOACTIVE SEED GUIDED BREAST LUMPECTOMIES, BILATERAL AXILLARY SENTINEL LYMPH NODE BIOPSIES, WITH BLUE DYE INJECTION RIGHT BREAST (Bilateral Breast)  Patient Location: PACU  Anesthesia Type:General and Regional  Level of Consciousness: awake, alert , oriented and sedated  Airway & Oxygen Therapy: Patient Spontanous Breathing  Post-op Assessment: Report given to RN, Post -op Vital signs reviewed and stable and Patient moving all extremities  Post vital signs: Reviewed and stable  Last Vitals:  Vitals Value Taken Time  BP 116/70 04/14/20 1327  Temp    Pulse 93 04/14/20 1330  Resp 27 04/14/20 1330  SpO2 99 % 04/14/20 1330  Vitals shown include unvalidated device data.  Last Pain:  Vitals:   04/14/20 0919  TempSrc:   PainSc: 0-No pain      Patients Stated Pain Goal: 3 ([id number])  Complications: No apparent anesthesia complications

## 2020-04-14 NOTE — Anesthesia Procedure Notes (Signed)
Anesthesia Regional Block: Pectoralis block   Pre-Anesthetic Checklist: ,, timeout performed, Correct Patient, Correct Site, Correct Laterality, Correct Procedure, Correct Position, site marked, Risks and benefits discussed,  Surgical consent,  Pre-op evaluation,  At surgeon's request and post-op pain management  Laterality: Right  Prep: Maximum Sterile Barrier Precautions used, chloraprep       Needles:  Injection technique: Single-shot  Needle Type: Echogenic Stimulator Needle     Needle Length: 9cm  Needle Gauge: 21     Additional Needles:   Procedures:,,,, ultrasound used (permanent image in chart),,,,  Narrative:  Start time: 04/14/2020 9:00 AM End time: 04/14/2020 9:10 AM Injection made incrementally with aspirations every 5 mL.  Performed by: Personally  Anesthesiologist: Freddrick March, MD  Additional Notes: Monitors applied. No increased pain on injection. No increased resistance to injection. Injection made in 5cc increments. Good needle visualization. Patient tolerated procedure well.

## 2020-04-14 NOTE — Anesthesia Procedure Notes (Addendum)
Procedure Name: Intubation Date/Time: 04/14/2020 10:11 AM Performed by: Scheryl Darter, CRNA Pre-anesthesia Checklist: Patient identified, Emergency Drugs available, Suction available and Patient being monitored Patient Re-evaluated:Patient Re-evaluated prior to induction Oxygen Delivery Method: Circle System Utilized Preoxygenation: Pre-oxygenation with 100% oxygen Induction Type: IV induction Ventilation: Mask ventilation without difficulty Laryngoscope Size: Miller and 2 Grade View: Grade I Tube type: Oral Tube size: 7.0 mm Number of attempts: 1 Airway Equipment and Method: Stylet Placement Confirmation: ETT inserted through vocal cords under direct vision,  positive ETCO2 and breath sounds checked- equal and bilateral Secured at: 23 cm Tube secured with: Tape Dental Injury: Teeth and Oropharynx as per pre-operative assessment

## 2020-04-14 NOTE — Op Note (Signed)
04/14/2020  5:43 PM  PATIENT:  Marissa Phillips DOB: February 28, 1961 MRN: [id number]  PREOP DIAGNOSIS:   BILATERAL BREAST CANCER  POSTOP DIAGNOSIS:    Right breast cancer, 2 o'clock position (T1, N0)  Left breast cancer, 5 o'cock (T1, N0)  PROCEDURE:   Procedure(s): BILATERAL RADIOACTIVE SEED GUIDED BREAST LUMPECTOMIES, BILATERAL AXILLARY SENTINEL LYMPH NODE BIOPSIES, WITH BLUE DYE INJECTION RIGHT BREAST, Injection of peri areolar area of the right breast with methylene blue (1.0 cc), deep sentinel lymph node biopsy  SURGEON:   Alphonsa Overall, M.D.  ANESTHESIA:   General  Anesthesiologist: Freddrick March, MD CRNA: Wilburn Cornelia, CRNA; Scheryl Darter, CRNA  General  EBL:  100  ml  DRAINS:  none   LOCAL MEDICATIONS USED:   Bilateral pectoral block  SPECIMEN:   Right breast lumpectomy (6 color paint), right lumpectomy superior margin, right lumpectomy medial margin, right axillary tissue biopsy         Left breast lumpectomy (6 color paint), left lumpectomy medial margin, left lumpectomy superior margin, left axillary sentinel lymph node biopsy (counts 180, background 5)  COUNTS CORRECT:  YES  INDICATIONS FOR PROCEDURE:  Marissa Phillips is a 59 y.o. (DOB: 10/14/1961) white female whose primary care physician is Idelle Crouch, MD and comes for bilateral breast lumpectomy and bilateral axillary sentinel lymph node biopsy.   She had a prior right breast cancer in 2005.  She now has a new right breast cancer and a left breast cancer.  She has seen Drs.Roosvelt Maser and [place] for oncology.  The options for breast cancer treatment have been discussed with the patient. She elected to proceed with lumpectomy and axillary sentinel lymph node.     The indications and potential complications of surgery were explained to the patient. Potential complications include, but are not limited to, bleeding, infection, the need for further surgery, and nerve injury.     She had a I131 seed placed on  04/13/2020 in her both her breast at [facility].  The seed is in the 2 o'clock position of the right breast and in the 5 o'clock position of the left breast (near the left inframammary fold).   In the holding area, both her areola was injected with 1 millicurie of Technitium Sulfur Colloid.  OPERATIVE NOTE:   The patient was taken to operating room # 9 at [facility] where she underwent a general anesthesia  supervised by Anesthesiologist: Freddrick March, MD CRNA: Wilburn Cornelia, CRNA; Scheryl Darter, CRNA. Both her breast and axilla were prepped with  ChloraPrep and sterilely draped.    A time-out was held and the surgical check list was reviewed.    I injected about 1.0 mL of 40% methylene blue around her right areola.  The methylene blue filled up lymphatics going medially.  I think her prior right breast surgery (UOQ lumpectomy and right axillary sentinel lymph node biopsy) has changed the lymphatic drainage of the breast.   The right breast cancer was about at the 2 o'clock position of the right breast.   It was 3 cm from the right areola.  I made a medially based circumareolar incision.  I used the Neoprobe to identify the I131 seed.  I tried to excise an area around the tumor of at least 1 cm.    I excised this block of breast tissue approximately 3 cm by 4 cm  in diameter.   I did go down to the chest wall.  I painted the lumpectomy specimen with the 6 color paint kit and did a specimen mammogram which confirmed the mass, clip, and the seed were all in the right position in the specimen.  The specimen was sent to pathology who called back to confirm that they have the seed and the specimen.   On the right lumpectomy specimen mammogram, I thought the tumor near the superior margin.  So I took additional superior and medial margins.  These were labeled separately.   I then started the right deep axillary sentinel lymph node biopsy. I made an incision in the right axilla.  She had  neither evidence of a radioisotope tracer or blue dye in the right axilla.  I explored the axilla and saw no obvious adenopathy.  I removed some tissue from the axilla, but I don't think that it contains lymphatic tissue.  I could not see doing a further dissection and stopped at this point.       The left breast cancer was about at the 5 o'clock position of the left  breast.   It was almost at the left inframammary fold.  I used the Neoprobe to identify the I131 seed.  I tried to excise an area around the tumor of at least 1 cm.  I excised this block of breast tissue approximately 4 cm by 4 cm  in diameter.  I did take the excision down to the chest wall.  I painted the lumpectomy specimen with the 6 color paint kit and did a specimen mammogram which confirmed the mass, clip, and the seed were all in the right position in the specimen.  The specimen was sent to pathology who called back to confirm that they have the seed and the specimen.   On the left lumpectomy specimen mammogram, I thought the tumor near the medial margin.  So I took additional superior and medial margins.  These were labeled separately.   I then started the left deep axillary sentinel lymph node biopsy. I made an incision in the left axilla.  I found a hot area at the junction of the breast and the pectoralis major muscle, deep in the axilla. I cut down and  identified a hot node that had counts of 180 and the background has 5 counts.  The axillary node was then sent to pathology.    I then irrigated the wounds with saline. She had had bilateral pectoral blocks, so I used no local anesthetic.   I placed 4 clips to mark biopsy cavity, at 12, 3, 6, and 9 o'clock in each of the lumpectomy sites.  I then closed all the wounds in layers using 3-0 Vicryl sutures for the deep layer. At the skin, I closed the incisions with a 4-0 Monocryl suture. The incisions were then painted with Dermabond.  She had gauze place over the wounds and placed in  a breast binder.   The patient tolerated the procedure well, was transported to the recovery room in good condition. Sponge and needle count were correct at the end of the case.   Final pathology is pending.   Alphonsa Overall, MD, [facility] Pager: [phone] Office phone:  [phone]

## 2020-04-20 LAB — SURGICAL PATHOLOGY

## 2020-04-22 ENCOUNTER — Encounter: Payer: Self-pay | Admitting: *Deleted

## 2020-04-28 ENCOUNTER — Ambulatory Visit: Payer: 59 | Admitting: Radiation Oncology

## 2020-04-28 ENCOUNTER — Inpatient Hospital Stay: Payer: 59 | Admitting: Oncology

## 2020-05-10 DIAGNOSIS — R87615 Unsatisfactory cytologic smear of cervix: Secondary | ICD-10-CM | POA: Diagnosis not present

## 2020-05-10 DIAGNOSIS — Z124 Encounter for screening for malignant neoplasm of cervix: Secondary | ICD-10-CM | POA: Diagnosis not present

## 2020-05-11 ENCOUNTER — Telehealth: Payer: Self-pay | Admitting: *Deleted

## 2020-05-11 NOTE — Telephone Encounter (Signed)
Patient asking if she needs to reschedule her appointment since the Oncotype testing is not back yet. Please advise

## 2020-05-11 NOTE — Telephone Encounter (Signed)
Called pt back and said that we do not have oncotype results but dr Janese Banks would like to keep the appt with radiation. The pt. Will either need it after chemo or radiation first and then AI. Pt.is agreeable and I will keep a watch out for results and let pt. Know and will r/s for rao .

## 2020-05-13 ENCOUNTER — Ambulatory Visit: Payer: 59 | Admitting: Radiation Oncology

## 2020-05-13 ENCOUNTER — Inpatient Hospital Stay: Payer: 59 | Admitting: Oncology

## 2020-05-17 DIAGNOSIS — C50911 Malignant neoplasm of unspecified site of right female breast: Secondary | ICD-10-CM | POA: Diagnosis not present

## 2020-05-17 DIAGNOSIS — Z170 Estrogen receptor positive status [ER+]: Secondary | ICD-10-CM | POA: Diagnosis not present

## 2020-05-18 ENCOUNTER — Encounter: Payer: Self-pay | Admitting: Oncology

## 2020-05-23 DIAGNOSIS — Z170 Estrogen receptor positive status [ER+]: Secondary | ICD-10-CM | POA: Diagnosis not present

## 2020-05-23 DIAGNOSIS — C50912 Malignant neoplasm of unspecified site of left female breast: Secondary | ICD-10-CM | POA: Diagnosis not present

## 2020-05-24 ENCOUNTER — Other Ambulatory Visit: Payer: Self-pay | Admitting: Surgery

## 2020-05-24 ENCOUNTER — Encounter: Payer: Self-pay | Admitting: Oncology

## 2020-05-24 DIAGNOSIS — N6489 Other specified disorders of breast: Secondary | ICD-10-CM

## 2020-05-24 DIAGNOSIS — Z853 Personal history of malignant neoplasm of breast: Secondary | ICD-10-CM

## 2020-05-25 ENCOUNTER — Encounter: Payer: Self-pay | Admitting: Oncology

## 2020-05-25 ENCOUNTER — Ambulatory Visit: Payer: 59 | Admitting: Radiation Oncology

## 2020-05-26 ENCOUNTER — Encounter: Payer: Self-pay | Admitting: Oncology

## 2020-05-26 ENCOUNTER — Ambulatory Visit: Payer: 59 | Admitting: Dietician

## 2020-05-26 ENCOUNTER — Other Ambulatory Visit: Payer: Self-pay | Admitting: Surgery

## 2020-05-26 ENCOUNTER — Ambulatory Visit
Admission: RE | Admit: 2020-05-26 | Discharge: 2020-05-26 | Disposition: A | Discharge status: Home / Self Care | Payer: 59 | Source: Ambulatory Visit | Attending: Surgery | Admitting: Surgery

## 2020-05-26 ENCOUNTER — Other Ambulatory Visit: Payer: Self-pay

## 2020-05-26 DIAGNOSIS — Z853 Personal history of malignant neoplasm of breast: Secondary | ICD-10-CM

## 2020-05-26 DIAGNOSIS — M7981 Nontraumatic hematoma of soft tissue: Secondary | ICD-10-CM | POA: Diagnosis not present

## 2020-05-26 DIAGNOSIS — N6489 Other specified disorders of breast: Secondary | ICD-10-CM

## 2020-05-26 IMAGING — US US BREAST CYST ASPIRATION 1ST CYST
1 series · 3 of 3 positions shown · non-contrast
Comparison: Previous exams.
COMPARISON: Previous exams.

Addendum:
CLINICAL DATA: LEFT axillary seroma cavity.

EXAM:
ULTRASOUND GUIDED LEFT BREAST SEROMA ASPIRATION

[Series 1: us breast cyst aspiration 1st cyst · 0.07mm/px · 3 of 3 slices shown]
[im 1/3]
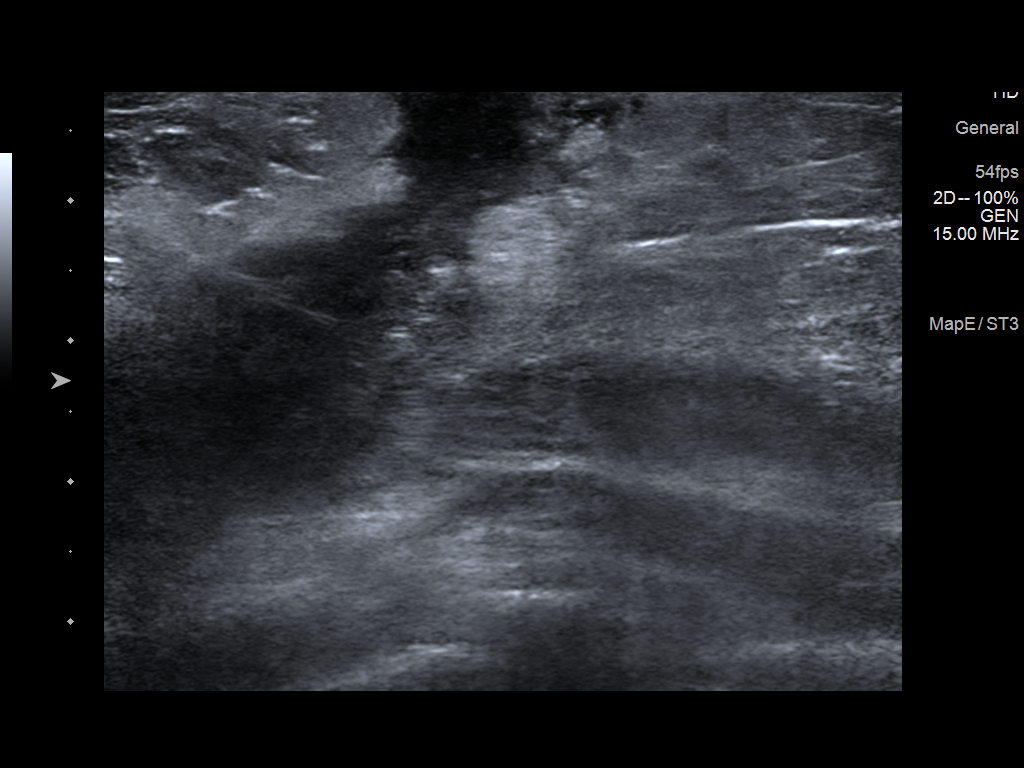
[im 2/3]
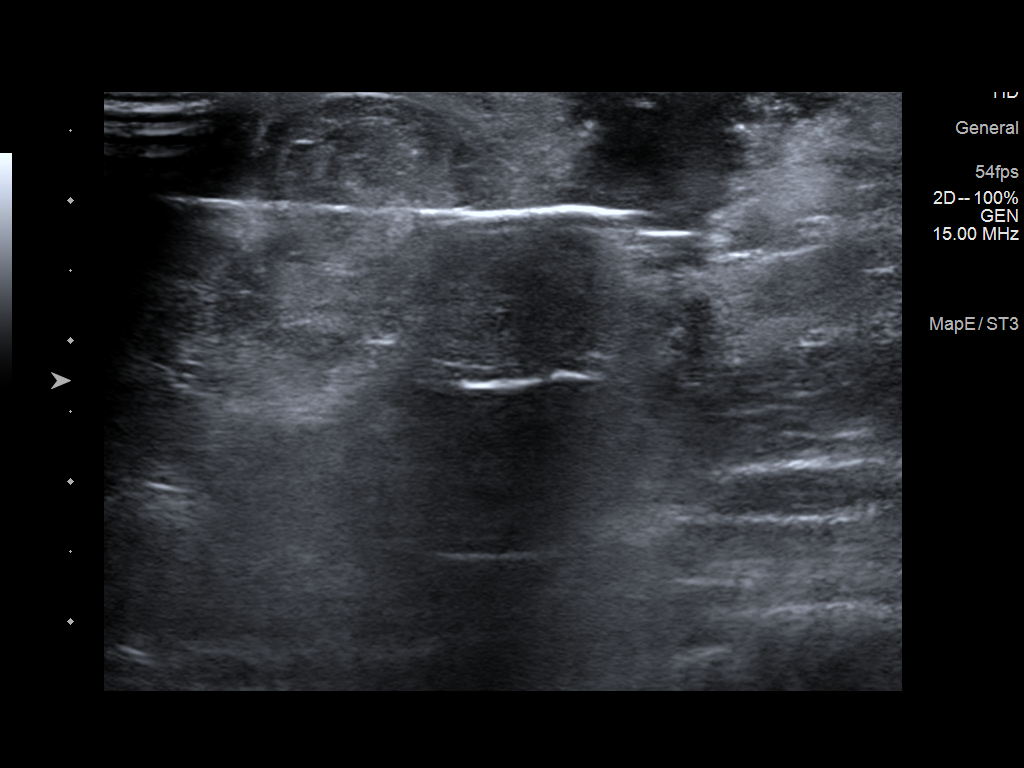
[im 3/3]
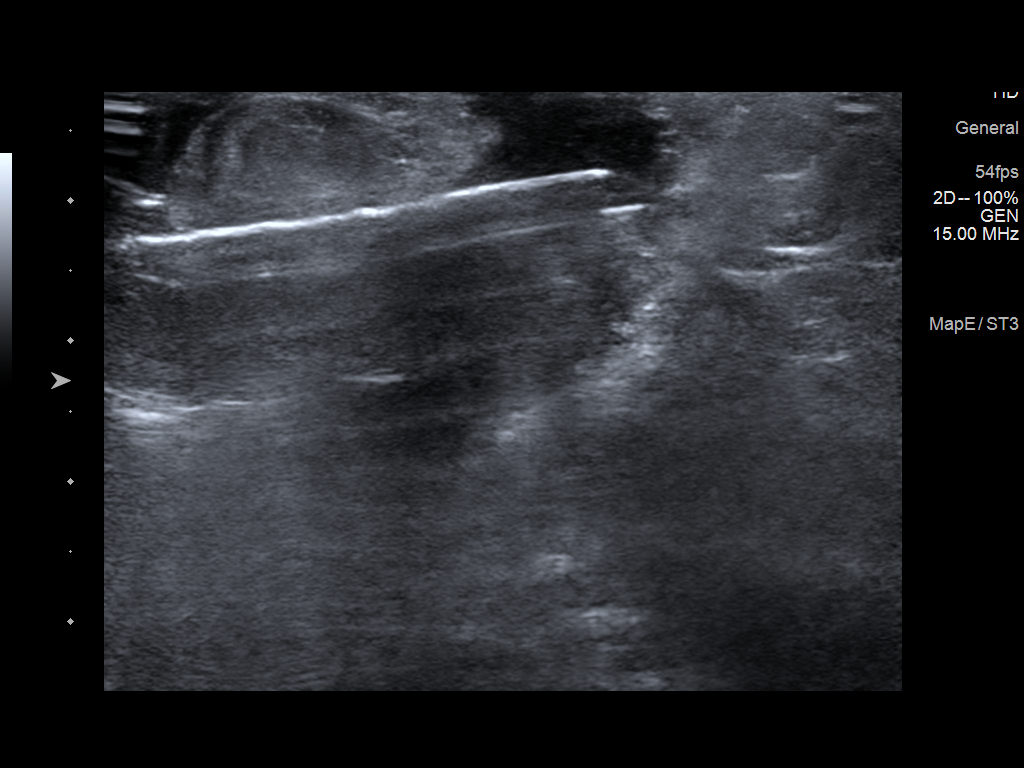

[3 of 3 positions shown; findings below may reference images not displayed]

PROCEDURE:
Using sterile technique, 1% lidocaine, under direct ultrasound
visualization, an 11 gauge introducer needle, aspiration of LEFT
axillary seroma cavity was performed. 1.0 ml of serous bloody fluid
was aspirated. Despite repositioning of the needle, loculated fluid
could not be aspirated.
IMPRESSION: Ultrasound-guided aspiration of LEFT axillary seroma cavity.

No apparent complications.

ADDENDUM:
Recommend LEFT axillary ultrasound in 1 week.

Bilateral diagnostic mammogram will be due in [DATE].

*** End of Addendum ***
PROCEDURE:
Using sterile technique, 1% lidocaine, under direct ultrasound
visualization, an 11 gauge introducer needle, aspiration of LEFT
axillary seroma cavity was performed. 1.0 ml of serous bloody fluid
was aspirated. Despite repositioning of the needle, loculated fluid
could not be aspirated.
IMPRESSION: Ultrasound-guided aspiration of LEFT axillary seroma cavity.

No apparent complications.

## 2020-05-26 IMAGING — US US AXILLARY LEFT
1 series · 6 of 6 positions shown · non-contrast
Comparison: Previous exam(s).

CLINICAL DATA: Status post bilateral lumpectomy and bilateral
sentinel lymph node dissection on [DATE]. The patient developed
a seroma cavity at the LEFT axillary incision. On [DATE], Dr.
NAVARRA withdrew 150 ml of fluid in the office. On [DATE], he
withdrew 80 ml of fluid in the office. 10 days ago patient developed
fever and chills and was started on doxycycline. An additional 60 ml
of fluid was aspirated 8-9 days ago. Antibiotics have been changed
to Keflex, and patient is feeling better. She still notes some
swelling in the LEFT axillary region, but pain has improved
significantly.

EXAM:
ULTRASOUND OF THE LEFT AXILLA

[Series 1: us axillary left · 0.08mm/px · 6 of 6 slices shown]
[im 1/6]
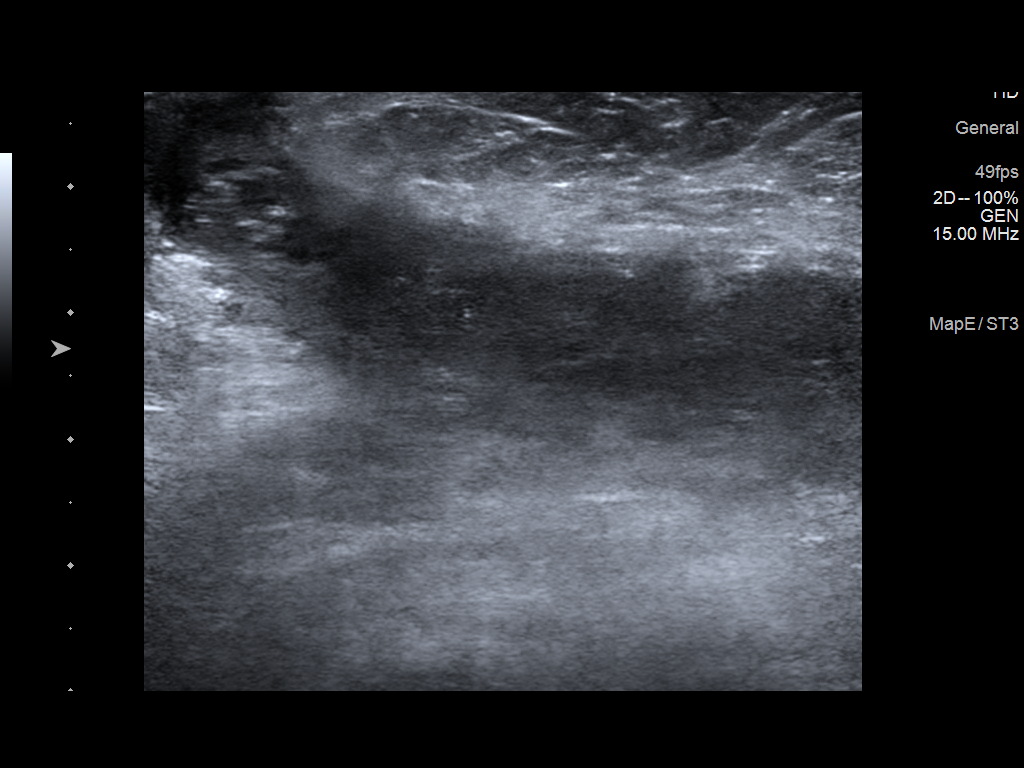
[im 2/6]
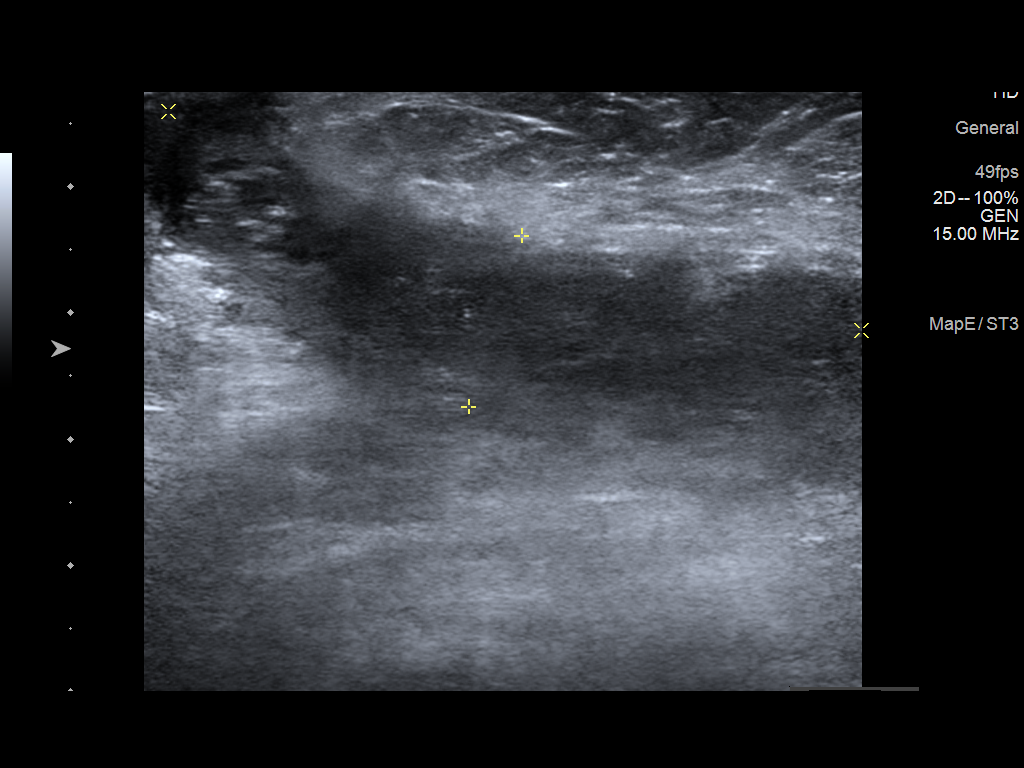
[im 3/6]
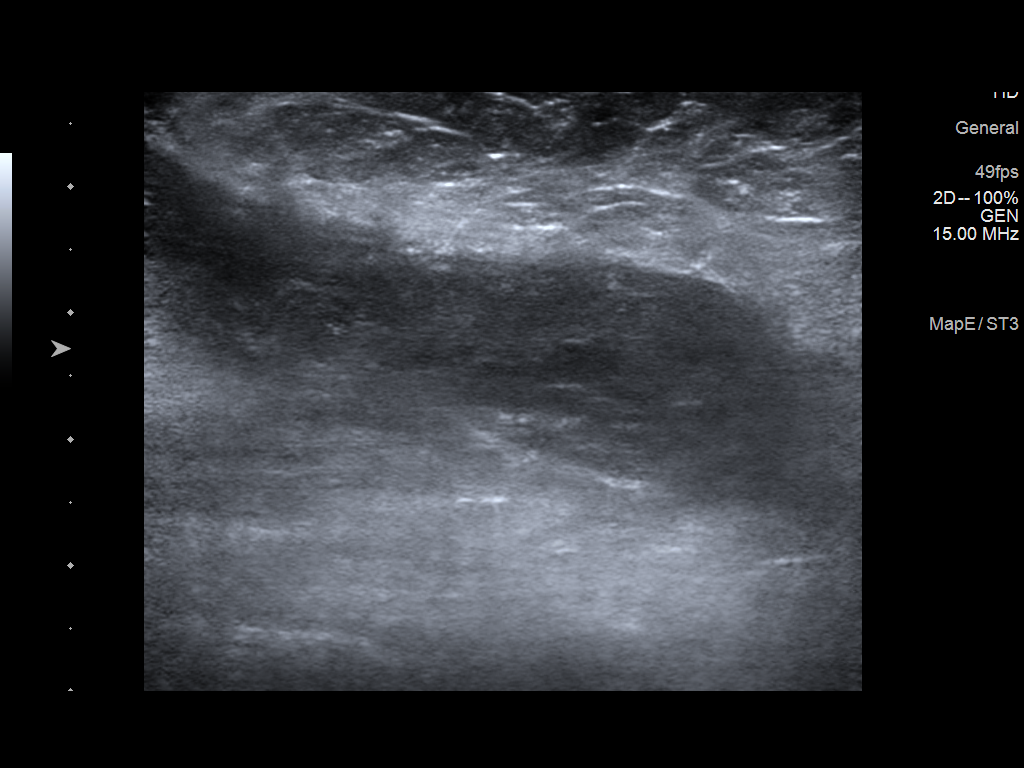
[im 4/6]
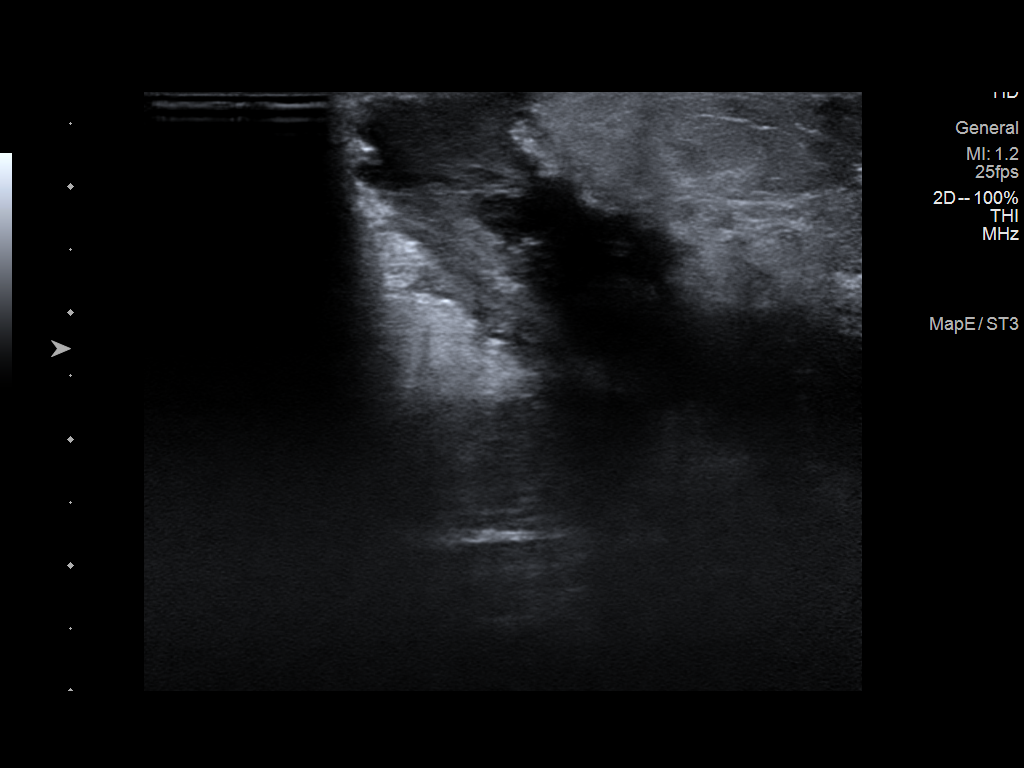
[im 5/6]
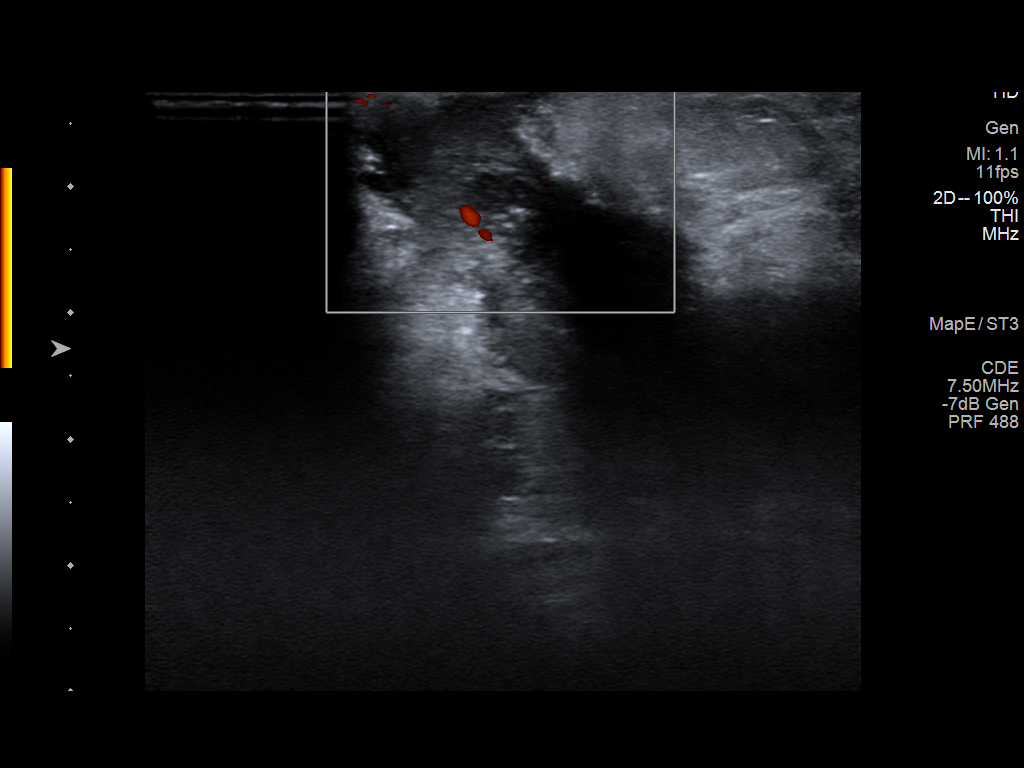
[im 6/6]
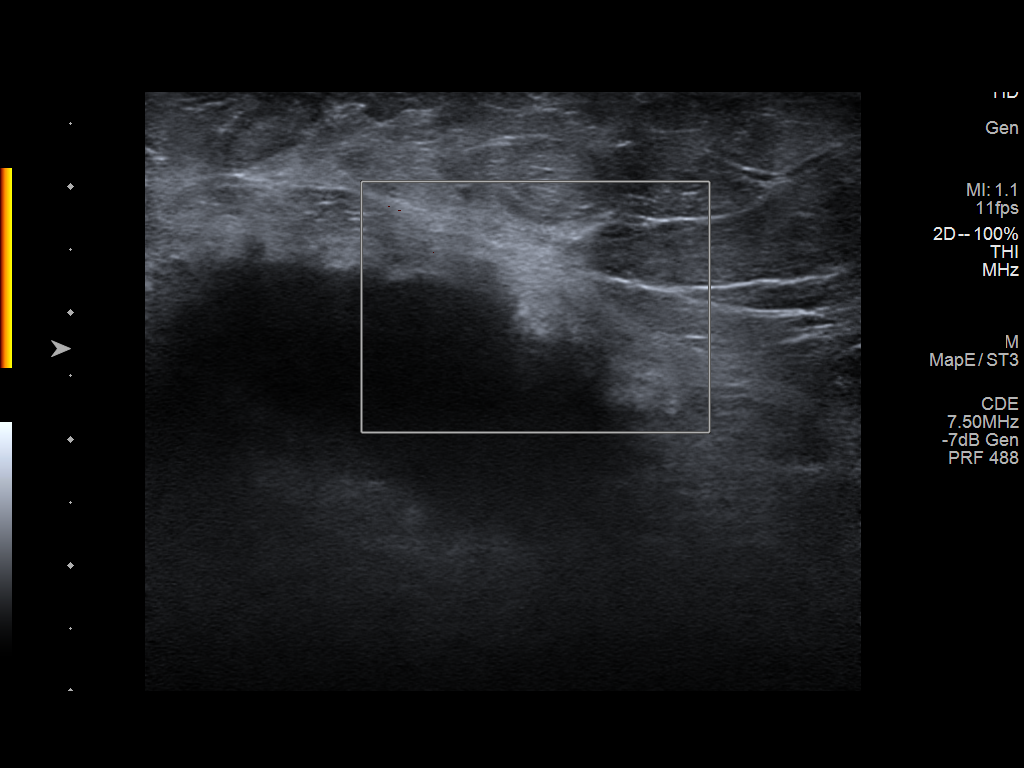

[6 of 6 positions shown; findings below may reference images not displayed]

FINDINGS: On physical exam,there is moderate erythema surrounding the LEFT
axillary scar. There is associated skin thickening in this region. I
palpate soft fullness in the LEFT axilla.

Ultrasound is performed, showing showing a heterogeneous oval
collection of fluid extending from the scar radially towards the
axilla. This collection measures approximately 6 x 1.4 centimeters.
On real-time ultrasound, mobile internal fluid/debris can be
identified.
IMPRESSION: Persistent seroma cavity in the LEFT axilla. We discussed the
benefits of aspiration. Aspiration is performed on the same day and
dictated separately.

RECOMMENDATION:
Ultrasound-guided aspiration of LEFT axillary fluid collection.

I have discussed the findings and recommendations with the patient.
If applicable, a reminder letter will be sent to the patient
regarding the next appointment.

BI-RADS CATEGORY  2: Benign.

## 2020-06-01 ENCOUNTER — Other Ambulatory Visit: Payer: 59

## 2020-06-02 ENCOUNTER — Other Ambulatory Visit: Payer: 59

## 2020-06-02 ENCOUNTER — Other Ambulatory Visit: Payer: Self-pay | Admitting: Oncology

## 2020-06-02 ENCOUNTER — Encounter: Payer: Self-pay | Admitting: *Deleted

## 2020-06-02 ENCOUNTER — Other Ambulatory Visit: Payer: Self-pay

## 2020-06-02 ENCOUNTER — Ambulatory Visit
Admission: RE | Admit: 2020-06-02 | Discharge: 2020-06-02 | Disposition: A | Discharge status: Home / Self Care | Payer: 59 | Source: Ambulatory Visit | Attending: Radiation Oncology | Admitting: Radiation Oncology

## 2020-06-02 ENCOUNTER — Encounter: Payer: Self-pay | Admitting: Oncology

## 2020-06-02 ENCOUNTER — Inpatient Hospital Stay: Payer: 59 | Attending: Oncology | Admitting: Oncology

## 2020-06-02 VITALS — BP 123/86 | HR 92 | Temp 97.8°F | Resp 18 | Wt 202.8 lb

## 2020-06-02 DIAGNOSIS — C50912 Malignant neoplasm of unspecified site of left female breast: Secondary | ICD-10-CM

## 2020-06-02 DIAGNOSIS — M8580 Other specified disorders of bone density and structure, unspecified site: Secondary | ICD-10-CM | POA: Insufficient documentation

## 2020-06-02 DIAGNOSIS — Z9221 Personal history of antineoplastic chemotherapy: Secondary | ICD-10-CM | POA: Diagnosis not present

## 2020-06-02 DIAGNOSIS — Z7189 Other specified counseling: Secondary | ICD-10-CM

## 2020-06-02 DIAGNOSIS — J45909 Unspecified asthma, uncomplicated: Secondary | ICD-10-CM | POA: Diagnosis not present

## 2020-06-02 DIAGNOSIS — Z803 Family history of malignant neoplasm of breast: Secondary | ICD-10-CM | POA: Diagnosis not present

## 2020-06-02 DIAGNOSIS — D0512 Intraductal carcinoma in situ of left breast: Secondary | ICD-10-CM | POA: Insufficient documentation

## 2020-06-02 DIAGNOSIS — Z853 Personal history of malignant neoplasm of breast: Secondary | ICD-10-CM | POA: Diagnosis not present

## 2020-06-02 DIAGNOSIS — C50911 Malignant neoplasm of unspecified site of right female breast: Secondary | ICD-10-CM | POA: Diagnosis not present

## 2020-06-02 DIAGNOSIS — Z79899 Other long term (current) drug therapy: Secondary | ICD-10-CM | POA: Diagnosis not present

## 2020-06-02 DIAGNOSIS — Z923 Personal history of irradiation: Secondary | ICD-10-CM | POA: Insufficient documentation

## 2020-06-02 DIAGNOSIS — Z170 Estrogen receptor positive status [ER+]: Secondary | ICD-10-CM | POA: Insufficient documentation

## 2020-06-02 MED ORDER — EXEMESTANE 25 MG PO TABS
25.0000 mg | ORAL_TABLET | Freq: Every day | ORAL | 3 refills | Status: DC
Start: 2020-06-02 — End: 2020-11-14

## 2020-06-02 NOTE — Progress Notes (Signed)
Radiation Oncology Follow up Note  Name: Marissa Phillips   Date:   06/02/2020 MRN:  [id number] DOB: Apr 11, 1961    This 59 y.o. female presents to the clinic today for reevaluation of her bilateral breast radiation after MRI and biopsy.  REFERRING PROVIDER: Idelle Crouch, MD  HPI: Patient is an interesting 59 year old female treated back in 2006 to her right whole breast status post wide local excision and sentinel node biopsy for early stage invasive mammary carcinoma..recently presented with abnormal mammogram of bilateral breasts.  Right breast showed a 0.6 5.5 x 0.5 cm mass in the right breast at the 2 o'clock position.  She also an abnormal right axillary lymph node with cortical thickening.  Left breast has a 0.5 cm mass in the left breast suspicious for malignancy.  Ultrasound-guided biopsy of both breasts showed a 6 mm area of invasive mammary carcinoma as well as a negative right axillary lymph node left breast confirmed invasive mammary carcinoma with lobular features measuring 3 mm overall grade 2.  Right breast was ER positive PR negative and HER-2/neu not overexpressed.  Left breast also was ER positive PR negative and HER-2/neu negative.    She then underwent MRI scan with 2 areas biopsied one of the left breast which was found to be fibrocystic changes and one was in the right breast showing fat necrosis.  Patient tolerated her wide local excisions well.  The left breast has had a seromatous had been drained several times still slightly erythematous in that region although she specifically denies pain.  COMPLICATIONS OF TREATMENT: none  FOLLOW UP COMPLIANCE: keeps appointments   PHYSICAL EXAM:  There were no vitals taken for this visit. The right breast shows changes of previous radiation therapy with still some hyperpigmentation of the skin new area in the nipple areolar complex towards approximately 3 o'clock position is well-healed left breast has an incision in the near the  axillary tail which is also healing well although slight erythema around it.  Well-developed well-nourished patient in NAD. HEENT reveals PERLA, EOMI, discs not visualized.  Oral cavity is clear. No oral mucosal lesions are identified. Neck is clear without evidence of cervical or supraclavicular adenopathy. Lungs are clear to A&P. Cardiac examination is essentially unremarkable with regular rate and rhythm without murmur rub or thrill. Abdomen is benign with no organomegaly or masses noted. Motor sensory and DTR levels are equal and symmetric in the upper and lower extremities. Cranial nerves II through XII are grossly intact. Proprioception is intact. No peripheral adenopathy or edema is identified. No motor or sensory levels are noted. Crude visual fields are within normal range.  RADIOLOGY RESULTS: Mammograms MRI scans reviewed  PLAN: At this time would like to go ahead with partial breast radiation to the right breast with plan on delivering 3400 cGy in 10 fractions at 340 centigrade twice a day targeting the lumpectomy cavity only.  I would treat the left breast with a hypofractionated course over 3 weeks boosting her scar another 5 fractions to 1000 cGy for her boost.  Risks and benefits of treatment occluding skin reaction fatigue alteration of blood counts possible inclusion of superficial lung all were discussed in detail also possibility of more fat necrosis in the breast secondary to reirradiation of the right breast was reviewed.  I have personally set up and ordered CT simulation for next week.  Patient comprehends my treatment plan well.  I would like to take this opportunity to thank you for allowing me to  participate in the care of your patient.Noreene Filbert, MD

## 2020-06-02 NOTE — Progress Notes (Signed)
Pt in for follow up, test results.  Denies any concerns today.

## 2020-06-02 NOTE — Research (Signed)
Met with patient Marissa Phillips during her scheduled clinic visit this afternoon to talk with her about the [facility] [address] UPBEAT study. Patient is eligible for the study provided she completes her Radiation therapy prior to the study closing to enrollment. Reviewed purpose of the study as well as study procedure that are required. A copy of the informed consent form was provided for the patient and she has agreed to read over it and consider participation. She is aware that participation is voluntary, and has participated in clinical trials at [facility] in the past. Plans were made to contact Marissa Phillips by phone early next week to determine her interest in the study. She sees Dr. Janese Banks in Medical oncology as well as Dr. Baruch Gouty in Radiation for treatment planning today. Marissa Phillips, BSN, MHA, OCN 06/02/2020 2:21 PM   Received email communication from patient Marissa Phillips this afternoon stating that she has decided to participate in the [place] UPBEAT study. Informed that we will wait for Dr. Baruch Gouty to schedule her RT appointments, then arrange for study consent and registration accordingly. Patient agrees with this plan. She has radiation simulation on 06/08/2020. Marissa Phillips, BSN, MHA, OCN 06/06/2020 4:12 PM   Met with patient this morning following her Radiation simulation appointment. Questioned whether RT could begin earlier and Radiation Oncology nurse Casper Harrison verified that the patient was going to receive twice daily treatments for a while and the start date could not be backed up. Instructed patient that we will need to get written consent for the study on 06/24/2020 and I will register her on that same day. Verified with her that she is able to walk at least 2 blocks without becoming short of breath and that she can hold her breath for 10 seconds and she states she is able to do this. Discussed whether she is claustrophobic and patient reports she has had MRIs in the past without being  claustrophobic, but also states her husband could transport her to the Cardiac MRI appointments in the event that she needs to take a sedative. She denies having any further questions at present about the study, but does question her baseline bone density test. It is currently scheduled for October, 2021 and patient's understanding is that Dr. Janese Banks wanted her to have it prior to beginning AI therapy. Informed that I will ask Dr. Janese Banks about this to clarify. Patient already has her prescription for Aromasin and is currently scheduled to begin taking it on 07/22/20 - as soon as she completes her radiation therapy. Marissa Phillips, BSN, MHA, OCN 06/09/2020 12:20 PM   Met with Marissa Phillips this morning following her scheduled radiation therapy treatment for purpose of having patient provide written consent to participate in the [place] [id number] UPBEAT study. Patient verifies that she has read the consent a few weeks ago and denies having any questions at this point. Informed consent form reviewed again with patient page by page to ensure no further questions come up. Reviewed time points that patient will be required to complete study activities and what these activities entail. Informed that she will be registered - not randomized and that all participants are on the same study regimen, that we do not participate in the CPet and we do not require her to walk on a treadmill as described in the consent form. She is aware that participation is voluntary, that she can withdraw at any time and for any reason, that she will not be directly paid for study participation, but she  will be given a $25 gift card at each study time point when she completes the required study tasks. Discussed the baseline, 3 month and 245 month cardiac MRI that will be performed at [facility]. All other procedures will be performed locally. Potential risks and benefits were also reviewed. Patient provided written consent for the [id number] Informed Consent  Form; Protocol version date: 12/30/19 and accompanying [employer] HIPAA form dated 04/11/16. She also signed the [facility] Release of Information Form. Patient was given copies of all signed forms for her records. Informed her that she will be registered today because this is the last day for UPBEAT study entry, but she will not officially begin the study until she completes her radiation therapy and begins the Aromasin therapy at the end of the month. Baseline study procedures will be completed in the meantime, and plans made for me to contact patient with potential study visit dates/times. She also has been provided with contact information for myself and Jeral Fruit, RN in the event she has any questions. Marissa Phillips was found to meet all of the eligibility and non of the ineligibility criteria for study entry. 2nd review was completed by Jeral Fruit, RN and both she and Dr. Janese Banks agree that the patient is eligible for the study. Patient was registered via OPEN this morning. Marissa Phillips, BSN, MHA, OCN 06/24/2020 9:58 AM

## 2020-06-03 NOTE — Progress Notes (Signed)
Hematology/Oncology Consult note [facility]  [phone] [phone]  Patient Care Team: Idelle Crouch, MD as PCP - General (Internal Medicine) Theodore Demark, RN as Oncology Nurse Navigator Noreene Filbert, MD as Radiation Oncologist (Radiation Oncology)   Name of the patient: Marissa Phillips  [id number]  [birth year]   Date of visit: 06/03/20  Diagnosis-bilateral breast cancer ER/PR positive and HER-2/neu negative s/p bilateral lumpectomy  Chief complaint/ Reason for visit-discuss Oncotype results and further management  Heme/Onc history:  Patient is a 59 year old female who recently underwent a bilateral screening mammogram on 07/13/2020 which showed possible mass in both the right and left breast.  This was followed by a diagnostic mammogram and ultrasound.  Right breast demonstrated 0.6 x 0.5 x 0.5 cm hypoechoic mass in the left breast demonstrated 0.5 x 0.5 x 0.4 cm hypoechoic mass.  There was an abnormal right axillary lymph node noted with cortical thickening.  All these areas were biopsied.  Right axillary lymph node was negative for malignancy.  Right breast mass showed invasive mammary carcinoma 6 mm grade 3.  Left breast mass showed invasive mammary carcinoma with lobular features 3 mm grade 2.  ER/PR and HER-2 status was pending at the time of my visit  Patient has a prior history of ER positive breast cancer back in 2005.  She is s/p surgery and radiation and hormone therapy at that time for 5 years  Bilateral breast MRI showed biopsy-proven malignancy in bilateral breasts indeterminate non-mass enhancement adjacent to prior right breast lumpectomy and enhancing focus in the central left breast.Biopsy of both the sites revealed fat necrosis of the right breast and fibrocystic changes in the left  Patient went for bilateral lumpectomy.  Right breast pathology showed 0.6 cm invasive ductal carcinoma grade 2 with negative margins and  left breast showed 1.2 cm invasive lobular carcinoma.  1 sentinel lymph node on the left side was negative for malignancy.  Both these tumors were ER positive and HER-2 negative.  PR negative.  Oncotype testing was sent on both right and left breast specimens and both came back with an Oncotype score of 24 which puts her at intermediate risk for age.  She would not benefit from adjuvant chemotherapy   Interval history-patient will be meeting Dr. Donella Stade today to discuss adjuvant radiation treatment.  Although she has had a prior right breast lumpectomy she will be getting reradiated at this time. Patient had problems tolerating Arimidex in the past and was switched to an alternative AI which she tolerated well except for hot flashes for which she took Effexor and vitamin D in the past  Post lumpectomy patient developed seroma in her left breast which required drainage.  ECOG PS- 1 Pain scale- 0   Review of systems- Review of Systems  Constitutional: Negative for chills, fever, malaise/fatigue and weight loss.  HENT: Negative for congestion, ear discharge and nosebleeds.   Eyes: Negative for blurred vision.  Respiratory: Negative for cough, hemoptysis, sputum production, shortness of breath and wheezing.   Cardiovascular: Negative for chest pain, palpitations, orthopnea and claudication.  Gastrointestinal: Negative for abdominal pain, blood in stool, constipation, diarrhea, heartburn, melena, nausea and vomiting.  Genitourinary: Negative for dysuria, flank pain, frequency, hematuria and urgency.  Musculoskeletal: Negative for back pain, joint pain and myalgias.  Skin: Negative for rash.  Neurological: Negative for dizziness, tingling, focal weakness, seizures, weakness and headaches.  Endo/Heme/Allergies: Does not bruise/bleed easily.  Psychiatric/Behavioral: Negative for depression and suicidal ideas.  The patient does not have insomnia.       Allergies  Allergen Reactions  . Celebrex  [Celecoxib] Rash  . Clindamycin Hcl Rash  . Loratadine Other (See Comments)    fevers  . Omeprazole Other (See Comments)    After 1 week of therapy dry mouth and mild nighttime urinary frequency -bothersome enough for her stop it.   Marland Kitchen Penicillins Rash    Slight redness and itching   . Sulfa Antibiotics Rash     Past Medical History:  Diagnosis Date  . Asthma   . Breast cancer ([place]) 2005  . Cancer [facility])    Breast- Right-2005  . Osteopenia   . Personal history of chemotherapy   . Personal history of radiation therapy   . PONV (postoperative nausea and vomiting)      Past Surgical History:  Procedure Laterality Date  . BREAST BIOPSY Right 2005   +  . BREAST BIOPSY Right 03/01/2020   [place] bx of mass at 2:00 heart marker, path pending  . BREAST BIOPSY Left 03/01/2020   [place] bx mass at 3:00, coil marker, path pending  . BREAST BIOPSY Right 03/01/2020   [place] bx of right axilla, no marker placed, path pending  . BREAST LUMPECTOMY Right 2005  . BREAST LUMPECTOMY WITH NEEDLE LOCALIZATION AND AXILLARY SENTINEL LYMPH NODE BX Right 2005  . BREAST LUMPECTOMY WITH RADIOACTIVE SEED AND SENTINEL LYMPH NODE BIOPSY Bilateral 04/14/2020   Procedure: BILATERAL RADIOACTIVE SEED GUIDED BREAST LUMPECTOMIES, BILATERAL AXILLARY SENTINEL LYMPH NODE BIOPSIES, WITH BLUE DYE INJECTION RIGHT BREAST;  Surgeon: Alphonsa Overall, MD;  Location: [place];  Service: General;  Laterality: Bilateral;  . BREAST SURGERY    . COLONOSCOPY WITH PROPOFOL N/A 04/02/2017   Procedure: COLONOSCOPY WITH PROPOFOL;  Surgeon: Jonathon Bellows, MD;  Location: [facility] ENDOSCOPY;  Service: Endoscopy;  Laterality: N/A;  . ESOPHAGOGASTRODUODENOSCOPY (EGD) WITH PROPOFOL N/A 04/02/2017   Procedure: ESOPHAGOGASTRODUODENOSCOPY (EGD) WITH PROPOFOL;  Surgeon: Jonathon Bellows, MD;  Location: [facility] ENDOSCOPY;  Service: Endoscopy;  Laterality: N/A;  . TONSILLECTOMY  ~2000    Social History   Socioeconomic History  . Marital status: Married    Spouse name: Not  on file  . Number of children: Not on file  . Years of education: Not on file  . Highest education level: Not on file  Occupational History  . Not on file  Tobacco Use  . Smoking status: Never Smoker  . Smokeless tobacco: Never Used  Vaping Use  . Vaping Use: Never used  Substance and Sexual Activity  . Alcohol use: No  . Drug use: No  . Sexual activity: Not on file  Other Topics Concern  . Not on file  Social History Narrative  . Not on file   Social Determinants of Health   Financial Resource Strain:   . Difficulty of Paying Living Expenses:   Food Insecurity:   . Worried About Charity fundraiser in the Last Year:   . Arboriculturist in the Last Year:   Transportation Needs:   . Film/video editor (Medical):   Marland Kitchen Lack of Transportation (Non-Medical):   Physical Activity:   . Days of Exercise per Week:   . Minutes of Exercise per Session:   Stress:   . Feeling of Stress :   Social Connections:   . Frequency of Communication with Friends and Family:   . Frequency of Social Gatherings with Friends and Family:   . Attends Religious Services:   . Active  Member of Clubs or Organizations:   . Attends Archivist Meetings:   Marland Kitchen Marital Status:   Intimate Partner Violence:   . Fear of Current or Ex-Partner:   . Emotionally Abused:   Marland Kitchen Physically Abused:   . Sexually Abused:     Family History  Problem Relation Age of Onset  . Cancer Mother 50       Breast, now bilateral  . Breast cancer Mother 78       right breast ca then left breast ca   . Leukemia Mother      Current Outpatient Medications:  .  acetaminophen (TYLENOL) 500 MG tablet, Take 500-1,000 mg by mouth 2 (two) times daily as needed (pain.)., Disp: , Rfl:  .  cetirizine (ZYRTEC ALLERGY) 10 MG tablet, Take 10 mg by mouth at bedtime as needed (seasonal allergies.). , Disp: , Rfl:  .  Cholecalciferol 50 MCG (2000 UT) TABS, Take 2,000 Units by mouth daily., Disp: , Rfl:  .  famotidine (PEPCID)  20 MG tablet, Take 20 mg by mouth daily as needed for heartburn or indigestion., Disp: , Rfl:  .  L-Theanine 200 MG CAPS, Take 200-600 mg by mouth at bedtime as needed (sleep). , Disp: , Rfl:  .  exemestane (AROMASIN) 25 MG tablet, Take 1 tablet (25 mg total) by mouth daily after breakfast., Disp: 30 tablet, Rfl: 3 .  HYDROcodone-acetaminophen (NORCO/VICODIN) 5-325 MG tablet, Take 1 tablet by mouth every 6 (six) hours as needed for moderate pain. (Patient not taking: Reported on 06/02/2020), Disp: 15 tablet, Rfl: 0 .  naproxen sodium (ALEVE) 220 MG tablet, Take 220-440 mg by mouth daily as needed (pain.).  (Patient not taking: Reported on 06/02/2020), Disp: , Rfl:   Physical exam:  Vitals:   06/02/20 1350  BP: 123/86  Pulse: 92  Resp: 18  Temp: 97.8 F (36.6 C)  TempSrc: Tympanic  SpO2: 97%  Weight: 202 lb 12.8 oz (92 kg)   Physical Exam Constitutional:      General: She is not in acute distress. Pulmonary:     Effort: Pulmonary effort is normal.  Skin:    General: Skin is warm and dry.  Neurological:     Mental Status: She is alert and oriented to person, place, and time.   Patient is s/p bilateral lumpectomy with left sentinel lymph node biopsy.  There is some redness noted over the sentinel lymph node biopsy site.  No palpable seroma or infection  CMP Latest Ref Rng & Units 04/11/2020  Glucose 70 - 99 mg/dL 97  BUN 6 - 20 mg/dL 11  Creatinine 0.44 - 1.00 mg/dL 0.94  Sodium 135 - 145 mmol/L 142  Potassium 3.5 - 5.1 mmol/L 4.3  Chloride 98 - 111 mmol/L 105  CO2 22 - 32 mmol/L 28  Calcium 8.9 - 10.3 mg/dL 9.8  Total Protein 6.4 - 8.2 g/dL -  Total Bilirubin 0.2 - 1.0 mg/dL -  Alkaline Phos 50 - 136 Unit/L -  AST 15 - 37 Unit/L -  ALT 12 - 78 U/L -   CBC Latest Ref Rng & Units 04/11/2020  WBC 4.0 - 10.5 K/uL 4.5  Hemoglobin 12.0 - 15.0 g/dL 14.3  Hematocrit 36 - 46 % 45.3  Platelets 150 - 400 K/uL 250    No images are attached to the encounter.  [place] AXILLA LEFT  Result  Date: 05/26/2020 CLINICAL DATA:  Status post bilateral lumpectomy and bilateral sentinel lymph node dissection on 04/14/2020. The patient developed  a seroma cavity at the LEFT axillary incision. On 04/29/2020, Dr. Lucia Gaskins withdrew 150 ml of fluid in the office. On 05/03/2020, he withdrew 80 ml of fluid in the office. 10 days ago patient developed fever and chills and was started on doxycycline. An additional 60 ml of fluid was aspirated 8-9 days ago. Antibiotics have been changed to Keflex, and patient is feeling better. She still notes some swelling in the LEFT axillary region, but pain has improved significantly. EXAM: ULTRASOUND OF THE LEFT AXILLA COMPARISON:  Previous exam(s). FINDINGS: On physical exam,there is moderate erythema surrounding the LEFT axillary scar. There is associated skin thickening in this region. I palpate soft fullness in the LEFT axilla. Ultrasound is performed, showing showing a heterogeneous oval collection of fluid extending from the scar radially towards the axilla. This collection measures approximately 6 x 1.4 centimeters. On real-time ultrasound, mobile internal fluid/debris can be identified. IMPRESSION: Persistent seroma cavity in the LEFT axilla. We discussed the benefits of aspiration. Aspiration is performed on the same day and dictated separately. RECOMMENDATION: Ultrasound-guided aspiration of LEFT axillary fluid collection. I have discussed the findings and recommendations with the patient. If applicable, a reminder letter will be sent to the patient regarding the next appointment. BI-RADS CATEGORY  2: Benign. Electronically Signed   By: Nolon Nations M.D.   On: 05/26/2020 12:24   US BREAST ASPIRATION LEFT  Addendum Date: 05/26/2020   ADDENDUM REPORT: 05/26/2020 15:19 ADDENDUM: Recommend LEFT axillary ultrasound in 1 week. Bilateral diagnostic mammogram will be due in March 2022. Electronically Signed   By: Nolon Nations M.D.   On: 05/26/2020 15:19   Result Date:  05/26/2020 CLINICAL DATA:  LEFT axillary seroma cavity. EXAM: ULTRASOUND GUIDED LEFT BREAST SEROMA ASPIRATION COMPARISON:  Previous exams. PROCEDURE: Using sterile technique, 1% lidocaine, under direct ultrasound visualization, an 11 gauge introducer needle, aspiration of LEFT axillary seroma cavity was performed. 1.0 ml of serous bloody fluid was aspirated. Despite repositioning of the needle, loculated fluid could not be aspirated. IMPRESSION: Ultrasound-guided aspiration of LEFT axillary seroma cavity. No apparent complications. Electronically Signed: By: Nolon Nations M.D. On: 05/26/2020 12:18     Assessment and plan- Patient is a 59 y.o. female with prior history of right breast cancer now with bilateral breast cancer stage I ER positive and HER-2 negative s/p bilateral lumpectomy and left sentinel lymph node biopsy here to discuss further management  Right breast lumpectomy specimen was 0.6 cm invasive ductal carcinoma with negative margins and the left 1 was 1.2 cm invasive lobular carcinoma with negative margins.  1 sentinel lymph node on the left side was negative for malignancy.  Tumor is ER positive and HER-2 negative.  Oncotype score for both the breast was 24 which puts her in an intermediate risk and given her age she would not benefit from adjuvant chemotherapy.  Patient will proceed with adjuvant radiation treatment at this time  Given that her tumor was ER PR positive hormone therapy is indicated at this time.  I discussed the role for hormone therapy. Given that she is postmenopausal I would favor 10 years of adjuvant hormone therapy with aromatase inhibitor given her prior history of breast cancer and present bilateral breast cancer. I discussed the risks and benefits of Aromasin including all but not limited to fatigue, hypercholesterolemia, hot flashes, arthralgias and worsening bone health.  Patient will also need to be on calcium 1200 mg along with vitamin D 800 international  units.  We will obtain a baseline bone density  scan written information about Arimidex given to the patient. I would like her to finish radiation therapy and start hormone therapy thereafter. Patient verbalized understanding and agrees to proceed  I will see her back in 3-1/2 months to see how she is tolerating her Aromasin.  Treatment will be given with a curative intent.  We will obtain a baseline bone density scan at this time      Visit Diagnosis 1. Bilateral malignant neoplasm of breast in female, unspecified estrogen receptor status, unspecified site of breast ([place])   2. Goals of care, counseling/discussion      Dr. Randa Evens, MD, MPH [facility] [phone] 06/03/2020 12:09 PM

## 2020-06-09 ENCOUNTER — Other Ambulatory Visit: Payer: Self-pay

## 2020-06-09 ENCOUNTER — Ambulatory Visit
Admission: RE | Admit: 2020-06-09 | Discharge: 2020-06-09 | Disposition: A | Discharge status: Home / Self Care | Payer: 59 | Source: Ambulatory Visit | Attending: Radiation Oncology | Admitting: Radiation Oncology

## 2020-06-09 ENCOUNTER — Encounter: Payer: 59 | Attending: Internal Medicine | Admitting: Dietician

## 2020-06-09 DIAGNOSIS — C50912 Malignant neoplasm of unspecified site of left female breast: Secondary | ICD-10-CM | POA: Insufficient documentation

## 2020-06-09 DIAGNOSIS — D0511 Intraductal carcinoma in situ of right breast: Secondary | ICD-10-CM | POA: Diagnosis not present

## 2020-06-09 DIAGNOSIS — Z170 Estrogen receptor positive status [ER+]: Secondary | ICD-10-CM | POA: Diagnosis not present

## 2020-06-09 DIAGNOSIS — D0512 Intraductal carcinoma in situ of left breast: Secondary | ICD-10-CM | POA: Diagnosis not present

## 2020-06-09 DIAGNOSIS — C50911 Malignant neoplasm of unspecified site of right female breast: Secondary | ICD-10-CM | POA: Insufficient documentation

## 2020-06-09 DIAGNOSIS — Z713 Dietary counseling and surveillance: Secondary | ICD-10-CM

## 2020-06-09 NOTE — Progress Notes (Signed)
[employer] Employee "self referral" nutrition session: Start time: 1500   End time: 1600  Met with employee to discuss her nutritional concerns and diet history.   Diet history:  Pt reports trying yogurt and cheese sticks for snacks for added protein at snacks.    Typical eating pattern: Breakfast: scrambled eggs, sausage or bacon, tater tots/toast/pancake; tea with nabs; egg and cheese biscuit   Lunch: light and fit greek yogurt with KIND granola; nabs; pop tart; [place] sandwich   Snack: cheese doodles, potato chips, cheese sticks    Supper: grilled chicken; breakfast for dinner; seafood;  Beverages: water, unsweet tea with sweet and low; diet soda; coffee     Education topics covered during this visit:  Exercise  Weight Concerns  Other Medical Conditions: oncology, healthy skin/healing  Educational resources provided:  Planning a Balanced Meal  Plate Planner with food lists  General dietary guidelines for high blood pressure  General dietary guidelines for diabetes  General dietary guidelines for heart healthy eating  Sample menus and/or recipes   Additional Comments: Pt has made several changes to diet since last visit.  Weight is down 2 lbs since initial visit.  Pt attributes weight loss to loss in appetite and fatigue for 3 weeks post op.  She is making sure to eat healthy protein sources at eat meal and most snacks.  Answered questions regarding nutrients that help with skin health and healing.  Also discussed electrolyte balance.    Plan:  Have protein shakes at home for quick, easy meal replacement   Have fruit cups, applesauce, and frozen microwaveable veggies available for easy F/V intake   Continue to prioritize the protein in meals to help preserve LBM  Drink Gatorade or Pedialyte to replenish electrolytes if diarrhea  Consider adding multivitamin to ensure adequate nutrition, especially if appetite and intake decrease

## 2020-06-15 DIAGNOSIS — Z170 Estrogen receptor positive status [ER+]: Secondary | ICD-10-CM | POA: Diagnosis not present

## 2020-06-15 DIAGNOSIS — C50911 Malignant neoplasm of unspecified site of right female breast: Secondary | ICD-10-CM | POA: Diagnosis not present

## 2020-06-15 DIAGNOSIS — D0511 Intraductal carcinoma in situ of right breast: Secondary | ICD-10-CM | POA: Diagnosis not present

## 2020-06-15 DIAGNOSIS — C50912 Malignant neoplasm of unspecified site of left female breast: Secondary | ICD-10-CM | POA: Diagnosis not present

## 2020-06-15 DIAGNOSIS — D0512 Intraductal carcinoma in situ of left breast: Secondary | ICD-10-CM | POA: Diagnosis not present

## 2020-06-16 ENCOUNTER — Ambulatory Visit
Admission: RE | Admit: 2020-06-16 | Discharge: 2020-06-16 | Disposition: A | Discharge status: Home / Self Care | Payer: 59 | Source: Ambulatory Visit | Attending: Oncology | Admitting: Oncology

## 2020-06-16 ENCOUNTER — Ambulatory Visit: Admission: RE | Admit: 2020-06-16 | Payer: 59 | Source: Ambulatory Visit

## 2020-06-16 DIAGNOSIS — M85852 Other specified disorders of bone density and structure, left thigh: Secondary | ICD-10-CM | POA: Diagnosis not present

## 2020-06-16 DIAGNOSIS — C50911 Malignant neoplasm of unspecified site of right female breast: Secondary | ICD-10-CM | POA: Diagnosis not present

## 2020-06-16 DIAGNOSIS — C50912 Malignant neoplasm of unspecified site of left female breast: Secondary | ICD-10-CM | POA: Diagnosis not present

## 2020-06-16 DIAGNOSIS — Z780 Asymptomatic menopausal state: Secondary | ICD-10-CM | POA: Diagnosis not present

## 2020-06-20 ENCOUNTER — Ambulatory Visit
Admission: RE | Admit: 2020-06-20 | Discharge: 2020-06-20 | Disposition: A | Discharge status: Home / Self Care | Payer: 59 | Source: Ambulatory Visit | Attending: Radiation Oncology | Admitting: Radiation Oncology

## 2020-06-20 DIAGNOSIS — D0511 Intraductal carcinoma in situ of right breast: Secondary | ICD-10-CM | POA: Diagnosis not present

## 2020-06-20 DIAGNOSIS — D0512 Intraductal carcinoma in situ of left breast: Secondary | ICD-10-CM | POA: Diagnosis not present

## 2020-06-20 DIAGNOSIS — C50911 Malignant neoplasm of unspecified site of right female breast: Secondary | ICD-10-CM | POA: Diagnosis not present

## 2020-06-20 DIAGNOSIS — C50912 Malignant neoplasm of unspecified site of left female breast: Secondary | ICD-10-CM | POA: Diagnosis not present

## 2020-06-20 DIAGNOSIS — Z170 Estrogen receptor positive status [ER+]: Secondary | ICD-10-CM | POA: Diagnosis not present

## 2020-06-21 ENCOUNTER — Ambulatory Visit
Admission: RE | Admit: 2020-06-21 | Discharge: 2020-06-21 | Disposition: A | Discharge status: Home / Self Care | Payer: 59 | Source: Ambulatory Visit | Attending: Radiation Oncology | Admitting: Radiation Oncology

## 2020-06-21 DIAGNOSIS — D0512 Intraductal carcinoma in situ of left breast: Secondary | ICD-10-CM | POA: Diagnosis not present

## 2020-06-21 DIAGNOSIS — D0511 Intraductal carcinoma in situ of right breast: Secondary | ICD-10-CM | POA: Diagnosis not present

## 2020-06-21 DIAGNOSIS — Z170 Estrogen receptor positive status [ER+]: Secondary | ICD-10-CM | POA: Diagnosis not present

## 2020-06-21 DIAGNOSIS — C50911 Malignant neoplasm of unspecified site of right female breast: Secondary | ICD-10-CM | POA: Diagnosis not present

## 2020-06-21 DIAGNOSIS — C50912 Malignant neoplasm of unspecified site of left female breast: Secondary | ICD-10-CM | POA: Diagnosis not present

## 2020-06-22 ENCOUNTER — Ambulatory Visit
Admission: RE | Admit: 2020-06-22 | Discharge: 2020-06-22 | Disposition: A | Discharge status: Home / Self Care | Payer: 59 | Source: Ambulatory Visit | Attending: Radiation Oncology | Admitting: Radiation Oncology

## 2020-06-22 DIAGNOSIS — Z170 Estrogen receptor positive status [ER+]: Secondary | ICD-10-CM | POA: Diagnosis not present

## 2020-06-22 DIAGNOSIS — C50912 Malignant neoplasm of unspecified site of left female breast: Secondary | ICD-10-CM | POA: Diagnosis not present

## 2020-06-22 DIAGNOSIS — D0511 Intraductal carcinoma in situ of right breast: Secondary | ICD-10-CM | POA: Diagnosis not present

## 2020-06-22 DIAGNOSIS — D0512 Intraductal carcinoma in situ of left breast: Secondary | ICD-10-CM | POA: Diagnosis not present

## 2020-06-22 DIAGNOSIS — C50911 Malignant neoplasm of unspecified site of right female breast: Secondary | ICD-10-CM | POA: Diagnosis not present

## 2020-06-23 ENCOUNTER — Ambulatory Visit
Admission: RE | Admit: 2020-06-23 | Discharge: 2020-06-23 | Disposition: A | Discharge status: Home / Self Care | Payer: 59 | Source: Ambulatory Visit | Attending: Radiation Oncology | Admitting: Radiation Oncology

## 2020-06-23 DIAGNOSIS — C50911 Malignant neoplasm of unspecified site of right female breast: Secondary | ICD-10-CM | POA: Diagnosis not present

## 2020-06-23 DIAGNOSIS — D0512 Intraductal carcinoma in situ of left breast: Secondary | ICD-10-CM | POA: Diagnosis not present

## 2020-06-23 DIAGNOSIS — Z170 Estrogen receptor positive status [ER+]: Secondary | ICD-10-CM | POA: Diagnosis not present

## 2020-06-23 DIAGNOSIS — D0511 Intraductal carcinoma in situ of right breast: Secondary | ICD-10-CM | POA: Diagnosis not present

## 2020-06-23 DIAGNOSIS — C50912 Malignant neoplasm of unspecified site of left female breast: Secondary | ICD-10-CM | POA: Diagnosis not present

## 2020-06-24 ENCOUNTER — Ambulatory Visit
Admission: RE | Admit: 2020-06-24 | Discharge: 2020-06-24 | Disposition: A | Discharge status: Home / Self Care | Payer: 59 | Source: Ambulatory Visit | Attending: Radiation Oncology | Admitting: Radiation Oncology

## 2020-06-24 ENCOUNTER — Encounter: Payer: Self-pay | Admitting: *Deleted

## 2020-06-24 DIAGNOSIS — Z170 Estrogen receptor positive status [ER+]: Secondary | ICD-10-CM | POA: Diagnosis not present

## 2020-06-24 DIAGNOSIS — C50912 Malignant neoplasm of unspecified site of left female breast: Secondary | ICD-10-CM | POA: Diagnosis not present

## 2020-06-24 DIAGNOSIS — D0511 Intraductal carcinoma in situ of right breast: Secondary | ICD-10-CM | POA: Diagnosis not present

## 2020-06-24 DIAGNOSIS — C50911 Malignant neoplasm of unspecified site of right female breast: Secondary | ICD-10-CM | POA: Diagnosis not present

## 2020-06-24 DIAGNOSIS — D0512 Intraductal carcinoma in situ of left breast: Secondary | ICD-10-CM | POA: Diagnosis not present

## 2020-06-24 NOTE — Research (Signed)
A Copy of the ICF for the DCP-001 study was given to patient Marissa Phillips this morning for her to take and review and determine whether she wants to participate. A brief overview of the purpose and procedures for the study was provided to patient as well as a copy of the questionnaire/woirksheet she would be completing if she decides to participate. Plans made to touch base with [name] next week and determine whether she is interested in completing the DCP-001 study. Yolande Jolly, BSN, MHA, OCN 06/24/2020 9:15 AM

## 2020-06-27 ENCOUNTER — Ambulatory Visit
Admission: RE | Admit: 2020-06-27 | Discharge: 2020-06-27 | Disposition: A | Discharge status: Home / Self Care | Payer: 59 | Source: Ambulatory Visit | Attending: Radiation Oncology | Admitting: Radiation Oncology

## 2020-06-27 DIAGNOSIS — Z510 Encounter for antineoplastic radiation therapy: Secondary | ICD-10-CM | POA: Diagnosis not present

## 2020-06-27 DIAGNOSIS — Z170 Estrogen receptor positive status [ER+]: Secondary | ICD-10-CM | POA: Diagnosis not present

## 2020-06-27 DIAGNOSIS — C50911 Malignant neoplasm of unspecified site of right female breast: Secondary | ICD-10-CM | POA: Insufficient documentation

## 2020-06-27 DIAGNOSIS — D0511 Intraductal carcinoma in situ of right breast: Secondary | ICD-10-CM | POA: Diagnosis not present

## 2020-06-27 DIAGNOSIS — C50912 Malignant neoplasm of unspecified site of left female breast: Secondary | ICD-10-CM | POA: Diagnosis not present

## 2020-06-27 DIAGNOSIS — D0512 Intraductal carcinoma in situ of left breast: Secondary | ICD-10-CM | POA: Diagnosis not present

## 2020-06-28 ENCOUNTER — Ambulatory Visit
Admission: RE | Admit: 2020-06-28 | Discharge: 2020-06-28 | Disposition: A | Discharge status: Home / Self Care | Payer: 59 | Source: Ambulatory Visit | Attending: Radiation Oncology | Admitting: Radiation Oncology

## 2020-06-28 ENCOUNTER — Other Ambulatory Visit (HOSPITAL_COMMUNITY): Payer: Self-pay | Admitting: Oncology

## 2020-06-28 DIAGNOSIS — C50912 Malignant neoplasm of unspecified site of left female breast: Secondary | ICD-10-CM | POA: Diagnosis not present

## 2020-06-28 DIAGNOSIS — C50911 Malignant neoplasm of unspecified site of right female breast: Secondary | ICD-10-CM | POA: Diagnosis not present

## 2020-06-28 DIAGNOSIS — D0511 Intraductal carcinoma in situ of right breast: Secondary | ICD-10-CM | POA: Diagnosis not present

## 2020-06-28 DIAGNOSIS — D0512 Intraductal carcinoma in situ of left breast: Secondary | ICD-10-CM | POA: Diagnosis not present

## 2020-06-28 DIAGNOSIS — Z006 Encounter for examination for normal comparison and control in clinical research program: Secondary | ICD-10-CM

## 2020-06-28 DIAGNOSIS — Z510 Encounter for antineoplastic radiation therapy: Secondary | ICD-10-CM | POA: Diagnosis not present

## 2020-06-28 DIAGNOSIS — Z170 Estrogen receptor positive status [ER+]: Secondary | ICD-10-CM | POA: Diagnosis not present

## 2020-06-29 ENCOUNTER — Ambulatory Visit
Admission: RE | Admit: 2020-06-29 | Discharge: 2020-06-29 | Disposition: A | Discharge status: Home / Self Care | Payer: 59 | Source: Ambulatory Visit | Attending: Radiation Oncology | Admitting: Radiation Oncology

## 2020-06-29 DIAGNOSIS — Z510 Encounter for antineoplastic radiation therapy: Secondary | ICD-10-CM | POA: Diagnosis not present

## 2020-06-29 DIAGNOSIS — C50912 Malignant neoplasm of unspecified site of left female breast: Secondary | ICD-10-CM | POA: Diagnosis not present

## 2020-06-29 DIAGNOSIS — C50911 Malignant neoplasm of unspecified site of right female breast: Secondary | ICD-10-CM | POA: Diagnosis not present

## 2020-06-30 ENCOUNTER — Ambulatory Visit: Payer: 59

## 2020-06-30 ENCOUNTER — Ambulatory Visit
Admission: RE | Admit: 2020-06-30 | Discharge: 2020-06-30 | Disposition: A | Discharge status: Home / Self Care | Payer: 59 | Source: Ambulatory Visit | Attending: Radiation Oncology | Admitting: Radiation Oncology

## 2020-06-30 DIAGNOSIS — Z510 Encounter for antineoplastic radiation therapy: Secondary | ICD-10-CM | POA: Diagnosis not present

## 2020-06-30 DIAGNOSIS — D0511 Intraductal carcinoma in situ of right breast: Secondary | ICD-10-CM | POA: Diagnosis not present

## 2020-06-30 DIAGNOSIS — C50911 Malignant neoplasm of unspecified site of right female breast: Secondary | ICD-10-CM | POA: Diagnosis not present

## 2020-06-30 DIAGNOSIS — Z170 Estrogen receptor positive status [ER+]: Secondary | ICD-10-CM | POA: Diagnosis not present

## 2020-06-30 DIAGNOSIS — C50912 Malignant neoplasm of unspecified site of left female breast: Secondary | ICD-10-CM | POA: Diagnosis not present

## 2020-06-30 DIAGNOSIS — D0512 Intraductal carcinoma in situ of left breast: Secondary | ICD-10-CM | POA: Diagnosis not present

## 2020-07-01 ENCOUNTER — Ambulatory Visit
Admission: RE | Admit: 2020-07-01 | Discharge: 2020-07-01 | Disposition: A | Discharge status: Home / Self Care | Payer: 59 | Source: Ambulatory Visit | Attending: Radiation Oncology | Admitting: Radiation Oncology

## 2020-07-01 DIAGNOSIS — D0511 Intraductal carcinoma in situ of right breast: Secondary | ICD-10-CM | POA: Diagnosis not present

## 2020-07-01 DIAGNOSIS — Z170 Estrogen receptor positive status [ER+]: Secondary | ICD-10-CM | POA: Diagnosis not present

## 2020-07-01 DIAGNOSIS — C50911 Malignant neoplasm of unspecified site of right female breast: Secondary | ICD-10-CM | POA: Diagnosis not present

## 2020-07-01 DIAGNOSIS — C50912 Malignant neoplasm of unspecified site of left female breast: Secondary | ICD-10-CM | POA: Diagnosis not present

## 2020-07-01 DIAGNOSIS — D0512 Intraductal carcinoma in situ of left breast: Secondary | ICD-10-CM | POA: Diagnosis not present

## 2020-07-01 DIAGNOSIS — Z510 Encounter for antineoplastic radiation therapy: Secondary | ICD-10-CM | POA: Diagnosis not present

## 2020-07-04 ENCOUNTER — Ambulatory Visit
Admission: RE | Admit: 2020-07-04 | Discharge: 2020-07-04 | Disposition: A | Discharge status: Home / Self Care | Payer: 59 | Source: Ambulatory Visit | Attending: Radiation Oncology | Admitting: Radiation Oncology

## 2020-07-04 DIAGNOSIS — Z510 Encounter for antineoplastic radiation therapy: Secondary | ICD-10-CM | POA: Diagnosis not present

## 2020-07-04 DIAGNOSIS — C50912 Malignant neoplasm of unspecified site of left female breast: Secondary | ICD-10-CM | POA: Diagnosis not present

## 2020-07-04 DIAGNOSIS — D0512 Intraductal carcinoma in situ of left breast: Secondary | ICD-10-CM | POA: Diagnosis not present

## 2020-07-04 DIAGNOSIS — D0511 Intraductal carcinoma in situ of right breast: Secondary | ICD-10-CM | POA: Diagnosis not present

## 2020-07-04 DIAGNOSIS — C50911 Malignant neoplasm of unspecified site of right female breast: Secondary | ICD-10-CM | POA: Diagnosis not present

## 2020-07-04 DIAGNOSIS — Z170 Estrogen receptor positive status [ER+]: Secondary | ICD-10-CM | POA: Diagnosis not present

## 2020-07-05 ENCOUNTER — Ambulatory Visit
Admission: RE | Admit: 2020-07-05 | Discharge: 2020-07-05 | Disposition: A | Discharge status: Home / Self Care | Payer: 59 | Source: Ambulatory Visit | Attending: Radiation Oncology | Admitting: Radiation Oncology

## 2020-07-05 DIAGNOSIS — Z170 Estrogen receptor positive status [ER+]: Secondary | ICD-10-CM | POA: Diagnosis not present

## 2020-07-05 DIAGNOSIS — C50911 Malignant neoplasm of unspecified site of right female breast: Secondary | ICD-10-CM | POA: Diagnosis not present

## 2020-07-05 DIAGNOSIS — Z510 Encounter for antineoplastic radiation therapy: Secondary | ICD-10-CM | POA: Diagnosis not present

## 2020-07-05 DIAGNOSIS — D0511 Intraductal carcinoma in situ of right breast: Secondary | ICD-10-CM | POA: Diagnosis not present

## 2020-07-05 DIAGNOSIS — D0512 Intraductal carcinoma in situ of left breast: Secondary | ICD-10-CM | POA: Diagnosis not present

## 2020-07-05 DIAGNOSIS — C50912 Malignant neoplasm of unspecified site of left female breast: Secondary | ICD-10-CM | POA: Diagnosis not present

## 2020-07-06 ENCOUNTER — Ambulatory Visit
Admission: RE | Admit: 2020-07-06 | Discharge: 2020-07-06 | Disposition: A | Discharge status: Home / Self Care | Payer: 59 | Source: Ambulatory Visit | Attending: Radiation Oncology | Admitting: Radiation Oncology

## 2020-07-06 DIAGNOSIS — C50911 Malignant neoplasm of unspecified site of right female breast: Secondary | ICD-10-CM | POA: Diagnosis not present

## 2020-07-06 DIAGNOSIS — C50912 Malignant neoplasm of unspecified site of left female breast: Secondary | ICD-10-CM | POA: Diagnosis not present

## 2020-07-06 DIAGNOSIS — D0511 Intraductal carcinoma in situ of right breast: Secondary | ICD-10-CM | POA: Diagnosis not present

## 2020-07-06 DIAGNOSIS — Z510 Encounter for antineoplastic radiation therapy: Secondary | ICD-10-CM | POA: Diagnosis not present

## 2020-07-06 DIAGNOSIS — D0512 Intraductal carcinoma in situ of left breast: Secondary | ICD-10-CM | POA: Diagnosis not present

## 2020-07-06 DIAGNOSIS — Z170 Estrogen receptor positive status [ER+]: Secondary | ICD-10-CM | POA: Diagnosis not present

## 2020-07-07 ENCOUNTER — Ambulatory Visit
Admission: RE | Admit: 2020-07-07 | Discharge: 2020-07-07 | Disposition: A | Discharge status: Home / Self Care | Payer: 59 | Source: Ambulatory Visit | Attending: Radiation Oncology | Admitting: Radiation Oncology

## 2020-07-07 DIAGNOSIS — C50911 Malignant neoplasm of unspecified site of right female breast: Secondary | ICD-10-CM | POA: Diagnosis not present

## 2020-07-07 DIAGNOSIS — Z510 Encounter for antineoplastic radiation therapy: Secondary | ICD-10-CM | POA: Diagnosis not present

## 2020-07-07 DIAGNOSIS — C50912 Malignant neoplasm of unspecified site of left female breast: Secondary | ICD-10-CM | POA: Diagnosis not present

## 2020-07-08 ENCOUNTER — Ambulatory Visit
Admission: RE | Admit: 2020-07-08 | Discharge: 2020-07-08 | Disposition: A | Discharge status: Home / Self Care | Payer: 59 | Source: Ambulatory Visit | Attending: Radiation Oncology | Admitting: Radiation Oncology

## 2020-07-08 DIAGNOSIS — D0511 Intraductal carcinoma in situ of right breast: Secondary | ICD-10-CM | POA: Diagnosis not present

## 2020-07-08 DIAGNOSIS — D0512 Intraductal carcinoma in situ of left breast: Secondary | ICD-10-CM | POA: Diagnosis not present

## 2020-07-08 DIAGNOSIS — C50912 Malignant neoplasm of unspecified site of left female breast: Secondary | ICD-10-CM | POA: Diagnosis not present

## 2020-07-08 DIAGNOSIS — Z170 Estrogen receptor positive status [ER+]: Secondary | ICD-10-CM | POA: Diagnosis not present

## 2020-07-08 DIAGNOSIS — C50911 Malignant neoplasm of unspecified site of right female breast: Secondary | ICD-10-CM | POA: Diagnosis not present

## 2020-07-08 DIAGNOSIS — Z510 Encounter for antineoplastic radiation therapy: Secondary | ICD-10-CM | POA: Diagnosis not present

## 2020-07-11 ENCOUNTER — Ambulatory Visit: Admission: RE | Admit: 2020-07-11 | Payer: 59 | Source: Ambulatory Visit

## 2020-07-11 ENCOUNTER — Ambulatory Visit
Admission: RE | Admit: 2020-07-11 | Discharge: 2020-07-11 | Disposition: A | Discharge status: Home / Self Care | Payer: 59 | Source: Ambulatory Visit | Attending: Radiation Oncology | Admitting: Radiation Oncology

## 2020-07-11 DIAGNOSIS — C50912 Malignant neoplasm of unspecified site of left female breast: Secondary | ICD-10-CM | POA: Diagnosis not present

## 2020-07-11 DIAGNOSIS — D0511 Intraductal carcinoma in situ of right breast: Secondary | ICD-10-CM | POA: Diagnosis not present

## 2020-07-11 DIAGNOSIS — Z510 Encounter for antineoplastic radiation therapy: Secondary | ICD-10-CM | POA: Diagnosis not present

## 2020-07-11 DIAGNOSIS — C50911 Malignant neoplasm of unspecified site of right female breast: Secondary | ICD-10-CM | POA: Diagnosis not present

## 2020-07-11 DIAGNOSIS — D0512 Intraductal carcinoma in situ of left breast: Secondary | ICD-10-CM | POA: Diagnosis not present

## 2020-07-11 DIAGNOSIS — Z170 Estrogen receptor positive status [ER+]: Secondary | ICD-10-CM | POA: Diagnosis not present

## 2020-07-12 ENCOUNTER — Ambulatory Visit
Admission: RE | Admit: 2020-07-12 | Discharge: 2020-07-12 | Disposition: A | Discharge status: Home / Self Care | Payer: 59 | Source: Ambulatory Visit | Attending: Radiation Oncology | Admitting: Radiation Oncology

## 2020-07-12 DIAGNOSIS — C50911 Malignant neoplasm of unspecified site of right female breast: Secondary | ICD-10-CM | POA: Diagnosis not present

## 2020-07-12 DIAGNOSIS — D0512 Intraductal carcinoma in situ of left breast: Secondary | ICD-10-CM | POA: Diagnosis not present

## 2020-07-12 DIAGNOSIS — Z510 Encounter for antineoplastic radiation therapy: Secondary | ICD-10-CM | POA: Diagnosis not present

## 2020-07-12 DIAGNOSIS — D0511 Intraductal carcinoma in situ of right breast: Secondary | ICD-10-CM | POA: Diagnosis not present

## 2020-07-12 DIAGNOSIS — Z170 Estrogen receptor positive status [ER+]: Secondary | ICD-10-CM | POA: Diagnosis not present

## 2020-07-12 DIAGNOSIS — C50912 Malignant neoplasm of unspecified site of left female breast: Secondary | ICD-10-CM | POA: Diagnosis not present

## 2020-07-13 ENCOUNTER — Ambulatory Visit
Admission: RE | Admit: 2020-07-13 | Discharge: 2020-07-13 | Disposition: A | Discharge status: Home / Self Care | Payer: 59 | Source: Ambulatory Visit | Attending: Radiation Oncology | Admitting: Radiation Oncology

## 2020-07-13 DIAGNOSIS — C50911 Malignant neoplasm of unspecified site of right female breast: Secondary | ICD-10-CM | POA: Diagnosis not present

## 2020-07-13 DIAGNOSIS — D0511 Intraductal carcinoma in situ of right breast: Secondary | ICD-10-CM | POA: Diagnosis not present

## 2020-07-13 DIAGNOSIS — Z170 Estrogen receptor positive status [ER+]: Secondary | ICD-10-CM | POA: Diagnosis not present

## 2020-07-13 DIAGNOSIS — C50912 Malignant neoplasm of unspecified site of left female breast: Secondary | ICD-10-CM | POA: Diagnosis not present

## 2020-07-13 DIAGNOSIS — D0512 Intraductal carcinoma in situ of left breast: Secondary | ICD-10-CM | POA: Diagnosis not present

## 2020-07-13 DIAGNOSIS — Z510 Encounter for antineoplastic radiation therapy: Secondary | ICD-10-CM | POA: Diagnosis not present

## 2020-07-14 ENCOUNTER — Other Ambulatory Visit: Payer: Self-pay

## 2020-07-14 ENCOUNTER — Ambulatory Visit (HOSPITAL_COMMUNITY)
Admission: RE | Admit: 2020-07-14 | Discharge: 2020-07-14 | Disposition: A | Discharge status: Home / Self Care | Payer: Self-pay | Source: Ambulatory Visit | Attending: Oncology | Admitting: Oncology

## 2020-07-14 ENCOUNTER — Inpatient Hospital Stay: Payer: 59 | Attending: Oncology

## 2020-07-14 ENCOUNTER — Ambulatory Visit
Admission: RE | Admit: 2020-07-14 | Discharge: 2020-07-14 | Disposition: A | Discharge status: Home / Self Care | Payer: 59 | Source: Ambulatory Visit | Attending: Radiation Oncology | Admitting: Radiation Oncology

## 2020-07-14 ENCOUNTER — Encounter: Payer: Self-pay | Admitting: *Deleted

## 2020-07-14 DIAGNOSIS — D0512 Intraductal carcinoma in situ of left breast: Secondary | ICD-10-CM | POA: Diagnosis not present

## 2020-07-14 DIAGNOSIS — Z006 Encounter for examination for normal comparison and control in clinical research program: Secondary | ICD-10-CM

## 2020-07-14 DIAGNOSIS — C50912 Malignant neoplasm of unspecified site of left female breast: Secondary | ICD-10-CM | POA: Diagnosis not present

## 2020-07-14 DIAGNOSIS — D0511 Intraductal carcinoma in situ of right breast: Secondary | ICD-10-CM | POA: Diagnosis not present

## 2020-07-14 DIAGNOSIS — C50911 Malignant neoplasm of unspecified site of right female breast: Secondary | ICD-10-CM | POA: Insufficient documentation

## 2020-07-14 DIAGNOSIS — Z170 Estrogen receptor positive status [ER+]: Secondary | ICD-10-CM | POA: Diagnosis not present

## 2020-07-14 DIAGNOSIS — I429 Cardiomyopathy, unspecified: Secondary | ICD-10-CM

## 2020-07-14 DIAGNOSIS — Z510 Encounter for antineoplastic radiation therapy: Secondary | ICD-10-CM | POA: Diagnosis not present

## 2020-07-14 HISTORY — DX: Cardiomyopathy, unspecified: I42.9

## 2020-07-14 NOTE — Research (Signed)
Marissa Phillips presented to the [facility] this afternoon as scheduled to complete all baseline assessments and Central study lab collection for the [place] UPBEAT study. Prior to coming here, she went to [facility] and completed her baseline Cardiac MRI per protocol. Patient reports she has been fasting since 11:00 AM. Study labs were drawn at exactly 2:00pm and were taken to [facility] for processing. Patient was then taken upstairs to have her height, weight, blood pressure and waist circumference measured. Marissa Phillips's height is 66 inches while standing straight and looking forward with shoes off. Her weight was 200.7 lbs in light clothing again without shoes. Patient was then taken to a quiet room and allowed to rest for 5 minutes prior to checking blood pressure x 2. Her blood pressure measured at 118/77 and heart rate was 97 bpm with patient sitting straight up and back supported, feet on the floor and legs uncrossed. After one minute more, her second blood pressure was measured at 107/72 and hr 96 bpm. Patient's BMI was calculated at 32.4 per the [email or web address]://www.hartman-hill.biz/ website. Waist circumference measured 41 inches measuring at the level of the umbilicus.  Patient voiced that she felt comfortable performing each of the physical performance tests. She then completed the 6-minute walk and Physical performance tests. She also completed the neurocognitive assessment prior to having to stop for her scheduled radiation therapy appointment. After this, the patient completed the Baseline Self-administered Questionnaires, the [facility] cardiomyopathy Questionnaire and the new COVID-19 Questionnaire on paper in the clinic. She will continue this method as she did not opt for the email questionnaires. Marissa Phillips was thanked for participating in research, and was given her $25 gift card for which she signed as having received. She was also given an UPBEAT tote bag, a breast cancer sticker and her study calendar magnet marked  with the months when her study visits will be performed in the future. Plans made to call patient to schedule her one month labs after she completed RT. States she will begin taking her AI on 07/22/2020 and this will mark her official start date for the UPBEAT study. Central study labs were processed and shipped out today partly to [facility] and partly to [place] [facility] per protocol instructions. Marissa Phillips, BSN, MHA, OCN 07/14/2020 3:32 PM

## 2020-07-15 ENCOUNTER — Encounter: Payer: Self-pay | Admitting: Oncology

## 2020-07-15 ENCOUNTER — Ambulatory Visit
Admission: RE | Admit: 2020-07-15 | Discharge: 2020-07-15 | Disposition: A | Discharge status: Home / Self Care | Payer: 59 | Source: Ambulatory Visit | Attending: Radiation Oncology | Admitting: Radiation Oncology

## 2020-07-15 DIAGNOSIS — C50912 Malignant neoplasm of unspecified site of left female breast: Secondary | ICD-10-CM | POA: Diagnosis not present

## 2020-07-15 DIAGNOSIS — D0512 Intraductal carcinoma in situ of left breast: Secondary | ICD-10-CM | POA: Diagnosis not present

## 2020-07-15 DIAGNOSIS — D0511 Intraductal carcinoma in situ of right breast: Secondary | ICD-10-CM | POA: Diagnosis not present

## 2020-07-15 DIAGNOSIS — C50911 Malignant neoplasm of unspecified site of right female breast: Secondary | ICD-10-CM | POA: Diagnosis not present

## 2020-07-15 DIAGNOSIS — Z170 Estrogen receptor positive status [ER+]: Secondary | ICD-10-CM | POA: Diagnosis not present

## 2020-07-15 DIAGNOSIS — Z510 Encounter for antineoplastic radiation therapy: Secondary | ICD-10-CM | POA: Diagnosis not present

## 2020-07-18 ENCOUNTER — Ambulatory Visit
Admission: RE | Admit: 2020-07-18 | Discharge: 2020-07-18 | Disposition: A | Discharge status: Home / Self Care | Payer: 59 | Source: Ambulatory Visit | Attending: Radiation Oncology | Admitting: Radiation Oncology

## 2020-07-18 DIAGNOSIS — C50911 Malignant neoplasm of unspecified site of right female breast: Secondary | ICD-10-CM | POA: Diagnosis not present

## 2020-07-18 DIAGNOSIS — D0512 Intraductal carcinoma in situ of left breast: Secondary | ICD-10-CM | POA: Diagnosis not present

## 2020-07-18 DIAGNOSIS — C50912 Malignant neoplasm of unspecified site of left female breast: Secondary | ICD-10-CM | POA: Diagnosis not present

## 2020-07-18 DIAGNOSIS — Z170 Estrogen receptor positive status [ER+]: Secondary | ICD-10-CM | POA: Diagnosis not present

## 2020-07-18 DIAGNOSIS — Z510 Encounter for antineoplastic radiation therapy: Secondary | ICD-10-CM | POA: Diagnosis not present

## 2020-07-18 DIAGNOSIS — D0511 Intraductal carcinoma in situ of right breast: Secondary | ICD-10-CM | POA: Diagnosis not present

## 2020-07-19 ENCOUNTER — Ambulatory Visit
Admission: RE | Admit: 2020-07-19 | Discharge: 2020-07-19 | Disposition: A | Discharge status: Home / Self Care | Payer: 59 | Source: Ambulatory Visit | Attending: Radiation Oncology | Admitting: Radiation Oncology

## 2020-07-19 DIAGNOSIS — Z170 Estrogen receptor positive status [ER+]: Secondary | ICD-10-CM | POA: Diagnosis not present

## 2020-07-19 DIAGNOSIS — D0511 Intraductal carcinoma in situ of right breast: Secondary | ICD-10-CM | POA: Diagnosis not present

## 2020-07-19 DIAGNOSIS — C50911 Malignant neoplasm of unspecified site of right female breast: Secondary | ICD-10-CM | POA: Diagnosis not present

## 2020-07-19 DIAGNOSIS — C50912 Malignant neoplasm of unspecified site of left female breast: Secondary | ICD-10-CM | POA: Diagnosis not present

## 2020-07-19 DIAGNOSIS — D0512 Intraductal carcinoma in situ of left breast: Secondary | ICD-10-CM | POA: Diagnosis not present

## 2020-07-19 DIAGNOSIS — Z510 Encounter for antineoplastic radiation therapy: Secondary | ICD-10-CM | POA: Diagnosis not present

## 2020-07-20 ENCOUNTER — Ambulatory Visit
Admission: RE | Admit: 2020-07-20 | Discharge: 2020-07-20 | Disposition: A | Discharge status: Home / Self Care | Payer: 59 | Source: Ambulatory Visit | Attending: Radiation Oncology | Admitting: Radiation Oncology

## 2020-07-20 DIAGNOSIS — C50911 Malignant neoplasm of unspecified site of right female breast: Secondary | ICD-10-CM | POA: Diagnosis not present

## 2020-07-20 DIAGNOSIS — D0511 Intraductal carcinoma in situ of right breast: Secondary | ICD-10-CM | POA: Diagnosis not present

## 2020-07-20 DIAGNOSIS — Z510 Encounter for antineoplastic radiation therapy: Secondary | ICD-10-CM | POA: Diagnosis not present

## 2020-07-20 DIAGNOSIS — Z170 Estrogen receptor positive status [ER+]: Secondary | ICD-10-CM | POA: Diagnosis not present

## 2020-07-20 DIAGNOSIS — D0512 Intraductal carcinoma in situ of left breast: Secondary | ICD-10-CM | POA: Diagnosis not present

## 2020-07-20 DIAGNOSIS — C50912 Malignant neoplasm of unspecified site of left female breast: Secondary | ICD-10-CM | POA: Diagnosis not present

## 2020-07-21 ENCOUNTER — Ambulatory Visit
Admission: RE | Admit: 2020-07-21 | Discharge: 2020-07-21 | Disposition: A | Discharge status: Home / Self Care | Payer: 59 | Source: Ambulatory Visit | Attending: Radiation Oncology | Admitting: Radiation Oncology

## 2020-07-21 DIAGNOSIS — D0512 Intraductal carcinoma in situ of left breast: Secondary | ICD-10-CM | POA: Diagnosis not present

## 2020-07-21 DIAGNOSIS — C50912 Malignant neoplasm of unspecified site of left female breast: Secondary | ICD-10-CM | POA: Diagnosis not present

## 2020-07-21 DIAGNOSIS — Z510 Encounter for antineoplastic radiation therapy: Secondary | ICD-10-CM | POA: Diagnosis not present

## 2020-07-21 DIAGNOSIS — Z170 Estrogen receptor positive status [ER+]: Secondary | ICD-10-CM | POA: Diagnosis not present

## 2020-07-21 DIAGNOSIS — D0511 Intraductal carcinoma in situ of right breast: Secondary | ICD-10-CM | POA: Diagnosis not present

## 2020-07-21 DIAGNOSIS — C50911 Malignant neoplasm of unspecified site of right female breast: Secondary | ICD-10-CM | POA: Diagnosis not present

## 2020-07-26 ENCOUNTER — Encounter: Payer: Self-pay | Admitting: Hematology and Oncology

## 2020-07-29 ENCOUNTER — Encounter: Payer: Self-pay | Admitting: *Deleted

## 2020-07-29 DIAGNOSIS — C50912 Malignant neoplasm of unspecified site of left female breast: Secondary | ICD-10-CM

## 2020-07-29 DIAGNOSIS — C50911 Malignant neoplasm of unspecified site of right female breast: Secondary | ICD-10-CM

## 2020-07-29 NOTE — Research (Signed)
T/C made to [name] as planned this morning for purpose of obtaining phone consent and completion of the DCP-001 study. Marissa Phillips was given a copy of the informed consent form a few weeks ago, and another copy was sent via email to her yesterday in order for patient to determine her decision whether to participate. She responded back via email after reading over the consent and has decided to participate. She does not have another clinic appointment in the near future, therefore phone consent and questionnaire completion is necessary. After properly identifying the patient, she was informed that she would be placed on speaker phone in order for Jeral Fruit, RN to witness her verbal consent. Phone script was reviewed with patient along with study consent and HIPAA. Patient verified that she has read and understands the informed consent form and HIPAA and has no further questions at this time. Reminded that participation is voluntary, that she will not be paid for participation and that she can withdraw at any time. Verbal consent received from Marissa Phillips to participate in the [place] DCP-001 study, ICF Protocol Version Date: 05/19/19 and corresponding [place] HIPAA dated 01/17/15 on 07/29/2020 at 9:21 AM. This consent was witnessed by Jeral Fruit, RN. Patient completed the DCP-001 worksheet while on the call and was informed that witnessed copies of the consent forms she verbally agreed to will be sent to her via email unless she prefers a hard copy. Marissa Phillips agreed to receive her copies via email. Yolande Jolly, BSN, MHA, OCN 07/29/2020 9:30 AM

## 2020-08-22 ENCOUNTER — Inpatient Hospital Stay: Payer: 59 | Attending: Oncology

## 2020-08-22 ENCOUNTER — Other Ambulatory Visit: Payer: Self-pay

## 2020-08-22 ENCOUNTER — Ambulatory Visit
Admission: RE | Admit: 2020-08-22 | Discharge: 2020-08-22 | Disposition: A | Discharge status: Home / Self Care | Payer: 59 | Source: Ambulatory Visit | Attending: Radiation Oncology | Admitting: Radiation Oncology

## 2020-08-22 ENCOUNTER — Encounter: Payer: Self-pay | Admitting: Radiation Oncology

## 2020-08-22 ENCOUNTER — Encounter: Payer: Self-pay | Admitting: *Deleted

## 2020-08-22 VITALS — BP 126/81 | HR 90 | Temp 97.1°F | Resp 16 | Wt 203.2 lb

## 2020-08-22 DIAGNOSIS — Z170 Estrogen receptor positive status [ER+]: Secondary | ICD-10-CM | POA: Insufficient documentation

## 2020-08-22 DIAGNOSIS — D0511 Intraductal carcinoma in situ of right breast: Secondary | ICD-10-CM | POA: Diagnosis not present

## 2020-08-22 DIAGNOSIS — C50911 Malignant neoplasm of unspecified site of right female breast: Secondary | ICD-10-CM

## 2020-08-22 DIAGNOSIS — Z923 Personal history of irradiation: Secondary | ICD-10-CM | POA: Insufficient documentation

## 2020-08-22 DIAGNOSIS — Z79811 Long term (current) use of aromatase inhibitors: Secondary | ICD-10-CM | POA: Diagnosis not present

## 2020-08-22 DIAGNOSIS — D0512 Intraductal carcinoma in situ of left breast: Secondary | ICD-10-CM | POA: Diagnosis not present

## 2020-08-22 DIAGNOSIS — C50912 Malignant neoplasm of unspecified site of left female breast: Secondary | ICD-10-CM

## 2020-08-22 NOTE — Progress Notes (Signed)
Radiation Oncology Follow up Note  Name: Marissa Phillips   Date:   08/22/2020 MRN:  [id number] DOB: 27-Oct-1961    This 59 y.o. female presents to the clinic today for 1 month follow-up status post bilateral breast radiation in patient previously treated in 2006 to her right breast with whole breast radiation received external beam partial breast radiation to her right breast.  REFERRING PROVIDER: Idelle Crouch, MD  HPI: Patient is a 59 year old female.  She is 1 month out from partial breast radiation to her right breast for stage I invasive mammary carcinoma in her breast previously radiated back in 2006 for early stage invasive mammary carcinoma.  She also received whole breast radiation again for a stage T1 lesion status post wide local excision.  Both breasts were ER positive PR negative HER-2/neu not overexpressed.  Seen today in routine follow-up she is doing well specifically denies breast tenderness cough or bone pain.  Still has some slight tenderness in the left breast and had some prolonged erythematous change of the skin in the inframammary portion which now has cleared completely..  Patient is started Aromasin is tolerating it well without side effect.  COMPLICATIONS OF TREATMENT: none  FOLLOW UP COMPLIANCE: keeps appointments   PHYSICAL EXAM:  BP 126/81 (BP Location: Right Arm, Patient Position: Sitting, Cuff Size: Large)   Pulse 90   Temp (!) 97.1 F (36.2 C) (Tympanic)   Resp 16   Wt 203 lb 3.2 oz (92.2 kg)   LMP  (LMP Unknown)   BMI 32.80 kg/m  Lungs are clear to A&P cardiac examination essentially unremarkable with regular rate and rhythm. No dominant mass or nodularity is noted in either breast in 2 positions examined. Incision is well-healed. No axillary or supraclavicular adenopathy is appreciated. Cosmetic result is excellent.  Well-developed well-nourished patient in NAD. HEENT reveals PERLA, EOMI, discs not visualized.  Oral cavity is clear. No oral mucosal  lesions are identified. Neck is clear without evidence of cervical or supraclavicular adenopathy. Lungs are clear to A&P. Cardiac examination is essentially unremarkable with regular rate and rhythm without murmur rub or thrill. Abdomen is benign with no organomegaly or masses noted. Motor sensory and DTR levels are equal and symmetric in the upper and lower extremities. Cranial nerves II through XII are grossly intact. Proprioception is intact. No peripheral adenopathy or edema is identified. No motor or sensory levels are noted. Crude visual fields are within normal range.  RADIOLOGY RESULTS: No current films to review  PLAN: Present time patient is doing well excellent cosmetic result bilaterally.  No significant side effects and previously radiated right breast.  I am pleased with her overall progress.  She continues on Aromasin without side effect.  I have asked to see her back in 4 to 5 months for follow-up.  Patient is to call with any concerns.  I would like to take this opportunity to thank you for allowing me to participate in the care of your patient.Noreene Filbert, MD

## 2020-08-22 NOTE — Research (Signed)
Marissa Phillips returns to clinic this morning for her one month follow up with Dr. Baruch Gouty in Radiation Oncology, and her one month Study labs and Cardiac questionnaire for the [place] UPBEAT study. Patient reports she has been fasting since 7:30 this morning. She had study labs collected at 10:40 and these will be processed locally and shipped out to [facility] on dry ice this afternoon. Patient was also asked the questions on the Cardiac Event form and she denies having been hospitalized for or experiencing any of the cardiac events listed including MI, PCI, CABG, Cardiac Cath, Stroke or Heart failure. Reviewed with Marissa Phillips that her next study evaluation will be at 3 months, and she will have another Cardiac MRI at that time. Patient was thanked for participating in Research. Marissa Phillips, BSN, MHA, OCN 08/22/2020 10:55 AM

## 2020-08-29 ENCOUNTER — Ambulatory Visit: Payer: 59 | Admitting: Radiation Oncology

## 2020-09-06 ENCOUNTER — Other Ambulatory Visit: Payer: 59

## 2020-09-16 ENCOUNTER — Other Ambulatory Visit: Payer: 59

## 2020-09-16 ENCOUNTER — Ambulatory Visit: Payer: 59 | Admitting: Oncology

## 2020-09-26 ENCOUNTER — Other Ambulatory Visit: Payer: 59

## 2020-09-26 ENCOUNTER — Ambulatory Visit: Payer: 59 | Admitting: Oncology

## 2020-09-28 ENCOUNTER — Other Ambulatory Visit: Payer: Self-pay

## 2020-09-28 DIAGNOSIS — C50911 Malignant neoplasm of unspecified site of right female breast: Secondary | ICD-10-CM

## 2020-09-29 ENCOUNTER — Other Ambulatory Visit: Payer: Self-pay

## 2020-09-29 ENCOUNTER — Inpatient Hospital Stay (HOSPITAL_BASED_OUTPATIENT_CLINIC_OR_DEPARTMENT_OTHER): Payer: 59 | Admitting: Oncology

## 2020-09-29 ENCOUNTER — Inpatient Hospital Stay: Payer: 59 | Attending: Oncology

## 2020-09-29 ENCOUNTER — Encounter: Payer: Self-pay | Admitting: Oncology

## 2020-09-29 VITALS — BP 127/79 | HR 86 | Temp 97.8°F | Resp 16 | Ht 66.0 in | Wt 206.3 lb

## 2020-09-29 DIAGNOSIS — Z923 Personal history of irradiation: Secondary | ICD-10-CM | POA: Insufficient documentation

## 2020-09-29 DIAGNOSIS — Z006 Encounter for examination for normal comparison and control in clinical research program: Secondary | ICD-10-CM | POA: Diagnosis not present

## 2020-09-29 DIAGNOSIS — Z79811 Long term (current) use of aromatase inhibitors: Secondary | ICD-10-CM

## 2020-09-29 DIAGNOSIS — Z170 Estrogen receptor positive status [ER+]: Secondary | ICD-10-CM | POA: Insufficient documentation

## 2020-09-29 DIAGNOSIS — C50911 Malignant neoplasm of unspecified site of right female breast: Secondary | ICD-10-CM

## 2020-09-29 DIAGNOSIS — C50912 Malignant neoplasm of unspecified site of left female breast: Secondary | ICD-10-CM | POA: Diagnosis not present

## 2020-09-29 LAB — COMPREHENSIVE METABOLIC PANEL
ALT: 19 U/L (ref 0–44)
AST: 23 U/L (ref 15–41)
Albumin: 4.2 g/dL (ref 3.5–5.0)
Alkaline Phosphatase: 77 U/L (ref 38–126)
Anion gap: 6 (ref 5–15)
BUN: 14 mg/dL (ref 6–20)
CO2: 29 mmol/L (ref 22–32)
Calcium: 9.4 mg/dL (ref 8.9–10.3)
Chloride: 104 mmol/L (ref 98–111)
Creatinine, Ser: 0.75 mg/dL (ref 0.44–1.00)
GFR, Estimated: >60 mL/min (ref >60)
Glucose, Bld: 92 mg/dL (ref 70–99)
Potassium: 3.7 mmol/L (ref 3.5–5.1)
Sodium: 139 mmol/L (ref 135–145)
Total Bilirubin: 0.5 mg/dL (ref 0.3–1.2)
Total Protein: 7.5 g/dL (ref 6.5–8.1)

## 2020-09-29 NOTE — Progress Notes (Signed)
Pt has soreness under the right axilla from seroma but no pain. She states that since starting the AI she has been more tired than usual and not sleeping as well either

## 2020-09-29 NOTE — Research (Addendum)
WF [address] 3 Month Protocol Visit:   Patient came in to the cancer center this morning for her scheduled appointment for labs and follow up with Dr. Janese Banks for study participation in the [place] " Upbeat" protocol. She states that she has been fasting this morning since 0656 am. She instructed lab personnel that she would be able to have her research labs drawn at 1000 am or after. Research labs were drawn for DNA , serum and plasma at 1001 am and 1002 am. Blood for research labs to be sent to [facility] were drawn at 1003 am and 1004am per protocol requirements. The patient completed her self administered questionnaires, the KCCQ-12 and the Covid-19 questionnaires prior to her visit with Dr. Janese Banks. The patients height and weight were then obtained with her shoes off, standing straight and forward for height. Weight was 206.3 with shoes and 204.9 without shoes. Height was 66 inches without her shoes on. Her waist circumference was measured at 40 in and BMI was calculated using the NIH web site at 33.1. Her blood pressure was obtained in her right arm with her legs uncrossed, sitting up straight next and was 127/79 , pulse of 86. Blood pressure was obtained one minute later at 129/86 pulse of 85 per protocol specified time frame. The CV medication review form and other medications form were reviewed and completed as well as the Cardiac Events Form. Dr. Janese Banks assessed the patient and then the patient was assisted to the research department office to complete her protocol related activities. The SPPB (short physical performance battery)  was completed without any difficulty, including the 6 minute walk and gait walk assessment. The patient was able to complete all activities with the highest required time for each. The neurocognitive tests were then administered by this RN with the assistance of [employer], [place] for timing activities. The informed consent addendum # 6 dated 06-30-2020 was reviewed with the patient in detail,  the patient agrees to have her questionnaires emailed to her home email and participate in the 24 month questionnaire added as well. A copy of this consent will be provided to the patient at her next visit for Upbeat. The patient was given her Upbeat gift card and initialed the receipt form, #[id number]. The research schedule was reviewed with the patient, she will be notified of her cardiac MRI appointment when it is scheduled. She request not next Monday or the day before Thanksgiving. Research nurse will schedule this at the next time available at [facility] for the [place]. The patient is aware that her next study visit will be at the 12 month time point for the protocol. Research nurse thanked the patient for her participation in clinical trials. Approximately 3 hours time were spent with the patient in research related activities today.   Jeral Fruit, RN, BSN, OCN Date: 09/29/2020 Time:1502 am   A copy of the patients Cardiac MRI results from [facility] for her baseline assessment was received on Thursday 09/29/2020 with review by Dr. Brock Bad for the [facility] [address] Upbeat Protocol. The results were listed as abnormal with an EF of 43.7% , mild to moderate reduction. The results were reviewed by Dr. Janese Banks today. She states she will consult cardiology regarding these results. Dr. Janese Banks is aware the patient is scheduled for her 3 month cardiac MRI on Monday, October 10, 2020 at 0900 am at [facility]. The patient was notified of the schedule today and was in agreement with the time and date, voiced  no need to change the schedule.  Jeral Fruit, RN, BSN, OCN Date:10/03/2020   Time: 1217 pm

## 2020-10-03 ENCOUNTER — Other Ambulatory Visit (HOSPITAL_COMMUNITY): Payer: Self-pay | Admitting: Oncology

## 2020-10-03 DIAGNOSIS — C50919 Malignant neoplasm of unspecified site of unspecified female breast: Secondary | ICD-10-CM

## 2020-10-04 ENCOUNTER — Telehealth: Payer: Self-pay | Admitting: *Deleted

## 2020-10-04 DIAGNOSIS — C50911 Malignant neoplasm of unspecified site of right female breast: Secondary | ICD-10-CM

## 2020-10-04 NOTE — Telephone Encounter (Signed)
Called pt to let her know that dr Janese Banks just saw the results of mri EF % that was done several months ago. Pt is scheduled for another one soon. Dr. Janese Banks has asked Ivin Booty and will contact the physician that does the results so that she can be in the loop on a more timely basis. The EF this time was 43%from the one she read and she did not have any other scans to see if it is different or not. If the next one is still 43 % then she will send her to cardiology. Marlana Salvage states that she thinks it was like that when she had her first breast cancer. I told her I would look it up and see if I can find anything. She says it was 2005-2006. I did find one that she was 49 %2005, 53% 2006 . Pt states that she does get sob at times if she walks a lot but she is older and overweight. She said that Dr.Pandit had sent her to have EF checked due to swollen ankles and she has had phlebitis several times and it comes and goes with swelling ankles but she does not think she has cardiac issues. Once they see the results of the mri coming up then if Dr. Janese Banks wants her to see cardiology she will

## 2020-10-04 NOTE — Research (Signed)
WF [address] Upbeat Protocol Note:   3 month Upbeat Protocol research lab results were received today from [facility]. Lipid panel, glucose, Creatinine, C-Reactive Protein and HCT were reviewed by Dr. Janese Banks with the baseline results for comparison. Dr. Janese Banks stated they looked about the same, ok to send results to the patient.  Jeral Fruit  10/04/20  2:50 PM

## 2020-10-04 NOTE — Progress Notes (Signed)
Hematology/Oncology Consult note [facility]  Telephone:([phone] [phone]  Patient Care Team: Idelle Crouch, MD as PCP - General (Internal Medicine) Theodore Demark, RN as Oncology Nurse Navigator Noreene Filbert, MD as Radiation Oncologist (Radiation Oncology) Jeral Fruit, RN as Registered Nurse   Name of the patient: Marissa Phillips  [id number]  1961-07-30   Date of visit: 10/04/20  Diagnosis- bilateral breast cancer ER/PR positive and HER-2/neu negative s/p bilateral lumpectomy  Chief complaint/ Reason for visit-routine follow-up of breast cancer  Heme/Onc history: Patient is a 59 year old female who recently underwent a bilateral screening mammogram on 07/13/2020 which showed possible mass in both the right and left breast. This was followed by a diagnostic mammogram and ultrasound. Right breast demonstrated 0.6 x 0.5 x 0.5 cm hypoechoic mass in the left breast demonstrated 0.5 x 0.5 x 0.4 cm hypoechoic mass. There was an abnormal right axillary lymph node noted with cortical thickening. All these areas were biopsied. Right axillary lymph node was negative for malignancy. Right breast mass showed invasive mammary carcinoma 6 mm grade 3. Left breast mass showed invasive mammary carcinoma with lobular features 3 mm grade 2. ER/PR and HER-2 status was pending at the time of my visit  Patient has a prior history of ER positive breast cancer back in 2005. She is s/p surgery and radiation and hormone therapy at that time for 5 years  Bilateral breast MRI showed biopsy-proven malignancy in bilateral breasts indeterminate non-mass enhancement adjacent to prior right breast lumpectomy and enhancing focus in the central left breast.Biopsy of both the sites revealed fat necrosis of the right breast and fibrocystic changes in the left  Patient went for bilateral lumpectomy.  Right breast pathology showed 0.6 cm invasive ductal carcinoma  grade 2 with negative margins and left breast showed 1.2 cm invasive lobular carcinoma.  1 sentinel lymph node on the left side was negative for malignancy.  Both these tumors were ER positive and HER-2 negative.  PR negative.  Oncotype testing was sent on both right and left breast specimens and both came back with an Oncotype score of 24 which puts her at intermediate risk for age.  She would not benefit from adjuvant chemotherapy.  She completed adjuvant radiation therapy and started Aromasin in September 2021   Interval history-patient reports tolerating Aromasin well without any significant side effects.  Reports feeling more tired than usual and some disturbances in her sleep. ECOG PS- 1 Pain scale- 0   Review of systems- Review of Systems  Constitutional: Positive for malaise/fatigue. Negative for chills, fever and weight loss.  HENT: Negative for congestion, ear discharge and nosebleeds.   Eyes: Negative for blurred vision.  Respiratory: Negative for cough, hemoptysis, sputum production, shortness of breath and wheezing.   Cardiovascular: Negative for chest pain, palpitations, orthopnea and claudication.  Gastrointestinal: Negative for abdominal pain, blood in stool, constipation, diarrhea, heartburn, melena, nausea and vomiting.  Genitourinary: Negative for dysuria, flank pain, frequency, hematuria and urgency.  Musculoskeletal: Negative for back pain, joint pain and myalgias.  Skin: Negative for rash.  Neurological: Negative for dizziness, tingling, focal weakness, seizures, weakness and headaches.  Endo/Heme/Allergies: Does not bruise/bleed easily.  Psychiatric/Behavioral: Negative for depression and suicidal ideas. The patient does not have insomnia.       Allergies  Allergen Reactions  . Celebrex [Celecoxib] Rash  . Clindamycin Hcl Rash  . Loratadine Other (See Comments)    fevers  . Omeprazole Other (See Comments)  After 1 week of therapy dry mouth and mild  nighttime urinary frequency -bothersome enough for her stop it.   Marland Kitchen Penicillins Rash    Slight redness and itching   . Sulfa Antibiotics Rash     Past Medical History:  Diagnosis Date  . Asthma   . Breast cancer ([employer]) 2005  . Cancer [facility])    Breast- Right-2005  . Osteopenia   . Personal history of chemotherapy   . Personal history of radiation therapy   . PONV (postoperative nausea and vomiting)      Past Surgical History:  Procedure Laterality Date  . BREAST BIOPSY Right 2005   +  . BREAST BIOPSY Right 03/01/2020   [place] bx of mass at 2:00 heart marker, path pending  . BREAST BIOPSY Left 03/01/2020   [place] bx mass at 3:00, coil marker, path pending  . BREAST BIOPSY Right 03/01/2020   [place] bx of right axilla, no marker placed, path pending  . BREAST LUMPECTOMY Right 2005  . BREAST LUMPECTOMY WITH NEEDLE LOCALIZATION AND AXILLARY SENTINEL LYMPH NODE BX Right 2005  . BREAST LUMPECTOMY WITH RADIOACTIVE SEED AND SENTINEL LYMPH NODE BIOPSY Bilateral 04/14/2020   Procedure: BILATERAL RADIOACTIVE SEED GUIDED BREAST LUMPECTOMIES, BILATERAL AXILLARY SENTINEL LYMPH NODE BIOPSIES, WITH BLUE DYE INJECTION RIGHT BREAST;  Surgeon: Alphonsa Overall, MD;  Location: [place];  Service: General;  Laterality: Bilateral;  . BREAST SURGERY    . COLONOSCOPY WITH PROPOFOL N/A 04/02/2017   Procedure: COLONOSCOPY WITH PROPOFOL;  Surgeon: Jonathon Bellows, MD;  Location: [facility] ENDOSCOPY;  Service: Endoscopy;  Laterality: N/A;  . ESOPHAGOGASTRODUODENOSCOPY (EGD) WITH PROPOFOL N/A 04/02/2017   Procedure: ESOPHAGOGASTRODUODENOSCOPY (EGD) WITH PROPOFOL;  Surgeon: Jonathon Bellows, MD;  Location: [facility] ENDOSCOPY;  Service: Endoscopy;  Laterality: N/A;  . TONSILLECTOMY  ~2000    Social History   Socioeconomic History  . Marital status: Married    Spouse name: Not on file  . Number of children: Not on file  . Years of education: Not on file  . Highest education level: Not on file  Occupational History  . Not on file  Tobacco  Use  . Smoking status: Never Smoker  . Smokeless tobacco: Never Used  Vaping Use  . Vaping Use: Never used  Substance and Sexual Activity  . Alcohol use: No  . Drug use: No  . Sexual activity: Not on file  Other Topics Concern  . Not on file  Social History Narrative  . Not on file   Social Determinants of Health   Financial Resource Strain:   . Difficulty of Paying Living Expenses: Not on file  Food Insecurity:   . Worried About Charity fundraiser in the Last Year: Not on file  . Ran Out of Food in the Last Year: Not on file  Transportation Needs:   . Lack of Transportation (Medical): Not on file  . Lack of Transportation (Non-Medical): Not on file  Physical Activity:   . Days of Exercise per Week: Not on file  . Minutes of Exercise per Session: Not on file  Stress:   . Feeling of Stress : Not on file  Social Connections:   . Frequency of Communication with Friends and Family: Not on file  . Frequency of Social Gatherings with Friends and Family: Not on file  . Attends Religious Services: Not on file  . Active Member of Clubs or Organizations: Not on file  . Attends Archivist Meetings: Not on file  . Marital Status: Not  on file  Intimate Partner Violence:   . Fear of Current or Ex-Partner: Not on file  . Emotionally Abused: Not on file  . Physically Abused: Not on file  . Sexually Abused: Not on file    Family History  Problem Relation Age of Onset  . Cancer Mother 12       Breast, now bilateral  . Breast cancer Mother 33       right breast ca then left breast ca   . Leukemia Mother   . Heart attack Father   . Hepatitis Father      Current Outpatient Medications:  .  acetaminophen (TYLENOL) 500 MG tablet, Take 500-1,000 mg by mouth 2 (two) times daily as needed (pain.)., Disp: , Rfl:  .  cetirizine (ZYRTEC ALLERGY) 10 MG tablet, Take 10 mg by mouth daily. , Disp: , Rfl:  .  exemestane (AROMASIN) 25 MG tablet, Take 1 tablet (25 mg total) by mouth  daily after breakfast., Disp: 30 tablet, Rfl: 3 .  famotidine (PEPCID) 20 MG tablet, Take 20 mg by mouth daily as needed for heartburn or indigestion., Disp: , Rfl:  .  L-Theanine 200 MG CAPS, Take 200-600 mg by mouth at bedtime as needed (sleep). , Disp: , Rfl:  .  naproxen sodium (ALEVE) 220 MG tablet, Take 220-440 mg by mouth daily as needed (pain.). , Disp: , Rfl:   Physical exam:  Vitals:   09/29/20 1101  BP: 127/79  Pulse: 86  Resp: 16  Temp: 97.8 F (36.6 C)  TempSrc: Oral  Weight: 206 lb 4.8 oz (93.6 kg)  Height: 5' 6"  (1.676 m)   Physical Exam Cardiovascular:     Rate and Rhythm: Normal rate and regular rhythm.     Heart sounds: Normal heart sounds.  Pulmonary:     Effort: Pulmonary effort is normal.     Breath sounds: Normal breath sounds.  Abdominal:     General: Bowel sounds are normal.     Palpations: Abdomen is soft.  Skin:    General: Skin is warm and dry.  Neurological:     Mental Status: She is alert and oriented to person, place, and time.      CMP Latest Ref Rng & Units 09/29/2020  Glucose 70 - 99 mg/dL 92  BUN 6 - 20 mg/dL 14  Creatinine 0.44 - 1.00 mg/dL 0.75  Sodium 135 - 145 mmol/L 139  Potassium 3.5 - 5.1 mmol/L 3.7  Chloride 98 - 111 mmol/L 104  CO2 22 - 32 mmol/L 29  Calcium 8.9 - 10.3 mg/dL 9.4  Total Protein 6.5 - 8.1 g/dL 7.5  Total Bilirubin 0.3 - 1.2 mg/dL 0.5  Alkaline Phos 38 - 126 U/L 77  AST 15 - 41 U/L 23  ALT 0 - 44 U/L 19   CBC Latest Ref Rng & Units 04/11/2020  WBC 4.0 - 10.5 K/uL 4.5  Hemoglobin 12.0 - 15.0 g/dL 14.3  Hematocrit 36 - 46 % 45.3  Platelets 150 - 400 K/uL 250    Assessment and plan- Patient is a 59 y.o. female  with prior history of right breast cancer now with bilateral breast cancer stage I ER positive and HER-2 negative s/p bilateral lumpectomy and left sentinel lymph node biopsy.  She did not require adjuvant chemotherapy based on her Oncotype score.  She completed adjuvant radiation therapy and is  currently on Aromasin and this is a routine follow-up visit  Patient is currently tolerating Aromasin well but does report some  subjective increase in her fatigue as well as problems with insomnia.  She would like to see how the next couple of months ago before deciding to switch any treatments.  She has taken Arimidex in the past which she could not tolerate.  We agreed that she would continue on Aromasin along with calcium and vitamin D at this time.  I will see her back in 3 months  Patient is also participating in upbeat study and undergoes cardiac MRI as a part of the protocol.  She had one done in August 2021 I only saw the results of that today as they are not Available for my review on the computer.  MRI back then did show an EF of 43.7% indicating a mild to moderate reduction in the EF.  She is undergoing another MRI on October 10, 2020 and we will reach out to get those results within a few days time.  Based on the results of the repeat MRI I will refer her to cardiology    Visit Diagnosis 1. Use of exemestane (Aromasin)      Dr. Randa Evens, MD, MPH [facility] [phone] 10/04/2020 12:57 PM

## 2020-10-10 ENCOUNTER — Ambulatory Visit (HOSPITAL_COMMUNITY)
Admission: RE | Admit: 2020-10-10 | Discharge: 2020-10-10 | Disposition: A | Discharge status: Home / Self Care | Payer: Self-pay | Source: Ambulatory Visit | Attending: Oncology | Admitting: Oncology

## 2020-10-10 ENCOUNTER — Other Ambulatory Visit: Payer: Self-pay

## 2020-10-10 DIAGNOSIS — C50919 Malignant neoplasm of unspecified site of unspecified female breast: Secondary | ICD-10-CM | POA: Insufficient documentation

## 2020-11-03 DIAGNOSIS — N9089 Other specified noninflammatory disorders of vulva and perineum: Secondary | ICD-10-CM | POA: Diagnosis not present

## 2020-11-04 ENCOUNTER — Telehealth: Payer: Self-pay | Admitting: *Deleted

## 2020-11-04 NOTE — Telephone Encounter (Signed)
11/04/20 4:28 PM This RN spoke with patient Marissa Phillips to inform her that cardiac MRI charge had been redirected to the trial, and apologized for the inconvenience.  Patient expressed appreciation for the update. Doreatha Martin, RN, BSN, [facility] 11/04/2020 4:29 PM

## 2020-11-10 DIAGNOSIS — C50911 Malignant neoplasm of unspecified site of right female breast: Secondary | ICD-10-CM | POA: Diagnosis not present

## 2020-11-10 DIAGNOSIS — Z853 Personal history of malignant neoplasm of breast: Secondary | ICD-10-CM | POA: Diagnosis not present

## 2020-11-10 DIAGNOSIS — C50912 Malignant neoplasm of unspecified site of left female breast: Secondary | ICD-10-CM | POA: Diagnosis not present

## 2020-11-14 ENCOUNTER — Other Ambulatory Visit: Payer: Self-pay | Admitting: Oncology

## 2020-11-14 DIAGNOSIS — H5213 Myopia, bilateral: Secondary | ICD-10-CM | POA: Diagnosis not present

## 2020-12-02 ENCOUNTER — Ambulatory Visit: Admit: 2020-12-02 | Discharge status: Against Medical Advice | Payer: 59

## 2020-12-02 ENCOUNTER — Other Ambulatory Visit: Payer: Self-pay | Admitting: Internal Medicine

## 2020-12-02 ENCOUNTER — Telehealth: Payer: 59 | Admitting: Family

## 2020-12-02 DIAGNOSIS — B9689 Other specified bacterial agents as the cause of diseases classified elsewhere: Secondary | ICD-10-CM | POA: Diagnosis not present

## 2020-12-02 DIAGNOSIS — J019 Acute sinusitis, unspecified: Secondary | ICD-10-CM | POA: Diagnosis not present

## 2020-12-02 DIAGNOSIS — R300 Dysuria: Secondary | ICD-10-CM

## 2020-12-02 DIAGNOSIS — R399 Unspecified symptoms and signs involving the genitourinary system: Secondary | ICD-10-CM | POA: Diagnosis not present

## 2020-12-02 DIAGNOSIS — Z03818 Encounter for observation for suspected exposure to other biological agents ruled out: Secondary | ICD-10-CM | POA: Diagnosis not present

## 2020-12-02 NOTE — Progress Notes (Signed)
Based on what you shared with me, I feel your condition warrants further evaluation and I recommend that you be seen for a face to face visit.  Please contact your primary care physician practice to be seen. Many offices offer virtual options to be seen via video if you are not comfortable going in person to a medical facility at this time.  If you do not have a PCP, [place] offers a free physician referral service available at [phone]. Our trained staff has the experience, knowledge and resources to put you in touch with a physician who is right for you.   You also have the option of a video visit through [email or web address]  If you are having a true medical emergency please call 911.  NOTE: If you entered your credit card information for this eVisit, you will not be charged. You may see a "hold" on your card for the $35 but that hold will drop off and you will not have a charge processed.  Your e-visit answers were reviewed by a board certified advanced clinical practitioner to complete your personal care plan.  Thank you for using e-Visits.  

## 2020-12-30 ENCOUNTER — Other Ambulatory Visit: Payer: Self-pay

## 2020-12-30 ENCOUNTER — Inpatient Hospital Stay: Payer: 59 | Attending: Oncology | Admitting: Oncology

## 2020-12-30 ENCOUNTER — Encounter: Payer: Self-pay | Admitting: Oncology

## 2020-12-30 ENCOUNTER — Encounter: Payer: Self-pay | Admitting: *Deleted

## 2020-12-30 VITALS — BP 123/83 | HR 85 | Temp 97.8°F | Resp 20 | Wt 203.0 lb

## 2020-12-30 DIAGNOSIS — C50911 Malignant neoplasm of unspecified site of right female breast: Secondary | ICD-10-CM | POA: Diagnosis not present

## 2020-12-30 DIAGNOSIS — C50912 Malignant neoplasm of unspecified site of left female breast: Secondary | ICD-10-CM | POA: Diagnosis not present

## 2020-12-30 DIAGNOSIS — Z853 Personal history of malignant neoplasm of breast: Secondary | ICD-10-CM

## 2020-12-30 DIAGNOSIS — Z170 Estrogen receptor positive status [ER+]: Secondary | ICD-10-CM | POA: Insufficient documentation

## 2020-12-30 DIAGNOSIS — Z79811 Long term (current) use of aromatase inhibitors: Secondary | ICD-10-CM | POA: Insufficient documentation

## 2020-12-30 DIAGNOSIS — Z923 Personal history of irradiation: Secondary | ICD-10-CM | POA: Diagnosis not present

## 2020-12-30 NOTE — Progress Notes (Signed)
Hematology/Oncology Consult note [facility]  [phone] [phone]  Patient Care Team: Idelle Crouch, MD as PCP - General (Internal Medicine) Theodore Demark, RN as Oncology Nurse Navigator Noreene Filbert, MD as Radiation Oncologist (Radiation Oncology) Jeral Fruit, RN as Registered Nurse   Name of the patient: Marissa Phillips  [id number]  [birth year]   Date of visit: 12/30/20  Diagnosis- bilateral breast cancer ER/PR positive and HER-2/neu negative s/p bilateral lumpectomy  Chief complaint/ Reason for visit-routine follow-up of breast cancer  Heme/Onc history: Patient is a 60 year old female who recently underwent a bilateral screening mammogram on 07/13/2020 which showed possible mass in both the right and left breast. This was followed by a diagnostic mammogram and ultrasound. Right breast demonstrated 0.6 x 0.5 x 0.5 cm hypoechoic mass in the left breast demonstrated 0.5 x 0.5 x 0.4 cm hypoechoic mass. There was an abnormal right axillary lymph node noted with cortical thickening. All these areas were biopsied. Right axillary lymph node was negative for malignancy. Right breast mass showed invasive mammary carcinoma 6 mm grade 3. Left breast mass showed invasive mammary carcinoma with lobular features 3 mm grade 2. ER/PR and HER-2 status was pending at the time of my visit  Patient has a prior history of ER positive breast cancer back in 2005. She is s/p surgery and radiation and hormone therapy at that time for 5 years  Bilateral breast MRI showed biopsy-proven malignancy in bilateral breasts indeterminate non-mass enhancement adjacent to prior right breast lumpectomy and enhancing focus in the central left breast.Biopsy of both the sites revealed fat necrosis of the right breast and fibrocystic changes in the left  Patient went for bilateral lumpectomy. Right breast pathology showed 0.6 cm invasive ductal carcinoma  grade 2 with negative margins and left breast showed 1.2 cm invasive lobular carcinoma. 1 sentinel lymph node on the left side was negative for malignancy. Both these tumors were ER positive and HER-2 negative. PR negative.  Oncotype testing was sent on both right and left breast specimens and both came back with an Oncotype score of 24 which puts her at intermediate risk for age. She would not benefit from adjuvant chemotherapy.  She completed adjuvant radiation therapy and started Aromasin in September 2021    Interval history-patient reports tolerating Aromasin well.  Her fatigue and joint pains have gotten better.  Denies any new complaints at this time  ECOG PS- 1 Pain scale- 0   Review of systems- Review of Systems  Constitutional: Negative for chills, fever, malaise/fatigue and weight loss.  HENT: Negative for congestion, ear discharge and nosebleeds.   Eyes: Negative for blurred vision.  Respiratory: Negative for cough, hemoptysis, sputum production, shortness of breath and wheezing.   Cardiovascular: Negative for chest pain, palpitations, orthopnea and claudication.  Gastrointestinal: Negative for abdominal pain, blood in stool, constipation, diarrhea, heartburn, melena, nausea and vomiting.  Genitourinary: Negative for dysuria, flank pain, frequency, hematuria and urgency.  Musculoskeletal: Negative for back pain, joint pain and myalgias.  Skin: Negative for rash.  Neurological: Negative for dizziness, tingling, focal weakness, seizures, weakness and headaches.  Endo/Heme/Allergies: Does not bruise/bleed easily.  Psychiatric/Behavioral: Negative for depression and suicidal ideas. The patient does not have insomnia.      Allergies  Allergen Reactions  . Celebrex [Celecoxib] Rash  . Clindamycin Hcl Rash  . Loratadine Other (See Comments)    fevers  . Omeprazole Other (See Comments)    After 1 week of therapy dry mouth  and mild nighttime urinary frequency -bothersome  enough for her stop it.   Marland Kitchen Penicillins Rash    Slight redness and itching   . Sulfa Antibiotics Rash     Past Medical History:  Diagnosis Date  . Asthma   . Breast cancer ([place]) 2005  . Cancer [facility])    Breast- Right-2005  . Osteopenia   . Personal history of chemotherapy   . Personal history of radiation therapy   . PONV (postoperative nausea and vomiting)      Past Surgical History:  Procedure Laterality Date  . BREAST BIOPSY Right 2005   +  . BREAST BIOPSY Right 03/01/2020   [place] bx of mass at 2:00 heart marker, path pending  . BREAST BIOPSY Left 03/01/2020   [place] bx mass at 3:00, coil marker, path pending  . BREAST BIOPSY Right 03/01/2020   [place] bx of right axilla, no marker placed, path pending  . BREAST LUMPECTOMY Right 2005  . BREAST LUMPECTOMY WITH NEEDLE LOCALIZATION AND AXILLARY SENTINEL LYMPH NODE BX Right 2005  . BREAST LUMPECTOMY WITH RADIOACTIVE SEED AND SENTINEL LYMPH NODE BIOPSY Bilateral 04/14/2020   Procedure: BILATERAL RADIOACTIVE SEED GUIDED BREAST LUMPECTOMIES, BILATERAL AXILLARY SENTINEL LYMPH NODE BIOPSIES, WITH BLUE DYE INJECTION RIGHT BREAST;  Surgeon: Alphonsa Overall, MD;  Location: [place];  Service: General;  Laterality: Bilateral;  . BREAST SURGERY    . COLONOSCOPY WITH PROPOFOL N/A 04/02/2017   Procedure: COLONOSCOPY WITH PROPOFOL;  Surgeon: Jonathon Bellows, MD;  Location[facility] END[facility]COPY;  Service: Endoscopy;  Laterality: N/A;  . ESOPHAGOGASTRODUODENOSCOPY (EGD) WITH PROPOFOL N/A 04/02/2017   Procedure: ESOPHAGOGASTRODUODENOSCOPY (EGD) WITH PROPOFOL;  Surgeon: Jonathon Bellows, MD;  Location: [facility] ENDOSCOPY;  Service: Endoscopy;  Laterality: N/A;  . TONSILLECTOMY  ~2000    Social History   Socioeconomic History  . Marital status: Married    Spouse name: Not on file  . Number of children: Not on file  . Years of education: Not on file  . Highest education level: Not on file  Occupational History  . Not on file  Tobacco Use  . Smoking status: Never Smoker  .  Smokeless tobacco: Never Used  Vaping Use  . Vaping Use: Never used  Substance and Sexual Activity  . Alcohol use: No  . Drug use: No  . Sexual activity: Not on file  Other Topics Concern  . Not on file  Social History Narrative  . Not on file   Social Determinants of Health   Financial Resource Strain: Not on file  Food Insecurity: Not on file  Transportation Needs: Not on file  Physical Activity: Not on file  Stress: Not on file  Social Connections: Not on file  Intimate Partner Violence: Not on file    Family History  Problem Relation Age of Onset  . Cancer Mother 79       Breast, now bilateral  . Breast cancer Mother 29       right breast ca then left breast ca   . Leukemia Mother   . Heart attack Father   . Hepatitis Father      Current Outpatient Medications:  .  calcium-vitamin D (OSCAL WITH D) 500-200 MG-UNIT tablet, Take 1 tablet by mouth., Disp: , Rfl:  .  exemestane (AROMASIN) 25 MG tablet, TAKE 1 TABLET BY MOUTH DAILY AFTER BREAKFAST., Disp: 30 tablet, Rfl: 3 .  famotidine (PEPCID) 20 MG tablet, Take 20 mg by mouth daily as needed for heartburn or indigestion., Disp: , Rfl:  .  L-Theanine 200 MG CAPS, Take 200-600 mg by mouth at bedtime as needed (sleep). , Disp: , Rfl:  .  acetaminophen (TYLENOL) 500 MG tablet, Take 500-1,000 mg by mouth 2 (two) times daily as needed (pain.). (Patient not taking: Reported on 12/30/2020), Disp: , Rfl:  .  cetirizine (ZYRTEC) 10 MG tablet, Take 10 mg by mouth daily.  (Patient not taking: Reported on 12/30/2020), Disp: , Rfl:  .  naproxen sodium (ALEVE) 220 MG tablet, Take 220-440 mg by mouth daily as needed (pain.).  (Patient not taking: Reported on 12/30/2020), Disp: , Rfl:   Physical exam:  Vitals:   12/30/20 1019  BP: 123/83  Pulse: 85  Resp: 20  Temp: 97.8 F (36.6 C)  TempSrc: Tympanic  SpO2: 97%  Weight: 203 lb (92.1 kg)   Physical Exam Constitutional:      General: She is not in acute distress. Eyes:      Extraocular Movements: EOM normal.  Cardiovascular:     Rate and Rhythm: Normal rate and regular rhythm.     Heart sounds: Normal heart sounds.  Pulmonary:     Effort: Pulmonary effort is normal.     Breath sounds: Normal breath sounds.  Skin:    General: Skin is warm and dry.  Neurological:     Mental Status: She is alert and oriented to person, place, and time.      CMP Latest Ref Rng & Units 09/29/2020  Glucose 70 - 99 mg/dL 92  BUN 6 - 20 mg/dL 14  Creatinine 0.44 - 1.00 mg/dL 0.75  Sodium 135 - 145 mmol/L 139  Potassium 3.5 - 5.1 mmol/L 3.7  Chloride 98 - 111 mmol/L 104  CO2 22 - 32 mmol/L 29  Calcium 8.9 - 10.3 mg/dL 9.4  Total Protein 6.5 - 8.1 g/dL 7.5  Total Bilirubin 0.3 - 1.2 mg/dL 0.5  Alkaline Phos 38 - 126 U/L 77  AST 15 - 41 U/L 23  ALT 0 - 44 U/L 19   CBC Latest Ref Rng & Units 04/11/2020  WBC 4.0 - 10.5 K/uL 4.5  Hemoglobin 12.0 - 15.0 g/dL 14.3  Hematocrit 36.0 - 46.0 % 45.3  Platelets 150 - 400 K/uL 250      Assessment and plan- Patient is a 60 y.o. female prior history of right breast cancer now with bilateral breast cancer stage I ER positive and HER-2 negative s/p bilateral lumpectomy and left sentinel lymph node biopsy.  She did not require adjuvant chemotherapy based on her Oncotype score.  She completed adjuvant radiation therapy and is currently on Aromasin.  This is a routine follow-up visit  Patient is tolerating Aromasin well without significant side effects.  Fatigue and joint pains are mild and self-limited.  She had a recent breast exam by Dr. Lucia Gaskins in December 2021 and I will therefore defer breast exam today.  I will order her diagnostic mammogram which needs to be done in April 2022.  Patient would like to defer her Covid booster shot until she gets her mammogram  I will see her back in 4 months    Visit Diagnosis 1. Hx of breast cancer   2. Bilateral malignant neoplasm of breast in female, unspecified estrogen receptor status,  unspecified site of breast ([place])   3. Use of exemestane (Aromasin)      Dr. Randa Evens, MD, MPH [facility] at [facility] [place] [phone] 12/30/2020 11:29 AM

## 2021-01-11 ENCOUNTER — Other Ambulatory Visit: Payer: Self-pay | Admitting: Internal Medicine

## 2021-01-11 DIAGNOSIS — Z0000 Encounter for general adult medical examination without abnormal findings: Secondary | ICD-10-CM | POA: Diagnosis not present

## 2021-01-11 DIAGNOSIS — M810 Age-related osteoporosis without current pathological fracture: Secondary | ICD-10-CM | POA: Diagnosis not present

## 2021-01-11 DIAGNOSIS — E785 Hyperlipidemia, unspecified: Secondary | ICD-10-CM | POA: Diagnosis not present

## 2021-01-11 DIAGNOSIS — C50811 Malignant neoplasm of overlapping sites of right female breast: Secondary | ICD-10-CM | POA: Diagnosis not present

## 2021-01-19 DIAGNOSIS — M810 Age-related osteoporosis without current pathological fracture: Secondary | ICD-10-CM | POA: Diagnosis not present

## 2021-01-19 DIAGNOSIS — E785 Hyperlipidemia, unspecified: Secondary | ICD-10-CM | POA: Diagnosis not present

## 2021-01-19 DIAGNOSIS — Z79899 Other long term (current) drug therapy: Secondary | ICD-10-CM | POA: Diagnosis not present

## 2021-01-20 ENCOUNTER — Encounter: Payer: Self-pay | Admitting: Oncology

## 2021-01-23 ENCOUNTER — Other Ambulatory Visit: Payer: Self-pay

## 2021-01-23 ENCOUNTER — Encounter: Payer: Self-pay | Admitting: Radiation Oncology

## 2021-01-23 ENCOUNTER — Ambulatory Visit
Admission: RE | Admit: 2021-01-23 | Discharge: 2021-01-23 | Disposition: A | Discharge status: Home / Self Care | Payer: 59 | Source: Ambulatory Visit | Attending: Radiation Oncology | Admitting: Radiation Oncology

## 2021-01-23 VITALS — BP 124/82 | HR 84 | Temp 97.7°F | Wt 203.0 lb

## 2021-01-23 DIAGNOSIS — Z170 Estrogen receptor positive status [ER+]: Secondary | ICD-10-CM | POA: Diagnosis not present

## 2021-01-23 DIAGNOSIS — C50911 Malignant neoplasm of unspecified site of right female breast: Secondary | ICD-10-CM | POA: Diagnosis not present

## 2021-01-23 DIAGNOSIS — C50912 Malignant neoplasm of unspecified site of left female breast: Secondary | ICD-10-CM | POA: Diagnosis not present

## 2021-01-23 DIAGNOSIS — Z08 Encounter for follow-up examination after completed treatment for malignant neoplasm: Secondary | ICD-10-CM | POA: Diagnosis not present

## 2021-01-23 NOTE — Progress Notes (Signed)
Radiation Oncology Follow up Note  Name: Marissa Phillips   Date:   01/23/2021 MRN:  [id number] DOB: 1961-11-15    This 60 y.o. female presents to the clinic today for 46-month follow-up status post bilateral breast radiation patient previously treated in 2006 to her right breast with whole breast radiation at this time received partial breast radiation to her right breast.  REFERRING PROVIDER: Idelle Crouch, MD  HPI: Patient is a 60 year old female now status post bilateral breast radiation partial breast to her right breast whole breast radiation to her left breast.  Seen today in routine follow-up she is doing well.  She states she has been having some pain in her left breast shooting type most likely compatible with neurogenic growth after surgery.  She otherwise specifically denies cough or bone pain..  She is currently on Aromasin tolerant well without side effect.  She is scheduled for bilateral mammograms next month.  COMPLICATIONS OF TREATMENT: none  FOLLOW UP COMPLIANCE: keeps appointments   PHYSICAL EXAM:  BP 124/82 (BP Location: Right Arm, Cuff Size: Large)   Pulse 84   Temp 97.7 F (36.5 C) (Tympanic)   Wt 203 lb (92.1 kg)   LMP  (LMP Unknown)   BMI 32.77 kg/m  Lungs are clear to A&P cardiac examination essentially unremarkable with regular rate and rhythm. No dominant mass or nodularity is noted in either breast in 2 positions examined. Incision is well-healed. No axillary or supraclavicular adenopathy is appreciated. Cosmetic result is excellent.  Well-developed well-nourished patient in NAD. HEENT reveals PERLA, EOMI, discs not visualized.  Oral cavity is clear. No oral mucosal lesions are identified. Neck is clear without evidence of cervical or supraclavicular adenopathy. Lungs are clear to A&P. Cardiac examination is essentially unremarkable with regular rate and rhythm without murmur rub or thrill. Abdomen is benign with no organomegaly or masses noted. Motor sensory and  DTR levels are equal and symmetric in the upper and lower extremities. Cranial nerves II through XII are grossly intact. Proprioception is intact. No peripheral adenopathy or edema is identified. No motor or sensory levels are noted. Crude visual fields are within normal range.  RADIOLOGY RESULTS: No current films to review  PLAN: Present time patient is recovered well from bilateral breast radiation.  No significant side effects or complaints.  She has no evidence of disease.  She has mammograms next month which I will review when the become available.  She continues on Aromasin without side effect.  I have asked to see her back in 6 months for follow-up and then will go to once your follow-up visits.  Patient knows to call with any concerns.  I would like to take this opportunity to thank you for allowing me to participate in the care of your patient.Noreene Filbert, MD

## 2021-02-13 ENCOUNTER — Ambulatory Visit
Admission: RE | Admit: 2021-02-13 | Discharge: 2021-02-13 | Disposition: A | Discharge status: Home / Self Care | Payer: 59 | Source: Ambulatory Visit | Attending: Oncology | Admitting: Oncology

## 2021-02-13 ENCOUNTER — Other Ambulatory Visit: Payer: Self-pay

## 2021-02-13 DIAGNOSIS — C50912 Malignant neoplasm of unspecified site of left female breast: Secondary | ICD-10-CM

## 2021-02-13 DIAGNOSIS — Z853 Personal history of malignant neoplasm of breast: Secondary | ICD-10-CM | POA: Diagnosis not present

## 2021-02-13 DIAGNOSIS — C50911 Malignant neoplasm of unspecified site of right female breast: Secondary | ICD-10-CM

## 2021-02-13 DIAGNOSIS — R922 Inconclusive mammogram: Secondary | ICD-10-CM | POA: Diagnosis not present

## 2021-02-13 IMAGING — MG DIGITAL DIAGNOSTIC BILAT W/ TOMO W/ CAD
6 of 10 series · 6 of 26 positions shown · non-contrast
Comparison: Previous exam(s).

CLINICAL DATA: History of bilateral lumpectomies. History right
breast cancer in [Y4] and [Y4]. History of left breast cancer in
[Y4].

EXAM:
DIGITAL DIAGNOSTIC BILATERAL MAMMOGRAM WITH TOMOSYNTHESIS AND CAD
TECHNIQUE: Bilateral digital diagnostic mammography and breast tomosynthesis
was performed. The images were evaluated with computer-aided
detection.

[R MLO]
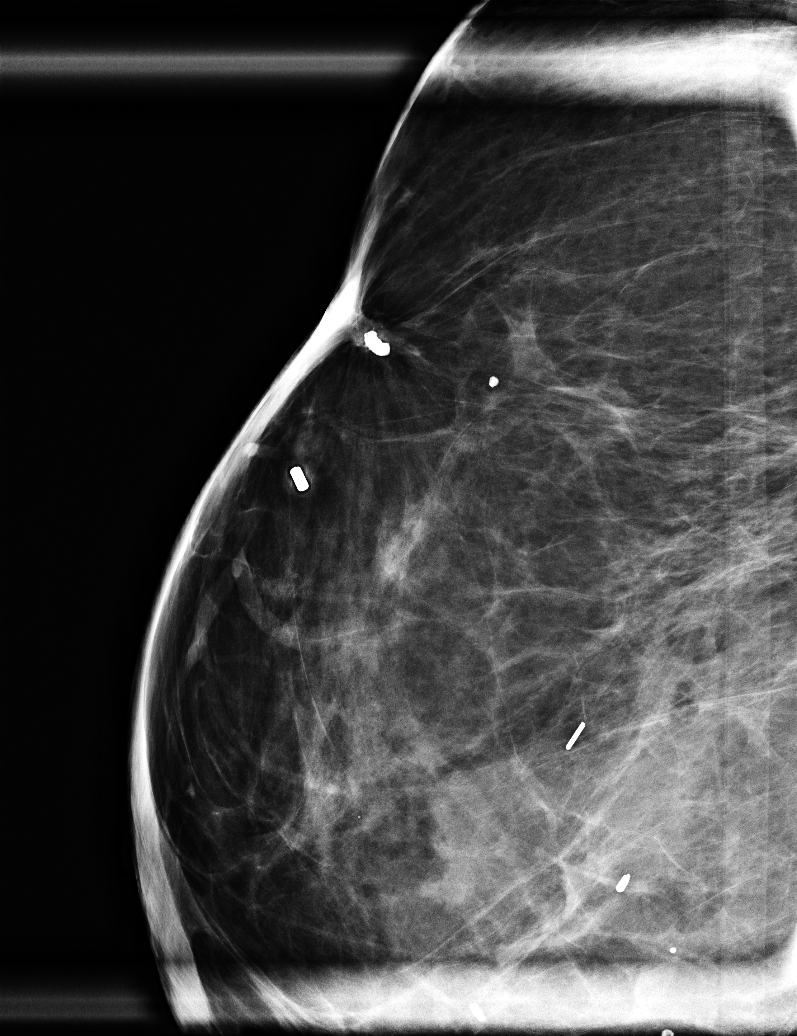

[L MLO]
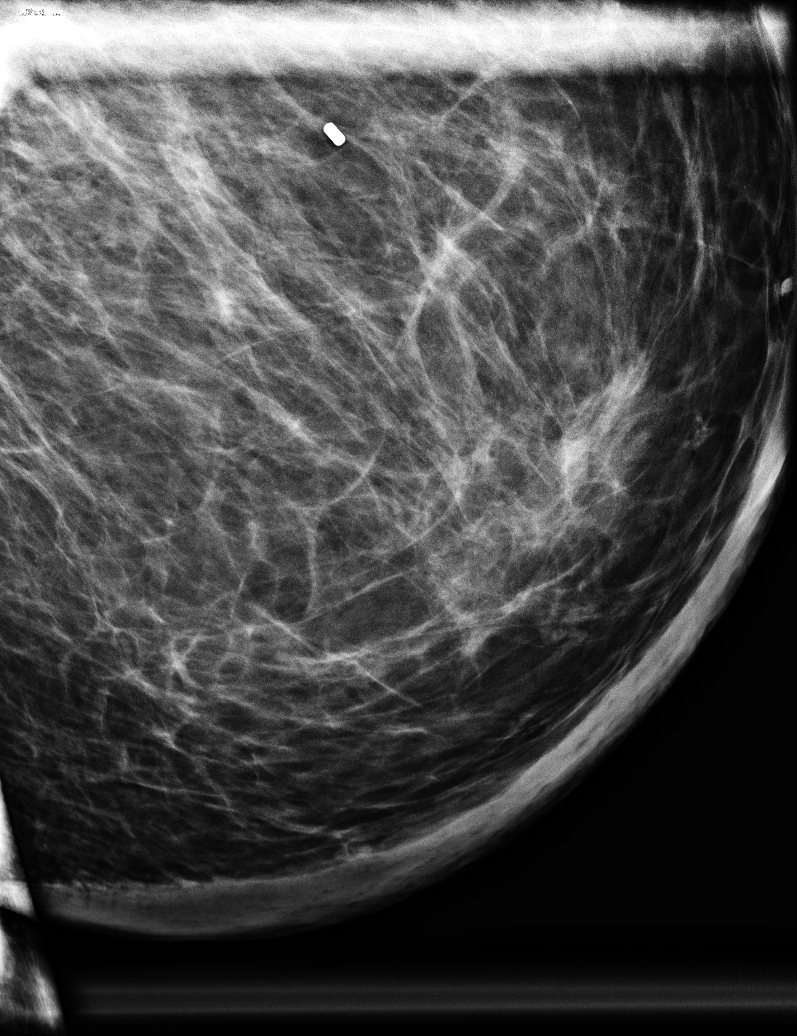

[R CC synth-2D]
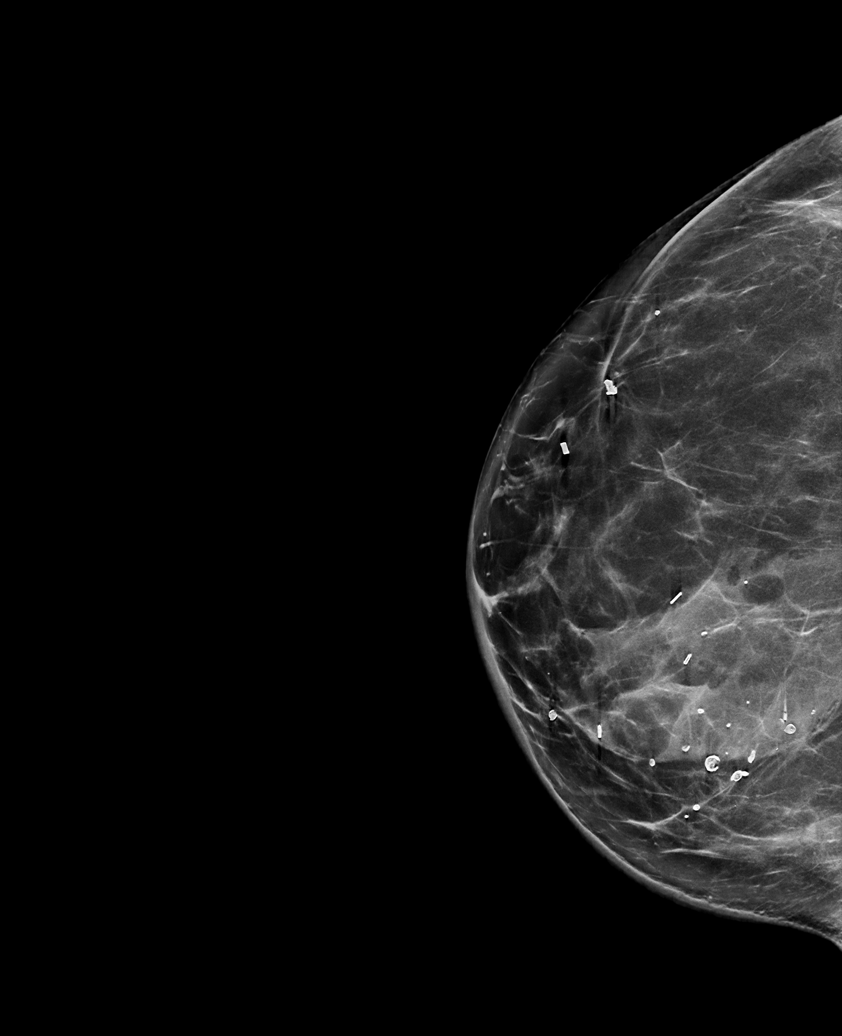

[L MLO synth-2D]
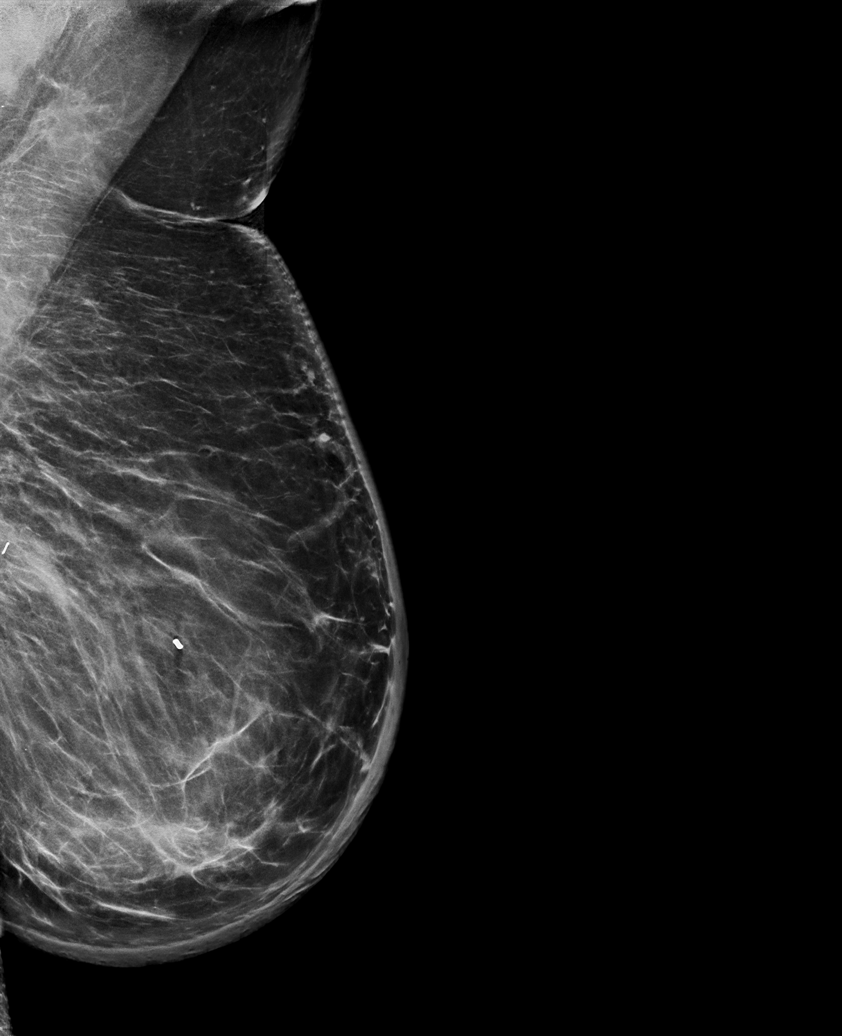

[L CC synth-2D]
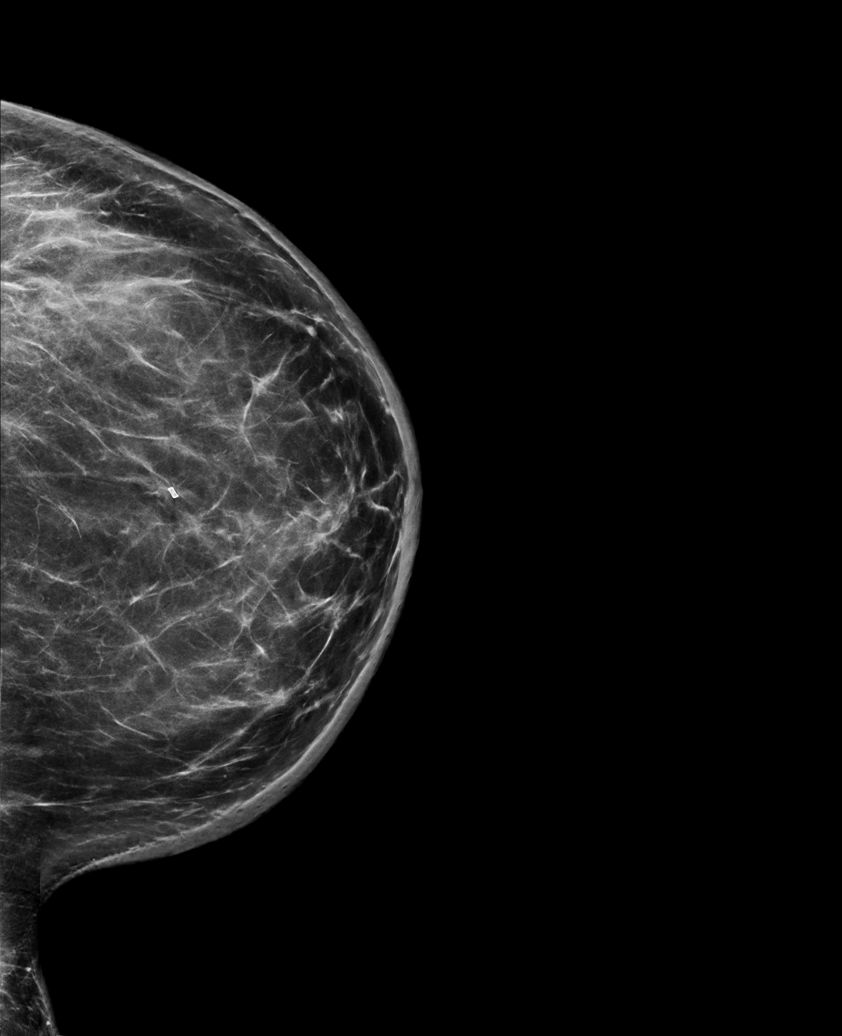

[R MLO synth-2D]
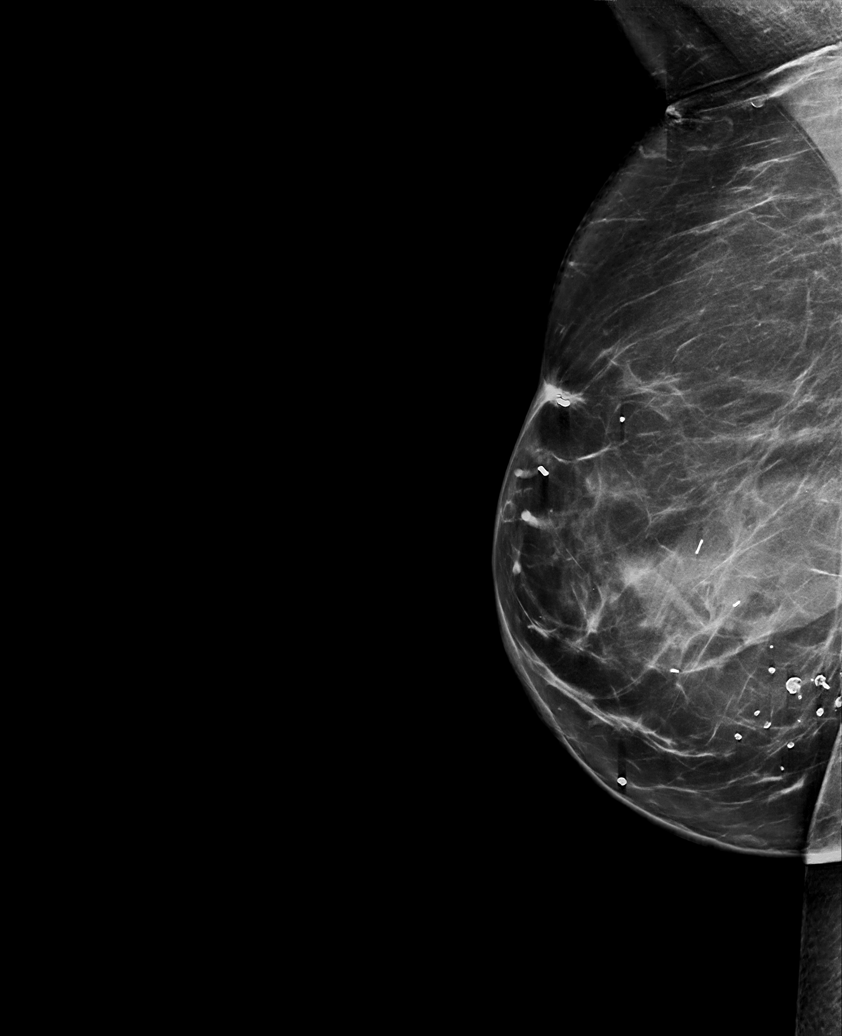

[6 of 26 positions shown; findings below may reference images not displayed]

ACR Breast Density Category b: There are scattered areas of
fibroglandular density.
FINDINGS: Lumpectomy changes are seen in both breast. No suspicious mass or
malignant type microcalcifications identified.
IMPRESSION: No evidence of malignancy in either breast.

RECOMMENDATION:
Bilateral diagnostic mammogram in 1 year is recommended.

I have discussed the findings and recommendations with the patient.
If applicable, a reminder letter will be sent to the patient
regarding the next appointment.

BI-RADS CATEGORY  2: Benign.

## 2021-02-15 ENCOUNTER — Other Ambulatory Visit: Payer: Self-pay | Admitting: Internal Medicine

## 2021-02-15 ENCOUNTER — Ambulatory Visit: Payer: Self-pay

## 2021-02-15 NOTE — Progress Notes (Signed)
   Covid-19 Vaccination Clinic  Name:  GERALDY AKRIDGE    MRN: [id number] DOB: 1961/05/16  02/15/2021  Ms. Duvall was observed post Covid-19 immunization for 15 minutes without incident. She was provided with Vaccine Information Sheet and instruction to access the V-Safe system.   Ms. Mitten was instructed to call 911 with any severe reactions post vaccine: Marland Kitchen Difficulty breathing  . Swelling of face and throat  . A fast heartbeat  . A bad rash all over body  . Dizziness and weakness

## 2021-03-23 ENCOUNTER — Other Ambulatory Visit: Payer: Self-pay

## 2021-03-23 ENCOUNTER — Other Ambulatory Visit: Payer: Self-pay | Admitting: Oncology

## 2021-03-23 MED ORDER — EXEMESTANE 25 MG PO TABS
ORAL_TABLET | ORAL | 3 refills | Status: DC
  Filled 2021-03-23: qty 30, 30d supply, fill #0
  Filled 2021-04-27: qty 30, 30d supply, fill #1
  Filled 2021-05-26: qty 30, 30d supply, fill #2
  Filled 2021-06-28: qty 30, 30d supply, fill #3

## 2021-03-23 MED FILL — Trazodone HCl Tab 50 MG: ORAL | 30 days supply | Qty: 30 | Fill #0 | Status: AC

## 2021-04-27 ENCOUNTER — Other Ambulatory Visit: Payer: Self-pay

## 2021-05-04 ENCOUNTER — Other Ambulatory Visit: Payer: Self-pay

## 2021-05-04 DIAGNOSIS — C50911 Malignant neoplasm of unspecified site of right female breast: Secondary | ICD-10-CM

## 2021-05-04 DIAGNOSIS — C50912 Malignant neoplasm of unspecified site of left female breast: Secondary | ICD-10-CM

## 2021-05-05 ENCOUNTER — Other Ambulatory Visit: Payer: Self-pay

## 2021-05-05 DIAGNOSIS — C50911 Malignant neoplasm of unspecified site of right female breast: Secondary | ICD-10-CM

## 2021-05-05 DIAGNOSIS — C50912 Malignant neoplasm of unspecified site of left female breast: Secondary | ICD-10-CM

## 2021-05-09 ENCOUNTER — Other Ambulatory Visit: Payer: Self-pay

## 2021-05-09 ENCOUNTER — Encounter: Payer: Self-pay | Admitting: Oncology

## 2021-05-09 ENCOUNTER — Inpatient Hospital Stay: Payer: 59 | Attending: Oncology | Admitting: Oncology

## 2021-05-09 VITALS — BP 143/99 | HR 100 | Temp 98.2°F | Resp 17 | Wt 205.6 lb

## 2021-05-09 DIAGNOSIS — C50912 Malignant neoplasm of unspecified site of left female breast: Secondary | ICD-10-CM | POA: Diagnosis not present

## 2021-05-09 DIAGNOSIS — Z79810 Long term (current) use of selective estrogen receptor modulators (SERMs): Secondary | ICD-10-CM | POA: Insufficient documentation

## 2021-05-09 DIAGNOSIS — Z9221 Personal history of antineoplastic chemotherapy: Secondary | ICD-10-CM | POA: Insufficient documentation

## 2021-05-09 DIAGNOSIS — Z170 Estrogen receptor positive status [ER+]: Secondary | ICD-10-CM | POA: Diagnosis not present

## 2021-05-09 DIAGNOSIS — Z79811 Long term (current) use of aromatase inhibitors: Secondary | ICD-10-CM | POA: Diagnosis not present

## 2021-05-09 DIAGNOSIS — Z08 Encounter for follow-up examination after completed treatment for malignant neoplasm: Secondary | ICD-10-CM | POA: Diagnosis not present

## 2021-05-09 DIAGNOSIS — M8580 Other specified disorders of bone density and structure, unspecified site: Secondary | ICD-10-CM | POA: Diagnosis not present

## 2021-05-09 DIAGNOSIS — Z923 Personal history of irradiation: Secondary | ICD-10-CM | POA: Insufficient documentation

## 2021-05-09 DIAGNOSIS — Z853 Personal history of malignant neoplasm of breast: Secondary | ICD-10-CM

## 2021-05-09 NOTE — Progress Notes (Signed)
Hematology/Oncology Consult note [facility]  [phone] [phone]  Patient Care Team: Idelle Crouch, MD as PCP - General (Internal Medicine) Theodore Demark, RN as Oncology Nurse Navigator Noreene Filbert, MD as Radiation Oncologist (Radiation Oncology) Jeral Fruit, RN as Registered Nurse   Name of the patient: Marissa Phillips  [id number]  1961/05/29   Date of visit: 05/09/21  Diagnosis- bilateral breast cancer ER/PR positive and HER-2/neu negative s/p bilateral lumpectomy  Chief complaint/ Reason for visit-routine follow-up of breast cancer  Heme/Onc history: Patient is a 60 year old female who recently underwent a bilateral screening mammogram on 07/13/2020 which showed possible mass in both the right and left breast.  This was followed by a diagnostic mammogram and ultrasound.  Right breast demonstrated 0.6 x 0.5 x 0.5 cm hypoechoic mass in the left breast demonstrated 0.5 x 0.5 x 0.4 cm hypoechoic mass.  There was an abnormal right axillary lymph node noted with cortical thickening.  All these areas were biopsied.  Right axillary lymph node was negative for malignancy.  Right breast mass showed invasive mammary carcinoma 6 mm grade 3.  Left breast mass showed invasive mammary carcinoma with lobular features 3 mm grade 2.  ER/PR and HER-2 status was pending at the time of my visit   Patient has a prior history of ER positive breast cancer back in 2005.  She is s/p surgery and radiation and hormone therapy at that time for 5 years   Bilateral breast MRI showed biopsy-proven malignancy in bilateral breasts indeterminate non-mass enhancement adjacent to prior right breast lumpectomy and enhancing focus in the central left breast.Biopsy of both the sites revealed fat necrosis of the right breast and fibrocystic changes in the left   Patient went for bilateral lumpectomy.  Right breast pathology showed 0.6 cm invasive ductal carcinoma  grade 2 with negative margins and left breast showed 1.2 cm invasive lobular carcinoma.  1 sentinel lymph node on the left side was negative for malignancy.  Both these tumors were ER positive and HER-2 negative.  PR negative.   Oncotype testing was sent on both right and left breast specimens and both came back with an Oncotype score of 24 which puts her at intermediate risk for age.  She would not benefit from adjuvant chemotherapy.  She completed adjuvant radiation therapy and started Aromasin in September 2021    Interval history-patient reports doing well and is tolerating Aromasin without any significant side effects.  Her mood and fatigue have improved.  Sleep is better after starting trazodone.  ECOG PS- 1 Pain scale- 0 Opioid associated constipation- no  Review of systems- Review of Systems  Constitutional:  Negative for chills, fever, malaise/fatigue and weight loss.  HENT:  Negative for congestion, ear discharge and nosebleeds.   Eyes:  Negative for blurred vision.  Respiratory:  Negative for cough, hemoptysis, sputum production, shortness of breath and wheezing.   Cardiovascular:  Negative for chest pain, palpitations, orthopnea and claudication.  Gastrointestinal:  Negative for abdominal pain, blood in stool, constipation, diarrhea, heartburn, melena, nausea and vomiting.  Genitourinary:  Negative for dysuria, flank pain, frequency, hematuria and urgency.  Musculoskeletal:  Negative for back pain, joint pain and myalgias.  Skin:  Negative for rash.  Neurological:  Negative for dizziness, tingling, focal weakness, seizures, weakness and headaches.  Endo/Heme/Allergies:  Does not bruise/bleed easily.  Psychiatric/Behavioral:  Negative for depression and suicidal ideas. The patient does not have insomnia.      Allergies  Allergen Reactions   Celebrex [Celecoxib] Rash   Clindamycin Hcl Rash   Loratadine Other (See Comments)    fevers   Omeprazole Other (See Comments)     After 1 week of therapy dry mouth and mild nighttime urinary frequency -bothersome enough for her stop it.    Penicillins Rash    Slight redness and itching    Sulfa Antibiotics Rash     Past Medical History:  Diagnosis Date   Asthma    Breast cancer ([place]) 2005   Cancer [facility])    Breast- Right-2005   Osteopenia    Personal history of chemotherapy    Personal history of radiation therapy    PONV (postoperative nausea and vomiting)      Past Surgical History:  Procedure Laterality Date   BREAST BIOPSY Right 2005   +   BREAST BIOPSY Right 03/01/2020   [place] bx of mass at 2:00 heart marker, path pending   BREAST BIOPSY Left 03/01/2020   [place] bx mass at 3:00, coil marker, path pending   BREAST BIOPSY Right 03/01/2020   [place] bx of right axilla, no marker placed, path pending   BREAST LUMPECTOMY Right 2005   BREAST LUMPECTOMY WITH NEEDLE LOCALIZATION AND AXILLARY SENTINEL LYMPH NODE BX Right 2005   BREAST LUMPECTOMY WITH RADIOACTIVE SEED AND SENTINEL LYMPH NODE BIOPSY Bilateral 04/14/2020   Procedure: BILATERAL RADIOACTIVE SEED GUIDED BREAST LUMPECTOMIES, BILATERAL AXILLARY SENTINEL LYMPH NODE BIOPSIES, WITH BLUE DYE INJECTION RIGHT BREAST;  Surgeon: Alphonsa Overall, MD;  Location: [facility];  Service: General;  Laterality: Bilateral;   BREAST SURGERY     COLONOSCOPY WITH PROPOFOL N/A 04/02/2017   Procedure: COLONOSCOPY WITH PROPOFOL;  Surgeon: Jonathon Bellows, MD;  Location[facility] ENDOSCOPY;  Service: Endoscopy;  Laterality: N/A;   ESOPHAGOGASTRODUODENOSCOPY (EGD) WITH PROPOFOL N/A 04/02/2017   Procedure: ESOPHAGOGASTRODUODENOSCOPY (EGD) WITH PROPOFOL;  Surgeon: Jonathon Bellows, MD;  Location: [facility] ENDOSCOPY;  Service: Endoscopy;  Laterality: N/A;   TONSILLECTOMY  ~2000    Social History   Socioeconomic History   Marital status: Married    Spouse name: Not on file   Number of children: Not on file   Years of education: Not on file   Highest education level: Not on file  Occupational History   Not  on file  Tobacco Use   Smoking status: Never   Smokeless tobacco: Never  Vaping Use   Vaping Use: Never used  Substance and Sexual Activity   Alcohol use: No   Drug use: No   Sexual activity: Not on file  Other Topics Concern   Not on file  Social History Narrative   Not on file   Social Determinants of Health   Financial Resource Strain: Not on file  Food Insecurity: Not on file  Transportation Needs: Not on file  Physical Activity: Not on file  Stress: Not on file  Social Connections: Not on file  Intimate Partner Violence: Not on file    Family History  Problem Relation Age of Onset   Cancer Mother 46       Breast, now bilateral   Breast cancer Mother 69       right breast ca then left breast ca    Leukemia Mother    Heart attack Father    Hepatitis Father      Current Outpatient Medications:    acetaminophen (TYLENOL) 500 MG tablet, Take 500-1,000 mg by mouth 2 (two) times daily as needed (pain.)., Disp: , Rfl:    calcium-vitamin D (  OSCAL WITH D) 500-200 MG-UNIT tablet, Take 1 tablet by mouth., Disp: , Rfl:    cetirizine (ZYRTEC) 10 MG tablet, Take 10 mg by mouth daily., Disp: , Rfl:    Cholecalciferol (DIALYVITE VITAMIN D 5000 PO), Take 5,000 Units by mouth daily., Disp: , Rfl:    COVID-19 mRNA Vac-TriS, Pfizer, SUSP injection, USE AS DIRECTED, Disp: .3 mL, Rfl: 0   famotidine (PEPCID) 20 MG tablet, Take 20 mg by mouth daily as needed for heartburn or indigestion., Disp: , Rfl:    L-Theanine 200 MG CAPS, Take 200-600 mg by mouth at bedtime as needed (sleep)., Disp: , Rfl:    naproxen sodium (ALEVE) 220 MG tablet, Take 220-440 mg by mouth daily as needed (pain.)., Disp: , Rfl:    traZODone (DESYREL) 50 MG tablet, TAKE 1 TABLET BY MOUTH NIGHTLY AS NEEDED FOR SLEEP, Disp: 30 tablet, Rfl: 11   exemestane (AROMASIN) 25 MG tablet, TAKE 1 TABLET BY MOUTH DAILY AFTER BREAKFAST., Disp: 30 tablet, Rfl: 3  Physical exam:  Vitals:   05/09/21 1104  BP: (!) 143/99  Pulse:  100  Resp: 17  Temp: 98.2 F (36.8 C)  SpO2: 99%  Weight: 205 lb 9.6 oz (93.3 kg)   Physical Exam Cardiovascular:     Rate and Rhythm: Normal rate and regular rhythm.     Heart sounds: Normal heart sounds.  Pulmonary:     Effort: Pulmonary effort is normal.     Breath sounds: Normal breath sounds.  Skin:    General: Skin is warm and dry.  Neurological:     Mental Status: She is alert and oriented to person, place, and time.  Breast exam: Patient is s/p bilateral lumpectomy with a well-healed surgical scar.  No palpable bilateral breast masses.  No palpable bilateral axillary adenopathy.  CMP Latest Ref Rng & Units 09/29/2020  Glucose 70 - 99 mg/dL 92  BUN 6 - 20 mg/dL 14  Creatinine 0.44 - 1.00 mg/dL 0.75  Sodium 135 - 145 mmol/L 139  Potassium 3.5 - 5.1 mmol/L 3.7  Chloride 98 - 111 mmol/L 104  CO2 22 - 32 mmol/L 29  Calcium 8.9 - 10.3 mg/dL 9.4  Total Protein 6.5 - 8.1 g/dL 7.5  Total Bilirubin 0.3 - 1.2 mg/dL 0.5  Alkaline Phos 38 - 126 U/L 77  AST 15 - 41 U/L 23  ALT 0 - 44 U/L 19   CBC Latest Ref Rng & Units 04/11/2020  WBC 4.0 - 10.5 K/uL 4.5  Hemoglobin 12.0 - 15.0 g/dL 14.3  Hematocrit 36.0 - 46.0 % 45.3  Platelets 150 - 400 K/uL 250     Assessment and plan- Patient is a 60 y.o. female prior history of right breast cancer now with bilateral breast cancer stage I ER positive and HER-2 negative s/p bilateral lumpectomy and left sentinel lymph node biopsy.  She did not require adjuvant chemotherapy based on her Oncotype score.  She completed adjuvant radiation therapy and is currently on Aromasin.  This is a routine follow-up visit for breast cancer:  Clinically patient is doing well with no concerning signs and symptoms of recurrence based on today's exam.  She is tolerating Aromasin well and will continue to take it for 5 years.  Recent mammogram from March 2022 was unremarkable.  Patient is also following up with Dr. Barry Dienes as well as Dr. Donella Stade in the future.  I  will see her back in 7 months with labs   Visit Diagnosis 1. Encounter for follow-up surveillance  of breast cancer   2. Use of exemestane (Aromasin)      Dr. Randa Evens, MD, MPH [facility] at [facility] [phone] 05/09/2021 4:05 PM

## 2021-05-26 ENCOUNTER — Other Ambulatory Visit: Payer: Self-pay

## 2021-05-26 MED FILL — Trazodone HCl Tab 50 MG: ORAL | 30 days supply | Qty: 30 | Fill #1 | Status: AC

## 2021-06-03 ENCOUNTER — Encounter (HOSPITAL_COMMUNITY): Payer: Self-pay

## 2021-06-28 ENCOUNTER — Other Ambulatory Visit: Payer: Self-pay

## 2021-06-28 MED FILL — Trazodone HCl Tab 50 MG: ORAL | 30 days supply | Qty: 30 | Fill #2 | Status: AC

## 2021-07-12 DIAGNOSIS — C50811 Malignant neoplasm of overlapping sites of right female breast: Secondary | ICD-10-CM | POA: Diagnosis not present

## 2021-07-12 DIAGNOSIS — Z124 Encounter for screening for malignant neoplasm of cervix: Secondary | ICD-10-CM | POA: Diagnosis not present

## 2021-07-12 DIAGNOSIS — C50812 Malignant neoplasm of overlapping sites of left female breast: Secondary | ICD-10-CM | POA: Diagnosis not present

## 2021-07-12 DIAGNOSIS — E785 Hyperlipidemia, unspecified: Secondary | ICD-10-CM | POA: Diagnosis not present

## 2021-07-13 ENCOUNTER — Telehealth: Payer: Self-pay | Admitting: Emergency Medicine

## 2021-07-13 NOTE — Telephone Encounter (Signed)
WF [id number] Understanding and Predicting Breast Cancer Events after Treatment (UPBEAT)  07/13/21 1:29 PM   Called to remind patient of research appointment scheduled for tomorrow.  Patient did not answer.  Left voicemail reminding patient to fast for 3 hrs prior to lab appointment tomorrow at 9:45am and to wear comfortable clothing.  Clabe Seal Clinical Research Coordinator I  07/13/21  1:30 PM

## 2021-07-14 ENCOUNTER — Ambulatory Visit
Admission: RE | Admit: 2021-07-14 | Discharge: 2021-07-14 | Disposition: A | Discharge status: Home / Self Care | Payer: 59 | Source: Ambulatory Visit | Attending: Radiation Oncology | Admitting: Radiation Oncology

## 2021-07-14 ENCOUNTER — Inpatient Hospital Stay: Payer: 59 | Attending: Oncology

## 2021-07-14 ENCOUNTER — Inpatient Hospital Stay: Payer: 59

## 2021-07-14 VITALS — BP 120/53 | HR 86 | Temp 96.9°F | Resp 16

## 2021-07-14 VITALS — Ht 66.0 in

## 2021-07-14 DIAGNOSIS — Z853 Personal history of malignant neoplasm of breast: Secondary | ICD-10-CM | POA: Diagnosis not present

## 2021-07-14 DIAGNOSIS — C50911 Malignant neoplasm of unspecified site of right female breast: Secondary | ICD-10-CM

## 2021-07-14 NOTE — Research (Signed)
WF [id number] Understanding and Predicting Breast Cancer Events after Treatment (UPBEAT)  07/14/21 - 12 month visit  Vitals:  Vital signs including blood pressure, pulse, height, and weight were collected per protocol.  Waist circumference measured 44.5 inches.    Labs:   Required [facility] research labs for this visit were collected at [place] by phlebotomist Charlesetta Ivory.  Patient states she has been fasting for greater than 3 hours prior to lab draw.  Medication review:   The patient's medication list was reviewed and updated.  Cardiovascular events:   The cardiovascular events form was completed. Patient denies hospitalization or visit to the ER since last research visit on 09/29/2020.  She also denies new MI, PCI, CABG, Catheterization, Stroke, or Heart failure since last research visit.  Neurocognitive testing: Neurocognitive function testing was performed by this clinical research coordinator according to protocol.  Physical Assessments: 6 minute walk, disability measures, and expanded SPPB were administered per protocol by this Research officer, political party.    Gift Card: A gift card was given to the patient by Mauricio Po for participation in today's required research assessments.    The patient was thanked for her time and participation in this study.  She was informed the next required research visit would occur in approximately one year.   She was given this coordinator's contact information and advised to call with any questions.  Clabe Seal Clinical Research Coordinator I  07/14/21 1:01 PM

## 2021-07-14 NOTE — Progress Notes (Signed)
Radiation Oncology Follow up Note  Name: Marissa Phillips   Date:   07/14/2021 MRN:  [id number] DOB: 08-Jan-1961    This 60 y.o. female presents to the clinic today for 1 year follow-up status post bilateral radiation to her breasts in patient treated to her right breast in 2006.  She at this time she received partial breast radiation to her right breast and whole breast radiation to her left.Marland Kitchen  REFERRING PROVIDER: Idelle Crouch, MD  HPI: Patient is a 60 year old female now out 1 year having completed bilateral breast radiation.  She had partial breast radiation to her right and whole breast to her left.  She is seen today in routine follow-up she is doing well.  Most of the swelling and pain in her breast have subsided.  She specifically denies breast tenderness cough or bone pain..  Patient underwent mammogram back in March which I have reviewed showing BI-RADS 2 benign.  She is currently on Aromasin tolerating it well without side effect. COMPLICATIONS OF TREATMENT: none  FOLLOW UP COMPLIANCE: keeps appointments   PHYSICAL EXAM:  BP (!) 120/53   Pulse 86   Temp (!) 96.9 F (36.1 C)   Resp 16   Wt (P) 204 lb 12.8 oz (92.9 kg)   LMP  (LMP Unknown)   BMI (P) 33.06 kg/m  Lungs are clear to A&P cardiac examination essentially unremarkable with regular rate and rhythm. No dominant mass or nodularity is noted in either breast in 2 positions examined. Incision is well-healed. No axillary or supraclavicular adenopathy is appreciated. Cosmetic result is excellent.  Well-developed well-nourished patient in NAD. HEENT reveals PERLA, EOMI, discs not visualized.  Oral cavity is clear. No oral mucosal lesions are identified. Neck is clear without evidence of cervical or supraclavicular adenopathy. Lungs are clear to A&P. Cardiac examination is essentially unremarkable with regular rate and rhythm without murmur rub or thrill. Abdomen is benign with no organomegaly or masses noted. Motor sensory and DTR  levels are equal and symmetric in the upper and lower extremities. Cranial nerves II through XII are grossly intact. Proprioception is intact. No peripheral adenopathy or edema is identified. No motor or sensory levels are noted. Crude visual fields are within normal range.  RADIOLOGY RESULTS: Mammograms reviewed compatible with above-stated findings  PLAN: Present time patient is doing well with no evidence of disease 1 year out.  And pleased with her overall progress.  I have asked to see her back in 1 year for follow-up.  She continues on Aromasin without side effect.  Patient knows to call with any concerns.  I would like to take this opportunity to thank you for allowing me to participate in the care of your patient.Noreene Filbert, MD

## 2021-07-26 ENCOUNTER — Telehealth: Payer: Self-pay | Admitting: Emergency Medicine

## 2021-07-26 NOTE — Telephone Encounter (Signed)
WF [id number] Understanding and Predicting Breast Cancer Events after Treatment (UPBEAT)  07/26/21 2:45pm  Called patient to inform patient of her lab results from her month 12 research visit on 07/14/2021.  Lab results were normal except for LDL which was was elevated at 123 mg/dL. Dr. Janese Banks reviewed labs on 07/24/2021, advised no action necessary.  Patient denied questions at this time.  Clabe Seal Clinical Research Coordinator I  07/26/21  2:51 PM

## 2021-07-27 ENCOUNTER — Other Ambulatory Visit: Payer: Self-pay

## 2021-07-27 ENCOUNTER — Other Ambulatory Visit: Payer: Self-pay | Admitting: Oncology

## 2021-07-27 MED ORDER — EXEMESTANE 25 MG PO TABS
ORAL_TABLET | ORAL | 3 refills | Status: DC
  Filled 2021-07-27: qty 30, 30d supply, fill #0
  Filled 2021-08-31: qty 30, 30d supply, fill #1
  Filled 2021-10-03: qty 30, 30d supply, fill #2
  Filled 2021-10-31: qty 30, 30d supply, fill #3

## 2021-07-27 MED FILL — Trazodone HCl Tab 50 MG: ORAL | 30 days supply | Qty: 30 | Fill #3 | Status: AC

## 2021-08-31 ENCOUNTER — Other Ambulatory Visit: Payer: Self-pay

## 2021-10-03 ENCOUNTER — Other Ambulatory Visit: Payer: Self-pay

## 2021-10-11 ENCOUNTER — Other Ambulatory Visit: Payer: Self-pay

## 2021-10-11 MED FILL — Trazodone HCl Tab 50 MG: ORAL | 30 days supply | Qty: 30 | Fill #4 | Status: AC

## 2021-10-31 ENCOUNTER — Other Ambulatory Visit: Payer: Self-pay

## 2021-10-31 DIAGNOSIS — Z170 Estrogen receptor positive status [ER+]: Secondary | ICD-10-CM | POA: Diagnosis not present

## 2021-10-31 DIAGNOSIS — I890 Lymphedema, not elsewhere classified: Secondary | ICD-10-CM | POA: Insufficient documentation

## 2021-10-31 DIAGNOSIS — C50911 Malignant neoplasm of unspecified site of right female breast: Secondary | ICD-10-CM | POA: Diagnosis not present

## 2021-10-31 DIAGNOSIS — C50912 Malignant neoplasm of unspecified site of left female breast: Secondary | ICD-10-CM | POA: Diagnosis not present

## 2021-11-01 ENCOUNTER — Telehealth: Payer: Self-pay | Admitting: Oncology

## 2021-11-01 NOTE — Telephone Encounter (Signed)
Spoke with patient. Got her rescheduled to 1/30--patient is going on a retreat.

## 2021-11-01 NOTE — Telephone Encounter (Signed)
Pt called in to reschedule her appt on 1-13. Wants to move it out a couple of weeks. Call back at [phone]

## 2021-11-16 DIAGNOSIS — H5213 Myopia, bilateral: Secondary | ICD-10-CM | POA: Diagnosis not present

## 2021-11-29 ENCOUNTER — Encounter: Payer: Self-pay | Admitting: Oncology

## 2021-12-07 ENCOUNTER — Ambulatory Visit: Payer: 59 | Attending: General Surgery | Admitting: Occupational Therapy

## 2021-12-07 ENCOUNTER — Encounter: Payer: Self-pay | Admitting: Occupational Therapy

## 2021-12-07 ENCOUNTER — Other Ambulatory Visit: Payer: Self-pay

## 2021-12-07 DIAGNOSIS — I972 Postmastectomy lymphedema syndrome: Secondary | ICD-10-CM | POA: Diagnosis not present

## 2021-12-08 ENCOUNTER — Encounter: Payer: Self-pay | Admitting: Occupational Therapy

## 2021-12-08 ENCOUNTER — Ambulatory Visit: Payer: 59 | Admitting: Oncology

## 2021-12-08 ENCOUNTER — Other Ambulatory Visit: Payer: 59

## 2021-12-08 NOTE — Therapy (Signed)
[place] MAIN [facility] SERVICES [address] [place], [place], [address] Phone: [phone]   Fax:  [phone]  Occupational Therapy Evaluation  Patient Details  Name: Marissa Phillips MRN: [id number] Date of Birth: [birth year] Referring Provider (OT): Thea Silversmith, [place]   Encounter Date: 12/07/2021   OT End of Session - 12/07/21 0911     Visit Number 1    Number of Visits 36    Date for OT Re-Evaluation 03/07/22    OT Start Time 0805    OT Stop Time 0910    OT Time Calculation (min) 65 min    Activity Tolerance Patient tolerated treatment well;No increased pain             Past Medical History:  Diagnosis Date   Asthma    Breast cancer ([place]) 2005   Cancer [facility])    Breast- Right-2005   Osteopenia    Personal history of chemotherapy    Personal history of radiation therapy    PONV (postoperative nausea and vomiting)     Past Surgical History:  Procedure Laterality Date   BREAST BIOPSY Right 2005   +   BREAST BIOPSY Right 03/01/2020   [place] bx of mass at 2:00 heart marker, path pending   BREAST BIOPSY Left 03/01/2020   [place] bx mass at 3:00, coil marker, path pending   BREAST BIOPSY Right 03/01/2020   [place] bx of right axilla, no marker placed, path pending   BREAST LUMPECTOMY Right 2005   BREAST LUMPECTOMY WITH NEEDLE LOCALIZATION AND AXILLARY SENTINEL LYMPH NODE BX Right 2005   BREAST LUMPECTOMY WITH RADIOACTIVE SEED AND SENTINEL LYMPH NODE BIOPSY Bilateral 04/14/2020   Procedure: BILATERAL RADIOACTIVE SEED GUIDED BREAST LUMPECTOMIES, BILATERAL AXILLARY SENTINEL LYMPH NODE BIOPSIES, WITH BLUE DYE INJECTION RIGHT BREAST;  Surgeon: Alphonsa Overall, MD;  Location: [place];  Service: General;  Laterality: Bilateral;   BREAST SURGERY     COLONOSCOPY WITH PROPOFOL N/A 04/02/2017   Procedure: COLONOSCOPY WITH PROPOFOL;  Surgeon: Jonathon Bellows, MD;  Location: [facility] ENDOSCOPY;  Service: Endoscopy;  Laterality: N/A;    ESOPHAGOGASTRODUODENOSCOPY (EGD) WITH PROPOFOL N/A 04/02/2017   Procedure: ESOPHAGOGASTRODUODENOSCOPY (EGD) WITH PROPOFOL;  Surgeon: Jonathon Bellows, MD;  Location: [facility] ENDOSCOPY;  Service: Endoscopy;  Laterality: N/A;   TONSILLECTOMY  ~2000    There were no vitals filed for this visit.   Subjective Assessment - 12/07/21 0828     Subjective  Pt was diagnosed with bilateral breast cancer 06/2020. Diagnosed imaging showed a 6 mm mass in the right breast and a 5 mm mass in the left breast. Right breast mass showed invasive mammary carcinoma 6 mm grade 3.  Left breast mass showed invasive mammary carcinoma with lobular features 3 mm grade 2. She underwent bilateral lumpectomies 04/14/2020 by Dr Lucia Gaskins. Oncotype was 24 bilaterally. She received radiation with Dr. Baruch Gouty (partial on the right given prior whole breast radiation in 2005). She subsequently started on Arimidex at the direction of Dr Janese Banks. Final path was right side pT1bNx, +/-/-. Left side was pT1cN0, +/-/-.     Patient had a prior history of ER positive right breast cancer back in 2005.  She is s/p surgery and radiation and hormone therapy at that time for 5 years. At that time she had a micromet to one node and received 4 cycles of AC, 6 cycles of taxol/gemcitabine, and XRT. The chemo regimen was on a trial. Today Ms. Brilliant c/o of L breast heaviness and discomfort. She reports  onset of swelling during radiation course in 2021. She reports L breast swelling does not resolve with repositioning, but wearing a bra does give her some relief from physical and sensory symptoms. Ms. Chavis reports she knows little about lymphedema as a side effect of breast cancer treatment, "except for a little I've read". Her goal for Occupational Therapy and lymphedema care is to reduce  breast swelling and discomfort and to limit her infection risk.    Pertinent History S/p ER+ R br ca in 2005 w/ XRT and hormone therapy. SLNB? # of LN?Marland Kitchen Dx w B br ca 8/2021S/P B lumpectomies  04/14/20. S/P partial R breast XRT and and full br XRT on L. S/p SLND. # LN?. Pt reports seroma L axilla s/p 2021 sx with infection. Arimadex for 5 years and currently on Aromasin with good tolerance. Reported symptoms of AWS have resolved.    Limitations chronmic breast swelling and associated pain/discomfort. Impaired L shoulder internal rotation-pain at end range when dressing; difficulty sleeping;    Repetition Increases Symptoms    Special Tests Intake FOTO score :92/100    Patient Stated Goals reduce breast swelling and pain. limit infection risk    Currently in Pain? Yes    Pain Score --   not rated numerically   Pain Location Breast    Pain Orientation Left;Right    Pain Descriptors / Indicators Discomfort;Tightness;Heaviness;Other (Comment)   fullness   Pain Type Chronic pain    Pain Onset --   concurrent w/ XRT in 2021   Pain Frequency Intermittent    Aggravating Factors  overhead reaching, not wearing a bra    Pain Relieving Factors wearing a bra    Effect of Pain on Daily Activities distracting when working , limits upper body dressing               [facility] OT Assessment - 12/07/21 0851       Assessment   Medical Diagnosis Mild, stage II LUE/LUQ post-mastectomy lymphedema, L>R    Referring Provider (OT) Thea Silversmith, MD-[facility]    Onset Date/Surgical Date --   2021 concurrent w/ XRT   Hand Dominance Left    Prior Therapy no compression. No CDT      Precautions   Precautions Other (comment)   lymphedema precautions- hx cellulitis w seroma?     Balance Screen   Has the patient fallen in the past 6 months No      Home  Environment   Family/patient expects to be discharged to: Private residence      Prior Function   Level of Independence Independent;Independent with basic ADLs;Independent with household mobility without device;Independent with community mobility without device;Independent with homemaking with ambulation;Independent with gait;Independent with  transfers    Vocation Full time employment    Vocation Requirements full time computer use      IADL   Prior Level of Function Shopping I    Shopping Takes care of all shopping needs independently   some discomfort w overhead reaching   Prior Level of Function Light Housekeeping I    Light Housekeeping Does personal laundry completely;Maintains house alone or with occasional assistance    Prior Level of Function Meal Prep I    Meal Prep Plans, prepares and serves adequate meals independently    Prior Level of Function Nurse, children's I    Programmer, applications own Art therapist Status Independent      Vision - History  Baseline Vision Wears glasses all the time      Activity Tolerance   Activity Tolerance Endurance does not limit participation in activity      Cognition   Overall Cognitive Status Within Functional Limits for tasks assessed      Observation/Other Assessments   Focus on Therapeutic Outcomes (FOTO)  92/100      Posture/Postural Control   Posture/Postural Control No significant limitations      Sensation   Light Touch Appears Intact      Coordination   Gross Motor Movements are Fluid and Coordinated Yes      AROM   Overall AROM  Within functional limits for tasks performed    Overall AROM Comments discomfort with L internal rotation at end range    AROM Assessment Site Shoulder;Elbow;Forearm;Wrist;Finger;Thumb      Strength   Overall Strength Within functional limits for tasks performed    Strength Assessment Site Shoulder;Elbow;Forearm;Wrist;Hand      Hand Function   Right Hand Gross Grasp Functional    Right Hand Grip (lbs) WFL    Left Hand Gross Grasp Functional    Left Hand Grip (lbs) WFL            Mild, Stage II , Post-mastectomy Lymphedema   Skin  Description Hyper-Keratosis Peau' de Orange Lichenification Lipodermatosclerosis Fibrotic/ Indurated Fatty Doughy spongy   X L>R L breast   Dense L breast Fatty  fibrosis L lateral trunk         Skin dry Flaky WNL Macerated     x    Color Redness Present Pallor Blanching Hemosiderin Staining hyperpigmentation   x     x   Odor Malodorous Yeast Fungal infection  Absent      x   Temperature Warm Cool wnl     x    Pitting Edema   1+ 2+ 3+ 4+ Non-pitting        x   Girth Symmetrical Asymmetrical                   Distribution    L>R  L breast, axilla, lateral and posterior lateral L trunk. Slight R breast     Stemmer Sign Positive Negative    x   Lymphorrhea History Of:  Present Absent     x    Wounds History Of Present Absent Venous Arterial Pressure Mechanical             Signs of Infection Redness Warmth Erythema Acute Swelling Drainage Borders                    Sensation Light Touch Deep pressure Hyper sensitivity    Present Impaired Present Impaired Absent Impaired    x x  x     Nails WNL Fungus nail dystrophy   x     Hair Growth Symmetrical Asymmetrical   x    Skin Creases Base of toes  Ankles   Base of Fingers Medial Thighs         Abdominal pannus Lobules  Face/neck   x x  x   x             OT Treatments/Exercises (OP) - 12/08/21 3614       Transfers   Transfers Sit to Stand;Stand to Sit    Sit to Stand 7: Independent    Stand to Sit 7: Independent      ADLs   Overall ADLs Overall independent with basic self  care. Does have some pain at end range with internal rotation when fastening bra.    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 Taught interaction between blood circulatory system and lymphatic circulation.Discussed  impact of gravity and co-morbidities on lymphatic function. Outlined  Complete Decongestive Therapy (CDT)  as standard of care and provided in depth information regarding 4 primary components of Intensive and Self Management Phases, including Manual Lymph Drainage (MLD), compression wrapping and garments, skin care, and therapeutic exercise. Pilar Plate discussion with re need for frequent attendance and high burden of care when caregiver is needed, impact of co morbidities. We discussed  the chronic, progressive nature of lymphedema and Importance of daily, ongoing LE self-care essential for limiting progression and infection risk.    Person(s) Educated Patient    Methods Explanation;Demonstration;Handout    Comprehension Verbalized understanding;Returned demonstration                 OT Long Term Goals - 12/08/21 0953       OT LONG TERM GOAL #1   Title Given this patients Intake score of 92/100 on the functional outcomes FOTO tool, patient will experience an increase in function of 5 points to improve basic and instrumental ADLs performance, including lymphedema self-care.    Baseline 92/100    Time 12    Period Weeks    Status New    Target Date 03/07/22      OT LONG TERM GOAL #2   Title Pt will demonstrate understanding of lymphedema prevention strategies by identifying signs and symptoms of cellulitis infection and by naming/ discussing 3 prevention strategies using printed reference (modified assistance) to reduce risk of progression and to limit infection risk.    Baseline Max A    Time 4    Period Days    Status New    Target Date --   4th OT Rx visit     OT LONG TERM GOAL #3   Title Pt will achieve at least a 10% volume reduction in the L breast to return it to more typical  size and shape,  to limit lymphedema progression and to limit infection risk.    Baseline Measurements TBA at OT Rx visit 1    Time 12    Period Weeks    Status New    Target Date 03/07/22      OT LONG TERM GOAL #5   Title Pt will achieve and sustain a least 85%  compliance with all 4 LE self-care home program components throughout Intensive Phase CDT, including modified simple self-MLD, daily skin inspection and care, lymphatic pumping the ex and compression to sustain clinical gains made in CDT and to limit lymphedema progression and further functional decline.    Baseline Max A    Time 12    Period Weeks    Status New    Target Date 03/07/22      Long Term Additional Goals   Additional Long Term Goals Yes      OT LONG TERM GOAL #6   Title Pt will be able to perform all lymphedema self-care home program  components using correct techniques with extra time and assistive devices PRN (modified independence), including simple self MLD, lymphatic pumping exercise, don and doff appropriate compression garments/ devices and perform daily skin care regime to limit lymphedema progression and infection risk.    Baseline Max A    Time 12    Period Weeks    Status New    Target Date 03/07/22                   Plan - 12/08/21 0854     Clinical Impression Statement Maikayla Beggs presents with mild, stage II, post-mastectomy lymphedema, L>R. Chronic, progressive swelling in the L breast, lateral and posteriorlateral trunk and axilla limits basic ADLs, work performance and sleep. R breast swelling is minimal, but  generalized fibrosis is palpable. Ms. Deist will benefit from skilled Occupational Therapy to reduce and control swelling, to limit infection risk, and to teach Pt lymphedema self-care for home management over time. Without skilled OT, lymphedema will progress and further functional decline is expected.    OT Occupational Profile and History Comprehensive Assessment- Review of records and extensive additional review of physical, cognitive, psychosocial history related to current functional performance    Occupational performance deficits (Please refer to evaluation for details): ADL's;Rest and Sleep;Work    Marketing executive / Function / Physical  Skills UE functional use;Decreased knowledge of precautions;Decreased knowledge of use of DME;IADL;Skin integrity;Pain;Edema;ROM    Rehab Potential Good    Clinical Decision Making Limited treatment options, no task modification necessary    Comorbidities Affecting Occupational Performance: May have comorbidities impacting occupational performance    Comorbidities impacting occupational performance description: hx L axillary seroma st surgical site with cellulitis    Modification or Assistance to Complete Evaluation  No modification of tasks or assist necessary to complete eval    OT Frequency 2x / week    OT Duration 12 weeks   and PRN   OT Treatment/Interventions Self-care/ADL training;Therapeutic exercise;DME and/or AE instruction;Other (comment);Manual Therapy;Scar mobilization;Coping strategies training;Patient/family education;Therapeutic activities;Manual lymph drainage   breast and trunk compression-alternative   Plan Complete Decongestive Therapy to include MLD, skin care, therapeutic exercise, compression. Consider fitting Pt with compression bra, convoluted foam insert for HOS to reduce fibrosis. Consider kinesiotape, Consider advanced sequential pneumatic compression device (Flexitouch),    Consulted and Agree with Plan of Care Patient             Patient will benefit from skilled therapeutic intervention in order to improve the following deficits and impairments:   Body Structure / Function / Physical Skills: UE functional use, Decreased knowledge of precautions, Decreased knowledge of use of DME, IADL, Skin integrity, Pain, Edema, ROM       Visit Diagnosis: Post-mastectomy lymphedema syndrome - Plan: Ot plan of care cert/re-cert    Problem List Patient Active Problem List   Diagnosis Date Noted   Goals of care, counseling/discussion 03/10/2020   Bilateral malignant neoplasm of breast in female [facility]) 03/10/2020   Hx of adenomatous colonic polyps 04/29/2018    Hyperlipidemia, mild 01/02/2016   Anemia 07/06/2014   Asthma 07/06/2014   Environmental allergies 07/06/2014   GERD (gastroesophageal reflux disease) 07/06/2014   History of recurrent UTIs 07/06/2014   Osteoporosis 07/06/2014   Screening for cervical cancer 09/25/2011   History of breast cancer 09/25/2011   Andrey Spearman, MS, OTR/L, [facility] 12/08/21 10:01 AM   [place] MAIN [facility] SERVICES [address] [place], [place], [address] Phone: [phone]  Fax:  [phone]  Name: ALYAH BOEHNING MRN: [id number] Date of Birth: November 22, 1961

## 2021-12-13 ENCOUNTER — Other Ambulatory Visit: Payer: Self-pay

## 2021-12-13 ENCOUNTER — Ambulatory Visit: Payer: 59 | Admitting: Occupational Therapy

## 2021-12-13 DIAGNOSIS — I972 Postmastectomy lymphedema syndrome: Secondary | ICD-10-CM | POA: Diagnosis not present

## 2021-12-13 NOTE — Therapy (Signed)
[place] MAIN [facility] SERVICES [address] [place], [place], [address] Phone: [phone]   Fax:  [phone]  Occupational Therapy Treatment  Patient Details  Name: Marissa Phillips MRN: [id number] Date of Birth: [birth year] Referring Provider (OT): Thea Silversmith, [place]   Encounter Date: 12/13/2021   OT End of Session - 12/13/21 0911     Visit Number 2    Number of Visits 36    Date for OT Re-Evaluation 03/07/22    OT Start Time 0900    OT Stop Time 1000    OT Time Calculation (min) 60 min    Activity Tolerance Patient tolerated treatment well;No increased pain    Behavior During Therapy WFL for tasks assessed/performed             Past Medical History:  Diagnosis Date   Asthma    Breast cancer ([place]) 2005   Cancer [facility])    Breast- Right-2005   Osteopenia    Personal history of chemotherapy    Personal history of radiation therapy    PONV (postoperative nausea and vomiting)     Past Surgical History:  Procedure Laterality Date   BREAST BIOPSY Right 2005   +   BREAST BIOPSY Right 03/01/2020   [place] bx of mass at 2:00 heart marker, path pending   BREAST BIOPSY Left 03/01/2020   [place] bx mass at 3:00, coil marker, path pending   BREAST BIOPSY Right 03/01/2020   [place] bx of right axilla, no marker placed, path pending   BREAST LUMPECTOMY Right 2005   BREAST LUMPECTOMY WITH NEEDLE LOCALIZATION AND AXILLARY SENTINEL LYMPH NODE BX Right 2005   BREAST LUMPECTOMY WITH RADIOACTIVE SEED AND SENTINEL LYMPH NODE BIOPSY Bilateral 04/14/2020   Procedure: BILATERAL RADIOACTIVE SEED GUIDED BREAST LUMPECTOMIES, BILATERAL AXILLARY SENTINEL LYMPH NODE BIOPSIES, WITH BLUE DYE INJECTION RIGHT BREAST;  Surgeon: Alphonsa Overall, MD;  Location: [place];  Service: General;  Laterality: Bilateral;   BREAST SURGERY     COLONOSCOPY WITH PROPOFOL N/A 04/02/2017   Procedure: COLONOSCOPY WITH PROPOFOL;  Surgeon: Jonathon Bellows, MD;  Location: [facility] ENDOSCOPY;   Service: Endoscopy;  Laterality: N/A;   ESOPHAGOGASTRODUODENOSCOPY (EGD) WITH PROPOFOL N/A 04/02/2017   Procedure: ESOPHAGOGASTRODUODENOSCOPY (EGD) WITH PROPOFOL;  Surgeon: Jonathon Bellows, MD;  Location: [facility] ENDOSCOPY;  Service: Endoscopy;  Laterality: N/A;   TONSILLECTOMY  ~2000    There were no vitals filed for this visit.   Subjective Assessment - 12/13/21 0912     Subjective  Pt presents for OT vist 2/36 to address BUE/BUE post mastectomy lymphefdema. )t denies LE related pain today. Pt has no new concerns since initial evaluation last week.    Pertinent History S/p ER+ R br ca in 2005 w/ XRT and hormone therapy. SLNB? # of LN?Marland Kitchen Dx w B br ca 8/2021S/P B lumpectomies 04/14/20. S/P partial R breast XRT and and full br XRT on L. S/p SLND. # LN?. Pt reports seroma L axilla s/p 2021 sx with infection. Arimadex for 5 years and currently on Aromasin with good tolerance. Reported symptoms of AWS have resolved.    Limitations chronmic breast swelling and associated pain/discomfort. Impaired L shoulder internal rotation-pain at end range when dressing; difficulty sleeping;    Repetition Increases Symptoms    Special Tests Intake FOTO score :92/100    Patient Stated Goals reduce breast swelling and pain. limit infection risk    Pain Onset --   concurrent w/ XRT in 2021  LYMPHEDEMA/ONCOLOGY QUESTIONNAIRE - 12/13/21 1610       Lymphedema Assessments   Lymphedema Assessments Upper extremities      Right Upper Extremity Lymphedema   Other LUE (dominant and Rx) volume measures 2799.7 ml from B-G.    Other LLimb volume differential (LVD) measures 0.67%, L>R, which is WNL.      Left Upper Extremity Lymphedema   Other RUE volume measures 2781.0 ml. ml from B-G.                     OT Treatments/Exercises (OP) - 12/13/21 1259       ADLs   ADL Education Given Yes      Manual Therapy   Manual Therapy Edema management;Manual Lymphatic Drainage (MLD)    Edema  Management Initial BUE and trunkal measurements    Manual Lymphatic Drainage (MLD) Intro level MLD combined with teaching to [place] focusing on opening neck sequence and "hand washing" sequence                    OT Education - 12/13/21 1302     Education Details Continued Pt/ CG edu for lymphedema self care  and home program throughout session. Topics include multilayer, gradient compression wrapping, simple self-MLD, therapeutic lymphatic pumping exercises, skin/nail care, risk reduction factors and LE precautions, compression garments/recommendations and wear and care schedule and compression garment donning / doffing using assistive devices. All questions answered to the Pt's satisfaction, and Pt demonstrates understanding by report.    Person(s) Educated Patient    Methods Explanation;Demonstration;Handout    Comprehension Verbalized understanding;Returned demonstration                 OT Long Term Goals - 12/08/21 0953       OT LONG TERM GOAL #1   Title Given this patients Intake score of 92/100 on the functional outcomes FOTO tool, patient will experience an increase in function of 5 points to improve basic and instrumental ADLs performance, including lymphedema self-care.    Baseline 92/100    Time 12    Period Weeks    Status New    Target Date 03/07/22      OT LONG TERM GOAL #2   Title Pt will demonstrate understanding of lymphedema prevention strategies by identifying signs and symptoms of cellulitis infection and by naming/ discussing 3 prevention strategies using printed reference (modified assistance) to reduce risk of progression and to limit infection risk.    Baseline Max A    Time 4    Period Days    Status New    Target Date --   4th OT Rx visit     OT LONG TERM GOAL #3   Title Pt will achieve at least a 10% volume reduction in the L breast to return it to more typical  size and shape,  to limit lymphedema progression and to limit infection  risk.    Baseline Measurements TBA at OT Rx visit 1    Time 12    Period Weeks    Status New    Target Date 03/07/22      OT LONG TERM GOAL #5   Title Pt will achieve and sustain a least 85% compliance with all 4 LE self-care home program components throughout Intensive Phase CDT, including modified simple self-MLD, daily skin inspection and care, lymphatic pumping the ex and compression to sustain clinical gains made in CDT and to limit lymphedema progression and further functional decline.  Baseline Max A    Time 12    Period Weeks    Status New    Target Date 03/07/22      Long Term Additional Goals   Additional Long Term Goals Yes      OT LONG TERM GOAL #6   Title Pt will be able to perform all lymphedema self-care home program components using correct techniques with extra time and assistive devices PRN (modified independence), including simple self MLD, lymphatic pumping exercise, don and doff appropriate compression garments/ devices and perform daily skin care regime to limit lymphedema progression and infection risk.    Baseline Max A    Time 12    Period Weeks    Status New    Target Date 03/07/22                   Plan - 12/13/21 1613     Clinical Impression Statement Initial comparative limb volumetrics for BUE reveal Limb volume differential (LVD) measures 0.67%, L>R, which is WNL. Arm swelling is absent bilaterally. We also measured initial chest / trunk circumferences at acxilla, largest chest and under breast fold. Provises limited MLD to LUR/ LUQ qith Pt edu for lymphatic anatomy and lymphatic function. Cont as per POC. No arm sleeve needed unless Pt wants prophylactic garment. Focus compression on breast and lateral trunk.    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;Decreased knowledge of precautions;Decreased knowledge of use of DME;IADL;Skin integrity;Pain;Edema;ROM    Rehab Potential Good    Clinical Decision Making Limited treatment options, no task modification necessary    Comorbidities Affecting Occupational Performance: May have comorbidities impacting occupational performance    Comorbidities impacting occupational performance description: hx L axillary seroma st surgical site with cellulitis    Modification or Assistance to Complete Evaluation  No modification of tasks or assist necessary to complete eval    OT Frequency 2x / week    OT Duration 12 weeks   and PRN   OT Treatment/Interventions Self-care/ADL training;Therapeutic exercise;DME and/or AE instruction;Other (comment);Manual Therapy;Scar mobilization;Coping strategies training;Patient/family education;Therapeutic activities;Manual lymph drainage   breast and trunk compression-alternative   Plan Complete Decongestive Therapy to include MLD, skin care, therapeutic exercise, compression. Consider fitting Pt with compression bra, convoluted foam insert for HOS to reduce fibrosis. Consider kinesiotape, Consider advanced sequential pneumatic compression device (Flexitouch),    Consulted and Agree with Plan of Care Patient             Patient will benefit from skilled therapeutic intervention in order to improve the following deficits and impairments:   Body Structure / Function / Physical Skills: UE functional use, Decreased knowledge of precautions, Decreased knowledge of use of DME, IADL, Skin integrity, Pain, Edema, ROM       Visit Diagnosis: Post-mastectomy lymphedema syndrome    Problem List Patient Active Problem List   Diagnosis Date Noted   Goals of care, counseling/discussion 03/10/2020   Bilateral malignant neoplasm of breast in  female [facility]) 03/10/2020   Hx of adenomatous colonic polyps 04/29/2018   Hyperlipidemia, mild 01/02/2016   Anemia 07/06/2014   Asthma 07/06/2014   Environmental allergies 07/06/2014   GERD (gastroesophageal reflux disease) 07/06/2014   History of recurrent UTIs 07/06/2014  Osteoporosis 07/06/2014   Screening for cervical cancer 09/25/2011   History of breast cancer 09/25/2011   Andrey Spearman, MS, OTR/L, [facility] 12/13/21 4:25 PM   [place] MAIN [facility] SERVICES [address] [place], [place], [address] Phone: [phone]   Fax:  [phone]  Name: Marissa Phillips MRN: [id number] Date of Birth: 1961-01-05

## 2021-12-19 ENCOUNTER — Other Ambulatory Visit: Payer: Self-pay

## 2021-12-19 MED FILL — Trazodone HCl Tab 50 MG: ORAL | 30 days supply | Qty: 30 | Fill #5 | Status: AC

## 2021-12-20 ENCOUNTER — Other Ambulatory Visit: Payer: Self-pay

## 2021-12-20 ENCOUNTER — Ambulatory Visit: Payer: 59 | Admitting: Occupational Therapy

## 2021-12-20 DIAGNOSIS — I972 Postmastectomy lymphedema syndrome: Secondary | ICD-10-CM | POA: Diagnosis not present

## 2021-12-20 NOTE — Therapy (Signed)
[place] MAIN [facility] SERVICES [address] [place], [place], [address] Phone: [phone]   Fax:  [phone]  Occupational Therapy Treatment  Patient Details  Name: Marissa Phillips MRN: [id number] Date of Birth: 16-May-1961 Referring Provider (OT): Thea Silversmith, [place]   Encounter Date: 12/20/2021   OT End of Session - 12/20/21 1214     Visit Number 3    Number of Visits 36    Date for OT Re-Evaluation 03/07/22    OT Start Time 0905    OT Stop Time 1005    OT Time Calculation (min) 60 min    Activity Tolerance Patient tolerated treatment well;No increased pain    Behavior During Therapy WFL for tasks assessed/performed             Past Medical History:  Diagnosis Date   Asthma    Breast cancer ([place]) 2005   Cancer [facility])    Breast- Right-2005   Osteopenia    Personal history of chemotherapy    Personal history of radiation therapy    PONV (postoperative nausea and vomiting)     Past Surgical History:  Procedure Laterality Date   BREAST BIOPSY Right 2005   +   BREAST BIOPSY Right 03/01/2020   [place] bx of mass at 2:00 heart marker, path pending   BREAST BIOPSY Left 03/01/2020   [place] bx mass at 3:00, coil marker, path pending   BREAST BIOPSY Right 03/01/2020   [place] bx of right axilla, no marker placed, path pending   BREAST LUMPECTOMY Right 2005   BREAST LUMPECTOMY WITH NEEDLE LOCALIZATION AND AXILLARY SENTINEL LYMPH NODE BX Right 2005   BREAST LUMPECTOMY WITH RADIOACTIVE SEED AND SENTINEL LYMPH NODE BIOPSY Bilateral 04/14/2020   Procedure: BILATERAL RADIOACTIVE SEED GUIDED BREAST LUMPECTOMIES, BILATERAL AXILLARY SENTINEL LYMPH NODE BIOPSIES, WITH BLUE DYE INJECTION RIGHT BREAST;  Surgeon: Alphonsa Overall, MD;  Location: [place];  Service: General;  Laterality: Bilateral;   BREAST SURGERY     COLONOSCOPY WITH PROPOFOL N/A 04/02/2017   Procedure: COLONOSCOPY WITH PROPOFOL;  Surgeon: Jonathon Bellows, MD;  Location: [facility] ENDOSCOPY;   Service: Endoscopy;  Laterality: N/A;   ESOPHAGOGASTRODUODENOSCOPY (EGD) WITH PROPOFOL N/A 04/02/2017   Procedure: ESOPHAGOGASTRODUODENOSCOPY (EGD) WITH PROPOFOL;  Surgeon: Jonathon Bellows, MD;  Location: [facility] ENDOSCOPY;  Service: Endoscopy;  Laterality: N/A;   TONSILLECTOMY  ~2000    There were no vitals filed for this visit.   Subjective Assessment - 12/20/21 1216     Subjective  Marissa Phillips presents for OT vist 3/36 to address BUE/BUE post mastectomy lymphefdema.  OT and Pt discussed compression bra options via email during visit interval. L breast/ upper quadrant pain/ discomfort is unchanged since initial evaluation.    Pertinent History S/p ER+ R br ca in 2005 w/ XRT and hormone therapy. SLNB? # of LN?Marland Kitchen Dx w B br ca 8/2021S/P B lumpectomies 04/14/20. S/P partial R breast XRT and and full br XRT on L. S/p SLND. # LN?. Pt reports seroma L axilla s/p 2021 sx with infection. Arimadex for 5 years and currently on Aromasin with good tolerance. Reported symptoms of AWS have resolved.    Limitations chronmic breast swelling and associated pain/discomfort. Impaired L shoulder internal rotation-pain at end range when dressing; difficulty sleeping;    Repetition Increases Symptoms    Special Tests Intake FOTO score :92/100    Patient Stated Goals reduce breast swelling and pain. limit infection risk    Currently in Pain? Yes  Pain Location Breast    Pain Orientation Left    Pain Onset --   concurrent w/ XRT in 2021                         OT Treatments/Exercises (OP) - 12/20/21 1313       ADLs   ADL Education Given Yes      Manual Therapy   Manual Therapy Edema management;Manual Lymphatic Drainage (MLD)    Manual Lymphatic Drainage (MLD) MLD LUQ and L breast utilizing short neck sequence, subclavicular  plexus, axillary-inguinal anastamosis, intercostal pathway.                    OT Education - 12/20/21 1219     Education Details Brief introduction to simple  self MLD-J stroke. Cont discussion re compresson bra optons and discussed purpose of convoluted foam post  lumpectomy pad to reduce lymphedema -related fibrosis.Provided handout for simple self MLD for review in prep for self-care edu    Person(s) Educated Patient    Methods Explanation;Demonstration;Handout    Comprehension Verbalized understanding;Returned demonstration                 OT Long Term Goals - 12/08/21 0953       OT LONG TERM GOAL #1   Title Given this patients Intake score of 92/100 on the functional outcomes FOTO tool, patient will experience an increase in function of 5 points to improve basic and instrumental ADLs performance, including lymphedema self-care.    Baseline 92/100    Time 12    Period Weeks    Status New    Target Date 03/07/22      OT LONG TERM GOAL #2   Title Pt will demonstrate understanding of lymphedema prevention strategies by identifying signs and symptoms of cellulitis infection and by naming/ discussing 3 prevention strategies using printed reference (modified assistance) to reduce risk of progression and to limit infection risk.    Baseline Max A    Time 4    Period Days    Status New    Target Date --   4th OT Rx visit     OT LONG TERM GOAL #3   Title Pt will achieve at least a 10% volume reduction in the L breast to return it to more typical  size and shape,  to limit lymphedema progression and to limit infection risk.    Baseline Measurements TBA at OT Rx visit 1    Time 12    Period Weeks    Status New    Target Date 03/07/22      OT LONG TERM GOAL #5   Title Pt will achieve and sustain a least 85% compliance with all 4 LE self-care home program components throughout Intensive Phase CDT, including modified simple self-MLD, daily skin inspection and care, lymphatic pumping the ex and compression to sustain clinical gains made in CDT and to limit lymphedema progression and further functional decline.    Baseline Max A    Time 12     Period Weeks    Status New    Target Date 03/07/22      Long Term Additional Goals   Additional Long Term Goals Yes      OT LONG TERM GOAL #6   Title Pt will be able to perform all lymphedema self-care home program components using correct techniques with extra time and assistive devices PRN (modified independence), including simple self MLD,  lymphatic pumping exercise, don and doff appropriate compression garments/ devices and perform daily skin care regime to limit lymphedema progression and infection risk.    Baseline Max A    Time 12    Period Weeks    Status New    Target Date 03/07/22                   Plan - 12/20/21 1223     Clinical Impression Statement Completed anatomical measurements to determine Bellisse compression bra band and cup size.According to measurements Pt will need a 44 band size and DD/E cup size to accomodate  Jovi post-lumpectomy pads. bilaterally. Loaned a couple of samples of compression camisoles and a Wear Ease compression bra for exploration at home. Pt will return next session and we'll assess fit together. We want a garment that fits closely , but comfortably into axilla with compression to the breast and lateral trunk.Commenced into level teaching of simple self MLD during manual therapy. Pt mastetred J stroke after skilled teaching. Cont as per POC.    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;Decreased knowledge of precautions;Decreased knowledge of use of DME;IADL;Skin integrity;Pain;Edema;ROM    Rehab Potential Good    Clinical Decision Making Limited treatment options, no task modification necessary    Comorbidities  Affecting Occupational Performance: May have comorbidities impacting occupational performance    Comorbidities impacting occupational performance description: hx L axillary seroma st surgical site with cellulitis    Modification or Assistance to Complete Evaluation  No modification of tasks or assist necessary to complete eval    OT Frequency 2x / week    OT Duration 12 weeks   and PRN   OT Treatment/Interventions Self-care/ADL training;Therapeutic exercise;DME and/or AE instruction;Other (comment);Manual Therapy;Scar mobilization;Coping strategies training;Patient/family education;Therapeutic activities;Manual lymph drainage   breast and trunk compression-alternative   Plan Complete Decongestive Therapy to include MLD, skin care, therapeutic exercise, compression. Consider fitting Pt with compression bra, convoluted foam insert for HOS to reduce fibrosis. Consider kinesiotape, Consider advanced sequential pneumatic compression device (Flexitouch),    Consulted and Agree with Plan of Care Patient             Patient will benefit from skilled therapeutic intervention in order to improve the following deficits and impairments:   Body Structure / Function / Physical Skills: UE functional use, Decreased knowledge of precautions, Decreased knowledge of use of DME, IADL, Skin integrity, Pain, Edema, ROM       Visit Diagnosis: Post-mastectomy lymphedema syndrome    Problem List Patient Active Problem List   Diagnosis Date Noted   Goals of care, counseling/discussion 03/10/2020   Bilateral malignant neoplasm of breast in female [facility]) 03/10/2020   Hx of adenomatous colonic polyps 04/29/2018   Hyperlipidemia, mild 01/02/2016   Anemia 07/06/2014   Asthma 07/06/2014   Environmental allergies 07/06/2014   GERD (gastroesophageal reflux disease) 07/06/2014   History of recurrent UTIs 07/06/2014   Osteoporosis 07/06/2014   Screening for cervical cancer 09/25/2011   History of breast cancer  09/25/2011    Andrey Spearman, MS, OTR/L, [facility] 12/20/21 1:41 PM   [place] [address] [place], [place], [address] Phone: [phone]   Fax:  [phone]  Name: Marissa Phillips MRN: [id number] Date of Birth: 1961/08/20

## 2021-12-22 ENCOUNTER — Ambulatory Visit: Payer: 59 | Admitting: Occupational Therapy

## 2021-12-22 ENCOUNTER — Other Ambulatory Visit: Payer: Self-pay

## 2021-12-22 DIAGNOSIS — I972 Postmastectomy lymphedema syndrome: Secondary | ICD-10-CM

## 2021-12-22 NOTE — Therapy (Signed)
[place] [facility] SERVICES [address] [place], [place], [address] Phone: [phone]   Fax:  [phone]  Occupational Therapy Treatment  Patient Details  Name: Marissa Phillips MRN: [id number] Date of Birth: 04-Nov-1961 Referring Provider (OT): Marissa Phillips, [place]   Encounter Date: 12/22/2021   OT End of Session - 12/22/21 0816     Visit Number 4    Number of Visits 36    Date for OT Re-Evaluation 03/07/22    OT Start Time 0801    OT Stop Time 0910    OT Time Calculation (min) 69 min    Activity Tolerance Patient tolerated treatment well;No increased pain    Behavior During Therapy WFL for tasks assessed/performed             Past Medical History:  Diagnosis Date   Asthma    Breast cancer ([place]) 2005   Cancer [facility])    Breast- Right-2005   Osteopenia    Personal history of chemotherapy    Personal history of radiation therapy    PONV (postoperative nausea and vomiting)     Past Surgical History:  Procedure Laterality Date   BREAST BIOPSY Right 2005   +   BREAST BIOPSY Right 03/01/2020   [place] bx of mass at 2:00 heart marker, path pending   BREAST BIOPSY Left 03/01/2020   [place] bx mass at 3:00, coil marker, path pending   BREAST BIOPSY Right 03/01/2020   [place] bx of right axilla, no marker placed, path pending   BREAST LUMPECTOMY Right 2005   BREAST LUMPECTOMY WITH NEEDLE LOCALIZATION AND AXILLARY SENTINEL LYMPH NODE BX Right 2005   BREAST LUMPECTOMY WITH RADIOACTIVE SEED AND SENTINEL LYMPH NODE BIOPSY Bilateral 04/14/2020   Procedure: BILATERAL RADIOACTIVE SEED GUIDED BREAST LUMPECTOMIES, BILATERAL AXILLARY SENTINEL LYMPH NODE BIOPSIES, WITH BLUE DYE INJECTION RIGHT BREAST;  Surgeon: Alphonsa Overall, MD;  Location: [place];  Service: General;  Laterality: Bilateral;   BREAST SURGERY     COLONOSCOPY WITH PROPOFOL N/A 04/02/2017   Procedure: COLONOSCOPY WITH PROPOFOL;  Surgeon: Jonathon Bellows, MD;  Location: [facility] ENDOSCOPY;   Service: Endoscopy;  Laterality: N/A;   ESOPHAGOGASTRODUODENOSCOPY (EGD) WITH PROPOFOL N/A 04/02/2017   Procedure: ESOPHAGOGASTRODUODENOSCOPY (EGD) WITH PROPOFOL;  Surgeon: Jonathon Bellows, MD;  Location: [facility] ENDOSCOPY;  Service: Endoscopy;  Laterality: N/A;   TONSILLECTOMY  ~2000    There were no vitals filed for this visit.   Subjective Assessment - 12/22/21 1118     Subjective  Marissa Phillips presents for OT visit  4/36 to address BUE/BUQ lymphedema ( emphasis on LUQ). Pt reyurns loaned trunkal and chest compression garments stating none of them fit properly. Pt does not rate LE-related pain numerically today.    Pertinent History S/p ER+ R br ca in 2005 w/ XRT and hormone therapy. SLNB? # of LN?Marland Kitchen Dx w B br ca 8/2021S/P B lumpectomies 04/14/20. S/P partial R breast XRT and and full br XRT on L. S/p SLND. # LN?. Pt reports seroma L axilla s/p 2021 sx with infection. Arimadex for 5 years and currently on Aromasin with good tolerance. Reported symptoms of AWS have resolved.    Limitations chronmic breast swelling and associated pain/discomfort. Impaired L shoulder internal rotation-pain at end range when dressing; difficulty sleeping;    Repetition Increases Symptoms    Special Tests Intake FOTO score :92/100    Patient Stated Goals reduce breast swelling and pain. limit infection risk    Pain Onset --   concurrent  w/ XRT in 2021                         OT Treatments/Exercises (OP) - 12/22/21 1120       Manual Therapy   Manual Therapy Edema management;Manual Lymphatic Drainage (MLD)    Edema Management kinesiotape applied as outlined in CLINICAL IMPRESSION STATEMENT-Plan    Manual Lymphatic Drainage (MLD) MLD LUQ and L breast utilizing short neck sequence, subclavicular  plexus, axillary-inguinal anastamosis, intercostal pathway.                    OT Education - 12/22/21 1121     Education Details Pt edu re benefits of kinesiotape for lymphatic decongestion, scar  massage and fibrosis reduction. Reviewed removal techniques to limit skin irritation.    Person(s) Educated Patient    Methods Explanation;Demonstration;Handout    Comprehension Verbalized understanding;Returned demonstration                 OT Long Term Goals - 12/08/21 0953       OT LONG TERM GOAL #1   Title Given this patients Intake score of 92/100 on the functional outcomes FOTO tool, patient will experience an increase in function of 5 points to improve basic and instrumental ADLs performance, including lymphedema self-care.    Baseline 92/100    Time 12    Period Weeks    Status New    Target Date 03/07/22      OT LONG TERM GOAL #2   Title Pt will demonstrate understanding of lymphedema prevention strategies by identifying signs and symptoms of cellulitis infection and by naming/ discussing 3 prevention strategies using printed reference (modified assistance) to reduce risk of progression and to limit infection risk.    Baseline Max A    Time 4    Period Days    Status New    Target Date --   4th OT Rx visit     OT LONG TERM GOAL #3   Title Pt will achieve at least a 10% volume reduction in the L breast to return it to more typical  size and shape,  to limit lymphedema progression and to limit infection risk.    Baseline Measurements TBA at OT Rx visit 1    Time 12    Period Weeks    Status New    Target Date 03/07/22      OT LONG TERM GOAL #5   Title Pt will achieve and sustain a least 85% compliance with all 4 LE self-care home program components throughout Intensive Phase CDT, including modified simple self-MLD, daily skin inspection and care, lymphatic pumping the ex and compression to sustain clinical gains made in CDT and to limit lymphedema progression and further functional decline.    Baseline Max A    Time 12    Period Weeks    Status New    Target Date 03/07/22      Long Term Additional Goals   Additional Long Term Goals Yes      OT LONG TERM  GOAL #6   Title Pt will be able to perform all lymphedema self-care home program components using correct techniques with extra time and assistive devices PRN (modified independence), including simple self MLD, lymphatic pumping exercise, don and doff appropriate compression garments/ devices and perform daily skin care regime to limit lymphedema progression and infection risk.    Baseline Max A    Time 12  Period Weeks    Status New    Target Date 03/07/22                   Plan - 12/22/21 1111     Clinical Impression Statement Pt unable to fit either of the 2 loaned compression camisoles , or the sample compression bra. Pt returned garments today. Pt tolerated L upper quadrant MLD well. She noted mild point tenderness at distal ribs with intercostal technique. Applied four, 3-finger fan-cut Kinesiotape strips anchoring 2 strips on lateral trunk adjacent to axilla and extending along AI anastamosis towards navel to encourage alternative pathway  lymph drainage from breast. Anchored short piece at subclavicular region and extended to medial superior breast quadrant. Anchored final strip at contralateral spine and extended fan fingers around and over breast. Pt instructed on correct removal technique, and instruted to remove tape if she experiences any skin irritation or discomfort from tape. Continue to work on compression bra solution. Continue to use kinesiotape to increase effectiveness.    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;Decreased knowledge of precautions;Decreased knowledge of use of DME;IADL;Skin integrity;Pain;Edema;ROM    Rehab Potential Good     Clinical Decision Making Limited treatment options, no task modification necessary    Comorbidities Affecting Occupational Performance: May have comorbidities impacting occupational performance    Comorbidities impacting occupational performance description: hx L axillary seroma st surgical site with cellulitis    Modification or Assistance to Complete Evaluation  No modification of tasks or assist necessary to complete eval    OT Frequency 2x / week    OT Duration 12 weeks   and PRN   OT Treatment/Interventions Self-care/ADL training;Therapeutic exercise;DME and/or AE instruction;Other (comment);Manual Therapy;Scar mobilization;Coping strategies training;Patient/family education;Therapeutic activities;Manual lymph drainage   breast and trunk compression-alternative   Plan Complete Decongestive Therapy to include MLD, skin care, therapeutic exercise, compression. Consider fitting Pt with compression bra, convoluted foam insert for HOS to reduce fibrosis. Consider kinesiotape, Consider advanced sequential pneumatic compression device (Flexitouch),    Consulted and Agree with Plan of Care Patient             Patient will benefit from skilled therapeutic intervention in order to improve the following deficits and impairments:   Body Structure / Function / Physical Skills: UE functional use, Decreased knowledge of precautions, Decreased knowledge of use of DME, IADL, Skin integrity, Pain, Edema, ROM       Visit Diagnosis: Post-mastectomy lymphedema syndrome    Problem List Patient Active Problem List   Diagnosis Date Noted   Goals of care, counseling/discussion 03/10/2020   Bilateral malignant neoplasm of breast in female [facility]) 03/10/2020   Hx of adenomatous colonic polyps 04/29/2018   Hyperlipidemia, mild 01/02/2016   Anemia 07/06/2014   Asthma 07/06/2014   Environmental allergies 07/06/2014   GERD (gastroesophageal reflux disease) 07/06/2014   History of recurrent UTIs 07/06/2014    Osteoporosis 07/06/2014   Screening for cervical cancer 09/25/2011   History of breast cancer 09/25/2011   Andrey Spearman, MS, OTR/L, [facility] 12/22/21 11:23 AM    [facility] [address] [place], [place], [address] Phone: [phone]   Fax:  [phone]  Name: Marissa Phillips MRN:  [id number] Date of Birth: 1961/09/22

## 2021-12-25 ENCOUNTER — Other Ambulatory Visit: Payer: Self-pay

## 2021-12-25 ENCOUNTER — Inpatient Hospital Stay: Payer: 59 | Admitting: Oncology

## 2021-12-25 ENCOUNTER — Encounter: Payer: Self-pay | Admitting: Oncology

## 2021-12-25 ENCOUNTER — Other Ambulatory Visit: Payer: Self-pay | Admitting: *Deleted

## 2021-12-25 ENCOUNTER — Inpatient Hospital Stay: Payer: 59 | Attending: Oncology

## 2021-12-25 VITALS — BP 144/89 | HR 98 | Temp 97.5°F | Resp 16 | Ht 66.0 in | Wt 206.0 lb

## 2021-12-25 DIAGNOSIS — Z9221 Personal history of antineoplastic chemotherapy: Secondary | ICD-10-CM | POA: Insufficient documentation

## 2021-12-25 DIAGNOSIS — C50911 Malignant neoplasm of unspecified site of right female breast: Secondary | ICD-10-CM | POA: Insufficient documentation

## 2021-12-25 DIAGNOSIS — Z08 Encounter for follow-up examination after completed treatment for malignant neoplasm: Secondary | ICD-10-CM | POA: Diagnosis not present

## 2021-12-25 DIAGNOSIS — Z79899 Other long term (current) drug therapy: Secondary | ICD-10-CM | POA: Diagnosis not present

## 2021-12-25 DIAGNOSIS — Z79811 Long term (current) use of aromatase inhibitors: Secondary | ICD-10-CM | POA: Diagnosis not present

## 2021-12-25 DIAGNOSIS — C50912 Malignant neoplasm of unspecified site of left female breast: Secondary | ICD-10-CM | POA: Diagnosis not present

## 2021-12-25 DIAGNOSIS — Z853 Personal history of malignant neoplasm of breast: Secondary | ICD-10-CM

## 2021-12-25 DIAGNOSIS — M8580 Other specified disorders of bone density and structure, unspecified site: Secondary | ICD-10-CM | POA: Diagnosis not present

## 2021-12-25 DIAGNOSIS — Z170 Estrogen receptor positive status [ER+]: Secondary | ICD-10-CM | POA: Insufficient documentation

## 2021-12-25 DIAGNOSIS — Z923 Personal history of irradiation: Secondary | ICD-10-CM | POA: Insufficient documentation

## 2021-12-25 LAB — CBC WITH DIFFERENTIAL/PLATELET
Abs Immature Granulocytes: 0.01 10*3/uL (ref 0.00–0.07)
Basophils Absolute: 0 10*3/uL (ref 0.0–0.1)
Basophils Relative: 1 %
Eosinophils Absolute: 0.1 10*3/uL (ref 0.0–0.5)
Eosinophils Relative: 2 %
HCT: 43.2 % (ref 36.0–46.0)
Hemoglobin: 14.1 g/dL (ref 12.0–15.0)
Immature Granulocytes: 0 %
Lymphocytes Relative: 23 %
Lymphs Abs: 1.2 10*3/uL (ref 0.7–4.0)
MCH: 30 pg (ref 26.0–34.0)
MCHC: 32.6 g/dL (ref 30.0–36.0)
MCV: 91.9 fL (ref 80.0–100.0)
Monocytes Absolute: 0.6 10*3/uL (ref 0.1–1.0)
Monocytes Relative: 11 %
Neutro Abs: 3.5 10*3/uL (ref 1.7–7.7)
Neutrophils Relative %: 63 %
Platelets: 239 10*3/uL (ref 150–400)
RBC: 4.7 MIL/uL (ref 3.87–5.11)
RDW: 12.6 % (ref 11.5–15.5)
WBC: 5.5 10*3/uL (ref 4.0–10.5)
nRBC: 0 % (ref 0.0–0.2)

## 2021-12-25 LAB — COMPREHENSIVE METABOLIC PANEL
ALT: 29 U/L (ref 0–44)
AST: 28 U/L (ref 15–41)
Albumin: 4.3 g/dL (ref 3.5–5.0)
Alkaline Phosphatase: 96 U/L (ref 38–126)
Anion gap: 7 (ref 5–15)
BUN: 12 mg/dL (ref 6–20)
CO2: 28 mmol/L (ref 22–32)
Calcium: 9.6 mg/dL (ref 8.9–10.3)
Chloride: 102 mmol/L (ref 98–111)
Creatinine, Ser: 0.91 mg/dL (ref 0.44–1.00)
GFR, Estimated: >60 mL/min (ref >60)
Glucose, Bld: 100 mg/dL — ABNORMAL HIGH (ref 70–99)
Potassium: 3.9 mmol/L (ref 3.5–5.1)
Sodium: 137 mmol/L (ref 135–145)
Total Bilirubin: 0.5 mg/dL (ref 0.3–1.2)
Total Protein: 7.8 g/dL (ref 6.5–8.1)

## 2021-12-25 MED ORDER — LETROZOLE 2.5 MG PO TABS
2.5000 mg | ORAL_TABLET | Freq: Every day | ORAL | 3 refills | Status: DC
  Filled 2021-12-25: qty 30, 30d supply, fill #0
  Filled 2022-01-21: qty 30, 30d supply, fill #1
  Filled 2022-02-25: qty 30, 30d supply, fill #2
  Filled 2022-03-28: qty 30, 30d supply, fill #3

## 2021-12-25 NOTE — Progress Notes (Signed)
Pt feels that she has had some lymphedema of left breast for a while. She started 2 weeks ago to get OT for it.

## 2021-12-25 NOTE — Progress Notes (Signed)
Hematology/Oncology Consult note [facility]  [phone] [phone]  Patient Care Team: Idelle Crouch, MD as PCP - General (Internal Medicine) Theodore Demark, RN as Oncology Nurse Navigator Noreene Filbert, MD as Radiation Oncologist (Radiation Oncology) Jeral Fruit, RN as Registered Nurse   Name of the patient: Marissa Phillips  [id number]  July 20, 1961   Date of visit: 12/25/21  Diagnosis- bilateral breast cancer ER/PR positive and HER-2/neu negative s/p bilateral lumpectomy    Chief complaint/ Reason for visit-routine follow-up of breast cancer  Heme/Onc history: Patient is a 61 year old female who recently underwent a bilateral screening mammogram on 07/13/2020 which showed possible mass in both the right and left breast.  This was followed by a diagnostic mammogram and ultrasound.  Right breast demonstrated 0.6 x 0.5 x 0.5 cm hypoechoic mass in the left breast demonstrated 0.5 x 0.5 x 0.4 cm hypoechoic mass.  There was an abnormal right axillary lymph node noted with cortical thickening.  All these areas were biopsied.  Right axillary lymph node was negative for malignancy.  Right breast mass showed invasive mammary carcinoma 6 mm grade 3.  Left breast mass showed invasive mammary carcinoma with lobular features 3 mm grade 2.  ER/PR and HER-2 status was pending at the time of my visit   Patient has a prior history of ER positive breast cancer back in 2005.  She is s/p surgery and radiation and hormone therapy at that time for 5 years   Bilateral breast MRI showed biopsy-proven malignancy in bilateral breasts indeterminate non-mass enhancement adjacent to prior right breast lumpectomy and enhancing focus in the central left breast.Biopsy of both the sites revealed fat necrosis of the right breast and fibrocystic changes in the left   Patient went for bilateral lumpectomy.  Right breast pathology showed 0.6 cm invasive ductal carcinoma  grade 2 with negative margins and left breast showed 1.2 cm invasive lobular carcinoma.  1 sentinel lymph node on the left side was negative for malignancy.  Both these tumors were ER positive and HER-2 negative.  PR negative.   Oncotype testing was sent on both right and left breast specimens and both came back with an Oncotype score of 24 which puts her at intermediate risk for age.  She would not benefit from adjuvant chemotherapy.  She completed adjuvant radiation therapy and started Aromasin in September 2021 patient had problems tolerating Aromasin and was switched to letrozole in January 2023    Interval history-patient has been off Aromasin for about 3 weeks now.  She was getting sudden random episodes of muscle spasms involving her back as well as abdominal wall.  Also reports worsening of her sinusitis symptoms while she was on Aromasin.  All the symptoms are much improved after she is off Aromasin.  She is currently under going occupational therapy treatments for lymphedema involving her left breast  ECOG PS- 1 Pain scale- 0   Review of systems- Review of Systems  Constitutional:  Positive for malaise/fatigue.  Musculoskeletal:  Positive for joint pain.     Allergies  Allergen Reactions   Celebrex [Celecoxib] Rash   Clindamycin Hcl Rash   Loratadine Other (See Comments)    fevers   Omeprazole Other (See Comments)    After 1 week of therapy dry mouth and mild nighttime urinary frequency -bothersome enough for her stop it.    Penicillins Rash    Slight redness and itching    Sulfa Antibiotics Rash  Past Medical History:  Diagnosis Date   Asthma    Breast cancer ([place]) 2005   Cancer [facility])    Breast- Right-2005   Osteopenia    Personal history of chemotherapy    Personal history of radiation therapy    PONV (postoperative nausea and vomiting)      Past Surgical History:  Procedure Laterality Date   BREAST BIOPSY Right 2005   +   BREAST BIOPSY Right 03/01/2020    [place] bx of mass at 2:00 heart marker, path pending   BREAST BIOPSY Left 03/01/2020   [place] bx mass at 3:00, coil marker, path pending   BREAST BIOPSY Right 03/01/2020   [place] bx of right axilla, no marker placed, path pending   BREAST LUMPECTOMY Right 2005   BREAST LUMPECTOMY WITH NEEDLE LOCALIZATION AND AXILLARY SENTINEL LYMPH NODE BX Right 2005   BREAST LUMPECTOMY WITH RADIOACTIVE SEED AND SENTINEL LYMPH NODE BIOPSY Bilateral 04/14/2020   Procedure: BILATERAL RADIOACTIVE SEED GUIDED BREAST LUMPECTOMIES, BILATERAL AXILLARY SENTINEL LYMPH NODE BIOPSIES, WITH BLUE DYE INJECTION RIGHT BREAST;  Surgeon: Alphonsa Overall, MD;  Location: [place];  Service: General;  Laterality: Bilateral;   BREAST SURGERY     COLONOSCOPY WITH PROPOFOL N/A 04/02/2017   Procedure: COLONOSCOPY WITH PROPOFOL;  Surgeon: Jonathon Bellows, MD;  Location: [facility] ENDOSCOPY;  Service: Endoscopy;  Laterality: N/A;   ESOPHAGOGASTRODUODENOSCOPY (EGD) WITH PROPOFOL N/A 04/02/2017   Procedure: ESOPHAGOGASTRODUODENOSCOPY (EGD) WITH PROPOFOL;  Surgeon: Jonathon Bellows, MD;  Location: [facility] ENDOSCOPY;  Service: Endoscopy;  Laterality: N/A;   TONSILLECTOMY  ~2000    Social History   Socioeconomic History   Marital status: Married    Spouse name: Not on file   Number of children: Not on file   Years of education: Not on file   Highest education level: Not on file  Occupational History   Not on file  Tobacco Use   Smoking status: Never   Smokeless tobacco: Never  Vaping Use   Vaping Use: Never used  Substance and Sexual Activity   Alcohol use: No   Drug use: No   Sexual activity: Not Currently  Other Topics Concern   Not on file  Social History Narrative   Not on file   Social Determinants of Health   Financial Resource Strain: Not on file  Food Insecurity: Not on file  Transportation Needs: Not on file  Physical Activity: Not on file  Stress: Not on file  Social Connections: Not on file  Intimate Partner Violence: Not on file     Family History  Problem Relation Age of Onset   Cancer Mother 2       Breast, now bilateral   Breast cancer Mother 40       right breast ca then left breast ca    Leukemia Mother    Heart attack Father    Hepatitis Father      Current Outpatient Medications:    acetaminophen (TYLENOL) 500 MG tablet, Take 500-1,000 mg by mouth 2 (two) times daily as needed (pain.)., Disp: , Rfl:    calcium-vitamin D (OSCAL WITH D) 500-200 MG-UNIT tablet, Take 1 tablet by mouth 2 (two) times daily., Disp: , Rfl:    cetirizine (ZYRTEC) 10 MG tablet, Take 10 mg by mouth daily as needed., Disp: , Rfl:    Cholecalciferol (DIALYVITE VITAMIN D 5000 PO), Take 5,000 Units by mouth daily., Disp: , Rfl:    famotidine (PEPCID) 20 MG tablet, Take 20 mg by mouth daily as needed for  heartburn or indigestion., Disp: , Rfl:    letrozole (FEMARA) 2.5 MG tablet, Take 1 tablet (2.5 mg total) by mouth daily., Disp: 30 tablet, Rfl: 3   melatonin 5 MG TABS, 5 mg at bedtime as needed., Disp: , Rfl:    naproxen sodium (ALEVE) 220 MG tablet, Take 220-440 mg by mouth daily as needed (pain.)., Disp: , Rfl:    traZODone (DESYREL) 50 MG tablet, TAKE 1 TABLET BY MOUTH NIGHTLY AS NEEDED FOR SLEEP, Disp: 30 tablet, Rfl: 11  Physical exam:  Vitals:   12/25/21 1112  BP: (!) 144/89  Pulse: 98  Resp: 16  Temp: (!) 97.5 F (36.4 C)  TempSrc: Tympanic  Weight: 206 lb (93.4 kg)  Height: 5' 6"  (1.676 m)   Physical Exam Constitutional:      General: She is not in acute distress. Cardiovascular:     Rate and Rhythm: Normal rate and regular rhythm.     Heart sounds: Normal heart sounds.  Pulmonary:     Effort: Pulmonary effort is normal.  Abdominal:     General: Bowel sounds are normal.     Palpations: Abdomen is soft.  Skin:    General: Skin is warm and dry.  Neurological:     Mental Status: She is alert and oriented to person, place, and time.  Breast exam: Patient is s/p bilateral lumpectomy with a well-healed  surgical scar.  No palpable bilateral axillary adenopathy.  Patient has kinesiology tapes over her left breast.  CMP Latest Ref Rng & Units 12/25/2021  Glucose 70 - 99 mg/dL 100(H)  BUN 6 - 20 mg/dL 12  Creatinine 0.44 - 1.00 mg/dL 0.91  Sodium 135 - 145 mmol/L 137  Potassium 3.5 - 5.1 mmol/L 3.9  Chloride 98 - 111 mmol/L 102  CO2 22 - 32 mmol/L 28  Calcium 8.9 - 10.3 mg/dL 9.6  Total Protein 6.5 - 8.1 g/dL 7.8  Total Bilirubin 0.3 - 1.2 mg/dL 0.5  Alkaline Phos 38 - 126 U/L 96  AST 15 - 41 U/L 28  ALT 0 - 44 U/L 29   CBC Latest Ref Rng & Units 12/25/2021  WBC 4.0 - 10.5 K/uL 5.5  Hemoglobin 12.0 - 15.0 g/dL 14.1  Hematocrit 36.0 - 46.0 % 43.2  Platelets 150 - 400 K/uL 239    Assessment and plan- Patient is a 61 y.o. female prior history of right breast cancer now with bilateral breast cancer stage I ER positive and HER-2 negative s/p bilateral lumpectomy and left sentinel lymph node biopsy.  She did not require adjuvant chemotherapy based on her Oncotype score.  She completed adjuvant radiation therapy.  She is here for routine follow-up  Clinically patient is doing well with no concerning signs and symptoms of recurrence based on today's exam.  She will be due for mammogram in March 2023 as well as bone density in July 2023 which we will schedule.  Patient could not tolerate Aromasin due to muscle spasms.  She is agreeable to trying letrozole at this time and we will send her the new prescription.  She will let us know if she has any untoward side effects with this medication.  Patient does have evidence of left breast lymphedema for which she is undergoing occupational therapy treatments  I will see her back in 6 months with bone density scan prior   Visit Diagnosis 1. Encounter for follow-up surveillance of breast cancer      Dr. Randa Evens, MD, MPH [place] at [facility] [phone] 12/25/2021 1:31 PM

## 2021-12-26 ENCOUNTER — Ambulatory Visit: Payer: 59 | Admitting: Occupational Therapy

## 2021-12-26 ENCOUNTER — Other Ambulatory Visit: Payer: Self-pay

## 2021-12-26 DIAGNOSIS — I972 Postmastectomy lymphedema syndrome: Secondary | ICD-10-CM

## 2021-12-26 NOTE — Therapy (Signed)
[place] MAIN [facility] SERVICES [address] [place], [place], [address] Phone: [phone]   Fax:  [phone]  Occupational Therapy Treatment  Patient Details  Name: Marissa Phillips MRN: [id number] Date of Birth: 30-Oct-1961 Referring Provider (OT): Thea Silversmith, [place]   Encounter Date: 12/26/2021   OT End of Session - 12/26/21 1549     Visit Number 5    Number of Visits 36    Date for OT Re-Evaluation 03/07/22    OT Start Time 0204    OT Stop Time 0304    OT Time Calculation (min) 60 min    Activity Tolerance Patient tolerated treatment well;No increased pain    Behavior During Therapy WFL for tasks assessed/performed             Past Medical History:  Diagnosis Date   Asthma    Breast cancer ([place]) 2005   Cancer [facility])    Breast- Right-2005   Osteopenia    Personal history of chemotherapy    Personal history of radiation therapy    PONV (postoperative nausea and vomiting)     Past Surgical History:  Procedure Laterality Date   BREAST BIOPSY Right 2005   +   BREAST BIOPSY Right 03/01/2020   [place] bx of mass at 2:00 heart marker, path pending   BREAST BIOPSY Left 03/01/2020   [place] bx mass at 3:00, coil marker, path pending   BREAST BIOPSY Right 03/01/2020   [place] bx of right axilla, no marker placed, path pending   BREAST LUMPECTOMY Right 2005   BREAST LUMPECTOMY WITH NEEDLE LOCALIZATION AND AXILLARY SENTINEL LYMPH NODE BX Right 2005   BREAST LUMPECTOMY WITH RADIOACTIVE SEED AND SENTINEL LYMPH NODE BIOPSY Bilateral 04/14/2020   Procedure: BILATERAL RADIOACTIVE SEED GUIDED BREAST LUMPECTOMIES, BILATERAL AXILLARY SENTINEL LYMPH NODE BIOPSIES, WITH BLUE DYE INJECTION RIGHT BREAST;  Surgeon: Alphonsa Overall, MD;  Location: [place];  Service: General;  Laterality: Bilateral;   BREAST SURGERY     COLONOSCOPY WITH PROPOFOL N/A 04/02/2017   Procedure: COLONOSCOPY WITH PROPOFOL;  Surgeon: Jonathon Bellows, MD;  Location: [facility] ENDOSCOPY;   Service: Endoscopy;  Laterality: N/A;   ESOPHAGOGASTRODUODENOSCOPY (EGD) WITH PROPOFOL N/A 04/02/2017   Procedure: ESOPHAGOGASTRODUODENOSCOPY (EGD) WITH PROPOFOL;  Surgeon: Jonathon Bellows, MD;  Location: [facility] ENDOSCOPY;  Service: Endoscopy;  Laterality: N/A;   TONSILLECTOMY  ~2000    There were no vitals filed for this visit.   Subjective Assessment - 12/26/21 1554     Subjective  Marissa Phillips presents for OT visit  4/36 to address BUE/BUQ lymphedema ( emphasis on LUQ). Pt reports good tolerance for kinesio tape. She denies LE realted pain today.    Pertinent History S/p ER+ R br ca in 2005 w/ XRT and hormone therapy. SLNB? # of LN?Marland Kitchen Dx w B br ca 8/2021S/P B lumpectomies 04/14/20. S/P partial R breast XRT and and full br XRT on L. S/p SLND. # LN?. Pt reports seroma L axilla s/p 2021 sx with infection. Arimadex for 5 years and currently on Aromasin with good tolerance. Reported symptoms of AWS have resolved.    Limitations chronmic breast swelling and associated pain/discomfort. Impaired L shoulder internal rotation-pain at end range when dressing; difficulty sleeping;    Repetition Increases Symptoms    Special Tests Intake FOTO score :92/100    Patient Stated Goals reduce breast swelling and pain. limit infection risk    Pain Onset --   concurrent w/ XRT in 2021  OT Treatments/Exercises (OP) - 12/26/21 1555       ADLs   ADL Education Given Yes      Manual Therapy   Manual Therapy Edema management;Manual Lymphatic Drainage (MLD)    Manual Lymphatic Drainage (MLD) MLD LUQ and L breast utilizing short neck sequence, subclavicular  plexus, axillary-inguinal anastamosis, intercostal pathway.                    OT Education - 12/26/21 1555     Education Details Continued Pt/ CG edu for lymphedema self care  and home program throughout session. Topics include multilayer, gradient compression wrapping, simple self-MLD, therapeutic lymphatic pumping  exercises, skin/nail care, risk reduction factors and LE precautions, compression garments/recommendations and wear and care schedule and compression garment donning / doffing using assistive devices. All questions answered to the Pt's satisfaction, and Pt demonstrates understanding by report.    Person(s) Educated Patient    Methods Explanation;Demonstration;Handout    Comprehension Verbalized understanding;Returned demonstration                 OT Long Term Goals - 12/08/21 0953       OT LONG TERM GOAL #1   Title Given this patients Intake score of 92/100 on the functional outcomes FOTO tool, patient will experience an increase in function of 5 points to improve basic and instrumental ADLs performance, including lymphedema self-care.    Baseline 92/100    Time 12    Period Weeks    Status New    Target Date 03/07/22      OT LONG TERM GOAL #2   Title Pt will demonstrate understanding of lymphedema prevention strategies by identifying signs and symptoms of cellulitis infection and by naming/ discussing 3 prevention strategies using printed reference (modified assistance) to reduce risk of progression and to limit infection risk.    Baseline Max A    Time 4    Period Days    Status New    Target Date --   4th OT Rx visit     OT LONG TERM GOAL #3   Title Pt will achieve at least a 10% volume reduction in the L breast to return it to more typical  size and shape,  to limit lymphedema progression and to limit infection risk.    Baseline Measurements TBA at OT Rx visit 1    Time 12    Period Weeks    Status New    Target Date 03/07/22      OT LONG TERM GOAL #5   Title Pt will achieve and sustain a least 85% compliance with all 4 LE self-care home program components throughout Intensive Phase CDT, including modified simple self-MLD, daily skin inspection and care, lymphatic pumping the ex and compression to sustain clinical gains made in CDT and to limit lymphedema progression  and further functional decline.    Baseline Max A    Time 12    Period Weeks    Status New    Target Date 03/07/22      Long Term Additional Goals   Additional Long Term Goals Yes      OT LONG TERM GOAL #6   Title Pt will be able to perform all lymphedema self-care home program components using correct techniques with extra time and assistive devices PRN (modified independence), including simple self MLD, lymphatic pumping exercise, don and doff appropriate compression garments/ devices and perform daily skin care regime to limit lymphedema progression and infection risk.  Baseline Max A    Time 12    Period Weeks    Status New    Target Date 03/07/22                   Plan - 12/26/21 1550     Clinical Impression Statement Pt tolerated kinesiotape to L breast and lateral trunk as established at previous visit. Tape left positive indentations at lateral trunk adjacent to axilla and on lateral aspect of L breast after removed. Despite reported tenderness at surgical site, no skin irritation is noted when tape is removed. We did not reapply tape today to give skin a rest from constant passive stretch, and to gove Pt a chance to wash off adhesive. AProvided LUQ, L breast MLD today with good tolerance.Pt continues to shop for OTS compression bra. Cont as per POC.    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;Decreased knowledge of precautions;Decreased knowledge of use of DME;IADL;Skin integrity;Pain;Edema;ROM    Rehab Potential Good    Clinical Decision Making Limited treatment options, no task modification necessary    Comorbidities Affecting Occupational  Performance: May have comorbidities impacting occupational performance    Comorbidities impacting occupational performance description: hx L axillary seroma st surgical site with cellulitis    Modification or Assistance to Complete Evaluation  No modification of tasks or assist necessary to complete eval    OT Frequency 2x / week    OT Duration 12 weeks   and PRN   OT Treatment/Interventions Self-care/ADL training;Therapeutic exercise;DME and/or AE instruction;Other (comment);Manual Therapy;Scar mobilization;Coping strategies training;Patient/family education;Therapeutic activities;Manual lymph drainage   breast and trunk compression-alternative   Plan Complete Decongestive Therapy to include MLD, skin care, therapeutic exercise, compression. Consider fitting Pt with compression bra, convoluted foam insert for HOS to reduce fibrosis. Consider kinesiotape, Consider advanced sequential pneumatic compression device (Flexitouch),    Consulted and Agree with Plan of Care Patient             Patient will benefit from skilled therapeutic intervention in order to improve the following deficits and impairments:   Body Structure / Function / Physical Skills: UE functional use, Decreased knowledge of precautions, Decreased knowledge of use of DME, IADL, Skin integrity, Pain, Edema, ROM       Visit Diagnosis: Post-mastectomy lymphedema syndrome    Problem List Patient Active Problem List   Diagnosis Date Noted   Goals of care, counseling/discussion 03/10/2020   Bilateral malignant neoplasm of breast in female [facility]) 03/10/2020   Hx of adenomatous colonic polyps 04/29/2018   Hyperlipidemia, mild 01/02/2016   Anemia 07/06/2014   Asthma 07/06/2014   Environmental allergies 07/06/2014   GERD (gastroesophageal reflux disease) 07/06/2014   History of recurrent UTIs 07/06/2014   Osteoporosis 07/06/2014   Screening for cervical cancer 09/25/2011   History of breast cancer 09/25/2011    Andrey Spearman, MS, OTR/L, [facility] 12/26/21 3:57 PM   [place] MAIN [facility] SERVICES [address] [place], [place], [address] Phone: [phone]   Fax:  [phone]  Name: Marissa Phillips MRN: [id number] Date of Birth: 20-Feb-1961

## 2021-12-27 ENCOUNTER — Encounter: Payer: Self-pay | Admitting: Oncology

## 2021-12-28 ENCOUNTER — Ambulatory Visit: Payer: 59 | Attending: General Surgery | Admitting: Occupational Therapy

## 2021-12-28 ENCOUNTER — Other Ambulatory Visit: Payer: Self-pay

## 2021-12-28 DIAGNOSIS — I972 Postmastectomy lymphedema syndrome: Secondary | ICD-10-CM | POA: Diagnosis not present

## 2021-12-28 NOTE — Therapy (Signed)
[place] MAIN [facility] SERVICES [address] [place], [place], [address] Phone: [phone]   Fax:  [phone]  Occupational Therapy Treatment  Patient Details  Name: Marissa Phillips MRN: [id number] Date of Birth: 11-16-1961 Referring Provider (OT): Thea Silversmith, [place]   Encounter Date: 12/28/2021   OT End of Session - 12/28/21 0813     Visit Number 6    Number of Visits 36    Date for OT Re-Evaluation 03/07/22    OT Start Time 0803    OT Stop Time 0903    OT Time Calculation (min) 60 min    Activity Tolerance Patient tolerated treatment well;No increased pain    Behavior During Therapy WFL for tasks assessed/performed             Past Medical History:  Diagnosis Date   Asthma    Breast cancer ([place]) 2005   Cancer [facility])    Breast- Right-2005   Osteopenia    Personal history of chemotherapy    Personal history of radiation therapy    PONV (postoperative nausea and vomiting)     Past Surgical History:  Procedure Laterality Date   BREAST BIOPSY Right 2005   +   BREAST BIOPSY Right 03/01/2020   [place] bx of mass at 2:00 heart marker, path pending   BREAST BIOPSY Left 03/01/2020   [place] bx mass at 3:00, coil marker, path pending   BREAST BIOPSY Right 03/01/2020   [place] bx of right axilla, no marker placed, path pending   BREAST LUMPECTOMY Right 2005   BREAST LUMPECTOMY WITH NEEDLE LOCALIZATION AND AXILLARY SENTINEL LYMPH NODE BX Right 2005   BREAST LUMPECTOMY WITH RADIOACTIVE SEED AND SENTINEL LYMPH NODE BIOPSY Bilateral 04/14/2020   Procedure: BILATERAL RADIOACTIVE SEED GUIDED BREAST LUMPECTOMIES, BILATERAL AXILLARY SENTINEL LYMPH NODE BIOPSIES, WITH BLUE DYE INJECTION RIGHT BREAST;  Surgeon: Alphonsa Overall, MD;  Location: [place];  Service: General;  Laterality: Bilateral;   BREAST SURGERY     COLONOSCOPY WITH PROPOFOL N/A 04/02/2017   Procedure: COLONOSCOPY WITH PROPOFOL;  Surgeon: Jonathon Bellows, MD;  Location: [facility] ENDOSCOPY;   Service: Endoscopy;  Laterality: N/A;   ESOPHAGOGASTRODUODENOSCOPY (EGD) WITH PROPOFOL N/A 04/02/2017   Procedure: ESOPHAGOGASTRODUODENOSCOPY (EGD) WITH PROPOFOL;  Surgeon: Jonathon Bellows, MD;  Location: [facility] ENDOSCOPY;  Service: Endoscopy;  Laterality: N/A;   TONSILLECTOMY  ~2000    There were no vitals filed for this visit.   Subjective Assessment - 12/28/21 1050     Subjective  Mrs Scales presents for OT visit  6/36 to address BUE/BUQ lymphedema ( emphasis on LUQ). She denies LE realted pain today. Pt reports she purchased a sports bra    but did not bring it to clinic because it offeres inadequate support.    Pertinent History S/p ER+ R br ca in 2005 w/ XRT and hormone therapy. SLNB? # of LN?Marland Kitchen Dx w B br ca 8/2021S/P B lumpectomies 04/14/20. S/P partial R breast XRT and and full br XRT on L. S/p SLND. # LN?. Pt reports seroma L axilla s/p 2021 sx with infection. Arimadex for 5 years and currently on Aromasin with good tolerance. Reported symptoms of AWS have resolved.    Limitations chronmic breast swelling and associated pain/discomfort. Impaired L shoulder internal rotation-pain at end range when dressing; difficulty sleeping;    Repetition Increases Symptoms    Special Tests Intake FOTO score :92/100    Patient Stated Goals reduce breast swelling and pain. limit infection risk  Currently in Pain? No/denies    Pain Onset --   concurrent w/ XRT in 2021                         OT Treatments/Exercises (OP) - 12/28/21 1051       ADLs   ADL Education Given Yes      Manual Therapy   Manual Therapy Edema management;Manual Lymphatic Drainage (MLD)    Manual Lymphatic Drainage (MLD) MLD LUQ and L breast utilizing short neck sequence, subclavicular  plexus, axillary-inguinal anastamosis, intercostal pathway.                    OT Education - 12/28/21 1052     Education Details Continued Pt/ CG edu for lymphedema self care  and home program throughout session.  Topics include multilayer, gradient compression wrapping, simple self-MLD, therapeutic lymphatic pumping exercises, skin/nail care, risk reduction factors and LE precautions, compression garments/recommendations and wear and care schedule and compression garment donning / doffing using assistive devices. All questions answered to the Pt's satisfaction, and Pt demonstrates understanding by report.    Person(s) Educated Patient    Methods Explanation;Demonstration;Handout    Comprehension Verbalized understanding;Returned demonstration                 OT Long Term Goals - 12/08/21 0953       OT LONG TERM GOAL #1   Title Given this patients Intake score of 92/100 on the functional outcomes FOTO tool, patient will experience an increase in function of 5 points to improve basic and instrumental ADLs performance, including lymphedema self-care.    Baseline 92/100    Time 12    Period Weeks    Status New    Target Date 03/07/22      OT LONG TERM GOAL #2   Title Pt will demonstrate understanding of lymphedema prevention strategies by identifying signs and symptoms of cellulitis infection and by naming/ discussing 3 prevention strategies using printed reference (modified assistance) to reduce risk of progression and to limit infection risk.    Baseline Max A    Time 4    Period Days    Status New    Target Date --   4th OT Rx visit     OT LONG TERM GOAL #3   Title Pt will achieve at least a 10% volume reduction in the L breast to return it to more typical  size and shape,  to limit lymphedema progression and to limit infection risk.    Baseline Measurements TBA at OT Rx visit 1    Time 12    Period Weeks    Status New    Target Date 03/07/22      OT LONG TERM GOAL #5   Title Pt will achieve and sustain a least 85% compliance with all 4 LE self-care home program components throughout Intensive Phase CDT, including modified simple self-MLD, daily skin inspection and care, lymphatic  pumping the ex and compression to sustain clinical gains made in CDT and to limit lymphedema progression and further functional decline.    Baseline Max A    Time 12    Period Weeks    Status New    Target Date 03/07/22      Long Term Additional Goals   Additional Long Term Goals Yes      OT LONG TERM GOAL #6   Title Pt will be able to perform all lymphedema self-care home  program components using correct techniques with extra time and assistive devices PRN (modified independence), including simple self MLD, lymphatic pumping exercise, don and doff appropriate compression garments/ devices and perform daily skin care regime to limit lymphedema progression and infection risk.    Baseline Max A    Time 12    Period Weeks    Status New    Target Date 03/07/22                   Plan - 12/28/21 1044     Clinical Impression Statement Continued LUQ MLD today w emphasis on lateral trunk and L axilla in supine and sidelying. Pt tolerated light fibrosis techniques minimal increase in pain at axilla. Pt c/o point tendeness at 6 6 o'clock at L axilla. No palpable lymphadenopathy. Pt edu  focused on teaching simple self MLD. Pt able to perform diaphragmatic breathing aftetr skilled instruction, and verbalzed rational for lymphatic mobilization. Pt needs a bit of practice with  J stroke technique. Will continue teaching until sequences are mastered. We continue to research optimal compression wear. Manufacturer's rep coming at lunch with samles during visit interval. Cont as per POC.    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;Decreased knowledge of precautions;Decreased  knowledge of use of DME;IADL;Skin integrity;Pain;Edema;ROM    Rehab Potential Good    Clinical Decision Making Limited treatment options, no task modification necessary    Comorbidities Affecting Occupational Performance: May have comorbidities impacting occupational performance    Comorbidities impacting occupational performance description: hx L axillary seroma st surgical site with cellulitis    Modification or Assistance to Complete Evaluation  No modification of tasks or assist necessary to complete eval    OT Frequency 2x / week    OT Duration 12 weeks   and PRN   OT Treatment/Interventions Self-care/ADL training;Therapeutic exercise;DME and/or AE instruction;Other (comment);Manual Therapy;Scar mobilization;Coping strategies training;Patient/family education;Therapeutic activities;Manual lymph drainage   breast and trunk compression-alternative   Plan Complete Decongestive Therapy to include MLD, skin care, therapeutic exercise, compression. Consider fitting Pt with compression bra, convoluted foam insert for HOS to reduce fibrosis. Consider kinesiotape, Consider advanced sequential pneumatic compression device (Flexitouch),    Consulted and Agree with Plan of Care Patient             Patient will benefit from skilled therapeutic intervention in order to improve the following deficits and impairments:   Body Structure / Function / Physical Skills: UE functional use, Decreased knowledge of precautions, Decreased knowledge of use of DME, IADL, Skin integrity, Pain, Edema, ROM       Visit Diagnosis: Post-mastectomy lymphedema syndrome    Problem List Patient Active Problem List   Diagnosis Date Noted   Goals of care, counseling/discussion 03/10/2020   Bilateral malignant neoplasm of breast in female [facility]) 03/10/2020   Hx of adenomatous colonic polyps 04/29/2018   Hyperlipidemia, mild 01/02/2016   Anemia 07/06/2014   Asthma 07/06/2014   Environmental allergies 07/06/2014    GERD (gastroesophageal reflux disease) 07/06/2014   History of recurrent UTIs 07/06/2014   Osteoporosis 07/06/2014   Screening for cervical cancer 09/25/2011   History of breast cancer 09/25/2011    Andrey Spearman, MS, OTR/L, [facility] 12/28/21 10:53 AM   [place]  [facility] MAIN [facility] SERVICES [place], [place], [address] Phone: [phone]   Fax:  [phone]  Name: MARCELYN RUPPE MRN: [id number] Date of Birth: [birth year]

## 2022-01-02 ENCOUNTER — Other Ambulatory Visit: Payer: Self-pay

## 2022-01-02 ENCOUNTER — Ambulatory Visit: Payer: 59 | Admitting: Occupational Therapy

## 2022-01-02 DIAGNOSIS — I972 Postmastectomy lymphedema syndrome: Secondary | ICD-10-CM

## 2022-01-02 NOTE — Therapy (Signed)
[place] MAIN [facility] SERVICES [address] [place], [place], [address] Phone: [phone]   Fax:  [phone]  Occupational Therapy Treatment  Patient Details  Name: Marissa Phillips MRN: [id number] Date of Birth: 11/21/1961 Referring Provider (OT): Marissa Phillips, [place]   Encounter Date: 01/02/2022   OT End of Session - 01/02/22 0811     Visit Number 7    Number of Visits 36    Date for OT Re-Evaluation 03/07/22    OT Start Time 0803    OT Stop Time 0903    OT Time Calculation (min) 60 min    Activity Tolerance Patient tolerated treatment well;No increased pain    Behavior During Therapy WFL for tasks assessed/performed             Past Medical History:  Diagnosis Date   Asthma    Breast cancer ([place]) 2005   Cancer [facility])    Breast- Right-2005   Osteopenia    Personal history of chemotherapy    Personal history of radiation therapy    PONV (postoperative nausea and vomiting)     Past Surgical History:  Procedure Laterality Date   BREAST BIOPSY Right 2005   +   BREAST BIOPSY Right 03/01/2020   [place] bx of mass at 2:00 heart marker, path pending   BREAST BIOPSY Left 03/01/2020   [place] bx mass at 3:00, coil marker, path pending   BREAST BIOPSY Right 03/01/2020   [place] bx of right axilla, no marker placed, path pending   BREAST LUMPECTOMY Right 2005   BREAST LUMPECTOMY WITH NEEDLE LOCALIZATION AND AXILLARY SENTINEL LYMPH NODE BX Right 2005   BREAST LUMPECTOMY WITH RADIOACTIVE SEED AND SENTINEL LYMPH NODE BIOPSY Bilateral 04/14/2020   Procedure: BILATERAL RADIOACTIVE SEED GUIDED BREAST LUMPECTOMIES, BILATERAL AXILLARY SENTINEL LYMPH NODE BIOPSIES, WITH BLUE DYE INJECTION RIGHT BREAST;  Surgeon: Alphonsa Overall, MD;  Location: [place];  Service: General;  Laterality: Bilateral;   BREAST SURGERY     COLONOSCOPY WITH PROPOFOL N/A 04/02/2017   Procedure: COLONOSCOPY WITH PROPOFOL;  Surgeon: Jonathon Bellows, MD;  Location: [facility] ENDOSCOPY;   Service: Endoscopy;  Laterality: N/A;   ESOPHAGOGASTRODUODENOSCOPY (EGD) WITH PROPOFOL N/A 04/02/2017   Procedure: ESOPHAGOGASTRODUODENOSCOPY (EGD) WITH PROPOFOL;  Surgeon: Jonathon Bellows, MD;  Location: [facility] ENDOSCOPY;  Service: Endoscopy;  Laterality: N/A;   TONSILLECTOMY  ~2000    There were no vitals filed for this visit.   Subjective Assessment - 01/02/22 6237     Subjective  Marissa Phillips presents for OT visit  7/36 to address BUE/BUQ lymphedema ( emphasis on LUQ). She denies LE realted pain today. Pt reports she purchased a sports bra   but did not bring it to clinic today.    Pertinent History S/p ER+ R br ca in 2005 w/ XRT and hormone therapy. SLNB? # of LN?Marland Kitchen Dx w B br ca 8/2021S/P B lumpectomies 04/14/20. S/P partial R breast XRT and and full br XRT on L. S/p SLND. # LN?. Pt reports seroma L axilla s/p 2021 sx with infection. Arimadex for 5 years and currently on Aromasin with good tolerance. Reported symptoms of AWS have resolved.    Limitations chronmic breast swelling and associated pain/discomfort. Impaired L shoulder internal rotation-pain at end range when dressing; difficulty sleeping;    Repetition Increases Symptoms    Special Tests Intake FOTO score :92/100    Patient Stated Goals reduce breast swelling and pain. limit infection risk    Pain Onset --  concurrent w/ XRT in 2021                         OT Treatments/Exercises (OP) - 01/02/22 8182       ADLs   ADL Education Given Yes      Manual Therapy   Manual Therapy Edema management;Manual Lymphatic Drainage (MLD)    Manual Lymphatic Drainage (MLD) MLD LUQ and L breast utilizing short neck sequence, subclavicular  plexus, axillary-inguinal anastamosis, intercostal pathway. Emphasis on scar massage and fibrosis techniques at inframammary crease and lateral trunk                    OT Education - 01/02/22 0939     Education Details Continued Pt/ CG edu for lymphedema self care  and home program  throughout session. Topics include multilayer, gradient compression wrapping, simple self-MLD, therapeutic lymphatic pumping exercises, skin/nail care, risk reduction factors and LE precautions, compression garments/recommendations and wear and care schedule and compression garment donning / doffing using assistive devices. All questions answered to the Pt's satisfaction, and Pt demonstrates understanding by report.    Person(s) Educated Patient    Methods Explanation;Demonstration;Handout    Comprehension Verbalized understanding;Returned demonstration                 OT Long Term Goals - 12/08/21 0953       OT LONG TERM GOAL #1   Title Given this patients Intake score of 92/100 on the functional outcomes FOTO tool, patient will experience an increase in function of 5 points to improve basic and instrumental ADLs performance, including lymphedema self-care.    Baseline 92/100    Time 12    Period Weeks    Status New    Target Date 03/07/22      OT LONG TERM GOAL #2   Title Pt will demonstrate understanding of lymphedema prevention strategies by identifying signs and symptoms of cellulitis infection and by naming/ discussing 3 prevention strategies using printed reference (modified assistance) to reduce risk of progression and to limit infection risk.    Baseline Max A    Time 4    Period Days    Status New    Target Date --   4th OT Rx visit     OT LONG TERM GOAL #3   Title Pt will achieve at least a 10% volume reduction in the L breast to return it to more typical  size and shape,  to limit lymphedema progression and to limit infection risk.    Baseline Measurements TBA at OT Rx visit 1    Time 12    Period Weeks    Status New    Target Date 03/07/22      OT LONG TERM GOAL #5   Title Pt will achieve and sustain a least 85% compliance with all 4 LE self-care home program components throughout Intensive Phase CDT, including modified simple self-MLD, daily skin inspection  and care, lymphatic pumping the ex and compression to sustain clinical gains made in CDT and to limit lymphedema progression and further functional decline.    Baseline Max A    Time 12    Period Weeks    Status New    Target Date 03/07/22      Long Term Additional Goals   Additional Long Term Goals Yes      OT LONG TERM GOAL #6   Title Pt will be able to perform all lymphedema self-care  home program components using correct techniques with extra time and assistive devices PRN (modified independence), including simple self MLD, lymphatic pumping exercise, don and doff appropriate compression garments/ devices and perform daily skin care regime to limit lymphedema progression and infection risk.    Baseline Max A    Time 12    Period Weeks    Status New    Target Date 03/07/22                   Plan - 01/02/22 0925     Clinical Impression Statement Emphasis of manual therapy today on MLD with fibrosis techniques and scar massage at inframammary crease and lateral trunk, including intercostal spaces. Pt c/o mild point tenderness at lateral trunk again at region of 7th-8th ribs. Pt tolerated treatment well with palpable softening at inframammary fold after Rx. Cont as per POC.    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;Decreased knowledge of precautions;Decreased knowledge of use of DME;IADL;Skin integrity;Pain;Edema;ROM    Rehab Potential Good    Clinical Decision Making Limited treatment options, no task modification necessary    Comorbidities Affecting Occupational Performance: May have comorbidities impacting occupational performance    Comorbidities  impacting occupational performance description: hx L axillary seroma st surgical site with cellulitis    Modification or Assistance to Complete Evaluation  No modification of tasks or assist necessary to complete eval    OT Frequency 2x / week    OT Duration 12 weeks   and PRN   OT Treatment/Interventions Self-care/ADL training;Therapeutic exercise;DME and/or AE instruction;Other (comment);Manual Therapy;Scar mobilization;Coping strategies training;Patient/family education;Therapeutic activities;Manual lymph drainage   breast and trunk compression-alternative   Plan Complete Decongestive Therapy to include MLD, skin care, therapeutic exercise, compression. Consider fitting Pt with compression bra, convoluted foam insert for HOS to reduce fibrosis. Consider kinesiotape, Consider advanced sequential pneumatic compression device (Flexitouch),    Consulted and Agree with Plan of Care Patient             Patient will benefit from skilled therapeutic intervention in order to improve the following deficits and impairments:   Body Structure / Function / Physical Skills: UE functional use, Decreased knowledge of precautions, Decreased knowledge of use of DME, IADL, Skin integrity, Pain, Edema, ROM       Visit Diagnosis: Post-mastectomy lymphedema syndrome    Problem List Patient Active Problem List   Diagnosis Date Noted   Goals of care, counseling/discussion 03/10/2020   Bilateral malignant neoplasm of breast in female [facility]) 03/10/2020   Hx of adenomatous colonic polyps 04/29/2018   Hyperlipidemia, mild 01/02/2016   Anemia 07/06/2014   Asthma 07/06/2014   Environmental allergies 07/06/2014   GERD (gastroesophageal reflux disease) 07/06/2014   History of recurrent UTIs 07/06/2014   Osteoporosis 07/06/2014   Screening for cervical cancer 09/25/2011   History of breast cancer 09/25/2011    Andrey Spearman, MS, OTR/L, [facility] 01/02/22 9:40 AM   [place] MAIN [facility] [address] SERVICES [address] [place], [place], [address] Phone: [phone]   Fax:  [phone]  Name: Marissa Phillips MRN: [id number] Date of Birth: 12/05/1960

## 2022-01-05 ENCOUNTER — Ambulatory Visit: Payer: 59 | Admitting: Occupational Therapy

## 2022-01-05 ENCOUNTER — Other Ambulatory Visit: Payer: Self-pay

## 2022-01-05 DIAGNOSIS — I972 Postmastectomy lymphedema syndrome: Secondary | ICD-10-CM | POA: Diagnosis not present

## 2022-01-05 NOTE — Therapy (Signed)
Mar[facility] SERVICES [address] [place], [place], [address] Phone: [phone]   Fax:  [phone]  Occupational Therapy Treatment  Patient Details  Name: Marissa Phillips MRN: [id number] Date of Birth: September 04, 1961 Referring Provider (OT): Thea Silversmith, [place]   Encounter Date: 01/05/2022   OT End of Session - 01/05/22 0912     Visit Number 8    Number of Visits 36    Date for OT Re-Evaluation 03/07/22    OT Start Time 0800    OT Stop Time 0900    OT Time Calculation (min) 60 min    Activity Tolerance Patient tolerated treatment well;No increased pain    Behavior During Therapy WFL for tasks assessed/performed             Past Medical History:  Diagnosis Date   Asthma    Breast cancer ([place]) 2005   Cancer [facility])    Breast- Right-2005   Osteopenia    Personal history of chemotherapy    Personal history of radiation therapy    PONV (postoperative nausea and vomiting)     Past Surgical History:  Procedure Laterality Date   BREAST BIOPSY Right 2005   +   BREAST BIOPSY Right 03/01/2020   [place] bx of mass at 2:00 heart marker, path pending   BREAST BIOPSY Left 03/01/2020   [place] bx mass at 3:00, coil marker, path pending   BREAST BIOPSY Right 03/01/2020   [place] bx of right axilla, no marker placed, path pending   BREAST LUMPECTOMY Right 2005   BREAST LUMPECTOMY WITH NEEDLE LOCALIZATION AND AXILLARY SENTINEL LYMPH NODE BX Right 2005   BREAST LUMPECTOMY WITH RADIOACTIVE SEED AND SENTINEL LYMPH NODE BIOPSY Bilateral 04/14/2020   Procedure: BILATERAL RADIOACTIVE SEED GUIDED BREAST LUMPECTOMIES, BILATERAL AXILLARY SENTINEL LYMPH NODE BIOPSIES, WITH BLUE DYE INJECTION RIGHT BREAST;  Surgeon: Alphonsa Overall, MD;  Location: [place];  Service: General;  Laterality: Bilateral;   BREAST SURGERY     COLONOSCOPY WITH PROPOFOL N/A 04/02/2017   Procedure: COLONOSCOPY WITH PROPOFOL;  Surgeon: Jonathon Bellows, MD;  Location: [facility] ENDOSCOPY;   Service: Endoscopy;  Laterality: N/A;   ESOPHAGOGASTRODUODENOSCOPY (EGD) WITH PROPOFOL N/A 04/02/2017   Procedure: ESOPHAGOGASTRODUODENOSCOPY (EGD) WITH PROPOFOL;  Surgeon: Jonathon Bellows, MD;  Location: [facility] ENDOSCOPY;  Service: Endoscopy;  Laterality: N/A;   TONSILLECTOMY  ~2000    There were no vitals filed for this visit.   Subjective Assessment - 01/05/22 7564     Subjective  [name] presents for OT visit  8/36 to address BUE/BUQ lymphedema ( emphasis on LUQ). She denies LE realted pain today. Pt wears recently purchased    Pertinent History S/p ER+ R br ca in 2005 w/ XRT and hormone therapy. SLNB? # of LN?Marland Kitchen Dx w B br ca 8/2021S/P B lumpectomies 04/14/20. S/P partial R breast XRT and and full br XRT on L. S/p SLND. # LN?. Pt reports seroma L axilla s/p 2021 sx with infection. Arimadex for 5 years and currently on Aromasin with good tolerance. Reported symptoms of AWS have resolved.    Limitations chronmic breast swelling and associated pain/discomfort. Impaired L shoulder internal rotation-pain at end range when dressing; difficulty sleeping;    Repetition Increases Symptoms    Special Tests Intake FOTO score :92/100    Patient Stated Goals reduce breast swelling and pain. limit infection risk    Pain Onset --   concurrent w/ XRT in 2021  OT Treatments/Exercises (OP) - 01/05/22 0914       ADLs   ADL Education Given Yes      Manual Therapy   Manual Therapy Edema management    Edema Management two 2" wide strips of kinesiotape applied with anchor to contralateral PAA and extending under L breast at incision site and inframamarry fold. Second strip anchored as first, but extending anteriorlt across breast superior to nipple over area of peau de orange.    Manual Lymphatic Drainage (MLD) MLD LUQ and L breast utilizing short neck sequence, subclavicular  plexus, axillary-inguinal anastamosis, intercostal pathway. Emphasis on scar massage and fibrosis  techniques at inframammary crease and lateral trunk                    OT Education - 01/05/22 0913     Education Details Continued researching appropriate compression bras and local resourses as well as on line resources. Pt able to identify why bra she wore to clinic today is less than effective for controlling breast swelling without cues.    Person(s) Educated Patient    Methods Explanation;Demonstration;Handout    Comprehension Verbalized understanding;Returned demonstration;Need further instruction                 OT Long Term Goals - 12/08/21 0953       OT LONG TERM GOAL #1   Title Given this patients Intake score of 92/100 on the functional outcomes FOTO tool, patient will experience an increase in function of 5 points to improve basic and instrumental ADLs performance, including lymphedema self-care.    Baseline 92/100    Time 12    Period Weeks    Status New    Target Date 03/07/22      OT LONG TERM GOAL #2   Title Pt will demonstrate understanding of lymphedema prevention strategies by identifying signs and symptoms of cellulitis infection and by naming/ discussing 3 prevention strategies using printed reference (modified assistance) to reduce risk of progression and to limit infection risk.    Baseline Max A    Time 4    Period Days    Status New    Target Date --   4th OT Rx visit     OT LONG TERM GOAL #3   Title Pt will achieve at least a 10% volume reduction in the L breast to return it to more typical  size and shape,  to limit lymphedema progression and to limit infection risk.    Baseline Measurements TBA at OT Rx visit 1    Time 12    Period Weeks    Status New    Target Date 03/07/22      OT LONG TERM GOAL #5   Title Pt will achieve and sustain a least 85% compliance with all 4 LE self-care home program components throughout Intensive Phase CDT, including modified simple self-MLD, daily skin inspection and care, lymphatic pumping the ex  and compression to sustain clinical gains made in CDT and to limit lymphedema progression and further functional decline.    Baseline Max A    Time 12    Period Weeks    Status New    Target Date 03/07/22      Long Term Additional Goals   Additional Long Term Goals Yes      OT LONG TERM GOAL #6   Title Pt will be able to perform all lymphedema self-care home program components using correct techniques with extra time and assistive  devices PRN (modified independence), including simple self MLD, lymphatic pumping exercise, don and doff appropriate compression garments/ devices and perform daily skin care regime to limit lymphedema progression and infection risk.    Baseline Max A    Time 12    Period Weeks    Status New    Target Date 03/07/22                   Plan - 01/05/22 0917     Clinical Impression Statement Provided LUQ MLD utilizing PAA and AI , incorporation fibrosis techniques, scar massag and myofacial release  at inframammary crease, lateral trunk, including intercostal spaces. Pt c/o mild point tenderness at lateral trunk again at region of 7th-8th ribs. Pt tolerated treatment well with palpable softening at inframammary fold after Rx. Applied kinesiotape again today anchoring at contralateral PAA and extending over lateral trunk and breast as described in TREATMENT section. Kermit Balo tolerance for all manual techniques. Cont as per POC.    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;Decreased knowledge of precautions;Decreased knowledge of use of DME;IADL;Skin integrity;Pain;Edema;ROM    Rehab Potential Good    Clinical Decision Making Limited  treatment options, no task modification necessary    Comorbidities Affecting Occupational Performance: May have comorbidities impacting occupational performance    Comorbidities impacting occupational performance description: hx L axillary seroma st surgical site with cellulitis    Modification or Assistance to Complete Evaluation  No modification of tasks or assist necessary to complete eval    OT Frequency 2x / week    OT Duration 12 weeks   and PRN   OT Treatment/Interventions Self-care/ADL training;Therapeutic exercise;DME and/or AE instruction;Other (comment);Manual Therapy;Scar mobilization;Coping strategies training;Patient/family education;Therapeutic activities;Manual lymph drainage   breast and trunk compression-alternative   Plan Complete Decongestive Therapy to include MLD, skin care, therapeutic exercise, compression. Consider fitting Pt with compression bra, convoluted foam insert for HOS to reduce fibrosis. Consider kinesiotape, Consider advanced sequential pneumatic compression device (Flexitouch),    Consulted and Agree with Plan of Care Patient             Patient will benefit from skilled therapeutic intervention in order to improve the following deficits and impairments:   Body Structure / Function / Physical Skills: UE functional use, Decreased knowledge of precautions, Decreased knowledge of use of DME, IADL, Skin integrity, Pain, Edema, ROM       Visit Diagnosis: Post-mastectomy lymphedema syndrome    Problem List Patient Active Problem List   Diagnosis Date Noted   Goals of care, counseling/discussion 03/10/2020   Bilateral malignant neoplasm of breast in female [facility]) 03/10/2020   Hx of adenomatous colonic polyps 04/29/2018   Hyperlipidemia, mild 01/02/2016   Anemia 07/06/2014   Asthma 07/06/2014   Environmental allergies 07/06/2014   GERD (gastroesophageal reflux disease) 07/06/2014   History of recurrent UTIs 07/06/2014   Osteoporosis 07/06/2014    Screening for cervical cancer 09/25/2011   History of breast cancer 09/25/2011   Andrey Spearman, MS, OTR/L, [facility] 01/05/22 9:26 AM   [place] [address] [place], [place], [address] Phone: [phone]   Fax:  [phone]  Name: Marissa Phillips MRN: [id number] Date of Birth: 06-Apr-1961

## 2022-01-08 DIAGNOSIS — C50911 Malignant neoplasm of unspecified site of right female breast: Secondary | ICD-10-CM | POA: Diagnosis not present

## 2022-01-08 DIAGNOSIS — C50912 Malignant neoplasm of unspecified site of left female breast: Secondary | ICD-10-CM | POA: Diagnosis not present

## 2022-01-09 ENCOUNTER — Other Ambulatory Visit: Payer: Self-pay

## 2022-01-09 ENCOUNTER — Ambulatory Visit: Payer: 59 | Admitting: Occupational Therapy

## 2022-01-09 DIAGNOSIS — I972 Postmastectomy lymphedema syndrome: Secondary | ICD-10-CM | POA: Diagnosis not present

## 2022-01-09 NOTE — Therapy (Signed)
[place] MAIN [facility] SERVICES [address] [place], [place], [address] Phone: [phone]   Fax:  [phone]  Occupational Therapy Treatment  Patient Details  Name: Marissa Phillips MRN: [id number] Date of Birth: 04/24/1961 Referring Provider (OT): Thea Silversmith, [place]   Encounter Date: 01/09/2022   OT End of Session - 01/09/22 0909     Visit Number 9    Number of Visits 36    Date for OT Re-Evaluation 03/07/22    OT Start Time 0800    OT Stop Time 0900    OT Time Calculation (min) 60 min    Activity Tolerance Patient tolerated treatment well;No increased pain    Behavior During Therapy WFL for tasks assessed/performed             Past Medical History:  Diagnosis Date   Asthma    Breast cancer ([place]) 2005   Cancer [facility])    Breast- Right-2005   Osteopenia    Personal history of chemotherapy    Personal history of radiation therapy    PONV (postoperative nausea and vomiting)     Past Surgical History:  Procedure Laterality Date   BREAST BIOPSY Right 2005   +   BREAST BIOPSY Right 03/01/2020   [place] bx of mass at 2:00 heart marker, path pending   BREAST BIOPSY Left 03/01/2020   [place] bx mass at 3:00, coil marker, path pending   BREAST BIOPSY Right 03/01/2020   [place] bx of right axilla, no marker placed, path pending   BREAST LUMPECTOMY Right 2005   BREAST LUMPECTOMY WITH NEEDLE LOCALIZATION AND AXILLARY SENTINEL LYMPH NODE BX Right 2005   BREAST LUMPECTOMY WITH RADIOACTIVE SEED AND SENTINEL LYMPH NODE BIOPSY Bilateral 04/14/2020   Procedure: BILATERAL RADIOACTIVE SEED GUIDED BREAST LUMPECTOMIES, BILATERAL AXILLARY SENTINEL LYMPH NODE BIOPSIES, WITH BLUE DYE INJECTION RIGHT BREAST;  Surgeon: Alphonsa Overall, MD;  Location: [place];  Service: General;  Laterality: Bilateral;   BREAST SURGERY     COLONOSCOPY WITH PROPOFOL N/A 04/02/2017   Procedure: COLONOSCOPY WITH PROPOFOL;  Surgeon: Jonathon Bellows, MD;  Location: [facility] ENDOSCOPY;   Service: Endoscopy;  Laterality: N/A;   ESOPHAGOGASTRODUODENOSCOPY (EGD) WITH PROPOFOL N/A 04/02/2017   Procedure: ESOPHAGOGASTRODUODENOSCOPY (EGD) WITH PROPOFOL;  Surgeon: Jonathon Bellows, MD;  Location: [facility] ENDOSCOPY;  Service: Endoscopy;  Laterality: N/A;   TONSILLECTOMY  ~2000    There were no vitals filed for this visit.   Subjective Assessment - 01/09/22 1049     Subjective  Marissa Phillips presents for OT visit  9/36 to address BUE/BUQ lymphedema ( emphasis on LUQ). She denies LE realted pain today. Marissa Phillips wears newly purchased lymphedema compression bra , Praire Wear, to clinic for assessment.    Pertinent History S/p ER+ R br ca in 2005 w/ XRT and hormone therapy. SLNB? # of LN?Marland Kitchen Dx w B br ca 8/2021S/P B lumpectomies 04/14/20. S/P partial R breast XRT and and full br XRT on L. S/p SLND. # LN?. Marissa Phillips reports seroma L axilla s/p 2021 sx with infection. Arimadex for 5 years and currently on Aromasin with good tolerance. Reported symptoms of AWS have resolved.    Limitations chronmic breast swelling and associated pain/discomfort. Impaired L shoulder internal rotation-pain at end range when dressing; difficulty sleeping;    Repetition Increases Symptoms    Special Tests Intake FOTO score :92/100    Patient Stated Goals reduce breast swelling and pain. limit infection risk    Pain Onset --   concurrent w/ XRT  in 2021                         OT Treatments/Exercises (OP) - 01/09/22 0912       ADLs   ADL Education Given Yes      Manual Therapy   Manual Therapy Edema management;Manual Lymphatic Drainage (MLD)    Edema Management assessment of Praire brand lymphedema compression bra. good fit!    Manual Lymphatic Drainage (MLD) MLD to LUQ as estalished                    OT Education - 01/09/22 0915     Education Details Continued Marissa Phillips edu re daytime and HOS compression to L preast and thorax. Pointed out features of Marissa Phillips's new bra that provide excellent support. Brainstormed  how we might modify lower band to eliminate rolling and increased constriction.    Person(s) Educated Patient    Methods Explanation;Handout    Comprehension Verbalized understanding;Returned demonstration                 OT Long Term Goals - 01/09/22 1045       OT LONG TERM GOAL #1   Title Given this patients Intake score of 92/100 on the functional outcomes FOTO tool, patient will experience an increase in function of 5 points to improve basic and instrumental ADLs performance, including lymphedema self-care.    Baseline 92/100    Time 12    Period Weeks    Status New    Target Date 03/07/22      OT LONG TERM GOAL #2   Title Marissa Phillips will demonstrate understanding of lymphedema prevention strategies by identifying signs and symptoms of cellulitis infection and by naming/ discussing 3 prevention strategies using printed reference (modified assistance) to reduce risk of progression and to limit infection risk.    Baseline Max A    Time 4    Period Days    Status Achieved    Target Date --   4th OT Rx visit     OT LONG TERM GOAL #3   Title Marissa Phillips will achieve at least a 10% volume reduction in the L breast to return it to more typical  size and shape,  to limit lymphedema progression and to limit infection risk.    Baseline Measurements TBA at OT Rx visit 1    Time 12    Period Weeks    Status New    Target Date 03/07/22      OT LONG TERM GOAL #4   Title Marissa Phillips will obtain recommended compression bra and convoluted foam pad insert, and tolerate full-time wear schedule within 7 days to reduce existing tissue fibrosis and to facilitate improved lymphatic drainage from the L breast, lateral trunk and axilla.    Baseline Max A    Time 4    Period Weeks    Status Partially Met   01/09/22: Marissa Phillips has obtained bra and pad and is currently  working on building wear tolerance , starting w 4 hrs daytime and HOS without pad initially.   Target Date 03/07/22      OT LONG TERM GOAL #5   Title Marissa Phillips  will achieve and sustain a least 85% compliance with all 4 LE self-care home program components throughout Intensive Phase CDT, including modified simple self-MLD, daily skin inspection and care, lymphatic pumping the ex and compression to sustain clinical gains made in CDT and to limit lymphedema progression and  further functional decline.    Baseline Max A    Time 12    Period Weeks    Status New    Target Date 03/07/22      OT LONG TERM GOAL #6   Title Marissa Phillips will be able to perform all lymphedema self-care home program components using correct techniques with extra time and assistive devices PRN (modified independence), including simple self MLD, lymphatic pumping exercise, don and doff appropriate compression garments/ devices and perform daily skin care regime to limit lymphedema progression and infection risk.    Baseline Max A    Time 12    Period Weeks    Status New    Target Date 03/07/22                   Plan - 01/09/22 1045     Clinical Impression Statement Continued LUQ MLD to L breast, latera trunk and axilla utilizing AAA, PAA and AI , incorporating fibrosis techniques, scar massage and myofacial release  at inframammary crease and intercostal spaces. Marissa Phillips donned new Praire Wear compression bra purchased at [employer], a certified mastectomy bra fitter boutique in [place] provides appropriate coverage and compression to breasts, axillae and lateral trunk and offers pocket for convoluted foam JoviPak lumpectomy pad insert for HOS. Lower band does tend to roll increasing constriction at this critical area, which may limit function at AI. Marissa Phillips and OT discussed multiple ways to modify the band to limit rolling. Marissa Phillips will "live with it" during visit interval while we continue to consider options. Marissa Phillips will also wear Jovi pad directly against skin and pull pad under lower band in an effort to reduce constriction and improve comfort. Good tolerance for all manual techniques. Cont 2 x  weekly OT as per POC.    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;Decreased knowledge of precautions;Decreased knowledge of use of DME;IADL;Skin integrity;Pain;Edema;ROM    Rehab Potential Good    Clinical Decision Making Limited treatment options, no task modification necessary    Comorbidities Affecting Occupational Performance: May have comorbidities impacting occupational performance    Comorbidities impacting occupational performance description: hx L axillary seroma st surgical site with cellulitis    Modification or Assistance to Complete Evaluation  No modification of tasks or assist necessary to complete eval    OT Frequency 2x / week    OT Duration 12 weeks   and PRN   OT Treatment/Interventions Self-care/ADL training;Therapeutic exercise;DME and/or AE instruction;Other (comment);Manual Therapy;Scar mobilization;Coping strategies training;Patient/family education;Therapeutic activities;Manual lymph drainage   breast and trunk compression-alternative   Plan Complete Decongestive Therapy to include MLD, skin care, therapeutic exercise, compression. Consider fitting Marissa Phillips with compression bra, convoluted foam insert for HOS to reduce fibrosis. Consider kinesiotape, Consider advanced sequential pneumatic compression device (Flexitouch),    Consulted and Agree with Plan of Care Patient             Patient will benefit from skilled therapeutic intervention in order to improve the following deficits and impairments:   Body Structure / Function / Physical Skills: UE functional use, Decreased knowledge of precautions, Decreased knowledge of use of DME, IADL, Skin integrity,  Pain, Edema, ROM       Visit Diagnosis: Post-mastectomy  lymphedema syndrome    Problem List Patient Active Problem List   Diagnosis Date Noted   Goals of care, counseling/discussion 03/10/2020   Bilateral malignant neoplasm of breast in female [facility]) 03/10/2020   Hx of adenomatous colonic polyps 04/29/2018   Hyperlipidemia, mild 01/02/2016   Anemia 07/06/2014   Asthma 07/06/2014   Environmental allergies 07/06/2014   GERD (gastroesophageal reflux disease) 07/06/2014   History of recurrent UTIs 07/06/2014   Osteoporosis 07/06/2014   Screening for cervical cancer 09/25/2011   History of breast cancer 09/25/2011    Marissa Spearman, MS, OTR/L, [facility] 01/09/22 10:51 AM   [place] MAIN [facility] SERVICES [address] [place], [place], [address] Phone: [phone]   Fax:  [phone]  Name: JAY KEMPE MRN: [id number] Date of Birth: 1961/04/03

## 2022-01-09 NOTE — Therapy (Signed)
[place] MAIN [facility] SERVICES [address] [place], [place], [address] Phone: [phone]   Fax:  [phone]  Occupational Therapy Treatment  Patient Details  Name: Marissa Phillips MRN: [id number] Date of Birth: [birth year] Referring Provider (OT): Thea Silversmith, [place]   Encounter Date: 01/09/2022   OT End of Session - 01/09/22 0909     Visit Number 9    Number of Visits 36    Date for OT Re-Evaluation 03/07/22    OT Start Time 0800    OT Stop Time 0900    OT Time Calculation (min) 60 min    Activity Tolerance Patient tolerated treatment well;No increased pain    Behavior During Therapy WFL for tasks assessed/performed             Past Medical History:  Diagnosis Date   Asthma    Breast cancer ([place]) 2005   Cancer [facility])    Breast- Right-2005   Osteopenia    Personal history of chemotherapy    Personal history of radiation therapy    PONV (postoperative nausea and vomiting)     Past Surgical History:  Procedure Laterality Date   BREAST BIOPSY Right 2005   +   BREAST BIOPSY Right 03/01/2020   [place] bx of mass at 2:00 heart marker, path pending   BREAST BIOPSY Left 03/01/2020   [place] bx mass at 3:00, coil marker, path pending   BREAST BIOPSY Right 03/01/2020   [place] bx of right axilla, no marker placed, path pending   BREAST LUMPECTOMY Right 2005   BREAST LUMPECTOMY WITH NEEDLE LOCALIZATION AND AXILLARY SENTINEL LYMPH NODE BX Right 2005   BREAST LUMPECTOMY WITH RADIOACTIVE SEED AND SENTINEL LYMPH NODE BIOPSY Bilateral 04/14/2020   Procedure: BILATERAL RADIOACTIVE SEED GUIDED BREAST LUMPECTOMIES, BILATERAL AXILLARY SENTINEL LYMPH NODE BIOPSIES, WITH BLUE DYE INJECTION RIGHT BREAST;  Surgeon: Alphonsa Overall, MD;  Location: [place];  Service: General;  Laterality: Bilateral;   BREAST SURGERY     COLONOSCOPY WITH PROPOFOL N/A 04/02/2017   Procedure: COLONOSCOPY WITH PROPOFOL;  Surgeon: Jonathon Bellows, MD;  Location: [facility] ENDOSCOPY;   Service: Endoscopy;  Laterality: N/A;   ESOPHAGOGASTRODUODENOSCOPY (EGD) WITH PROPOFOL N/A 04/02/2017   Procedure: ESOPHAGOGASTRODUODENOSCOPY (EGD) WITH PROPOFOL;  Surgeon: Jonathon Bellows, MD;  Location: [facility] ENDOSCOPY;  Service: Endoscopy;  Laterality: N/A;   TONSILLECTOMY  ~2000    There were no vitals filed for this visit.                 OT Treatments/Exercises (OP) - 01/09/22 0912       ADLs   ADL Education Given Yes      Manual Therapy   Manual Therapy Edema management;Manual Lymphatic Drainage (MLD)    Edema Management assessment of Praire brand lymphedema compression bra. good fit!    Manual Lymphatic Drainage (MLD) MLD to LUQ as estalished                    OT Education - 01/09/22 0915     Education Details Continued Pt edu re daytime and HOS compression to L preast and thorax. Pointed out features of Pt's new bra that provide excellent support. Brainstormed how we might modify lower band to eliminate rolling and increased constriction.    Person(s) Educated Patient    Methods Explanation;Handout    Comprehension Verbalized understanding;Returned demonstration                 OT Long Term Goals - 12/08/21  [place] #1   Title Given this patients Intake score of 92/100 on the functional outcomes FOTO tool, patient will experience an increase in function of 5 points to improve basic and instrumental ADLs performance, including lymphedema self-care.    Baseline 92/100    Time 12    Period Weeks    Status New    Target Date 03/07/22      OT LONG TERM GOAL #2   Title Pt will demonstrate understanding of lymphedema prevention strategies by identifying signs and symptoms of cellulitis infection and by naming/ discussing 3 prevention strategies using printed reference (modified assistance) to reduce risk of progression and to limit infection risk.    Baseline Max A    Time 4    Period Days    Status New    Target Date --    4th OT Rx visit     OT LONG TERM GOAL #3   Title Pt will achieve at least a 10% volume reduction in the L breast to return it to more typical  size and shape,  to limit lymphedema progression and to limit infection risk.    Baseline Measurements TBA at OT Rx visit 1    Time 12    Period Weeks    Status New    Target Date 03/07/22      OT LONG TERM GOAL #5   Title Pt will achieve and sustain a least 85% compliance with all 4 LE self-care home program components throughout Intensive Phase CDT, including modified simple self-MLD, daily skin inspection and care, lymphatic pumping the ex and compression to sustain clinical gains made in CDT and to limit lymphedema progression and further functional decline.    Baseline Max A    Time 12    Period Weeks    Status New    Target Date 03/07/22      Long Term Additional Goals   Additional Long Term Goals Yes      OT LONG TERM GOAL #6   Title Pt will be able to perform all lymphedema self-care home program components using correct techniques with extra time and assistive devices PRN (modified independence), including simple self MLD, lymphatic pumping exercise, don and doff appropriate compression garments/ devices and perform daily skin care regime to limit lymphedema progression and infection risk.    Baseline Max A    Time 12    Period Weeks    Status New    Target Date 03/07/22                    Patient will benefit from skilled therapeutic intervention in order to improve the following deficits and impairments:   Body Structure / Function / Physical Skills: UE functional use, Decreased knowledge of precautions, Decreased knowledge of use of DME, IADL, Skin integrity, Pain, Edema, ROM       Visit Diagnosis: Post-mastectomy lymphedema syndrome    Problem List Patient Active Problem List   Diagnosis Date Noted   Goals of care, counseling/discussion 03/10/2020   Bilateral malignant neoplasm of breast in female [facility])  03/10/2020   Hx of adenomatous colonic polyps 04/29/2018   Hyperlipidemia, mild 01/02/2016   Anemia 07/06/2014   Asthma 07/06/2014   Environmental allergies 07/06/2014   GERD (gastroesophageal reflux disease) 07/06/2014   History of recurrent UTIs 07/06/2014   Osteoporosis 07/06/2014   Screening for cervical cancer 09/25/2011   History of breast cancer 09/25/2011  Ansel Bong, OT 01/09/2022, 9:45 AM  [place] MAIN [facility] SERVICES [address] [place], [place], [address] Phone: [phone]   Fax:  [phone]  Name: ROSALIND GUIDO MRN: [id number] Date of Birth: 02-10-1961

## 2022-01-09 NOTE — Therapy (Signed)
[place] MAIN [facility] SERVICES [address] [place], [place], [address] Phone: [phone]   Fax:  [phone]  Occupational Therapy Treatment  Patient Details  Name: Marissa Phillips MRN: [id number] Date of Birth: [birth year] Referring Provider (OT): Thea Silversmith, [place]   Encounter Date: 01/09/2022   OT End of Session - 01/09/22 0909     Visit Number 9    Number of Visits 36    Date for OT Re-Evaluation 03/07/22    OT Start Time 0800    OT Stop Time 0900    OT Time Calculation (min) 60 min    Activity Tolerance Patient tolerated treatment well;No increased pain    Behavior During Therapy WFL for tasks assessed/performed             Past Medical History:  Diagnosis Date   Asthma    Breast cancer ([place]) 2005   Cancer [facility])    Breast- Right-2005   Osteopenia    Personal history of chemotherapy    Personal history of radiation therapy    PONV (postoperative nausea and vomiting)     Past Surgical History:  Procedure Laterality Date   BREAST BIOPSY Right 2005   +   BREAST BIOPSY Right 03/01/2020   [place] bx of mass at 2:00 heart marker, path pending   BREAST BIOPSY Left 03/01/2020   [place] bx mass at 3:00, coil marker, path pending   BREAST BIOPSY Right 03/01/2020   [place] bx of right axilla, no marker placed, path pending   BREAST LUMPECTOMY Right 2005   BREAST LUMPECTOMY WITH NEEDLE LOCALIZATION AND AXILLARY SENTINEL LYMPH NODE BX Right 2005   BREAST LUMPECTOMY WITH RADIOACTIVE SEED AND SENTINEL LYMPH NODE BIOPSY Bilateral 04/14/2020   Procedure: BILATERAL RADIOACTIVE SEED GUIDED BREAST LUMPECTOMIES, BILATERAL AXILLARY SENTINEL LYMPH NODE BIOPSIES, WITH BLUE DYE INJECTION RIGHT BREAST;  Surgeon: Alphonsa Overall, MD;  Location: [place];  Service: General;  Laterality: Bilateral;   BREAST SURGERY     COLONOSCOPY WITH PROPOFOL N/A 04/02/2017   Procedure: COLONOSCOPY WITH PROPOFOL;  Surgeon: Jonathon Bellows, MD;  Location: [facility] ENDOSCOPY;   Service: Endoscopy;  Laterality: N/A;   ESOPHAGOGASTRODUODENOSCOPY (EGD) WITH PROPOFOL N/A 04/02/2017   Procedure: ESOPHAGOGASTRODUODENOSCOPY (EGD) WITH PROPOFOL;  Surgeon: Jonathon Bellows, MD;  Location: [facility] ENDOSCOPY;  Service: Endoscopy;  Laterality: N/A;   TONSILLECTOMY  ~2000    There were no vitals filed for this visit.                 OT Treatments/Exercises (OP) - 01/09/22 0912       ADLs   ADL Education Given Yes      Manual Therapy   Manual Therapy Edema management;Manual Lymphatic Drainage (MLD)    Edema Management assessment of Praire brand lymphedema compression bra. good fit!    Manual Lymphatic Drainage (MLD) MLD to LUQ as estalished                    OT Education - 01/09/22 0915     Education Details Continued Pt edu re daytime and HOS compression to L preast and thorax. Pointed out features of Pt's new bra that provide excellent support. Brainstormed how we might modify lower band to eliminate rolling and increased constriction.    Person(s) Educated Patient    Methods Explanation;Handout    Comprehension Verbalized understanding;Returned demonstration                 OT Long Term Goals - 12/08/21  [place] #1   Title Given this patients Intake score of 92/100 on the functional outcomes FOTO tool, patient will experience an increase in function of 5 points to improve basic and instrumental ADLs performance, including lymphedema self-care.    Baseline 92/100    Time 12    Period Weeks    Status New    Target Date 03/07/22      OT LONG TERM GOAL #2   Title Pt will demonstrate understanding of lymphedema prevention strategies by identifying signs and symptoms of cellulitis infection and by naming/ discussing 3 prevention strategies using printed reference (modified assistance) to reduce risk of progression and to limit infection risk.    Baseline Max A    Time 4    Period Days    Status New    Target Date --    4th OT Rx visit     OT LONG TERM GOAL #3   Title Pt will achieve at least a 10% volume reduction in the L breast to return it to more typical  size and shape,  to limit lymphedema progression and to limit infection risk.    Baseline Measurements TBA at OT Rx visit 1    Time 12    Period Weeks    Status New    Target Date 03/07/22      OT LONG TERM GOAL #5   Title Pt will achieve and sustain a least 85% compliance with all 4 LE self-care home program components throughout Intensive Phase CDT, including modified simple self-MLD, daily skin inspection and care, lymphatic pumping the ex and compression to sustain clinical gains made in CDT and to limit lymphedema progression and further functional decline.    Baseline Max A    Time 12    Period Weeks    Status New    Target Date 03/07/22      Long Term Additional Goals   Additional Long Term Goals Yes      OT LONG TERM GOAL #6   Title Pt will be able to perform all lymphedema self-care home program components using correct techniques with extra time and assistive devices PRN (modified independence), including simple self MLD, lymphatic pumping exercise, don and doff appropriate compression garments/ devices and perform daily skin care regime to limit lymphedema progression and infection risk.    Baseline Max A    Time 12    Period Weeks    Status New    Target Date 03/07/22                    Patient will benefit from skilled therapeutic intervention in order to improve the following deficits and impairments:   Body Structure / Function / Physical Skills: UE functional use, Decreased knowledge of precautions, Decreased knowledge of use of DME, IADL, Skin integrity, Pain, Edema, ROM       Visit Diagnosis: Post-mastectomy lymphedema syndrome    Problem List Patient Active Problem List   Diagnosis Date Noted   Goals of care, counseling/discussion 03/10/2020   Bilateral malignant neoplasm of breast in female [facility])  03/10/2020   Hx of adenomatous colonic polyps 04/29/2018   Hyperlipidemia, mild 01/02/2016   Anemia 07/06/2014   Asthma 07/06/2014   Environmental allergies 07/06/2014   GERD (gastroesophageal reflux disease) 07/06/2014   History of recurrent UTIs 07/06/2014   Osteoporosis 07/06/2014   Screening for cervical cancer 09/25/2011   History of breast cancer 09/25/2011  Ansel Bong, OT 01/09/2022, 9:46 AM  [place] MAIN [facility] SERVICES [address] [place], [place], [address] Phone: [phone]   Fax:  [phone]  Name: Marissa Phillips MRN: [id number] Date of Birth: 07/31/1961

## 2022-01-12 DIAGNOSIS — E785 Hyperlipidemia, unspecified: Secondary | ICD-10-CM | POA: Diagnosis not present

## 2022-01-12 DIAGNOSIS — M8000XA Age-related osteoporosis with current pathological fracture, unspecified site, initial encounter for fracture: Secondary | ICD-10-CM | POA: Diagnosis not present

## 2022-01-12 DIAGNOSIS — C50011 Malignant neoplasm of nipple and areola, right female breast: Secondary | ICD-10-CM | POA: Diagnosis not present

## 2022-01-12 DIAGNOSIS — Z0000 Encounter for general adult medical examination without abnormal findings: Secondary | ICD-10-CM | POA: Diagnosis not present

## 2022-01-12 DIAGNOSIS — M8580 Other specified disorders of bone density and structure, unspecified site: Secondary | ICD-10-CM | POA: Insufficient documentation

## 2022-01-15 ENCOUNTER — Other Ambulatory Visit: Payer: Self-pay

## 2022-01-15 ENCOUNTER — Ambulatory Visit: Payer: 59 | Admitting: Occupational Therapy

## 2022-01-22 ENCOUNTER — Other Ambulatory Visit: Payer: Self-pay

## 2022-01-24 ENCOUNTER — Ambulatory Visit: Payer: 59 | Admitting: Radiation Oncology

## 2022-01-25 DIAGNOSIS — C50911 Malignant neoplasm of unspecified site of right female breast: Secondary | ICD-10-CM | POA: Diagnosis not present

## 2022-01-25 DIAGNOSIS — C50912 Malignant neoplasm of unspecified site of left female breast: Secondary | ICD-10-CM | POA: Diagnosis not present

## 2022-01-26 ENCOUNTER — Ambulatory Visit: Payer: 59 | Attending: General Surgery | Admitting: Occupational Therapy

## 2022-01-26 ENCOUNTER — Other Ambulatory Visit: Payer: Self-pay

## 2022-01-26 DIAGNOSIS — E785 Hyperlipidemia, unspecified: Secondary | ICD-10-CM | POA: Diagnosis not present

## 2022-01-26 DIAGNOSIS — I972 Postmastectomy lymphedema syndrome: Secondary | ICD-10-CM | POA: Diagnosis not present

## 2022-01-26 DIAGNOSIS — Z79899 Other long term (current) drug therapy: Secondary | ICD-10-CM | POA: Diagnosis not present

## 2022-01-26 DIAGNOSIS — R7309 Other abnormal glucose: Secondary | ICD-10-CM | POA: Diagnosis not present

## 2022-01-26 DIAGNOSIS — E559 Vitamin D deficiency, unspecified: Secondary | ICD-10-CM | POA: Diagnosis not present

## 2022-01-26 NOTE — Therapy (Signed)
[place] MAIN [facility] SERVICES [address] [place], [place], [address] Phone: [phone]   Fax:  [phone]  Occupational Therapy Treatment Note and Progress Report: Lymphedema Care  Patient Details  Name: Marissa Phillips MRN: [id number] Date of Birth: 1961/03/03 Referring Provider (OT): Thea Silversmith, [place]   Encounter Date: 01/26/2022   OT End of Session - 01/26/22 0812     Visit Number 10    Number of Visits 36    Date for OT Re-Evaluation 03/07/22    OT Start Time 0815    OT Stop Time 0905    OT Time Calculation (min) 50 min    Activity Tolerance Patient tolerated treatment well;No increased pain    Behavior During Therapy WFL for tasks assessed/performed             Past Medical History:  Diagnosis Date   Asthma    Breast cancer ([place]) 2005   Cancer [facility])    Breast- Right-2005   Osteopenia    Personal history of chemotherapy    Personal history of radiation therapy    PONV (postoperative nausea and vomiting)     Past Surgical History:  Procedure Laterality Date   BREAST BIOPSY Right 2005   +   BREAST BIOPSY Right 03/01/2020   [place] bx of mass at 2:00 heart marker, path pending   BREAST BIOPSY Left 03/01/2020   [place] bx mass at 3:00, coil marker, path pending   BREAST BIOPSY Right 03/01/2020   [place] bx of right axilla, no marker placed, path pending   BREAST LUMPECTOMY Right 2005   BREAST LUMPECTOMY WITH NEEDLE LOCALIZATION AND AXILLARY SENTINEL LYMPH NODE BX Right 2005   BREAST LUMPECTOMY WITH RADIOACTIVE SEED AND SENTINEL LYMPH NODE BIOPSY Bilateral 04/14/2020   Procedure: BILATERAL RADIOACTIVE SEED GUIDED BREAST LUMPECTOMIES, BILATERAL AXILLARY SENTINEL LYMPH NODE BIOPSIES, WITH BLUE DYE INJECTION RIGHT BREAST;  Surgeon: Alphonsa Overall, MD;  Location: [place];  Service: General;  Laterality: Bilateral;   BREAST SURGERY     COLONOSCOPY WITH PROPOFOL N/A 04/02/2017   Procedure: COLONOSCOPY WITH PROPOFOL;  Surgeon: Jonathon Bellows, MD;  Location: [facility] ENDOSCOPY;  Service: Endoscopy;  Laterality: N/A;   ESOPHAGOGASTRODUODENOSCOPY (EGD) WITH PROPOFOL N/A 04/02/2017   Procedure: ESOPHAGOGASTRODUODENOSCOPY (EGD) WITH PROPOFOL;  Surgeon: Jonathon Bellows, MD;  Location: [facility] ENDOSCOPY;  Service: Endoscopy;  Laterality: N/A;   TONSILLECTOMY  ~2000    There were no vitals filed for this visit.   Subjective Assessment - 01/26/22 1058     Subjective  Marissa Phillips presents for OT visit  10/36 to address BUE/BUQ lymphedema ( emphasis on LUQ). She denies LE realted pain today. Pt wears newly purchased lymphedema compression bra , Praire Wear, and Jovipak lumpectomy pad to clinic. She reports fibrotic tissue at anterior axilla and lateral breast feels softer and hurts less.    Pertinent History S/p ER+ R br ca in 2005 w/ XRT and hormone therapy. SLNB? # of LN?Marland Kitchen Dx w B br ca 8/2021S/P B lumpectomies 04/14/20. S/P partial R breast XRT and and full br XRT on L. S/p SLND. # LN?. Pt reports seroma L axilla s/p 2021 sx with infection. Arimadex for 5 years and currently on Aromasin with good tolerance. Reported symptoms of AWS have resolved.    Limitations chronmic breast swelling and associated pain/discomfort. Impaired L shoulder internal rotation-pain at end range when dressing; difficulty sleeping;    Repetition Increases Symptoms    Special Tests Intake FOTO score :92/100  Patient Stated Goals reduce breast swelling and pain. limit infection risk    Currently in Pain? No/denies    Pain Onset --   concurrent w/ XRT in 2021                                 OT Education - 01/26/22 1100     Education Details Continued Pt edu re daytime and HOS compression to L preast and thorax. Pointed out features of Pt's new bra that provide excellent support. Brainstormed how we might modify lower band to eliminate rolling and increased constriction. Reviewed entire simple self MLD sequence    Person(s) Educated Patient     Methods Explanation;Handout    Comprehension Verbalized understanding;Returned demonstration                 OT Long Term Goals - 01/26/22 0813       OT LONG TERM GOAL #1   Title Given this patients Intake score of 92/100 on the functional outcomes FOTO tool, patient will experience an increase in function of 5 points to improve basic and instrumental ADLs performance, including lymphedema self-care.    Baseline 92/100    Time 12    Period Weeks    Status Achieved   Goal achieved with 98/100 on 10th visit, 01/26/22   Target Date 03/07/22      OT LONG TERM GOAL #2   Title Pt will demonstrate understanding of lymphedema prevention strategies by identifying signs and symptoms of cellulitis infection and by naming/ discussing 3 prevention strategies using printed reference (modified assistance) to reduce risk of progression and to limit infection risk.    Baseline Max A    Time 4    Period Days    Status Achieved    Target Date --   4th OT Rx visit     OT LONG TERM GOAL #3   Title Pt will achieve at least a 10% volume reduction in the L breast to return it to more typical  size and shape,  to limit lymphedema progression and to limit infection risk.    Baseline Measurements TBA at OT Rx visit 1    Time 12    Period Weeks    Status New    Target Date 03/07/22      OT LONG TERM GOAL #4   Title Pt will obtain recommended compression bra and convoluted foam pad insert, and tolerate full-time wear schedule within 7 days to reduce existing tissue fibrosis and to facilitate improved lymphatic drainage from the L breast, lateral trunk and axilla.    Baseline Max A    Time 4    Period Weeks    Status Partially Met   01/09/22: Pt has obtained bra and pad and is currently  working on building wear tolerance , starting w 4 hrs daytime and HOS without pad initially.   Target Date 03/07/22      OT LONG TERM GOAL #5   Title Pt will achieve and sustain a least 85% compliance with all 4 LE  self-care home program components throughout Intensive Phase CDT, including modified simple self-MLD, daily skin inspection and care, lymphatic pumping the ex and compression to sustain clinical gains made in CDT and to limit lymphedema progression and further functional decline.    Baseline Max A    Time 12    Period Weeks    Status Achieved    Target  Date 03/07/22      OT LONG TERM GOAL #6   Title Pt will be able to perform all lymphedema self-care home program components using correct techniques with extra time and assistive devices PRN (modified independence), including simple self MLD, lymphatic pumping exercise, don and doff appropriate compression garments/ devices and perform daily skin care regime to limit lymphedema progression and infection risk.    Baseline Max A    Time 12    Period Weeks    Status Achieved    Target Date 03/07/22                   Plan - 01/26/22 1051     Clinical Impression Statement Pt demonstrates excellent progress towards all OT goals for LE care. Comparative circumferential measurements at axillae, nipple line and inframammary fold reveal 4.3%, .9% and 3.4% decreased at landmarks . Pt reprts reduced sensory ndiscomfor in L breast and LUE with use. Pt has obtained all recommened compression wear including PrairieWear compression bra with Jovipak convoluted foam lumpectomy insert pad to assist with softening fibrosis and limiting progression of lymphatic fibrosis over time. Pt remains 100% compliant with LE self-care home program components and lymphedema / cellulitis precautions. Provided MLD and reviewed simple self MLD throughout session. Pt agrees with plan to return for follow up in 4-6 weeks. We'll cx scheduled visits during this interval.    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 use;Decreased knowledge of precautions;Decreased knowledge of use of DME;IADL;Skin integrity;Pain;Edema;ROM    Rehab Potential Good    Clinical Decision Making Limited treatment options, no task modification necessary    Comorbidities Affecting Occupational Performance: May have comorbidities impacting occupational performance    Comorbidities impacting occupational performance description: hx L axillary seroma st surgical site with cellulitis    Modification or Assistance to Complete Evaluation  No modification of tasks or assist necessary to complete eval    OT Frequency Other (comment)   F/U in 4-6 weeks. Modified Intensive Phase CDT completed this date.   OT Duration 12 weeks   and PRN   OT Treatment/Interventions Self-care/ADL training;Therapeutic exercise;DME and/or AE instruction;Other (comment);Manual Therapy;Scar mobilization;Coping strategies training;Patient/family education;Therapeutic activities;Manual lymph drainage   breast and trunk compression-alternative   Plan Complete Decongestive Therapy to include MLD, skin care, therapeutic exercise, compression. Consider fitting Pt with compression bra, convoluted foam insert for HOS to reduce fibrosis. Consider kinesiotape, Consider advanced sequential pneumatic compression device (Flexitouch),    Consulted and Agree with Plan of Care Patient             Patient will benefit from skilled therapeutic intervention in order to improve the following deficits and impairments:   Body Structure / Function / Physical Skills: UE functional use, Decreased knowledge of precautions, Decreased knowledge of use of DME, IADL, Skin integrity, Pain, Edema, ROM       Visit Diagnosis: Post-mastectomy lymphedema syndrome    Problem List Patient Active Problem List   Diagnosis Date Noted    Goals of care, counseling/discussion 03/10/2020   Bilateral malignant neoplasm of breast in female [facility]) 03/10/2020   Hx of adenomatous colonic polyps 04/29/2018   Hyperlipidemia, mild 01/02/2016   Anemia 07/06/2014   Asthma 07/06/2014   Environmental allergies  07/06/2014   GERD (gastroesophageal reflux disease) 07/06/2014   History of recurrent UTIs 07/06/2014   Osteoporosis 07/06/2014   Screening for cervical cancer 09/25/2011   History of breast cancer 09/25/2011    Andrey Spearman, MS, OTR/L, [facility] 01/26/22 11:04 AM   [place] MAIN [facility] SERVICES [address] [place], [place], [address] Phone: [phone]   Fax:  [phone]  Name: Marissa Phillips MRN: [id number] Date of Birth: 03/10/1961

## 2022-01-29 ENCOUNTER — Ambulatory Visit: Payer: 59 | Admitting: Occupational Therapy

## 2022-02-01 ENCOUNTER — Encounter: Payer: 59 | Admitting: Occupational Therapy

## 2022-02-05 ENCOUNTER — Encounter: Payer: 59 | Admitting: Occupational Therapy

## 2022-02-08 ENCOUNTER — Encounter: Payer: 59 | Admitting: Occupational Therapy

## 2022-02-12 ENCOUNTER — Encounter: Payer: 59 | Admitting: Occupational Therapy

## 2022-02-14 ENCOUNTER — Other Ambulatory Visit: Payer: Self-pay

## 2022-02-14 ENCOUNTER — Ambulatory Visit
Admission: RE | Admit: 2022-02-14 | Discharge: 2022-02-14 | Disposition: A | Discharge status: Home / Self Care | Payer: 59 | Source: Ambulatory Visit | Attending: Oncology | Admitting: Oncology

## 2022-02-14 DIAGNOSIS — R922 Inconclusive mammogram: Secondary | ICD-10-CM | POA: Diagnosis not present

## 2022-02-14 DIAGNOSIS — Z08 Encounter for follow-up examination after completed treatment for malignant neoplasm: Secondary | ICD-10-CM | POA: Diagnosis not present

## 2022-02-14 DIAGNOSIS — C50911 Malignant neoplasm of unspecified site of right female breast: Secondary | ICD-10-CM

## 2022-02-14 DIAGNOSIS — C50912 Malignant neoplasm of unspecified site of left female breast: Secondary | ICD-10-CM | POA: Insufficient documentation

## 2022-02-14 DIAGNOSIS — Z853 Personal history of malignant neoplasm of breast: Secondary | ICD-10-CM | POA: Insufficient documentation

## 2022-02-14 IMAGING — MG DIGITAL DIAGNOSTIC BILAT W/ TOMO W/ CAD
8 of 15 series · 8 of 40 positions shown · non-contrast
Comparison: Previous exam(s).

CLINICAL DATA: 60-year-old female status post bilateral
lumpectomies. History of right breast cancer in [A4] in [A4].
History of left breast cancer in [A4].

EXAM:
DIGITAL DIAGNOSTIC BILATERAL MAMMOGRAM WITH TOMOSYNTHESIS AND CAD
TECHNIQUE: Bilateral digital diagnostic mammography and breast tomosynthesis
was performed. The images were evaluated with computer-aided
detection.

[L MLO]
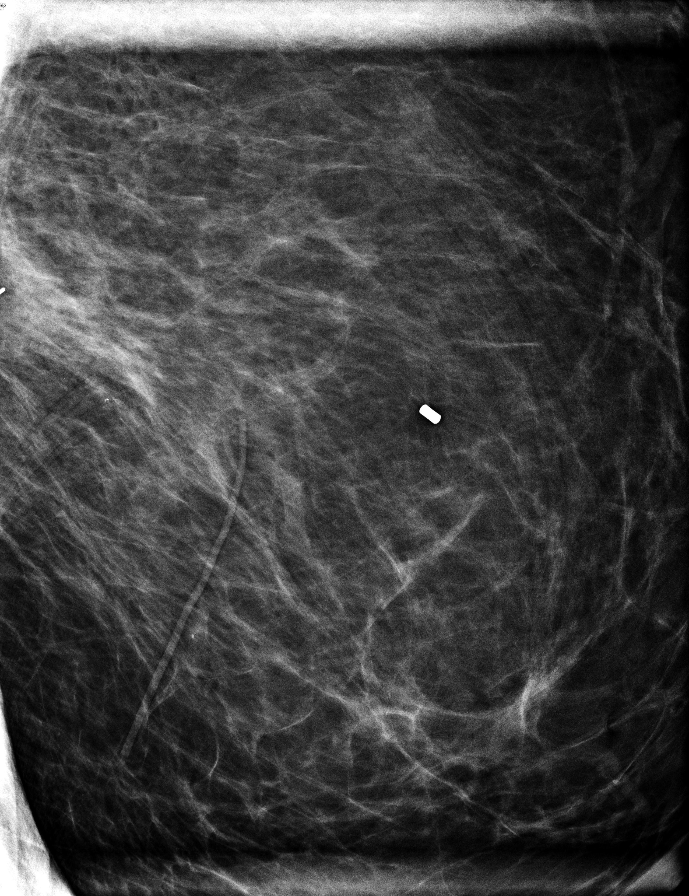

[R MLO]
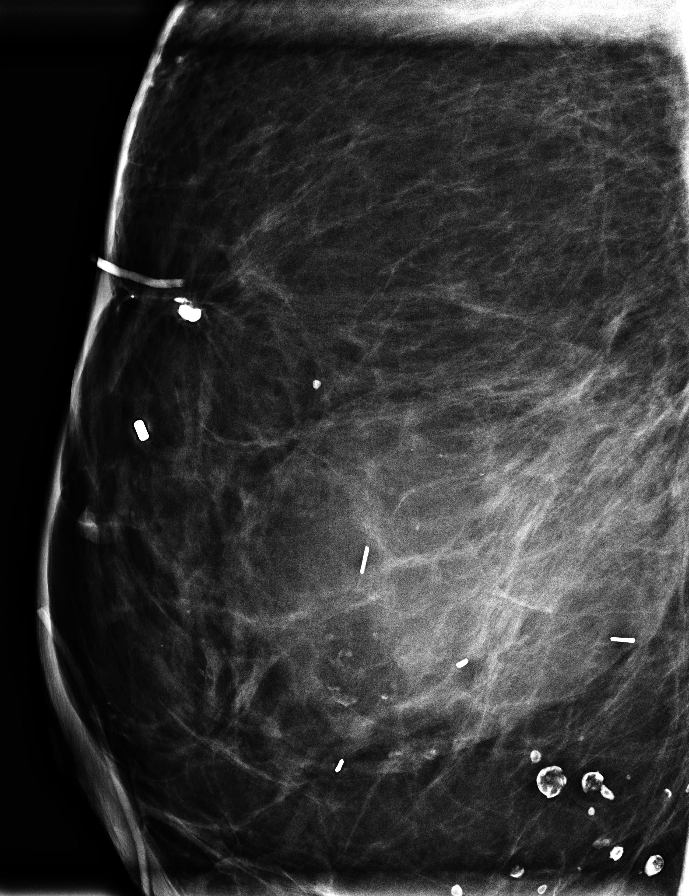

[L MLO synth-2D (1 of 2)]
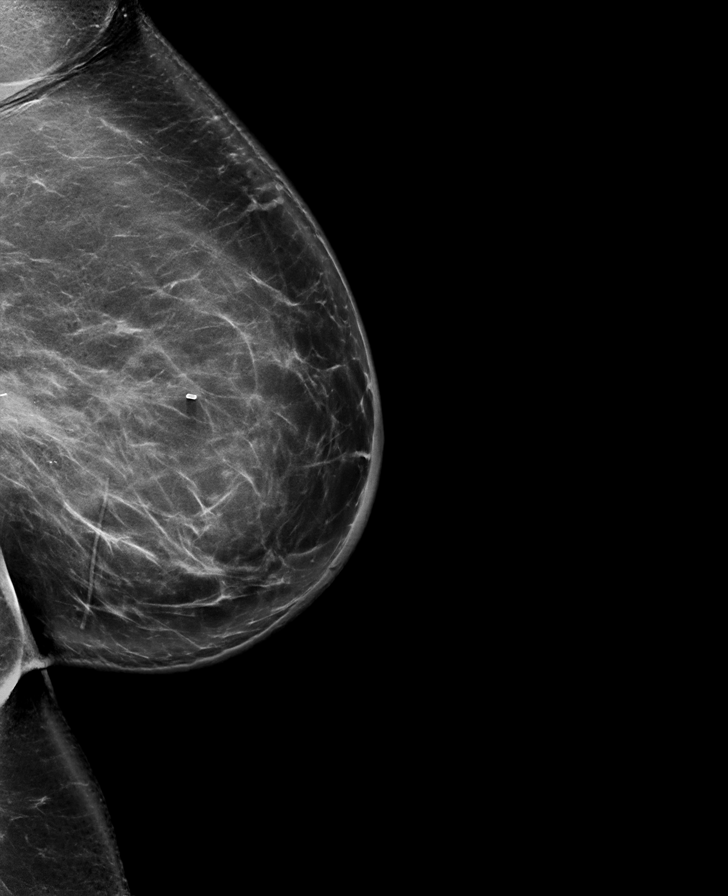

[L MLO synth-2D (2 of 2)]
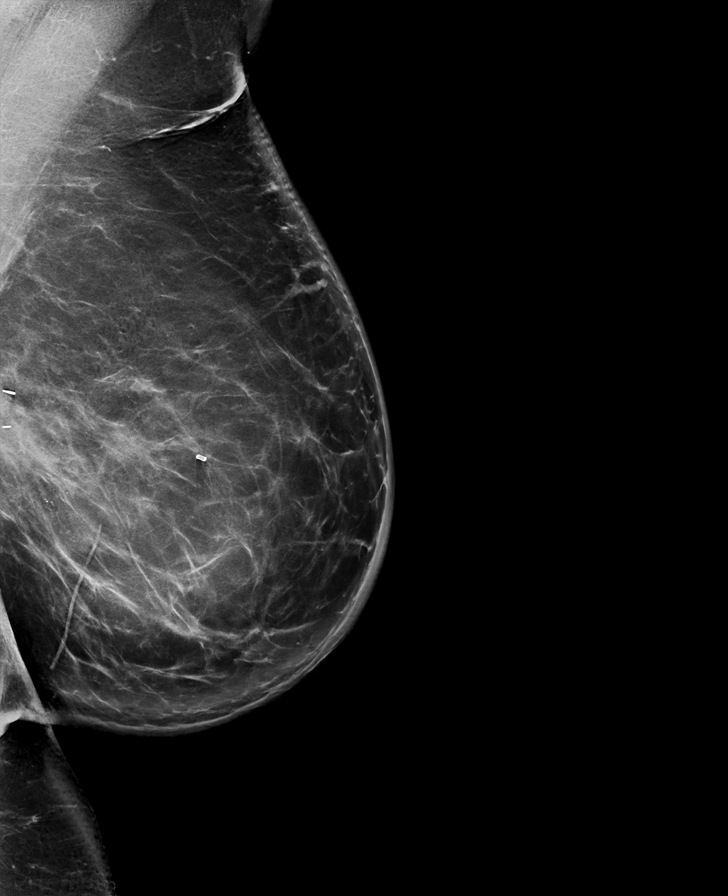

[L XCCL synth-2D]
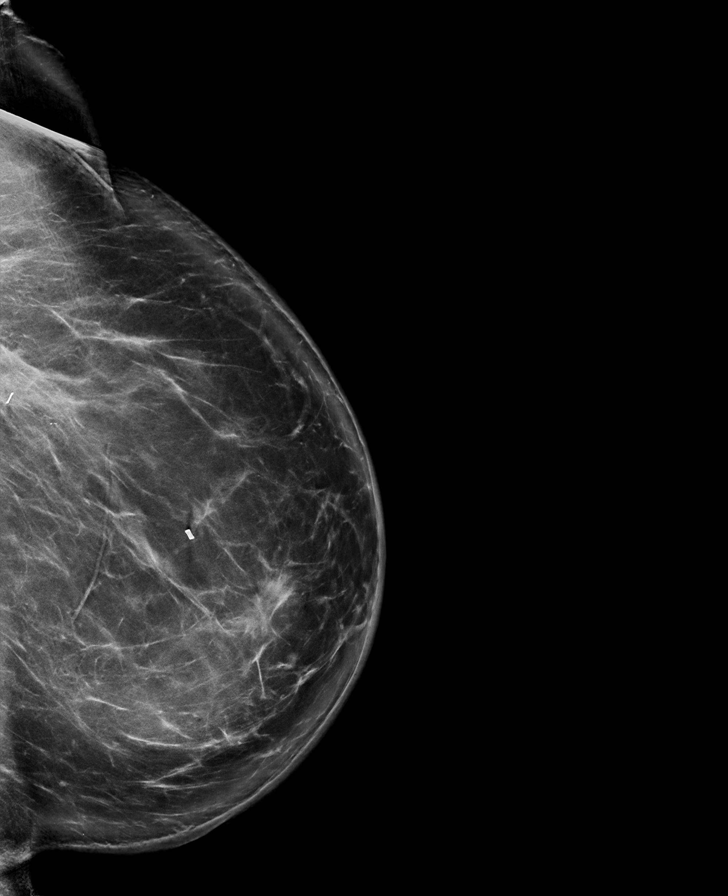

[R MLO synth-2D (1 of 2)]
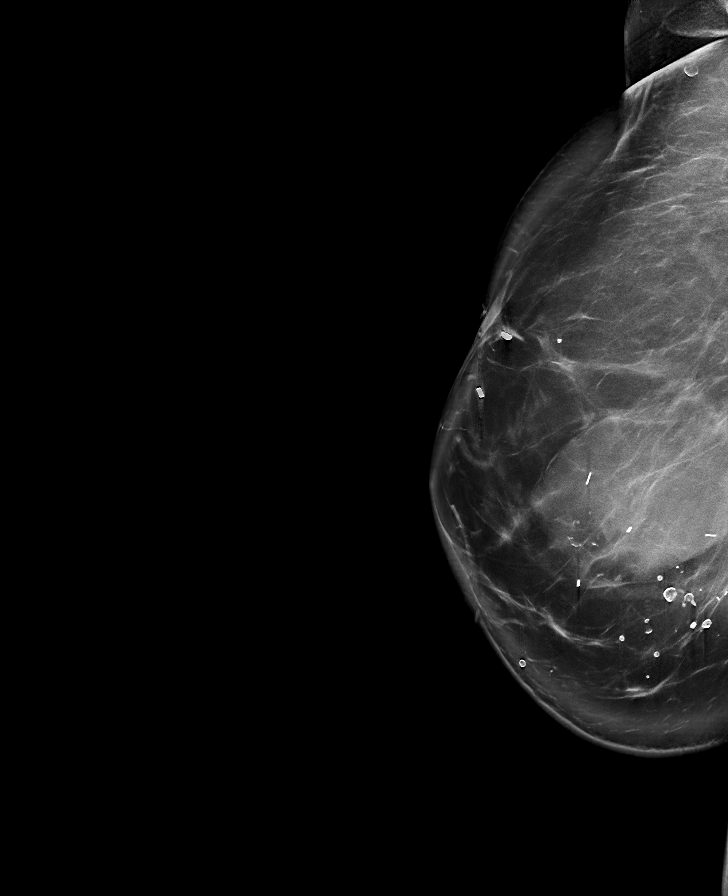

[L CC synth-2D]
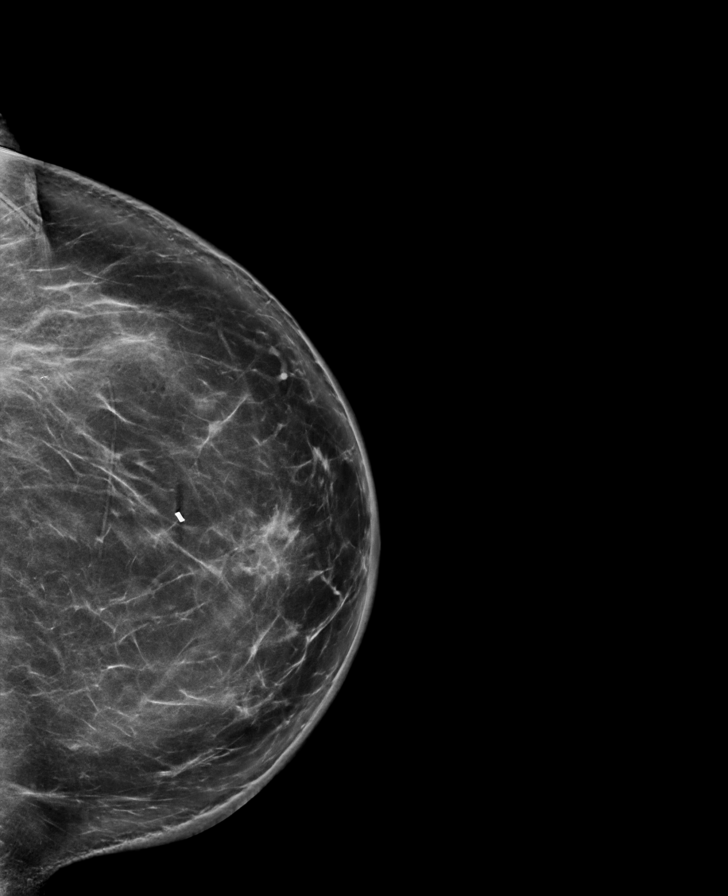

[R MLO synth-2D (2 of 2)]
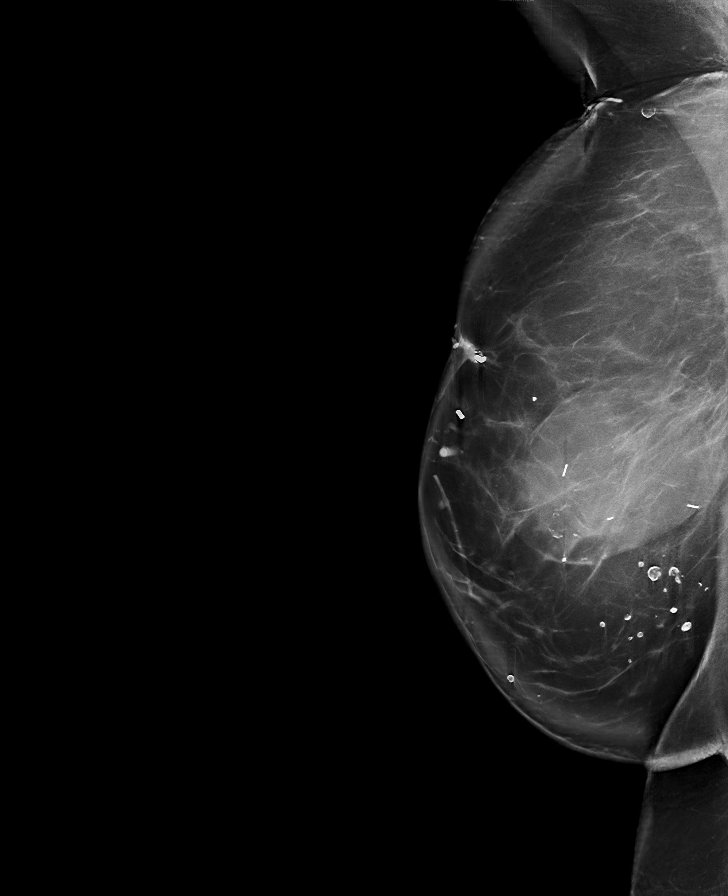

[8 of 40 positions shown; findings below may reference images not displayed]

ACR Breast Density Category b: There are scattered areas of
fibroglandular density.
FINDINGS: Post lumpectomy changes again identified within the bilateral
breasts. Note is made of a postoperative seroma on the right.
Otherwise, no new or suspicious findings.
IMPRESSION: 1. No mammographic evidence of malignancy in either breast.
2. Bilateral posttreatment changes.

RECOMMENDATION:
Per protocol, as the patient is now 2 or more years status post
lumpectomy, she may return to annual screening mammography in 1
year. However, given the history of breast cancer, the patient
remains eligible for annual diagnostic mammography if preferred.
(Code:[A4])

I have discussed the findings and recommendations with the patient.
If applicable, a reminder letter will be sent to the patient
regarding the next appointment.

BI-RADS CATEGORY  2: Benign.

## 2022-02-15 ENCOUNTER — Encounter: Payer: 59 | Admitting: Occupational Therapy

## 2022-02-19 ENCOUNTER — Encounter: Payer: 59 | Admitting: Occupational Therapy

## 2022-02-22 ENCOUNTER — Encounter: Payer: 59 | Admitting: Occupational Therapy

## 2022-02-26 ENCOUNTER — Other Ambulatory Visit: Payer: Self-pay

## 2022-02-26 ENCOUNTER — Encounter: Payer: 59 | Admitting: Occupational Therapy

## 2022-02-28 ENCOUNTER — Encounter: Payer: Self-pay | Admitting: Occupational Therapy

## 2022-02-28 ENCOUNTER — Ambulatory Visit: Payer: 59 | Attending: General Surgery | Admitting: Occupational Therapy

## 2022-02-28 DIAGNOSIS — I972 Postmastectomy lymphedema syndrome: Secondary | ICD-10-CM | POA: Insufficient documentation

## 2022-02-28 NOTE — Therapy (Signed)
[place] ?[place] [facility] REH[facility] SERVICES ?[place], [place], [address] ?Phone: [phone]   Fax:  [phone] ? ?Occupational Therapy Treatment ? ?Patient Details  ?Name: Marissa Phillips ?MRN: [id number] ?Date of Birth: [birth year] ?Referring Provider (OT): Thea Silversmith, MD-[facility] ? ? ?Encounter Date: 02/28/2022 ? ? OT End of Session - 02/28/22 1120   ? ? Visit Number 11   ? Number of Visits 36   ? Date for OT Re-Evaluation 03/07/22   ? OT Start Time 0900   ? OT Stop Time 1004   ? OT Time Calculation (min) 64 min   ? Activity Tolerance Patient tolerated treatment well;No increased pain   ? Behavior During Therapy [facility] for tasks assessed/performed   ? ?  ?  ? ?  ? ? ?Past Medical History:  ?Diagnosis Date  ? Asthma   ? Breast cancer ([place]) 2005  ? Cancer [facility])   ? Breast- Right-2005  ? Osteopenia   ? Personal history of chemotherapy   ? Personal history of radiation therapy   ? PONV (postoperative nausea and vomiting)   ? ? ?Past Surgical History:  ?Procedure Laterality Date  ? BREAST BIOPSY Right 2005  ? +  ? BREAST BIOPSY Right 03/01/2020  ? [place] bx of mass at 2:00 heart marker,  INVASIVE MAMMARY CARCINOMA, NO  ? BREAST BIOPSY Left 03/01/2020  ? [place] bx mass at 3:00, coil marker,  INVASIVE MAMMARY CARCINOMA  ? BREAST BIOPSY Right 03/01/2020  ? [place] bx of right axilla, no marker placed,BENIGN LYMPH NODE.  ? BREAST LUMPECTOMY Right 2005  ? BREAST LUMPECTOMY WITH NEEDLE LOCALIZATION AND AXILLARY SENTINEL LYMPH NODE BX Right 2005  ? BREAST LUMPECTOMY WITH RADIOACTIVE SEED AND SENTINEL LYMPH NODE BIOPSY Bilateral 04/14/2020  ? Procedure: BILATERAL RADIOACTIVE SEED GUIDED BREAST LUMPECTOMIES, BILATERAL AXILLARY SENTINEL LYMPH NODE BIOPSIES, WITH BLUE DYE INJECTION RIGHT BREAST;  Surgeon: Alphonsa Overall, MD;  Location: [place];  Service: General;  Laterality: Bilateral;  ? BREAST SURGERY    ? COLONOSCOPY WITH PROPOFOL N/A 04/02/2017  ? Procedure: COLONOSCOPY WITH PROPOFOL;  Surgeon: Jonathon Bellows, MD;  Location: [facility] ENDOSCOPY;  Service: Endoscopy;  Laterality: N/A;  ? ESOPHAGOGASTRODUODENOSCOPY (EGD) WITH PROPOFOL N/A 04/02/2017  ? Procedure: ESOPHAGOGASTRODUODENOSCOPY (EGD) WITH PROPOFOL;  Surgeon: Jonathon Bellows, MD;  Location: [facility] ENDOSCOPY;  Service: Endoscopy;  Laterality: N/A;  ? TONSILLECTOMY  ~2000  ? ? ?There were no vitals filed for this visit. ? ? Subjective Assessment - 02/28/22 1121   ? ? Subjective  Marissa Phillips presents for OT visit  11/36 for follow-along care of  BUE/BUQ lymphedema ( emphasis on LUQ). She denies LE realted pain today. Pt wears newly purchased lymphedema compression bra , Praire Wear, to clinic. She tells me she wears compression bra with convoluted foam post-lumpectomy pad at home daily. She states lateral inframammary fibrosis is a little less dense. She admits she doesn't always perform lymphatic therex and self-MLD at recommended frequency. She reports her doctor discovered a R breast seroma, but there are no plans to aspirate it. She states she's interested in further exploring the Flexitouch for  LE self-management at home.   ? Pertinent History S/p ER+ R br ca in 2005 w/ XRT and hormone therapy. SLNB? # of LN?Marland Kitchen Dx w B br ca 8/2021S/P B lumpectomies 04/14/20. S/P partial R breast XRT and and full br XRT on L. S/p SLND. # LN?. Pt reports seroma L axilla s/p 2021 sx with infection. Arimadex for 5  years and currently on Aromasin with good tolerance. Reported symptoms of AWS have resolved.   ? Limitations chronmic breast swelling and associated pain/discomfort. Impaired L shoulder internal rotation-pain at end range when dressing; difficulty sleeping;   ? Repetition Increases Symptoms   ? Special Tests Intake FOTO score :92/100   ? Patient Stated Goals reduce breast swelling and pain. limit infection risk   ? Currently in Pain? No/denies   ? Pain Onset --   concurrent w/ XRT in 2021  ? ?  ?  ? ?  ? ? ? ? ? ? LYMPHEDEMA/ONCOLOGY QUESTIONNAIRE - 02/28/22 1129   ? ?  ?  Right Upper Extremity Lymphedema  ? Other LUE( dominant/ Rx) volume measures 2453.6 ml. LUE is decreased in volume since initially measured by 12.36%.   ? Other Circ at axilla: 95.0 cm- decreased by 4% since initial eval 11/2021. Circ at largest chest + 115 cm- decreased .86% since initial eval. Circ at inframammary fold 99.4 cm- down 4% overall.   ? ?  ?  ? ?  ? ? ? ? ? ? ? ? ? ? OT Treatments/Exercises (OP) - 02/28/22 1128   ? ?  ? ADLs  ? ADL Education Given Yes   ?  ? Manual Therapy  ? Manual Therapy Edema management   ? Edema Management comparative lBUE imb volumetrics, and chest/ trunkal circumferential measurements   ? ?  ?  ? ?  ? ? ? ? ? ? ? ? ? OT Education - 02/28/22 1136   ? ? Education Details Continued Pt/ CG edu for lymphedema self care  and home program throughout session. Topics include multilayer, gradient compression wrapping, simple self-MLD, therapeutic lymphatic pumping exercises, skin/nail care, risk reduction factors and LE precautions, compression garments/recommendations and wear and care schedule and compression garment donning / doffing using assistive devices. All questions answered to the Pt's satisfaction, and Pt demonstrates understanding by report.   ? Person(s) Educated Patient   ? Methods Explanation;Demonstration;Handout   ? Comprehension Verbalized understanding;Returned demonstration   ? ?  ?  ? ?  ? ? ? ? ? ? OT Long Term Goals - 02/28/22 1137   ? ?  ? OT LONG TERM GOAL #1  ? Title Given this patient?s Intake score of 92/100 on the functional outcomes FOTO tool, patient will experience an increase in function of 5 points to improve basic and instrumental ADLs performance, including lymphedema self-care.   ? Baseline 92/100   ? Time 12   ? Period Weeks   ? Status Achieved   Goal achieved with 98/100 on 10th visit, 01/26/22  ?  ? OT LONG TERM GOAL #2  ? Title Pt will demonstrate understanding of lymphedema prevention strategies by identifying signs and symptoms of cellulitis  infection and by naming/ discussing 3 prevention strategies using printed reference (modified assistance) to reduce risk of progression and to limit infection risk.   ? Baseline Max A   ? Time 4   ? Period Days   ? Status Achieved   ? Target Date --   4th OT Rx visit  ?  ? OT LONG TERM GOAL #3  ? Title Pt will achieve at least a 10% volume reduction in the L breast to return it to more typical  size and shape,  to limit lymphedema progression and to limit infection risk.   ? Baseline Measurements TBA at OT Rx visit 1   ? Time 12   ? Period Weeks   ?  Status Partially Met   Pt achieved circumferential reductions at all landmarks: 2.5%at axilla .9% at largest chest, & 4.1% at inframammary fold circ. LUE (dominant/Rx) is decreased in volume by 12.4% and RUE is dec in volume by 12.37%. Limb volume differential= .67%, L>R, WNL  ? Target Date 03/07/22   ?  ? OT LONG TERM GOAL #4  ? Title Pt will obtain recommended compression bra and convoluted foam pad insert, and tolerate full-time wear schedule within 7 days to reduce existing tissue fibrosis and to facilitate improved lymphatic drainage from the L breast, lateral trunk and axilla.   ? Baseline Max A   ? Time 4   ? Period Weeks   ? Status Achieved   01/09/22: Pt has obtained bra and pad and is currently  working on building wear tolerance , starting w 4 hrs daytime and HOS without pad initially.  ?  ? OT LONG TERM GOAL #5  ? Title Pt will achieve and sustain a least 85% compliance with all 4 LE self-care home program components throughout Intensive Phase CDT, including modified simple self-MLD, daily skin inspection and care, lymphatic pumping the ex and compression to sustain clinical gains made in CDT and to limit lymphedema progression and further functional decline.   ? Baseline Max A   ? Time 12   ? Period Weeks   ? Status Achieved   ? Target Date 03/07/22   ?  ? OT LONG TERM GOAL #6  ? Title Pt will be able to perform all lymphedema self-care home program  components using correct techniques with extra time and assistive devices PRN (modified independence), including simple self MLD, lymphatic pumping exercise, don and doff appropriate compression garments/ devices and pe

## 2022-03-05 ENCOUNTER — Encounter: Payer: 59 | Admitting: Occupational Therapy

## 2022-03-07 ENCOUNTER — Encounter: Payer: 59 | Admitting: Occupational Therapy

## 2022-03-10 ENCOUNTER — Telehealth: Payer: 59 | Admitting: Physician Assistant

## 2022-03-10 DIAGNOSIS — J0110 Acute frontal sinusitis, unspecified: Secondary | ICD-10-CM | POA: Diagnosis not present

## 2022-03-10 MED ORDER — FLUTICASONE PROPIONATE 50 MCG/ACT NA SUSP: 2.0000 | Freq: Every day | NASAL | 6 refills | Status: DC

## 2022-03-10 NOTE — Progress Notes (Signed)
E-Visit for Sinus Problems  We are sorry that you are not feeling well.  Here is how we plan to help!  Based on what you have shared with me it looks like you have sinusitis.  Sinusitis is inflammation and infection in the sinus cavities of the head.  Based on your presentation I believe you most likely have Acute Viral Sinusitis.This is an infection most likely caused by a virus. There is not specific treatment for viral sinusitis other than to help you with the symptoms until the infection runs its course.  You may use an oral decongestant such as Mucinex D or if you have glaucoma or high blood pressure use plain Mucinex. Saline nasal spray help and can safely be used as often as needed for congestion, I have prescribed: Fluticasone nasal spray two sprays in each nostril once a day  Some authorities believe that zinc sprays or the use of Echinacea may shorten the course of your symptoms.  Sinus infections are not as easily transmitted as other respiratory infection, however we still recommend that you avoid close contact with loved ones, especially the very young and elderly.  Remember to wash your hands thoroughly throughout the day as this is the number one way to prevent the spread of infection!  Home Care: Only take medications as instructed by your medical team. Do not take these medications with alcohol. A steam or ultrasonic humidifier can help congestion.  You can place a towel over your head and breathe in the steam from hot water coming from a faucet. Avoid close contacts especially the very young and the elderly. Cover your mouth when you cough or sneeze. Always remember to wash your hands.  Get Help Right Away If: You develop worsening fever or sinus pain. You develop a severe head ache or visual changes. Your symptoms persist after you have completed your treatment plan.  Make sure you Understand these instructions. Will watch your condition. Will get help right away if you  are not doing well or get worse.   Thank you for choosing an e-visit.  Your e-visit answers were reviewed by a board certified advanced clinical practitioner to complete your personal care plan. Depending upon the condition, your plan could have included both over the counter or prescription medications.  Please review your pharmacy choice. Make sure the pharmacy is open so you can pick up prescription now. If there is a problem, you may contact your provider through MyChart messaging and have the prescription routed to another pharmacy.  Your safety is important to us. If you have drug allergies check your prescription carefully.   For the next 24 hours you can use MyChart to ask questions about today's visit, request a non-urgent call back, or ask for a work or school excuse. You will get an email in the next two days asking about your experience. I hope that your e-visit has been valuable and will speed your recovery.  I have spent 5 minutes in review of e-visit questionnaire, review and updating patient chart, medical decision making and response to patient.   Keena Dinse S Mayers, PA-C      

## 2022-03-12 ENCOUNTER — Encounter: Payer: 59 | Admitting: Occupational Therapy

## 2022-03-15 ENCOUNTER — Encounter: Payer: 59 | Admitting: Occupational Therapy

## 2022-03-19 ENCOUNTER — Encounter: Payer: 59 | Admitting: Occupational Therapy

## 2022-03-22 ENCOUNTER — Encounter: Payer: 59 | Admitting: Occupational Therapy

## 2022-03-26 ENCOUNTER — Encounter: Payer: 59 | Admitting: Occupational Therapy

## 2022-03-28 ENCOUNTER — Other Ambulatory Visit: Payer: Self-pay

## 2022-03-29 ENCOUNTER — Encounter: Payer: 59 | Admitting: Occupational Therapy

## 2022-04-03 ENCOUNTER — Other Ambulatory Visit: Payer: Self-pay

## 2022-04-03 ENCOUNTER — Telehealth: Payer: 59 | Admitting: Family Medicine

## 2022-04-03 DIAGNOSIS — R300 Dysuria: Secondary | ICD-10-CM | POA: Diagnosis not present

## 2022-04-03 MED ORDER — NITROFURANTOIN MONOHYD MACRO 100 MG PO CAPS
100.0000 mg | ORAL_CAPSULE | Freq: Two times a day (BID) | ORAL | 0 refills | Status: AC
  Filled 2022-04-03: qty 10, 5d supply, fill #0

## 2022-04-03 NOTE — Progress Notes (Signed)

## 2022-04-09 ENCOUNTER — Telehealth: Payer: Self-pay | Admitting: Medical Oncology

## 2022-04-09 NOTE — Telephone Encounter (Signed)
WF [id number] Understanding and Predicting Breast Cancer Events after Treatment (UPBEAT)  ? ?Outgoing call ? ?LVMOM with patient regarding her 24 month on study assessment is approaching. Informed patient that study cardiac MRI and physical assessment appointment need to be scheduled. Inquired with patient available days for me to be able to schedule the cardiac MRI. Patient was asked to return call at earliest convenience. Call back number provided. Patient thanked.  ? ?Maxwell Marion, RN, BSN, [place] ?Clinical Research Nurse Lead ?04/09/2022 3:46 PM ?  ?

## 2022-04-10 ENCOUNTER — Other Ambulatory Visit (HOSPITAL_COMMUNITY): Payer: Self-pay | Admitting: Oncology

## 2022-04-10 DIAGNOSIS — C50919 Malignant neoplasm of unspecified site of unspecified female breast: Secondary | ICD-10-CM

## 2022-04-12 ENCOUNTER — Telehealth: Payer: Self-pay | Admitting: Medical Oncology

## 2022-04-12 NOTE — Telephone Encounter (Signed)
WF [id number] Understanding and Predicting Breast Cancer Events after Treatment (UPBEAT)   Outgoing call: 24 month study assessments I spoke with patient and thanked her for returning my call and providing me with available days to complete the study cardiac MRI. Patient informed of cardiac MRI appointment, which has been scheduled for July 12th @ 10 AM, patient knows to arrive at 0930 at [facility] for this appointment. I also inquired with patient of completing  the remaining assessments before her appointment with Dr. Janese Banks on July 31st. Informed patient that she will have study research labs collected prior to her MD appointment and prior to her assessment with me, patient stated that would work for her. Patient confirmed that she had completed the study questionnaires online in the past. I informed patient that I will have those sent to her email closer to her July appointment. Patient gave verbal understanding and denies questions at this time. Patient thanked and encouraged to call in the meantime.  Patient to be scheduled for study research labs @ 0945 AM with research appointment to follow, prior to her 1115 appt with Dr. Janese Banks.  Maxwell Marion, RN, BSN, [place] Clinical Research Nurse Lead 04/12/2022 3:52 PM

## 2022-04-18 ENCOUNTER — Telehealth: Payer: Self-pay | Admitting: Medical Oncology

## 2022-04-18 NOTE — Telephone Encounter (Addendum)
WF [id number] Understanding and Predicting Breast Cancer Events after Treatment (UPBEAT)   Incoming call: reschedule MRI appt. Patient called to inform me that she needs to reschedule her study cardiac MRI appt for another day. Patient states the week before or the week after, any time of day works for her. Informed patient that I will have it rescheduled and call her back with new time. Patient thanked for call.  Maxwell Marion, RN, BSN, [place] Clinical Research Nurse Lead 04/18/2022 2:42 PM  Spoke with patient to let her know of her rescheduled MRI appt 05/30/22 @ 1300 at [facility]. Patient aware to arrive 30 minutes prior to appt to register.  [facility]  Direct Dial: [phone] Fax: [phone]   04/25/2022 12:19 PM

## 2022-04-27 ENCOUNTER — Other Ambulatory Visit: Payer: Self-pay

## 2022-04-27 ENCOUNTER — Other Ambulatory Visit: Payer: Self-pay | Admitting: Oncology

## 2022-04-27 MED ORDER — LETROZOLE 2.5 MG PO TABS
2.5000 mg | ORAL_TABLET | Freq: Every day | ORAL | 1 refills | Status: DC
  Filled 2022-04-27: qty 90, 90d supply, fill #0
  Filled 2022-07-31: qty 90, 90d supply, fill #1

## 2022-05-23 DIAGNOSIS — Z86010 Personal history of colonic polyps: Secondary | ICD-10-CM | POA: Diagnosis not present

## 2022-05-30 ENCOUNTER — Ambulatory Visit (HOSPITAL_COMMUNITY)
Admission: RE | Admit: 2022-05-30 | Discharge: 2022-05-30 | Disposition: A | Discharge status: Home / Self Care | Payer: 59 | Source: Ambulatory Visit | Attending: Oncology | Admitting: Oncology

## 2022-05-30 DIAGNOSIS — C50919 Malignant neoplasm of unspecified site of unspecified female breast: Secondary | ICD-10-CM | POA: Insufficient documentation

## 2022-06-06 ENCOUNTER — Other Ambulatory Visit (HOSPITAL_COMMUNITY): Payer: 59

## 2022-06-15 ENCOUNTER — Telehealth: Payer: Self-pay | Admitting: Medical Oncology

## 2022-06-15 NOTE — Telephone Encounter (Signed)
WF [id number] Understanding and Predicting Breast Cancer Events after Treatment (UPBEAT)   Outgoing call: [phone]  I received an email from patient this morning informing me, that despite our best efforts, she received a bill for the study related cardiac MRI she had done on 05/30/2022. Doreatha Martin, System Wide Manager informed and reached out to billing to correct. Confirmation received that charges were removed from patient's account.  I spoke with patient and apologized to her for any inconvenience caused and informed her that the charges have been removed.  Patient thanked for her patience and encouraged to call, should she have any further questions.   Maxwell Marion, RN, BSN, [place] [facility] Clinical Research Nurse Lead 06/15/2022 1:20 PM

## 2022-06-19 ENCOUNTER — Ambulatory Visit
Admission: RE | Admit: 2022-06-19 | Discharge: 2022-06-19 | Disposition: A | Discharge status: Home / Self Care | Payer: 59 | Source: Ambulatory Visit | Attending: Oncology | Admitting: Oncology

## 2022-06-19 DIAGNOSIS — Z853 Personal history of malignant neoplasm of breast: Secondary | ICD-10-CM | POA: Insufficient documentation

## 2022-06-19 DIAGNOSIS — Z08 Encounter for follow-up examination after completed treatment for malignant neoplasm: Secondary | ICD-10-CM | POA: Diagnosis not present

## 2022-06-19 DIAGNOSIS — M85852 Other specified disorders of bone density and structure, left thigh: Secondary | ICD-10-CM | POA: Diagnosis not present

## 2022-06-22 ENCOUNTER — Telehealth: Payer: Self-pay | Admitting: Medical Oncology

## 2022-06-22 ENCOUNTER — Other Ambulatory Visit: Payer: Self-pay | Admitting: Medical Oncology

## 2022-06-22 DIAGNOSIS — C50911 Malignant neoplasm of unspecified site of right female breast: Secondary | ICD-10-CM

## 2022-06-22 NOTE — Telephone Encounter (Signed)
WF [id number] Understanding and Predicting Breast Cancer Events after Treatment (UPBEAT)   Outgoing call: [phone]  Spoke with patient and confirmed her appointment on Monday the 31st of July, with research, starting at 0945 for labs and study assessments to follow. Patient was reminded to fast for three hours prior to her lab appointment and to wear comfortable clothes and shoes for the Physical Performance assessment. Patient gave her verbal understanding. Patient inquired about the study questionnaires, usually sent to her e-mail. Patient was informed that those should have been sent to her today. Patient thanked for her time and was encouraged to call with questions in the meantime.   Maxwell Marion, RN, BSN, [place] Clinical Research Nurse Lead 06/22/2022 2:41 PM

## 2022-06-23 ENCOUNTER — Telehealth: Payer: Self-pay | Admitting: Urgent Care

## 2022-06-23 DIAGNOSIS — N3000 Acute cystitis without hematuria: Secondary | ICD-10-CM

## 2022-06-23 MED ORDER — NITROFURANTOIN MONOHYD MACRO 100 MG PO CAPS: 100.0000 mg | ORAL_CAPSULE | Freq: Two times a day (BID) | ORAL | 0 refills | Status: AC

## 2022-06-23 NOTE — Progress Notes (Signed)
E-Visit for Urinary Problems  We are sorry that you are not feeling well.  Here is how we plan to help!  Based on what you shared with me it looks like you most likely have a simple urinary tract infection.  A UTI (Urinary Tract Infection) is a bacterial infection of the bladder.  Most cases of urinary tract infections are simple to treat but a key part of your care is to encourage you to drink plenty of fluids and watch your symptoms carefully.  I have prescribed MacroBid 100 mg twice a day for 5 days.  Your symptoms should gradually improve. Call us if the burning in your urine worsens, you develop worsening fever, back pain or pelvic pain or if your symptoms do not resolve after completing the antibiotic.  Urinary tract infections can be prevented by drinking plenty of water to keep your body hydrated.  Also be sure when you wipe, wipe from front to back and don't hold it in!  If possible, empty your bladder every 4 hours.  HOME CARE Drink plenty of fluids Compete the full course of the antibiotics even if the symptoms resolve Remember, when you need to go.go. Holding in your urine can increase the likelihood of getting a UTI! GET HELP RIGHT AWAY IF: You cannot urinate You get a high fever Worsening back pain occurs You see blood in your urine You feel sick to your stomach or throw up You feel like you are going to pass out  MAKE SURE YOU  Understand these instructions. Will watch your condition. Will get help right away if you are not doing well or get worse.   Thank you for choosing an e-visit.  Your e-visit answers were reviewed by a board certified advanced clinical practitioner to complete your personal care plan. Depending upon the condition, your plan could have included both over the counter or prescription medications.  Please review your pharmacy choice. Make sure the pharmacy is open so you can pick up prescription now. If there is a problem, you may contact your  provider through MyChart messaging and have the prescription routed to another pharmacy.  Your safety is important to us. If you have drug allergies check your prescription carefully.   For the next 24 hours you can use MyChart to ask questions about today's visit, request a non-urgent call back, or ask for a work or school excuse. You will get an email in the next two days asking about your experience. I hope that your e-visit has been valuable and will speed your recovery.   I have spent 5 minutes in review of e-visit questionnaire, review and updating patient chart, medical decision making and response to patient.   Renleigh Ouellet L Zita Ozimek, PA    

## 2022-06-25 ENCOUNTER — Inpatient Hospital Stay: Payer: 59

## 2022-06-25 ENCOUNTER — Encounter: Payer: Self-pay | Admitting: Oncology

## 2022-06-25 ENCOUNTER — Telehealth: Payer: Self-pay | Admitting: Pharmacy Technician

## 2022-06-25 ENCOUNTER — Encounter: Payer: Self-pay | Admitting: Medical Oncology

## 2022-06-25 ENCOUNTER — Inpatient Hospital Stay: Payer: 59 | Attending: Oncology | Admitting: Oncology

## 2022-06-25 ENCOUNTER — Inpatient Hospital Stay: Payer: 59 | Admitting: Medical Oncology

## 2022-06-25 VITALS — BP 125/89 | HR 92 | Temp 97.6°F | Resp 18 | Wt 203.1 lb

## 2022-06-25 DIAGNOSIS — Z08 Encounter for follow-up examination after completed treatment for malignant neoplasm: Secondary | ICD-10-CM | POA: Diagnosis not present

## 2022-06-25 DIAGNOSIS — Z79811 Long term (current) use of aromatase inhibitors: Secondary | ICD-10-CM | POA: Diagnosis not present

## 2022-06-25 DIAGNOSIS — C50912 Malignant neoplasm of unspecified site of left female breast: Secondary | ICD-10-CM | POA: Insufficient documentation

## 2022-06-25 DIAGNOSIS — C50911 Malignant neoplasm of unspecified site of right female breast: Secondary | ICD-10-CM

## 2022-06-25 DIAGNOSIS — M8580 Other specified disorders of bone density and structure, unspecified site: Secondary | ICD-10-CM | POA: Insufficient documentation

## 2022-06-25 DIAGNOSIS — Z170 Estrogen receptor positive status [ER+]: Secondary | ICD-10-CM | POA: Diagnosis not present

## 2022-06-25 DIAGNOSIS — Z853 Personal history of malignant neoplasm of breast: Secondary | ICD-10-CM

## 2022-06-25 NOTE — Telephone Encounter (Signed)
called to clarity gift card amount

## 2022-06-25 NOTE — Research (Signed)
WF [id number] Understanding and Predicting Breast Cancer Events after Treatment (UPBEAT)     06/25/22 - 24 month visit  Vitals:  Vital signs including blood pressure, pulse, height, and weight were collected per protocol.  Waist circumference measured 44 inches.  1st set @ 10:11 AM vitals- 133/70, HR 83  2nd set @ 10:13 AM vitals- 122/95, HR 84 Weight - 202.9 lbs Height- 66 inches  Labs:   Required [facility] research labs for this visit were collected at 0945.  Patient confirms to have been fasting for more than 3 hours, prior to her lab draw.  Medication review:   The patient's medication list was reviewed and updated.  Cardiovascular events:   The cardiovascular events form was completed. Patient denies hospitalization or visit to the ER since last research visit on 07/14/2021.  She also denies an MI, PCI, CABG, Catheterization, Stroke, or Heart failure since last research visit.  Cardiac MRI:  Was completed on 05/30/2022  Neurocognitive testing: Neurocognitive function testing was performed by Research Specialist, Mauricio Po, according to protocol.  Physical Assessments: 6 minute walk, disability measures, and expanded SPPB were administered per protocol by this research nurse.    Gift Card: Study provided gift card was given to the patient by Mauricio Po for participation in today's required research assessments.    Questionnaires:  Patient completed required assessments in clinic and include the Self-Administered Questionnaire, Social Determinants of Health, COVID-19 Questionnaire, and KCCQ-12.   The patient was informed that next study assessments are in follow-up and will be done every 56-month via a telephone call and will ask questions regarding any new cardiac concerns, with the first follow up call approximately this time next year, 2024. Patient denies having any questions at this time.  I thanked patient for her time and continued support of study and encouraged  her to contact me with any questions she may have related to the study.   MMaxwell Marion RN, BSN, CLaf[facility] Specialty HospitalClinical Research Nurse Lead 06/25/2022 11:34 AM

## 2022-06-25 NOTE — Progress Notes (Signed)
Hematology/Oncology Consult note [facility]  Telephone:([phone] [phone]  Patient Care Team: Idelle Crouch, MD as PCP - General (Internal Medicine) Theodore Demark, RN (Inactive) as Oncology Nurse Navigator Noreene Filbert, MD as Radiation Oncologist (Radiation Oncology) Jeral Fruit, RN as Registered Nurse Sindy Guadeloupe, MD as Consulting Physician (Oncology)   Name of the patient: Marissa Phillips  [id number]  04-Sep-1961   Date of visit: 06/25/22  Diagnosis-history of bilateral breast cancer  Chief complaint/ Reason for visit-routine follow-up of breast cancer on letrozole  Heme/Onc history: Patient is a 60 year old female with history ofRight breast cancer in 2005 ER positive s/p lumpectomy and adjuvant radiation therapy and 5 years of endocrine therapy.  She was found to have bilateral breast masses based on her mammogram and 2018. Patient went for bilateral lumpectomy.  Right breast pathology showed 0.6 cm invasive ductal carcinoma grade 2 with negative margins and left breast showed 1.2 cm invasive lobular carcinoma.  1 sentinel lymph node on the left side was negative for malignancy.  Both these tumors were ER positive and HER-2 negative.  PR negative.   Oncotype testing was sent on both right and left breast specimens and both came back with an Oncotype score of 24 which puts her at intermediate risk for age.  She would not benefit from adjuvant chemotherapy.  She completed adjuvant radiation therapy and started Aromasin in September 2021 patient had problems tolerating Aromasin and was switched to letrozole in January 2023    Interval history-patient is tolerating letrozole well presently.  She does have on and off episodes of hot flashes which are gradually increasing in frequency almost every day but last for less than a minute.  She has mild bilateral lower extremity edema for which she is going to start using compression stockings.   She has had tendinitis involving her left leg in the past and her left leg does tend to swell upMore than the right as per patient.  Symptoms of left breast lymphedema are presently under good control with compression bra  ECOG PS- 1 Pain scale- 0   Review of systems- Review of Systems  Constitutional:  Negative for chills, fever, malaise/fatigue and weight loss.  HENT:  Negative for congestion, ear discharge and nosebleeds.   Eyes:  Negative for blurred vision.  Respiratory:  Negative for cough, hemoptysis, sputum production, shortness of breath and wheezing.   Cardiovascular:  Positive for leg swelling. Negative for chest pain, palpitations, orthopnea and claudication.  Gastrointestinal:  Negative for abdominal pain, blood in stool, constipation, diarrhea, heartburn, melena, nausea and vomiting.  Genitourinary:  Negative for dysuria, flank pain, frequency, hematuria and urgency.  Musculoskeletal:  Negative for back pain, joint pain and myalgias.  Skin:  Negative for rash.  Neurological:  Negative for dizziness, tingling, focal weakness, seizures, weakness and headaches.  Endo/Heme/Allergies:  Does not bruise/bleed easily.       Hot flashes  Psychiatric/Behavioral:  Negative for depression and suicidal ideas. The patient does not have insomnia.       Allergies  Allergen Reactions   Celebrex [Celecoxib] Rash   Clindamycin Hcl Rash   Loratadine Other (See Comments)    fevers   Omeprazole Other (See Comments)    After 1 week of therapy dry mouth and mild nighttime urinary frequency -bothersome enough for her stop it.    Penicillins Rash    Slight redness and itching    Sulfa Antibiotics Rash     Past Medical  History:  Diagnosis Date   Asthma    Breast cancer (HCC) 2005   Cancer (HCC)    Breast- Right-2005   Osteopenia    Personal history of chemotherapy    Personal history of radiation therapy    PONV (postoperative nausea and vomiting)      Past Surgical History:   Procedure Laterality Date   BREAST BIOPSY Right 2005   +   BREAST BIOPSY Right 03/01/2020   us bx of mass at 2:00 heart marker,  INVASIVE MAMMARY CARCINOMA, NO   BREAST BIOPSY Left 03/01/2020   us bx mass at 3:00, coil marker,  INVASIVE MAMMARY CARCINOMA   BREAST BIOPSY Right 03/01/2020   us bx of right axilla, no marker placed,BENIGN LYMPH NODE.   BREAST LUMPECTOMY Right 2005   BREAST LUMPECTOMY WITH NEEDLE LOCALIZATION AND AXILLARY SENTINEL LYMPH NODE BX Right 2005   BREAST LUMPECTOMY WITH RADIOACTIVE SEED AND SENTINEL LYMPH NODE BIOPSY Bilateral 04/14/2020   Procedure: BILATERAL RADIOACTIVE SEED GUIDED BREAST LUMPECTOMIES, BILATERAL AXILLARY SENTINEL LYMPH NODE BIOPSIES, WITH BLUE DYE INJECTION RIGHT BREAST;  Surgeon: Newman, David, MD;  Location: [facility] OR;  Service: General;  Laterality: Bilateral;   BREAST SURGERY     COLONOSCOPY WITH PROPOFOL N/A 04/02/2017   Procedure: COLONOSCOPY WITH PROPOFOL;  Surgeon: [name], Kiran, MD;  Location: [facility] ENDOSCOPY;  Service: Endoscopy;  Laterality: N/A;   ESOPHAGOGASTRODUODENOSCOPY (EGD) WITH PROPOFOL N/A 04/02/2017   Procedure: ESOPHAGOGASTRODUODENOSCOPY (EGD) WITH PROPOFOL;  Surgeon: [name], Kiran, MD;  Location: [facility] ENDOSCOPY;  Service: Endoscopy;  Laterality: N/A;   TONSILLECTOMY  ~2000    Social History   Socioeconomic History   Marital status: Married    Spouse name: Not on file   Number of children: Not on file   Years of education: Not on file   Highest education level: Not on file  Occupational History   Not on file  Tobacco Use   Smoking status: Never   Smokeless tobacco: Never  Vaping Use   Vaping Use: Never used  Substance and Sexual Activity   Alcohol use: No   Drug use: No   Sexual activity: Not Currently  Other Topics Concern   Not on file  Social History Narrative   Not on file   Social Determinants of Health   Financial Resource Strain: Not on file  Food Insecurity: Not on file  Transportation Needs: Not on file   Physical Activity: Not on file  Stress: Not on file  Social Connections: Not on file  Intimate Partner Violence: Not on file    Family History  Problem Relation Age of Onset   Cancer Mother 70       Breast, now bilateral   Breast cancer Mother 70       right breast ca then left breast ca    Leukemia Mother    Heart attack Father    Hepatitis Father      Current Outpatient Medications:    acetaminophen (TYLENOL) 500 MG tablet, Take 500-1,000 mg by mouth 2 (two) times daily as needed (pain.)., Disp: , Rfl:    calcium-vitamin D (OSCAL WITH D) 500-200 MG-UNIT tablet, Take 1 tablet by mouth 2 (two) times daily., Disp: , Rfl:    cetirizine (ZYRTEC) 10 MG tablet, Take 10 mg by mouth daily as needed., Disp: , Rfl:    Cholecalciferol (DIALYVITE VITAMIN D 5000 PO), Take 5,000 Units by mouth daily., Disp: , Rfl:    letrozole (FEMARA) 2.5 MG tablet, Take 1 tablet (2.5 mg total)   by mouth daily., Disp: 90 tablet, Rfl: 1   magnesium oxide (MAG-OX) 400 MG tablet, Take by mouth., Disp: , Rfl:    melatonin 5 MG TABS, 5 mg at bedtime as needed., Disp: , Rfl:    naproxen sodium (ALEVE) 220 MG tablet, Take 220-440 mg by mouth daily as needed (pain.)., Disp: , Rfl:    nitrofurantoin, macrocrystal-monohydrate, (MACROBID) 100 MG capsule, Take 1 capsule (100 mg total) by mouth 2 (two) times daily for 5 days., Disp: 10 capsule, Rfl: 0   NON FORMULARY, [place] TAIL MUSHROOM--1 capsule 2x a day/, Disp: , Rfl:    famotidine (PEPCID) 20 MG tablet, Take 20 mg by mouth daily as needed for heartburn or indigestion. (Patient not taking: Reported on 06/25/2022), Disp: , Rfl:    fluticasone (FLONASE) 50 MCG/ACT nasal spray, Place 2 sprays into both nostrils daily. (Patient not taking: Reported on 06/25/2022), Disp: 16 g, Rfl: 6   traZODone (DESYREL) 50 MG tablet, TAKE 1 TABLET BY MOUTH NIGHTLY AS NEEDED FOR SLEEP (Patient not taking: Reported on 06/25/2022), Disp: 30 tablet, Rfl: 11  Physical exam:  Vitals:   06/25/22  1119  BP: 125/89  Pulse: 92  Resp: 18  Temp: 97.6 F (36.4 C)  SpO2: 98%  Weight: 203 lb 1.6 oz (92.1 kg)   Physical Exam Constitutional:      General: She is not in acute distress. Cardiovascular:     Rate and Rhythm: Normal rate and regular rhythm.     Heart sounds: Normal heart sounds.  Pulmonary:     Effort: Pulmonary effort is normal.     Breath sounds: Normal breath sounds.  Abdominal:     General: Bowel sounds are normal.     Palpations: Abdomen is soft.  Skin:    General: Skin is warm and dry.  Neurological:     Mental Status: She is alert and oriented to person, place, and time.  Breast exam: Patient is s/p bilateral lumpectomy with well-healed surgical scar.  No palpable bilateral breast masses.  No palpable bilateral axillary adenopathy.     Latest Ref Rng & Units 12/25/2021   10:41 AM  CMP  Glucose 70 - 99 mg/dL 100   BUN 6 - 20 mg/dL 12   Creatinine 0.44 - 1.00 mg/dL 0.91   Sodium 135 - 145 mmol/L 137   Potassium 3.5 - 5.1 mmol/L 3.9   Chloride 98 - 111 mmol/L 102   CO2 22 - 32 mmol/L 28   Calcium 8.9 - 10.3 mg/dL 9.6   Total Protein 6.5 - 8.1 g/dL 7.8   Total Bilirubin 0.3 - 1.2 mg/dL 0.5   Alkaline Phos 38 - 126 U/L 96   AST 15 - 41 U/L 28   ALT 0 - 44 U/L 29       Latest Ref Rng & Units 12/25/2021   10:41 AM  CBC  WBC 4.0 - 10.5 K/uL 5.5   Hemoglobin 12.0 - 15.0 g/dL 14.1   Hematocrit 36.0 - 46.0 % 43.2   Platelets 150 - 400 K/uL 239     No images are attached to the encounter.  DG Bone Density  Result Date: 06/19/2022 EXAM: DUAL X-RAY ABSORPTIOMETRY (DXA) FOR BONE MINERAL DENSITY IMPRESSION: Dear Dr. Janese Banks, Your patient CARROLYN HILMES completed a FRAX assessment on 06/19/2022 using the [place] (analysis version: 14.10) manufactured by [employer]. The following summarizes the results of our evaluation. PATIENT BIOGRAPHICAL: Name: Juno, Alers Patient ID: [id number] Birth Date: Nov 06, 1961  Height:    66.0 in. Gender:     Female     [age]       Weight:    203.4 lbs. Ethnicity:  White                            Exam Date: 06/19/2022 FRAX* RESULTS:  (version: 3.5) 10-year Probability of Fracture1 Major Osteoporotic Fracture2 Hip Fracture 13.4% 1.2% Population: [place] (Caucasian) Risk Factors: History of Fracture (Adult) Based on Femur (Left) Neck BMD 1 -The 10-year probability of fracture may be lower than reported if the patient has received treatment. 2 -Major Osteoporotic Fracture: Clinical Spine, Forearm, Hip or Shoulder *FRAX is a trademark of the [employer] Met[facility] Bone Disease, a World Health Organization (WHO) Collaborating Centre. ASSESSMENT: The probability of a major osteoporotic fracture is 13.4% within the next ten years. The probability of a hip fracture is 1.2% within the next ten years. . Your patient Jere Croson completed a BMD test on 06/19/2022 using the Lunar iDXA DXA System (software version: 14.10) manufactured by [employer] LUNAR. The following summarizes the results of our evaluation. Technologist: J[facility] PATIENT BIOGRAPHICAL: Name: Matheny, Radhika O Patient ID: [id number] Birth Date: 03/16/1961 Height: 66.0 in. Gender: Female Exam Date: 06/19/2022 Weight: 203.4 lbs. Indications: Caucasian, History of Breast Cancer, History of Chemo, History of Fracture (Adult), Oophorectomy Bilateral, Postmenopausal, Previous Chemo and Radiation Fractures: Toes Treatments: calcium w/ vit D, Femara, ZYRTEC DENSITOMETRY RESULTS: Site          Region     Measured Date Measured Age WHO Classification Young Adult T-score BMD         %Change vs. Previous Significant Change (*) AP Spine L3-L4 06/19/2022 59.9 Normal -0.6 1.140 g/cm2 0.1% - AP Spine L3-L4 06/16/2020 57.9 Normal -0.6 1.139 g/cm2 2.4% - AP Spine L3-L4 12/15/2014 52.4 Normal -0.8 1.112 g/cm2 -2.5% - AP Spine L3-L4 11/13/2012 50.3 Normal -0.6 1.141 g/cm2 1.1% - AP Spine L3-L4 09/26/2010 48.2 Normal -0.7 1.129 g/cm2 3.8% - AP Spine  L3-L4 04/20/2009 46.7 Normal -1.0 1.088 g/cm2 -0.7% - AP Spine L3-L4 04/20/2009 46.7 Normal -1.0 1.096 g/cm2 -3.6% - AP Spine L3-L4 01/12/2008 45.5 Normal -0.6 1.137 g/cm2 - - DualFemur Neck Left 06/19/2022 59.9 Osteopenia -1.6 0.821 g/cm2 2.5% - DualFemur Neck Left 06/16/2020 57.9 Osteopenia -1.7 0.801 g/cm2 -8.9% Yes DualFemur Neck Left 12/15/2014 52.4 Osteopenia -1.1 0.879 g/cm2 2.4% - DualFemur Neck Left 11/13/2012 50.3 Osteopenia -1.3 0.858 g/cm2 -7.2% Yes DualFemur Neck Left 09/26/2010 48.2 Normal -0.8 0.925 g/cm2 0.0% - DualFemur Neck Left 09/26/2010 48.2 Normal -0.8 0.925 g/cm2 5.4% - DualFemur Neck Left 04/20/2009 46.7 Osteopenia -1.2 0.878 g/cm2 0.0% - DualFemur Neck Left 04/20/2009 46.7 Osteopenia -1.1 0.878 g/cm2 -6.6% Yes DualFemur Neck Left 01/12/2008 45.5 Normal -0.7 0.940 g/cm2 - - DualFemur Total Mean 06/19/2022 59.9 Normal -1.0 0.881 g/cm2 1.8% - DualFemur Total Mean 06/16/2020 57.9 Osteopenia -1.1 0.865 g/cm2 -6.9% Yes DualFemur Total Mean 12/15/2014 52.4 Normal -0.6 0.929 g/cm2 1.4% - DualFemur Total Mean 11/13/2012 50.3 Normal -0.7 0.916 g/cm2 -3.4% Yes DualFemur Total Mean 09/26/2010 48.2 Normal -0.5 0.948 g/cm2 -1.0% - DualFemur Total Mean 04/20/2009 46.7 Normal -0.4 0.958 g/cm2 -1.6% - DualFemur Total Mean 01/12/2008 45.5 Normal -0.3 0.974 g/cm2 - - Right Forearm Radius 33% 06/19/2022 59.9 Normal -0.9 0.794 g/cm2 -7.7% Yes Right Forearm Radius 33% 11/13/2012 50.3 Normal -0.2 0.860 g/cm2 7.5% Yes Right Forearm Radius   33% 09/26/2010 48.2 Normal -0.9 0.800 g/cm2 0.0% - Right Forearm Radius 33% 09/26/2010 48.2 Normal -0.9 0.800 g/cm2 -2.4% - Right Forearm Radius 33% 04/20/2009 46.7 Normal -0.6 0.820 g/cm2 0.0% - Right Forearm Radius 33% 04/20/2009 46.7 Normal -0.6 0.820 g/cm2 -10.4% Yes Right Forearm Radius 33% 01/12/2008 45.5 Normal 0.4 0.915 g/cm2 - - ASSESSMENT: The BMD measured at Femur Neck Left is 0.821 g/cm2 with a T-score of -1.6. This patient is considered OSTEOPENIC according to World  Health Organization (WHO) criteria. The scan quality is good. L1 and L2 were excluded due to degenerative changes. Compared with prior study, there has been no significant change in the spine. Compared with prior study, there has been no significant change in the total hip. World Health Organization (WHO) criteria for post-menopausal, Caucasian Women: Normal:                   T-score at or above -1 SD Osteopenia/low bone mass: T-score between -1 and -2.5 SD Osteoporosis:             T-score at or below -2.5 SD RECOMMENDATIONS: 1. All patients should optimize calcium and vitamin D intake. 2. Consider FDA-approved medical therapies in postmenopausal women and men aged 50 years and older, based on the following: a. A hip or vertebral(clinical or morphometric) fracture b. T-score < -2.5 at the femoral neck or spine after appropriate evaluation to exclude secondary causes c. Low bone mass (T-score between -1.0 and -2.5 at the femoral neck or spine) and a 10-year probability of a hip fracture > 3% or a 10-year probability of a major osteoporosis-related fracture > 20% based on the [place]-adapted WHO algorithm 3. Clinician judgment and/or patient preferences may indicate treatment for people with 10-year fracture probabilities above or below these levels FOLLOW-UP: People with diagnosed cases of osteoporosis or at high risk for fracture should have regular bone mineral density tests. For patients eligible for Medicare, routine testing is allowed once every 2 years. The testing frequency can be increased to one year for patients who have rapidly progressing disease, those who are receiving or discontinuing medical therapy to restore bone mass, or have additional risk factors. I have reviewed this report, and agree with the above findings. Mark A. Boles, M.D. [facility] Electronically Signed   By: Mark  Boles M.D.   On: 06/19/2022 10:59     Assessment and plan- Patient is a 60 y.o. female with history of  bilateral breast cancer ER positive currently on letrozole here for routine follow-up  Clinically patient is doing well with no concerning signs and symptoms of recurrence based on today's exam.She would be due for a repeat mammogram in March 2024 which we will schedule accordingly.  Patient has baseline  osteopenia which has remained stable based on her recent bone density in July 2023.  She will continue to take calcium 1200 mg along with vitamin D 800 international units.  Higher doses of vitamin D would be required if she has evidence of vitamin D deficiency which is being followed by Dr. Sparks.  Bilateral lower extremity edema: Trace.  She has had this in the past as well.  She will follow-up with Dr. Sparks for this  I will see her back in 6 months no labs   Visit Diagnosis 1. Encounter for follow-up surveillance of breast cancer   2. Use of letrozole (Femara)      Dr. Archana Rao, MD, MPH [facility] [phone] 06/25/2022 2:29 PM                

## 2022-06-25 NOTE — Progress Notes (Signed)
Pt states at times she has noticed where she devleops swelling in her ankles; believes it may be from medications but will like to discuss.

## 2022-06-29 ENCOUNTER — Other Ambulatory Visit: Payer: Self-pay

## 2022-06-29 DIAGNOSIS — N390 Urinary tract infection, site not specified: Secondary | ICD-10-CM | POA: Diagnosis not present

## 2022-06-29 DIAGNOSIS — M545 Low back pain, unspecified: Secondary | ICD-10-CM | POA: Diagnosis not present

## 2022-06-29 DIAGNOSIS — Z87440 Personal history of urinary (tract) infections: Secondary | ICD-10-CM | POA: Diagnosis not present

## 2022-06-29 MED ORDER — CYCLOBENZAPRINE HCL 5 MG PO TABS
ORAL_TABLET | ORAL | 0 refills | Status: DC
  Filled 2022-06-29: qty 30, 10d supply, fill #0

## 2022-07-04 ENCOUNTER — Telehealth: Payer: Self-pay | Admitting: Medical Oncology

## 2022-07-04 NOTE — Telephone Encounter (Signed)
WF [id number] Understanding and Predicting Breast Cancer Events after Treatment (UPBEAT)   Outgoing call: 47 LVMOM with patient informing her that I have received the results from study labs drawn for her 24 month study visit. Asked patient to return call and let me know if she would like a copy sent to her. Informed patient lipid results are slightly above lab normal range. Patient thanked, my contact information provided.   Maxwell Marion, RN, BSN, [place] Clinical Research Nurse Lead 07/04/2022 4:33 PM

## 2022-07-06 ENCOUNTER — Telehealth: Payer: Self-pay | Admitting: Medical Oncology

## 2022-07-06 NOTE — Telephone Encounter (Signed)
WF [id number] Understanding and Predicting Breast Cancer Events after Treatment (UPBEAT)   Received email response from patient, replying to my VM on the 9th related to her study lab results. Patient requested that I email the results to her. Email with results sent to patient as requested and patient was encouraged to follow up with her PCP due to elevated lipid panel.  Patient thanked and encouraged to call with questions.   Maxwell Marion, RN, BSN, [place] Clinical Research Nurse Lead 07/06/2022 2:14 PM

## 2022-07-13 ENCOUNTER — Other Ambulatory Visit: Payer: Self-pay

## 2022-07-13 ENCOUNTER — Ambulatory Visit: Payer: 59 | Admitting: Radiation Oncology

## 2022-07-13 MED ORDER — FLUTICASONE PROPIONATE 50 MCG/ACT NA SUSP
NASAL | 6 refills | Status: DC
  Filled 2022-07-31: qty 16, 30d supply, fill #0
  Filled 2022-10-30: qty 16, 30d supply, fill #1

## 2022-07-31 ENCOUNTER — Other Ambulatory Visit: Payer: Self-pay

## 2022-08-01 ENCOUNTER — Encounter: Payer: Self-pay | Admitting: Radiation Oncology

## 2022-08-01 ENCOUNTER — Ambulatory Visit
Admission: RE | Admit: 2022-08-01 | Discharge: 2022-08-01 | Disposition: A | Discharge status: Home / Self Care | Payer: 59 | Source: Ambulatory Visit | Attending: Radiation Oncology | Admitting: Radiation Oncology

## 2022-08-01 VITALS — BP 130/85 | HR 100 | Temp 98.9°F | Resp 12 | Ht 66.0 in | Wt 205.0 lb

## 2022-08-01 DIAGNOSIS — D0512 Intraductal carcinoma in situ of left breast: Secondary | ICD-10-CM | POA: Diagnosis not present

## 2022-08-01 DIAGNOSIS — Z170 Estrogen receptor positive status [ER+]: Secondary | ICD-10-CM | POA: Diagnosis not present

## 2022-08-01 DIAGNOSIS — C50812 Malignant neoplasm of overlapping sites of left female breast: Secondary | ICD-10-CM

## 2022-08-01 NOTE — Progress Notes (Signed)
Radiation Oncology Follow up Note  Name: Marissa Phillips   Date:   08/01/2022 MRN:  [id number] DOB: 07-12-1961    This 61 y.o. female presents to the clinic today for 2-year follow-up status post bilateral breast radiation and patient previously treated to her right breast in 2006 and received partial breast radiation to her right breast and whole breast radiation to her left breast..  REFERRING PROVIDER: Idelle Crouch, MD  HPI: Patient is a 61 year old female now at 2 years having completed bilateral breast radiation.  Her right breast was a stage I invasive mammary carcinoma previously radiated back in 2006 for early invasive mammary carcinoma and she received reirradiation with partial breast irradiation.  Her left breast was a T1 lesion.  Both were ER positive PR negative HER2 not overexpressed.  She is seen today in routine follow-up she is had some swelling in her left breast which is persistent she has been diagnosed with some lymphedema of her left breast no lymphedema of her left upper extremity..  She had mammograms back in March which I have reviewed showing no evidence of malignancy in either breast.  She is currently on Femara tolerating it well without side effect.  She is scheduled for another mammogram this March.  COMPLICATIONS OF TREATMENT: none  FOLLOW UP COMPLIANCE: keeps appointments   PHYSICAL EXAM:  BP 130/85 (BP Location: Left Arm, Patient Position: Sitting, Cuff Size: Normal)   Pulse 100   Temp 98.9 F (37.2 C)   Resp 12   Ht 5' 6"  (1.676 m)   Wt 205 lb (93 kg)   LMP  (LMP Unknown)   BMI 33.09 kg/m  Left breast is somewhat swollen no evidence of inflammation is noted.  Both breasts have no evidence of mass or nodularity.  No axillary or supraclavicular adenopathy identified.  Lungs are clear to AMP.  RADIOLOGY RESULTS: Mammograms reviewed compatible with above-stated findings  PLAN: Present time patient is doing well now out 2 years with no evidence of  disease.  Based on her close follow-up care with medical oncology and surgery I am going to discontinue follow-up care.  I be happy to reevaluate the patient anytime should that be indicated.  Patient is to call with any concerns.  She continues on letrozole without side effect.  I would like to take this opportunity to thank you for allowing me to participate in the care of your patient.Noreene Filbert, MD

## 2022-08-28 ENCOUNTER — Encounter: Payer: Self-pay | Admitting: Internal Medicine

## 2022-08-29 ENCOUNTER — Encounter
Admission: RE | Disposition: A | Discharge status: Home / Self Care | Payer: Self-pay | Source: Ambulatory Visit | Attending: Internal Medicine

## 2022-08-29 ENCOUNTER — Other Ambulatory Visit: Payer: Self-pay

## 2022-08-29 ENCOUNTER — Encounter: Payer: Self-pay | Admitting: Internal Medicine

## 2022-08-29 ENCOUNTER — Ambulatory Visit: Payer: 59 | Admitting: Anesthesiology

## 2022-08-29 ENCOUNTER — Ambulatory Visit
Admission: RE | Admit: 2022-08-29 | Discharge: 2022-08-29 | Disposition: A | Discharge status: Home / Self Care | Payer: 59 | Source: Ambulatory Visit | Attending: Internal Medicine | Admitting: Internal Medicine

## 2022-08-29 DIAGNOSIS — K219 Gastro-esophageal reflux disease without esophagitis: Secondary | ICD-10-CM | POA: Diagnosis not present

## 2022-08-29 DIAGNOSIS — J45909 Unspecified asthma, uncomplicated: Secondary | ICD-10-CM | POA: Diagnosis not present

## 2022-08-29 DIAGNOSIS — Z86010 Personal history of colonic polyps: Secondary | ICD-10-CM | POA: Insufficient documentation

## 2022-08-29 DIAGNOSIS — Z1211 Encounter for screening for malignant neoplasm of colon: Secondary | ICD-10-CM | POA: Insufficient documentation

## 2022-08-29 DIAGNOSIS — K649 Unspecified hemorrhoids: Secondary | ICD-10-CM | POA: Diagnosis not present

## 2022-08-29 DIAGNOSIS — K640 First degree hemorrhoids: Secondary | ICD-10-CM | POA: Insufficient documentation

## 2022-08-29 HISTORY — PX: COLONOSCOPY WITH PROPOFOL: SHX5780

## 2022-08-29 SURGERY — COLONOSCOPY WITH PROPOFOL
Anesthesia: General

## 2022-08-29 MED ORDER — LIDOCAINE HCL (CARDIAC) PF 100 MG/5ML IV SOSY
PREFILLED_SYRINGE | INTRAVENOUS | Status: DC | PRN
  Administered 2022-08-29: 50 mg via INTRAVENOUS

## 2022-08-29 MED ORDER — MIDAZOLAM HCL 2 MG/2ML IJ SOLN
INTRAMUSCULAR | Status: AC
  Filled 2022-08-29: qty 2

## 2022-08-29 MED ORDER — SODIUM CHLORIDE 0.9 % IV SOLN: INTRAVENOUS | Status: DC

## 2022-08-29 MED ORDER — PROPOFOL 10 MG/ML IV BOLUS
INTRAVENOUS | Status: DC | PRN
  Administered 2022-08-29: 40 mg via INTRAVENOUS

## 2022-08-29 MED ORDER — MIDAZOLAM HCL 2 MG/2ML IJ SOLN
INTRAMUSCULAR | Status: DC | PRN
  Administered 2022-08-29: 2 mg via INTRAVENOUS

## 2022-08-29 MED ORDER — PROPOFOL 500 MG/50ML IV EMUL
INTRAVENOUS | Status: DC | PRN
  Administered 2022-08-29: 50 ug/kg/min via INTRAVENOUS

## 2022-08-29 NOTE — H&P (Signed)
Outpatient short stay form Pre-procedure 08/29/2022 11:01 AM Marissa Phillips [name], M.D.  Primary Physician: [name], M.D.  Reason for visit:  Personal history of colon polyps.  History of present illness:                            Patient presents for colonoscopy for a personal hx of colon polyps. The patient denies abdominal pain, abnormal weight loss or rectal bleeding.  04/12/2017 Physician: Dr. Raliegh Ip. [name] @ [facility] Polyps Removed: Sessile Serrated Adenoma     Current Facility-Administered Medications:    0.9 %  sodium chloride infusion, , Intravenous, Continuous, [name], [name], MD  Medications Prior to Admission  Medication Sig Dispense Refill Last Dose   calcium-vitamin D (OSCAL WITH D) 500-200 MG-UNIT tablet Take 1 tablet by mouth 2 (two) times daily.   08/28/2022   cetirizine (ZYRTEC) 10 MG tablet Take 10 mg by mouth daily as needed.   Past Week   Cholecalciferol (DIALYVITE VITAMIN D 5000 PO) Take 5,000 Units by mouth daily.   08/28/2022   cyclobenzaprine (FLEXERIL) 5 MG tablet Take 1 tablet (5 mg total) by mouth 3 (three) times daily as needed for Muscle spasms for up to 10 days 30 tablet 0 Past Month   famotidine (PEPCID) 20 MG tablet Take 20 mg by mouth daily as needed for heartburn or indigestion.   Past Month   letrozole (FEMARA) 2.5 MG tablet Take 1 tablet (2.5 mg total) by mouth daily. 90 tablet 1 08/28/2022   melatonin 5 MG TABS 5 mg at bedtime as needed.   Past Week   naproxen sodium (ALEVE) 220 MG tablet Take 220-440 mg by mouth daily as needed (pain.).   Past Week   acetaminophen (TYLENOL) 500 MG tablet Take 500-1,000 mg by mouth 2 (two) times daily as needed (pain.).      fluticasone (FLONASE) 50 MCG/ACT nasal spray Place 2 sprays into both nostrils daily. (Patient not taking: Reported on 06/25/2022) 16 g 6    fluticasone (FLONASE) 50 MCG/ACT nasal spray Place into both nostrils. 16 g 6    magnesium oxide (MAG-OX) 400 MG tablet Take by mouth. (Patient not taking: Reported  on 08/29/2022)   Not Taking   NON FORMULARY [place] TAIL MUSHROOM--1 capsule 2x a day/        Allergies  Allergen Reactions   Celebrex [Celecoxib] Rash   Clindamycin Hcl Rash   Loratadine Other (See Comments)    fevers   Omeprazole Other (See Comments)    After 1 week of therapy dry mouth and mild nighttime urinary frequency -bothersome enough for her stop it.    Penicillins Rash    Slight redness and itching    Sulfa Antibiotics Rash     Past Medical History:  Diagnosis Date   Asthma    Breast cancer ([place]) 2005   Cancer [facility])    Breast- Right-2005   Osteopenia    Personal history of chemotherapy    Personal history of radiation therapy    PONV (postoperative nausea and vomiting)     Review of systems:  Otherwise negative.    Physical Exam  Gen: Alert, oriented. Appears stated age.  HEENT: [place]/AT. PERRLA. Lungs: CTA, no wheezes. CV: RR nl S1, S2. Abd: soft, benign, no masses. BS+ Ext: No edema. Pulses 2+    Planned procedures: Proceed with colonoscopy. The patient understands the nature of the planned procedure, indications, risks, alternatives and potential complications including but  not limited to bleeding, infection, perforation, damage to internal organs and possible oversedation/side effects from anesthesia. The patient agrees and gives consent to proceed.  Please refer to procedure notes for findings, recommendations and patient disposition/instructions.     [name] [name], M.D. Gastroenterology 08/29/2022  11:01 AM

## 2022-08-29 NOTE — Interval H&P Note (Signed)
History and Physical Interval Note:  08/29/2022 11:02 AM  Marissa Phillips  has presented today for surgery, with the diagnosis of personal history adenomatous polyp.  The various methods of treatment have been discussed with the patient and family. After consideration of risks, benefits and other options for treatment, the patient has consented to  Procedure(s): COLONOSCOPY WITH PROPOFOL (N/A) as a surgical intervention.  The patient's history has been reviewed, patient examined, no change in status, stable for surgery.  I have reviewed the patient's chart and labs.  Questions were answered to the patient's satisfaction.     Anon Raices, Viola

## 2022-08-29 NOTE — Anesthesia Preprocedure Evaluation (Signed)
Anesthesia Evaluation  Patient identified by MRN, date of birth, ID band Patient awake    Reviewed: Allergy & Precautions, NPO status , Patient's Chart, lab work & pertinent test results  History of Anesthesia Complications (+) PONV and history of anesthetic complications  Airway Mallampati: II  TM Distance: >3 FB Neck ROM: Full    Dental  (+) Chipped   Pulmonary asthma ,    Pulmonary exam normal breath sounds clear to auscultation       Cardiovascular negative cardio ROS Normal cardiovascular exam Rhythm:Regular Rate:Normal     Neuro/Psych negative neurological ROS  negative psych ROS   GI/Hepatic Neg liver ROS, GERD  ,  Endo/Other  negative endocrine ROS  Renal/GU negative Renal ROS  negative genitourinary   Musculoskeletal negative musculoskeletal ROS (+)   Abdominal   Peds negative pediatric ROS (+)  Hematology negative hematology ROS (+)   Anesthesia Other Findings   Reproductive/Obstetrics negative OB ROS                            Anesthesia Physical Anesthesia Plan  ASA: 2  Anesthesia Plan: General   Post-op Pain Management: Minimal or no pain anticipated   Induction: Intravenous  PONV Risk Score and Plan: 3 and Propofol infusion and TIVA  Airway Management Planned: Natural Airway and Nasal Cannula  Additional Equipment:   Intra-op Plan:   Post-operative Plan:   Informed Consent: I have reviewed the patients History and Physical, chart, labs and discussed the procedure including the risks, benefits and alternatives for the proposed anesthesia with the patient or authorized representative who has indicated his/her understanding and acceptance.     Dental Advisory Given  Plan Discussed with: Anesthesiologist, CRNA and Surgeon  Anesthesia Plan Comments: (Patient consented for risks of anesthesia including but not limited to:  - adverse reactions to  medications - risk of airway placement if required - damage to eyes, teeth, lips or other oral mucosa - nerve damage due to positioning  - sore throat or hoarseness - Damage to heart, brain, nerves, lungs, other parts of body or loss of life  Patient voiced understanding.)        Anesthesia Quick Evaluation

## 2022-08-29 NOTE — Transfer of Care (Signed)
Immediate Anesthesia Transfer of Care Note  Patient: Marissa Phillips  Procedure(s) Performed: COLONOSCOPY WITH PROPOFOL  Patient Location: PACU  Anesthesia Type:General  Level of Consciousness: sedated  Airway & Oxygen Therapy: Patient Spontanous Breathing  Post-op Assessment: Report given to RN and Post -op Vital signs reviewed and stable  Post vital signs: Reviewed and stable  Last Vitals:  Vitals Value Taken Time  BP    Temp    Pulse 85 08/29/22 1130  Resp 13 08/29/22 1130  SpO2 100 % 08/29/22 1130  Vitals shown include unvalidated device data.  Last Pain:  Vitals:   08/29/22 0957  TempSrc: Temporal  PainSc: 0-No pain         Complications: No notable events documented.

## 2022-08-29 NOTE — Op Note (Addendum)
[facility] Patient Name: Marissa Phillips Procedure Date: 08/29/2022 11:00 AM MRN: [id number] Account #: [id number] Date of Birth: July 08, 1961 Admit Type: Outpatient Age: 61 Room: [facility] ENDO ROOM 2 Gender: Female Note Status: Finalized Instrument Name: Jasper Riling [id number] Procedure:             Colonoscopy Indications:           Surveillance: Personal history of adenomatous polyps                         on last colonoscopy > 5 years ago Providers:             Lorie Apley [name] MD, MD Referring MD:          Leonie Douglas. Doy Hutching, MD (Referring MD) Medicines:             Propofol per Anesthesia Complications:         No immediate complications. Procedure:             Pre-Anesthesia Assessment:                        - The risks and benefits of the procedure and the                         sedation options and risks were discussed with the                         patient. All questions were answered and informed                         consent was obtained.                        - Patient identification and proposed procedure were                         verified prior to the procedure by the nurse. The                         procedure was verified in the procedure room.                        - ASA Grade Assessment: III - A patient with severe                         systemic disease.                        - After reviewing the risks and benefits, the patient                         was deemed in satisfactory condition to undergo the                         procedure in an ambulatory setting.                        After obtaining informed consent, the colonoscope was  passed under direct vision. Throughout the procedure,                         the patient's blood pressure, pulse, and oxygen                         saturations were monitored continuously. The                         Colonoscope was introduced through the anus and                          advanced to the the cecum, identified by appendiceal                         orifice and ileocecal valve. The colonoscopy was                         somewhat difficult due to a redundant colon.                         Successful completion of the procedure was aided by                         changing the patient to a prone position. The patient                         tolerated the procedure well. The quality of the bowel                         preparation was good. The ileocecal valve, appendiceal                         orifice, and rectum were photographed. Findings:      The perianal and digital rectal examinations were normal. Pertinent       negatives include normal sphincter tone and no palpable rectal lesions.      Non-bleeding internal hemorrhoids were found during retroflexion. The       hemorrhoids were Grade I (internal hemorrhoids that do not prolapse).      The colon (entire examined portion) appeared normal. Impression:            - Non-bleeding internal hemorrhoids.                        - The entire examined colon is normal.                        - No specimens collected. Recommendation:        - Patient has a contact number available for                         emergencies. The signs and symptoms of potential                         delayed complications were discussed with the patient.                         Return to  normal activities tomorrow. Written                         discharge instructions were provided to the patient.                        - Resume previous diet.                        - Continue present medications.                        - Repeat colonoscopy in 10 years for screening                         purposes.                        - Return to GI office PRN. Procedure Code(s):     --- Professional ---                        B3383, Colorectal cancer screening; colonoscopy on                         individual at high  risk Diagnosis Code(s):     --- Professional ---                        K64.0, First degree hemorrhoids                        Z86.010, Personal history of colonic polyps CPT copyright 2019 American Medical Association. All rights reserved. The codes documented in this report are preliminary and upon coder review may  be revised to meet current compliance requirements. Efrain Sella MD, MD 08/29/2022 11:34:04 AM This report has been signed electronically. Number of Addenda: 0 Note Initiated On: 08/29/2022 11:00 AM Scope Withdrawal Time: 0 hours 6 minutes 55 seconds  Total Procedure Duration: 0 hours 12 minutes 7 seconds  Estimated Blood Loss:  Estimated blood loss: none.      [facility]

## 2022-08-29 NOTE — Anesthesia Postprocedure Evaluation (Signed)
Anesthesia Post Note  Patient: Marissa Phillips  Procedure(s) Performed: COLONOSCOPY WITH PROPOFOL  Patient location during evaluation: Endoscopy Anesthesia Type: General Level of consciousness: awake and alert Pain management: pain level controlled Vital Signs Assessment: post-procedure vital signs reviewed and stable Respiratory status: spontaneous breathing, nonlabored ventilation, respiratory function stable and patient connected to nasal cannula oxygen Cardiovascular status: blood pressure returned to baseline and stable Postop Assessment: no apparent nausea or vomiting Anesthetic complications: no   No notable events documented.   Last Vitals:  Vitals:   08/29/22 1140 08/29/22 1150  BP: 122/80 122/75  Pulse: 89 86  Resp: 14 14  Temp:    SpO2: 100% 100%    Last Pain:  Vitals:   08/29/22 1150  TempSrc:   PainSc: 0-No pain                 Ilene Qua

## 2022-08-30 ENCOUNTER — Encounter: Payer: Self-pay | Admitting: Internal Medicine

## 2022-09-07 ENCOUNTER — Telehealth: Payer: Self-pay | Admitting: Medical Oncology

## 2022-09-07 NOTE — Telephone Encounter (Signed)
WF [id number] Understanding and Predicting Breast Cancer Events after Treatment ([facility]BEAT)   Outgoing call: 1:20 PM  Call to patient to inform her we have received, and reviewed with Dr. Janese Banks, the [facility] 31-monthcardiac MRI clinical report, date of scan was July 5th, 2023. This research protocol is not a full clinical exam and only evaluates part of the heart. Patient was informed that there was an abnormal finding and specifics are not provided, report did state "suspect the LVEF is 40% to 45%;" as well as "Global hypokinesis". Patient reports to be doing well, denies shortness of breath or having any new issues or concerns outside of her normal. Patient confirms having ongoing mild bilateral ankle swelling at times, and reports that it is minor and nothing new. Patient informed that Dr. RJanese Banksrecommends patient follow-up with her PCP should patient have issues or concerns. Patient states she has her physical in February and will discuss with her primary, should anything change.  Patient denied having questions at this time. Patient thanked and encouraged to call with questions or concerns.  Patient was emailed a copy of the clinical report for her reference.   MMaxwell Marion RN, BSN, CDickerson CityClinical Research Nurse Lead 09/07/2022 1:41 PM

## 2022-09-14 DIAGNOSIS — C50011 Malignant neoplasm of nipple and areola, right female breast: Secondary | ICD-10-CM | POA: Diagnosis not present

## 2022-09-14 DIAGNOSIS — C50012 Malignant neoplasm of nipple and areola, left female breast: Secondary | ICD-10-CM | POA: Diagnosis not present

## 2022-09-14 DIAGNOSIS — R0609 Other forms of dyspnea: Secondary | ICD-10-CM | POA: Diagnosis not present

## 2022-09-14 DIAGNOSIS — R9430 Abnormal result of cardiovascular function study, unspecified: Secondary | ICD-10-CM | POA: Diagnosis not present

## 2022-09-14 DIAGNOSIS — E785 Hyperlipidemia, unspecified: Secondary | ICD-10-CM | POA: Diagnosis not present

## 2022-09-28 DIAGNOSIS — Z170 Estrogen receptor positive status [ER+]: Secondary | ICD-10-CM | POA: Diagnosis not present

## 2022-09-28 DIAGNOSIS — C50012 Malignant neoplasm of nipple and areola, left female breast: Secondary | ICD-10-CM | POA: Diagnosis not present

## 2022-09-28 DIAGNOSIS — C50011 Malignant neoplasm of nipple and areola, right female breast: Secondary | ICD-10-CM | POA: Diagnosis not present

## 2022-09-28 DIAGNOSIS — I890 Lymphedema, not elsewhere classified: Secondary | ICD-10-CM | POA: Diagnosis not present

## 2022-10-15 ENCOUNTER — Other Ambulatory Visit: Payer: Self-pay

## 2022-10-16 ENCOUNTER — Telehealth: Payer: 59 | Admitting: Physician Assistant

## 2022-10-16 ENCOUNTER — Other Ambulatory Visit: Payer: Self-pay

## 2022-10-16 DIAGNOSIS — R3989 Other symptoms and signs involving the genitourinary system: Secondary | ICD-10-CM

## 2022-10-16 MED ORDER — NITROFURANTOIN MONOHYD MACRO 100 MG PO CAPS
100.0000 mg | ORAL_CAPSULE | Freq: Two times a day (BID) | ORAL | 0 refills | Status: DC
  Filled 2022-10-16: qty 10, 5d supply, fill #0

## 2022-10-16 NOTE — Progress Notes (Signed)
I have spent 5 minutes in review of e-visit questionnaire, review and updating patient chart, medical decision making and response to patient.   Georgianna Band Cody Meron Bocchino, PA-C    

## 2022-10-16 NOTE — Progress Notes (Signed)

## 2022-10-30 ENCOUNTER — Other Ambulatory Visit: Payer: Self-pay | Admitting: Oncology

## 2022-10-30 ENCOUNTER — Other Ambulatory Visit: Payer: Self-pay

## 2022-10-30 MED ORDER — LETROZOLE 2.5 MG PO TABS
2.5000 mg | ORAL_TABLET | Freq: Every day | ORAL | 1 refills | Status: DC
  Filled 2022-10-30: qty 90, 90d supply, fill #0
  Filled 2023-01-29: qty 90, 90d supply, fill #1

## 2022-11-07 ENCOUNTER — Other Ambulatory Visit: Payer: Self-pay

## 2022-11-07 ENCOUNTER — Telehealth: Payer: Self-pay

## 2022-11-07 ENCOUNTER — Ambulatory Visit: Payer: 59 | Attending: Internal Medicine | Admitting: Internal Medicine

## 2022-11-07 ENCOUNTER — Encounter: Payer: Self-pay | Admitting: Internal Medicine

## 2022-11-07 VITALS — BP 116/90 | HR 96 | Ht 66.0 in | Wt 206.0 lb

## 2022-11-07 DIAGNOSIS — C50911 Malignant neoplasm of unspecified site of right female breast: Secondary | ICD-10-CM

## 2022-11-07 DIAGNOSIS — E785 Hyperlipidemia, unspecified: Secondary | ICD-10-CM | POA: Diagnosis not present

## 2022-11-07 DIAGNOSIS — C50912 Malignant neoplasm of unspecified site of left female breast: Secondary | ICD-10-CM | POA: Diagnosis not present

## 2022-11-07 DIAGNOSIS — I429 Cardiomyopathy, unspecified: Secondary | ICD-10-CM | POA: Diagnosis not present

## 2022-11-07 DIAGNOSIS — I5022 Chronic systolic (congestive) heart failure: Secondary | ICD-10-CM

## 2022-11-07 MED ORDER — CARVEDILOL 3.125 MG PO TABS
3.1250 mg | ORAL_TABLET | Freq: Two times a day (BID) | ORAL | 3 refills | Status: DC
  Filled 2022-11-07: qty 180, 90d supply, fill #0
  Filled 2023-01-29: qty 180, 90d supply, fill #1
  Filled 2023-05-03: qty 180, 90d supply, fill #2
  Filled 2023-07-14: qty 180, 90d supply, fill #3

## 2022-11-07 NOTE — Progress Notes (Signed)
New Outpatient Visit Date: 11/07/2022  Referring Provider: Idelle Crouch, MD [place] [facility] [place],  [place] [address]  Chief Complaint: Abnormal cardiac MRI  HPI:  Marissa Phillips is a 61 y.o. female who is being seen today for the evaluation of a myopathy at the request of Dr. Doy Hutching. She has a history of recurrent breast cancer, hyperlipidemia, and asthma.  Has been participating in a research study through [facility] that has included serial cardiac MRIs.  She brings a limited report with her today indicating suspected LVEF of 40-45 prior sent on most recent study in 08/2022.  There was concern that the initial scan in 06/2020 showed an EF of 43.7%; repeat scan in 09/2020 did not make note of any abnormalities.  [name] notes that she was initially treated with a lumpectomy, radiation, and chemotherapy for right breast cancer in 2005.  Her chemotherapy regimen included Adriamycin.  Her bilateral breast cancer in 2021 was managed with lumpectomies and radiation followed by aromatase inhibitor therapy.  Ms. Ofallon denies a history of heart problems.  She has not had any chest pain.  She notes occasional shortness of breath but attributes it mostly to rhinorrhea and sinus congestion that she attributes to aromatase inhibitors.  She has not had any difficulties recently walking on the treadmill with her husband.  She has noticed some increased ankle swelling over the last 6 months, though she notes that it is not pitting.  She has not had any palpitations or lightheadedness.  --------------------------------------------------------------------------------------------------  Cardiovascular History & Procedures: Cardiovascular Problems: Cardiomyopathy  Risk Factors: Hyperlipidemia  Cath/PCI: None  CV Surgery: None  EP Procedures and Devices: None  Non-Invasive Evaluation(s): Cardiac MRI (09/04/2022, [facility]-research protocol): Full findings not  available.  Limited report notes LVEF 40-45% with global hypokinesis.  Recent CV Pertinent Labs: Lab Results  Component Value Date   K 3.9 12/25/2021   K 4.0 10/13/2012   BUN 12 12/25/2021   BUN 9 10/13/2012   CREATININE 0.91 12/25/2021   CREATININE 0.64 10/13/2012    --------------------------------------------------------------------------------------------------  Past Medical History:  Diagnosis Date   Asthma    Breast cancer ([place]) 2005   Cancer ([place])    Breast- Right-2005; bilateral-2021   Cardiomyopathy ([place]) 07/14/2020   LVEF 43.7% by cardiac MRI; LVEF 40-45% with goal hypokinesis on repeat cardiac MRI 09/04/2022 (research protocol through [facility])   Hyperlipidemia    Diet controlled   Osteopenia    Personal history of chemotherapy    Personal history of radiation therapy    PONV (postoperative nausea and vomiting)     Past Surgical History:  Procedure Laterality Date   BREAST BIOPSY Right 2005   +   BREAST BIOPSY Right 03/01/2020   [place] bx of mass at 2:00 heart marker,  INVASIVE MAMMARY CARCINOMA, NO   BREAST BIOPSY Left 03/01/2020   [place] bx mass at 3:00, coil marker,  INVASIVE MAMMARY CARCINOMA   BREAST BIOPSY Right 03/01/2020   [place] bx of right axilla, no marker placed,BENIGN LYMPH NODE.   BREAST LUMPECTOMY Right 2005   BREAST LUMPECTOMY WITH NEEDLE LOCALIZATION AND AXILLARY SENTINEL LYMPH NODE BX Right 2005   BREAST LUMPECTOMY WITH RADIOACTIVE SEED AND SENTINEL LYMPH NODE BIOPSY Bilateral 04/14/2020   Procedure: BILATERAL RADIOACTIVE SEED GUIDED BREAST LUMPECTOMIES, BILATERAL AXILLARY SENTINEL LYMPH NODE BIOPSIES, WITH BLUE DYE INJECTION RIGHT BREAST;  Surgeon: Alphonsa Overall, MD;  Location: [place];  Service: General;  Laterality: Bilateral;   BREAST SURGERY  COLONOSCOPY WITH PROPOFOL N/A 04/02/2017   Procedure: COLONOSCOPY WITH PROPOFOL;  Surgeon: Jonathon Bellows, MD;  Location: [facility] ENDOSCOPY;  Service: Endoscopy;  Laterality: N/A;   COLONOSCOPY WITH PROPOFOL N/A  08/29/2022   Procedure: COLONOSCOPY WITH PROPOFOL;  Surgeon: Toledo, [name], MD;  Location: [facility] ENDOSCOPY;  Service: Gastroenterology;  Laterality: N/A;   ESOPHAGOGASTRODUODENOSCOPY (EGD) WITH PROPOFOL N/A 04/02/2017   Procedure: ESOPHAGOGASTRODUODENOSCOPY (EGD) WITH PROPOFOL;  Surgeon: Jonathon Bellows, MD;  Location[facility] ENDOSCOPY;  Service: Endoscopy;  Laterality: N/A;   TONSILLECTOMY  ~2000    Current Meds  Medication Sig   acetaminophen (TYLENOL) 500 MG tablet Take 500-1,000 mg by mouth 2 (two) times daily as needed (pain.).   Calcium Carbonate (CALCIUM 500 PO) Take 1 tablet by mouth daily.   carvedilol (COREG) 3.125 MG tablet Take 1 tablet (3.125 mg total) by mouth 2 (two) times daily.   cetirizine (ZYRTEC) 10 MG tablet Take 10 mg by mouth daily as needed.   famotidine (PEPCID) 20 MG tablet Take 20 mg by mouth daily as needed for heartburn or indigestion.   fluticasone (FLONASE) 50 MCG/ACT nasal spray Place into both nostrils.   letrozole (FEMARA) 2.5 MG tablet Take 1 tablet (2.5 mg total) by mouth daily.   melatonin 5 MG TABS 5 mg at bedtime as needed.   naproxen sodium (ALEVE) 220 MG tablet Take 220-440 mg by mouth daily as needed (pain.).   NON FORMULARY [place] TAIL MUSHROOM--1 capsule 2x a day/   [DISCONTINUED] cyclobenzaprine (FLEXERIL) 5 MG tablet Take 1 tablet (5 mg total) by mouth 3 (three) times daily as needed for Muscle spasms for up to 10 days   [DISCONTINUED] fluticasone (FLONASE) 50 MCG/ACT nasal spray Place 2 sprays into both nostrils daily. (Patient taking differently: Place 2 sprays into both nostrils daily as needed.)    Allergies: Celebrex [celecoxib], Clindamycin hcl, Loratadine, Omeprazole, Penicillins, and Sulfa antibiotics  Social History   Tobacco Use   Smoking status: Never   Smokeless tobacco: Never  Vaping Use   Vaping Use: Never used  Substance Use Topics   Alcohol use: No   Drug use: No    Family History  Problem Relation Age of Onset   Atrial  fibrillation Mother    Hypertension Mother    Cancer Mother 48       Breast, now bilateral   Breast cancer Mother 32       right breast ca then left breast ca    Leukemia Mother    Hypertension Father    Hepatitis Father    Heart failure Father    Hypertension Sister     Review of Systems: A 12-system review of systems was performed and was negative except as noted in the HPI.  --------------------------------------------------------------------------------------------------  Physical Exam: BP (!) 116/90 (BP Location: Right Arm, Patient Position: Sitting, Cuff Size: Large)   Pulse 96   Ht '5\' 6"'$  (1.676 m)   Wt 206 lb (93.4 kg)   LMP  (LMP Unknown)   SpO2 99%   BMI 33.25 kg/m   General:  NAD. HEENT: No conjunctival pallor or scleral icterus. Neck: Supple without lymphadenopathy, thyromegaly, JVD, or HJR. No carotid bruit. Lungs: Normal work of breathing. Clear to auscultation bilaterally without wheezes or crackles. Heart: Regular rate and rhythm without murmurs, rubs, or gallops. Non-displaced PMI. Abd: Bowel sounds present. Soft, NT/ND without hepatosplenomegaly Ext: Trace pretibial edema bilaterally. Radial, PT, and DP pulses are 2+ bilaterally Skin: Warm and dry without rash. Neuro: CNIII-XII intact. Strength and  fine-touch sensation intact in upper and lower extremities bilaterally. Psych: Normal mood and affect.  EKG: Normal sinus rhythm with borderline LVH.  Otherwise, no significant abnormality.  Lab Results  Component Value Date   WBC 5.5 12/25/2021   HGB 14.1 12/25/2021   HCT 43.2 12/25/2021   MCV 91.9 12/25/2021   PLT 239 12/25/2021    Lab Results  Component Value Date   NA 137 12/25/2021   K 3.9 12/25/2021   CL 102 12/25/2021   CO2 28 12/25/2021   BUN 12 12/25/2021   CREATININE 0.91 12/25/2021   GLUCOSE 100 (H) 12/25/2021   ALT 29 12/25/2021    --------------------------------------------------------------------------------------------------  ASSESSMENT AND PLAN: Cardiomyopathy and heart failure with mildly reduced ejection fraction: Ms. Spindel is minimally symptomatic, though cardiac MRIs as part of a research protocol through [facility] have shown mildly-moderately reduced LVEF dating back to 06/2020.  Her history is notable for recurrent breast cancer including Adriamycin exposure as part of her initial cancer treatment in 2005.  She has trace pretibial edema today but otherwise appears euvolemic with NYHA class I-II symptoms.  We have agreed to obtain an echocardiogram to reassess her LVEF and also to evaluate for other underlying structural heart disease.  Based on results, we will need to consider ischemia evaluation.  We discussed the role for goal-directed medical therapy and will initiate carvedilol 3.125 mg twice daily.  Addition of an ARB will be considered at follow-up.  Bilateral breast cancer: Ongoing follow-up per Dr. Janese Banks.  Hyperlipidemia: LDL mildly elevated on last check with Dr. Doy Hutching.  Continue diet and exercise.  Follow-up: Return to clinic in 1 month.  Nelva Bush, MD 11/09/2022 7:11 AM

## 2022-11-07 NOTE — Telephone Encounter (Signed)
Attempted to contact pt requesting to bring cardiac mri result from [facility]. Left message to call back.

## 2022-11-07 NOTE — Telephone Encounter (Signed)
Patient is returning call. She confirmed appointment for today with Dr. Saunders Revel. Patient states that she is unable to bring in her MRI results because she does not have them. She states she was previously advised that because it is not a part of routine care that she would never see the results.

## 2022-11-07 NOTE — Telephone Encounter (Signed)
Spoke to pt. Pt stated she does have MRI results from July and will bring them for review.

## 2022-11-07 NOTE — Patient Instructions (Signed)
Medication Instructions:  Your physician recommends the following medication changes.  START TAKING: Carvedilol 3.125 mg by mouth twice a day    *If you need a refill on your cardiac medications before your next appointment, please call your pharmacy*   Lab Work: None ordered today   Testing/Procedures: Your physician has requested that you have an echocardiogram. Echocardiography is a painless test that uses sound waves to create images of your heart. It provides your doctor with information about the size and shape of your heart and how well your heart's chambers and valves are working.   You may receive an ultrasound enhancing agent through an IV if needed to better visualize your heart during the echo. This procedure takes approximately one hour.  There are no restrictions for this procedure.  This will take place at [place] [id number], [place]   Follow-Up: At [facility], you and your health needs are our priority.  As part of our continuing mission to provide you with exceptional heart care, we have created designated Provider Care Teams.  These Care Teams include your primary Cardiologist (physician) and Advanced Practice Providers (APPs -  Physician Assistants and Nurse Practitioners) who all work together to provide you with the care you need, when you need it.  We recommend signing up for the patient portal called "MyChart".  Sign up information is provided on this After Visit Summary.  MyChart is used to connect with patients for Virtual Visits (Telemedicine).  Patients are able to view lab/test results, encounter notes, upcoming appointments, etc.  Non-urgent messages can be sent to your provider as well.   To learn more about what you can do with MyChart, go to [email or web address].    Your next appointment:   1 month(s)  The format for your next appointment:   In Person  Provider:   You may see Nelva Bush, MD  or one of the following Advanced Practice Providers on your designated Care Team:   Murray Hodgkins, NP Christell Faith, PA-C Cadence Kathlen Mody, PA-C Gerrie Nordmann, NP

## 2022-11-09 ENCOUNTER — Encounter: Payer: Self-pay | Admitting: Internal Medicine

## 2022-11-09 DIAGNOSIS — I5022 Chronic systolic (congestive) heart failure: Secondary | ICD-10-CM | POA: Insufficient documentation

## 2022-11-09 DIAGNOSIS — I429 Cardiomyopathy, unspecified: Secondary | ICD-10-CM | POA: Insufficient documentation

## 2022-12-12 ENCOUNTER — Telehealth: Payer: Commercial Managed Care - PPO | Admitting: Nurse Practitioner

## 2022-12-12 ENCOUNTER — Other Ambulatory Visit: Payer: Self-pay

## 2022-12-12 DIAGNOSIS — N3000 Acute cystitis without hematuria: Secondary | ICD-10-CM | POA: Diagnosis not present

## 2022-12-12 MED ORDER — NITROFURANTOIN MONOHYD MACRO 100 MG PO CAPS
100.0000 mg | ORAL_CAPSULE | Freq: Two times a day (BID) | ORAL | 0 refills | Status: AC
  Filled 2022-12-12: qty 10, 5d supply, fill #0

## 2022-12-12 NOTE — Progress Notes (Signed)
E-Visit for Urinary Problems  We are sorry that you are not feeling well.  Here is how we plan to help!  Based on what you shared with me it looks like you most likely have a simple urinary tract infection.  Please note this is your third visit with UTI symptoms in one year, we advise if symptoms recur that you are seen in person by your primary care physician for urine testing. Based on your list of antibiotic allergies there is limited treatment options and we want to assure you remain sensitive to the antibiotics we are prescribing. Thank you   A UTI (Urinary Tract Infection) is a bacterial infection of the bladder.  Most cases of urinary tract infections are simple to treat but a key part of your care is to encourage you to drink plenty of fluids and watch your symptoms carefully.  I have prescribed MacroBid 100 mg twice a day for 5 days.  Your symptoms should gradually improve. Call us if the burning in your urine worsens, you develop worsening fever, back pain or pelvic pain or if your symptoms do not resolve after completing the antibiotic.  Urinary tract infections can be prevented by drinking plenty of water to keep your body hydrated.  Also be sure when you wipe, wipe from front to back and don't hold it in!  If possible, empty your bladder every 4 hours.  HOME CARE Drink plenty of fluids Compete the full course of the antibiotics even if the symptoms resolve Remember, when you need to go.go. Holding in your urine can increase the likelihood of getting a UTI! GET HELP RIGHT AWAY IF: You cannot urinate You get a high fever Worsening back pain occurs You see blood in your urine You feel sick to your stomach or throw up You feel like you are going to pass out  MAKE SURE YOU  Understand these instructions. Will watch your condition. Will get help right away if you are not doing well or get worse.   Thank you for choosing an e-visit.  Your e-visit answers were reviewed by a  board certified advanced clinical practitioner to complete your personal care plan. Depending upon the condition, your plan could have included both over the counter or prescription medications.  Please review your pharmacy choice. Make sure the pharmacy is open so you can pick up prescription now. If there is a problem, you may contact your provider through [employer] and have the prescription routed to another pharmacy.  Your safety is important to [place]. If you have drug allergies check your prescription carefully.   For the next 24 hours you can use MyChart to ask questions about today's visit, request a non-urgent call back, or ask for a work or school excuse. You will get an email in the next two days asking about your experience. I hope that your e-visit has been valuable and will speed your recovery.   Meds ordered this encounter  Medications   nitrofurantoin, macrocrystal-monohydrate, (MACROBID) 100 MG capsule    Sig: Take 1 capsule (100 mg total) by mouth 2 (two) times daily for 5 days.    Dispense:  10 capsule    Refill:  0     I spent approximately 5 minutes reviewing the patient's history, current symptoms and coordinating their care today.

## 2022-12-15 ENCOUNTER — Encounter: Payer: Self-pay | Admitting: Emergency Medicine

## 2022-12-15 ENCOUNTER — Ambulatory Visit
Admission: EM | Admit: 2022-12-15 | Discharge: 2022-12-15 | Disposition: A | Discharge status: Home / Self Care | Payer: Commercial Managed Care - PPO | Attending: Physician Assistant | Admitting: Physician Assistant

## 2022-12-15 DIAGNOSIS — Z1152 Encounter for screening for COVID-19: Secondary | ICD-10-CM | POA: Insufficient documentation

## 2022-12-15 DIAGNOSIS — R509 Fever, unspecified: Secondary | ICD-10-CM | POA: Insufficient documentation

## 2022-12-15 DIAGNOSIS — N3000 Acute cystitis without hematuria: Secondary | ICD-10-CM | POA: Insufficient documentation

## 2022-12-15 DIAGNOSIS — R051 Acute cough: Secondary | ICD-10-CM | POA: Diagnosis not present

## 2022-12-15 LAB — RESP PANEL BY RT-PCR (RSV, FLU A&B, COVID)  RVPGX2
Influenza A by PCR: NEGATIVE
Influenza B by PCR: NEGATIVE
Resp Syncytial Virus by PCR: NEGATIVE
SARS Coronavirus 2 by RT PCR: NEGATIVE

## 2022-12-15 LAB — URINALYSIS, ROUTINE W REFLEX MICROSCOPIC
Bilirubin Urine: NEGATIVE
Glucose, UA: NEGATIVE mg/dL
Hgb urine dipstick: NEGATIVE
Ketones, ur: NEGATIVE mg/dL
Nitrite: NEGATIVE
Protein, ur: NEGATIVE mg/dL
Specific Gravity, Urine: 1.01 (ref 1.005–1.030)
pH: 7 (ref 5.0–8.0)

## 2022-12-15 LAB — URINALYSIS, MICROSCOPIC (REFLEX): RBC / HPF: NONE SEEN RBC/hpf (ref 0–5)

## 2022-12-15 NOTE — ED Provider Notes (Signed)
[facility]    CSN: [id number] Arrival date & time: 12/15/22  1440      History   Chief Complaint Chief Complaint  Patient presents with   Nasal Congestion   Sinus Problem    HPI Marissa Phillips is a 62 y.o. female presenting for temps up to 100.1 degrees, increased fatigue, slight cough, postnasal drainage and nasal congestion for the past couple of days.  She says that she is being treated for a UTI.  Urinary symptoms of dysuria comfort and urgency began 3 days ago.  She had a telemedicine appointment and was started on Macrobid.  She reports feeling better pretty much immediately.  She says she just wants to make sure she is getting better and the low-grade temperature is not related to the urinary infection.  She has not had any flank pain, vomiting or diarrhea.  She not complaining of any sore throat, breathing difficulty or chest pain.  No sick contacts.  No other complaints.  HPI  Past Medical History:  Diagnosis Date   Asthma    Breast cancer ([place]) 2005   Cancer ([place])    Breast- Right-2005; bilateral-2021   Cardiomyopathy ([place]) 07/14/2020   LVEF 43.7% by cardiac MRI; LVEF 40-45% with goal hypokinesis on repeat cardiac MRI 09/04/2022 (research protocol through [facility])   Hyperlipidemia    Diet controlled   Osteopenia    Personal history of chemotherapy    Personal history of radiation therapy    PONV (postoperative nausea and vomiting)     Patient Active Problem List   Diagnosis Date Noted   Cardiomyopathy ([place]) 11/09/2022   Heart failure with mildly reduced ejection fraction (HFmrEF) ([place]) 11/09/2022   Goals of care, counseling/discussion 03/10/2020   Bilateral malignant neoplasm of breast in female ([place]) 03/10/2020   Hx of adenomatous colonic polyps 04/29/2018   Hyperlipidemia, mild 01/02/2016   Anemia 07/06/2014   Asthma 07/06/2014   Environmental allergies 07/06/2014   GERD (gastroesophageal reflux disease) 07/06/2014   History of recurrent UTIs  07/06/2014   Osteoporosis 07/06/2014   Screening for cervical cancer 09/25/2011   History of breast cancer 09/25/2011    Past Surgical History:  Procedure Laterality Date   BREAST BIOPSY Right 2005   +   BREAST BIOPSY Right 03/01/2020   [place] bx of mass at 2:00 heart marker,  INVASIVE MAMMARY CARCINOMA, NO   BREAST BIOPSY Left 03/01/2020   [place] bx mass at 3:00, coil marker,  INVASIVE MAMMARY CARCINOMA   BREAST BIOPSY Right 03/01/2020   [place] bx of right axilla, no marker placed,BENIGN LYMPH NODE.   BREAST LUMPECTOMY Right 2005   BREAST LUMPECTOMY WITH NEEDLE LOCALIZATION AND AXILLARY SENTINEL LYMPH NODE BX Right 2005   BREAST LUMPECTOMY WITH RADIOACTIVE SEED AND SENTINEL LYMPH NODE BIOPSY Bilateral 04/14/2020   Procedure: BILATERAL RADIOACTIVE SEED GUIDED BREAST LUMPECTOMIES, BILATERAL AXILLARY SENTINEL LYMPH NODE BIOPSIES, WITH BLUE DYE INJECTION RIGHT BREAST;  Surgeon: Alphonsa Overall, MD;  Location: [place];  Service: General;  Laterality: Bilateral;   BREAST SURGERY     COLONOSCOPY WITH PROPOFOL N/A 04/02/2017   Procedure: COLONOSCOPY WITH PROPOFOL;  Surgeon: Jonathon Bellows, MD;  Location: [facility] ENDOSCOPY;  Service: Endoscopy;  Laterality: N/A;   COLONOSCOPY WITH PROPOFOL N/A 08/29/2022   Procedure: COLONOSCOPY WITH PROPOFOL;  Surgeon: Tol[place], [name], MD;  Location: [facility] ENDOSCOPY;  Service: Gastroenterology;  Laterality: N/A;   ESOPHAGOGASTRODUODENOSCOPY (EGD) WITH PROPOFOL N/A 04/02/2017   Procedure: ESOPHAGOGASTRODUODENOSCOPY (EGD) WITH PROPOFOL;  Surgeon: Jonathon Bellows, MD;  Location:  [place] [facility];  Service: Endoscopy;  Laterality: N/A;   TONSILLECTOMY  ~2000    OB History   No obstetric history on file.      Home Medications    Prior to Admission medications   Medication Sig Start Date End Date Taking? Authorizing Provider  carvedilol (COREG) 3.125 MG tablet Take 1 tablet (3.125 mg total) by mouth 2 (two) times daily. 11/07/22 11/02/23 Yes End, Harrell Gave, MD  cetirizine (ZYRTEC)  10 MG tablet Take 10 mg by mouth daily as needed.   Yes [provider]  fluticasone (FLONASE) 50 MCG/ACT nasal spray Place into both nostrils. 03/10/22  Yes   letrozole (FEMARA) 2.5 MG tablet Take 1 tablet (2.5 mg total) by mouth daily. 10/30/22  Yes Sindy Guadeloupe, MD  melatonin 5 MG TABS 5 mg at bedtime as needed. 06/12/21  Yes [provider]  nitrofurantoin, macrocrystal-monohydrate, (MACROBID) 100 MG capsule Take 1 capsule (100 mg total) by mouth 2 (two) times daily for 5 days. 12/12/22 12/17/22 Yes Apolonio Schneiders, FNP  acetaminophen (TYLENOL) 500 MG tablet Take 500-1,000 mg by mouth 2 (two) times daily as needed (pain.).    [provider]  Calcium Carbonate (CALCIUM 500 PO) Take 1 tablet by mouth daily.    [provider]  famotidine (PEPCID) 20 MG tablet Take 20 mg by mouth daily as needed for heartburn or indigestion.    [provider]  naproxen sodium (ALEVE) 220 MG tablet Take 220-440 mg by mouth daily as needed (pain.).    [provider]  NON FORMULARY [place] TAIL MUSHROOM--1 capsule 2x a day/    [provider]    Family History Family History  Problem Relation Age of Onset   Atrial fibrillation Mother    Hypertension Mother    Cancer Mother 16       Breast, now bilateral   Breast cancer Mother 64       right breast ca then left breast ca    Leukemia Mother    Hypertension Father    Hepatitis Father    Heart failure Father    Hypertension Sister     Social History Social History   Tobacco Use   Smoking status: Never   Smokeless tobacco: Never  Vaping Use   Vaping Use: Never used  Substance Use Topics   Alcohol use: No   Drug use: No     Allergies   Celebrex [celecoxib], Clindamycin hcl, Loratadine, Omeprazole, Penicillins, and Sulfa antibiotics   Review of Systems Review of Systems  Constitutional:  Positive for fatigue and fever. Negative for chills and diaphoresis.  HENT:  Positive for  congestion, postnasal drip and rhinorrhea. Negative for ear pain, sinus pressure, sinus pain and sore throat.   Respiratory:  Positive for cough. Negative for shortness of breath.   Gastrointestinal:  Negative for abdominal pain, nausea and vomiting.  Genitourinary:  Negative for difficulty urinating, dysuria, flank pain and frequency.  Musculoskeletal:  Negative for arthralgias and myalgias.  Skin:  Negative for rash.  Neurological:  Negative for weakness and headaches.  Hematological:  Negative for adenopathy.     Physical Exam Triage Vital Signs ED Triage Vitals  Enc Vitals Group     BP      Pulse      Resp      Temp      Temp src      SpO2      Weight      Height  Head Circumference      Peak Flow      Pain Score      Pain Loc      Pain Edu?      Excl. in [place]?    No data found.  Updated Vital Signs BP 131/89 (BP Location: Left Arm)   Pulse 89   Temp 98.6 F (37 C) (Oral)   Resp 14   Ht '5\' 6"'$  (1.676 m)   Wt 205 lb 14.6 oz (93.4 kg)   LMP  (LMP Unknown)   SpO2 97%   BMI 33.23 kg/m    Physical Exam Vitals and nursing note reviewed.  Constitutional:      General: She is not in acute distress.    Appearance: Normal appearance. She is not ill-appearing or toxic-appearing.  HENT:     Head: Normocephalic and atraumatic.     Nose: Congestion present.     Mouth/Throat:     Mouth: Mucous membranes are moist.     Pharynx: Oropharynx is clear. Posterior oropharyngeal erythema present.  Eyes:     General: No scleral icterus.       Right eye: No discharge.        Left eye: No discharge.     Conjunctiva/sclera: Conjunctivae normal.  Cardiovascular:     Rate and Rhythm: Normal rate and regular rhythm.     Heart sounds: Normal heart sounds.  Pulmonary:     Effort: Pulmonary effort is normal. No respiratory distress.     Breath sounds: Normal breath sounds.  Abdominal:     Palpations: Abdomen is soft.     Tenderness: There is no abdominal tenderness. There is  no right CVA tenderness or left CVA tenderness.  Musculoskeletal:     Cervical back: Neck supple.  Skin:    General: Skin is dry.  Neurological:     General: No focal deficit present.     Mental Status: She is alert. Mental status is at baseline.     Motor: No weakness.     Gait: Gait normal.  Psychiatric:        Mood and Affect: Mood normal.        Behavior: Behavior normal.        Thought Content: Thought content normal.      UC Treatments / Results  Labs (all labs ordered are listed, but only abnormal results are displayed) Labs Reviewed  URINALYSIS, ROUTINE W REFLEX MICROSCOPIC - Abnormal; Notable for the following components:      Result Value   Leukocytes,Ua SMALL (*)    All other components within normal limits  URINALYSIS, MICROSCOPIC (REFLEX) - Abnormal; Notable for the following components:   Bacteria, UA RARE (*)    All other components within normal limits  RESP PANEL BY RT-PCR (RSV, FLU A&B, COVID)  [id number]  URINE CULTURE    EKG   Radiology No results found.  Procedures Procedures (including critical care time)  Medications Ordered in UC Medications - No data to display  Initial Impression / Assessment and Plan / UC Course  I have reviewed the triage vital signs and the nursing notes.  Pertinent labs & imaging results that were available during my care of the patient were reviewed by me and considered in my medical decision making (see chart for details).   62 year old female presents for low-grade temperature, fatigue, cough, congestion for the past couple days.  Reports recent UTI.  Reports symptoms of dysuria confidence and urgency have essentially resolved.  No  flank pain.  Taking Macrobid at this time.  Vitals are normal and stable.  She is overall well-appearing.  On exam she has minimal nasal congestion, mild posterior pharyngeal erythema with clear postnasal drainage.  Chest clear auscultation heart regular rate and rhythm.  Abdomen soft and  nontender.  No CVA tenderness.  Respiratory panel and urinalysis obtained.  Negative respiratory panel.  Urinalysis shows small leukocytes.  Will send urine for culture.  Will have her continue the Macrobid that she is taking and if we need to send something different reason the culture will do so.  Suspect likely has a viral URI and improving UTI.  Advised use of decongestant over-the-counter, rest, fluids.  Reviewed return and ER precautions.   Final Clinical Impressions(s) / UC Diagnoses   Final diagnoses:  Low grade fever  Acute cystitis without hematuria  Acute cough     Discharge Instructions      URI/COLD SYMPTOMS: Your exam today is consistent with a viral illness. Antibiotics are not indicated at this time. Use medications as directed, including cough syrup, nasal saline, and decongestants. Your symptoms should improve over the next few days and resolve within 7-10 days. Increase rest and fluids. F/u if symptoms worsen or predominate such as sore throat, ear pain, productive cough, shortness of breath, or if you develop high fevers or worsening fatigue over the next several days.    UTI: Based on either symptoms or urinalysis, you may have a urinary tract infection. We will send the urine for culture and call with results in a few days. Begin antibiotics at this time. Your symptoms should be much improved over the next 2-3 days. Increase rest and fluid intake. If for some reason symptoms are worsening or not improving after a couple of days or the urine culture determines the antibiotics you are taking will not treat the infection, the antibiotics may be changed. Return or go to ER for fever, back pain, worsening urinary pain, discharge, increased blood in urine. May take Tylenol or Motrin OTC for pain relief or consider AZO if no contraindications      ED Prescriptions   None    PDMP not reviewed this encounter.   Danton Clap, PA-C 12/15/22 1605

## 2022-12-15 NOTE — ED Triage Notes (Signed)
Patient currently being treated for UTI.  Patient reports having low grade fever, fatigue, nasal congestion, sinus drainage and pressure for the past 2 days.  Patient states that her urinary symptoms have improved.

## 2022-12-15 NOTE — Discharge Instructions (Signed)
URI/COLD SYMPTOMS: Your exam today is consistent with a viral illness. Antibiotics are not indicated at this time. Use medications as directed, including cough syrup, nasal saline, and decongestants. Your symptoms should improve over the next few days and resolve within 7-10 days. Increase rest and fluids. F/u if symptoms worsen or predominate such as sore throat, ear pain, productive cough, shortness of breath, or if you develop high fevers or worsening fatigue over the next several days.    UTI: Based on either symptoms or urinalysis, you may have a urinary tract infection. We will send the urine for culture and call with results in a few days. Begin antibiotics at this time. Your symptoms should be much improved over the next 2-3 days. Increase rest and fluid intake. If for some reason symptoms are worsening or not improving after a couple of days or the urine culture determines the antibiotics you are taking will not treat the infection, the antibiotics may be changed. Return or go to ER for fever, back pain, worsening urinary pain, discharge, increased blood in urine. May take Tylenol or Motrin OTC for pain relief or consider AZO if no contraindications

## 2022-12-17 LAB — URINE CULTURE: Culture: NO GROWTH

## 2022-12-26 ENCOUNTER — Encounter: Payer: Self-pay | Admitting: Oncology

## 2022-12-26 ENCOUNTER — Inpatient Hospital Stay: Payer: Commercial Managed Care - PPO | Attending: Oncology | Admitting: Oncology

## 2022-12-26 VITALS — BP 125/79 | HR 84 | Resp 18 | Ht 66.0 in | Wt 206.0 lb

## 2022-12-26 DIAGNOSIS — C50911 Malignant neoplasm of unspecified site of right female breast: Secondary | ICD-10-CM | POA: Diagnosis not present

## 2022-12-26 DIAGNOSIS — Z08 Encounter for follow-up examination after completed treatment for malignant neoplasm: Secondary | ICD-10-CM | POA: Diagnosis not present

## 2022-12-26 DIAGNOSIS — Z170 Estrogen receptor positive status [ER+]: Secondary | ICD-10-CM | POA: Diagnosis not present

## 2022-12-26 DIAGNOSIS — Z853 Personal history of malignant neoplasm of breast: Secondary | ICD-10-CM | POA: Diagnosis not present

## 2022-12-26 DIAGNOSIS — C50912 Malignant neoplasm of unspecified site of left female breast: Secondary | ICD-10-CM | POA: Insufficient documentation

## 2022-12-26 DIAGNOSIS — Z79811 Long term (current) use of aromatase inhibitors: Secondary | ICD-10-CM | POA: Insufficient documentation

## 2022-12-26 DIAGNOSIS — N390 Urinary tract infection, site not specified: Secondary | ICD-10-CM | POA: Insufficient documentation

## 2022-12-26 NOTE — Progress Notes (Signed)
Hematology/Oncology Consult note [facility]  Telephone:([phone] [phone]  Patient Care Team: Idelle Crouch, MD as PCP - General (Internal Medicine) End, Harrell Gave, MD as PCP - Cardiology (Cardiology) Theodore Demark, RN (Inactive) as Oncology Nurse Navigator Noreene Filbert, MD as Radiation Oncologist (Radiation Oncology) Jeral Fruit, RN as Registered Nurse Sindy Guadeloupe, MD as Consulting Physician (Oncology)   Name of the patient: Marissa Phillips  [id number]  1961-07-31   Date of visit: 12/26/22  Diagnosis-history of bilateral breast cancer  Chief complaint/ Reason for visit-routine follow-up of breast cancer on letrozole  Heme/Onc history: Patient is a 62 year old female with history ofRight breast cancer in 2005 ER positive s/p lumpectomy and adjuvant radiation therapy and 5 years of endocrine therapy.  She was found to have bilateral breast masses based on her mammogram and 2018. Patient went for bilateral lumpectomy.  Right breast pathology showed 0.6 cm invasive ductal carcinoma grade 2 with negative margins and left breast showed 1.2 cm invasive lobular carcinoma.  1 sentinel lymph node on the left side was negative for malignancy.  Both these tumors were ER positive and HER-2 negative.  PR negative.   Oncotype testing was sent on both right and left breast specimens and both came back with an Oncotype score of 24 which puts her at intermediate risk for age.  She would not benefit from adjuvant chemotherapy.  She completed adjuvant radiation therapy and started Aromasin in September 2021 patient had problems tolerating Aromasin and was switched to letrozole in January 2023  Patient was enrolled in research trial in the upbeat study for which she had to get cardiac MRI done which incidentally showed a low EF of 40 to 45%.    Interval history-patient was seen by Dr. Saunders Revel for her low EF and will be undergoing further cardiac workup.   She is concerned about her getting frequent UTIs about 2-5 times a year.  She is tolerating letrozole well without any significant side effects  ECOG PS- 1 Pain scale- 0   Review of systems- Review of Systems  Constitutional:  Negative for chills, fever, malaise/fatigue and weight loss.  HENT:  Negative for congestion, ear discharge and nosebleeds.   Eyes:  Negative for blurred vision.  Respiratory:  Negative for cough, hemoptysis, sputum production, shortness of breath and wheezing.   Cardiovascular:  Negative for chest pain, palpitations, orthopnea and claudication.  Gastrointestinal:  Negative for abdominal pain, blood in stool, constipation, diarrhea, heartburn, melena, nausea and vomiting.  Genitourinary:  Negative for dysuria, flank pain, frequency, hematuria and urgency.       Frequent UTIs  Musculoskeletal:  Negative for back pain, joint pain and myalgias.  Skin:  Negative for rash.  Neurological:  Negative for dizziness, tingling, focal weakness, seizures, weakness and headaches.  Endo/Heme/Allergies:  Does not bruise/bleed easily.  Psychiatric/Behavioral:  Negative for depression and suicidal ideas. The patient does not have insomnia.       Allergies  Allergen Reactions   Celebrex [Celecoxib] Rash   Clindamycin Hcl Rash   Loratadine Other (See Comments)    fevers   Omeprazole Other (See Comments)    After 1 week of therapy dry mouth and mild nighttime urinary frequency -bothersome enough for her stop it.    Penicillins Rash    Slight redness and itching    Sulfa Antibiotics Rash     Past Medical History:  Diagnosis Date   Asthma    Breast cancer ([place]) 2005  Cancer [facility])    Breast- Right-2005; bilateral-2021   Cardiomyopathy ([place]) 07/14/2020   LVEF 43.7% by cardiac MRI; LVEF 40-45% with goal hypokinesis on repeat cardiac MRI 09/04/2022 (research protocol through [place])   Hyperlipidemia    Diet controlled   Osteopenia    Personal history of chemotherapy     Personal history of radiation therapy    PONV (postoperative nausea and vomiting)      Past Surgical History:  Procedure Laterality Date   BREAST BIOPSY Right 2005   +   BREAST BIOPSY Right 03/01/2020   [place] bx of mass at 2:00 heart marker,  INVASIVE MAMMARY CARCINOMA, NO   BREAST BIOPSY Left 03/01/2020   [place] bx mass at 3:00, coil marker,  INVASIVE MAMMARY CARCINOMA   BREAST BIOPSY Right 03/01/2020   [place] bx of right axilla, no marker placed,BENIGN LYMPH NODE.   BREAST LUMPECTOMY Right 2005   BREAST LUMPECTOMY WITH NEEDLE LOCALIZATION AND AXILLARY SENTINEL LYMPH NODE BX Right 2005   BREAST LUMPECTOMY WITH RADIOACTIVE SEED AND SENTINEL LYMPH NODE BIOPSY Bilateral 04/14/2020   Procedure: BILATERAL RADIOACTIVE SEED GUIDED BREAST LUMPECTOMIES, BILATERAL AXILLARY SENTINEL LYMPH NODE BIOPSIES, WITH BLUE DYE INJECTION RIGHT BREAST;  Surgeon: Alphonsa Overall, MD;  Location: [place];  Service: General;  Laterality: Bilateral;   BREAST SURGERY     COLONOSCOPY WITH PROPOFOL N/A 04/02/2017   Procedure: COLONOSCOPY WITH PROPOFOL;  Surgeon: Jonathon Bellows, MD;  Location: [facility] ENDOSCOPY;  Service: Endoscopy;  Laterality: N/A;   COLONOSCOPY WITH PROPOFOL N/A 08/29/2022   Procedure: COLONOSCOPY WITH PROPOFOL;  Surgeon: Toledo, [name], MD;  Location: [facility] ENDOSCOPY;  Service: Gastroenterology;  Laterality: N/A;   ESOPHAGOGASTRODUODENOSCOPY (EGD) WITH PROPOFOL N/A 04/02/2017   Procedure: ESOPHAGOGASTRODUODENOSCOPY (EGD) WITH PROPOFOL;  Surgeon: Jonathon Bellows, MD;  Location: [facility] ENDOSCOPY;  Service: Endoscopy;  Laterality: N/A;   TONSILLECTOMY  ~2000    Social History   Socioeconomic History   Marital status: Married    Spouse name: Not on file   Number of children: Not on file   Years of education: Not on file   Highest education level: Not on file  Occupational History   Not on file  Tobacco Use   Smoking status: Never   Smokeless tobacco: Never  Vaping Use   Vaping Use: Never used  Substance and  Sexual Activity   Alcohol use: No   Drug use: No   Sexual activity: Not Currently  Other Topics Concern   Not on file  Social History Narrative   Not on file   Social Determinants of Health   Financial Resource Strain: Not on file  Food Insecurity: Not on file  Transportation Needs: Not on file  Physical Activity: Not on file  Stress: Not on file  Social Connections: Not on file  Intimate Partner Violence: Not on file    Family History  Problem Relation Age of Onset   Atrial fibrillation Mother    Hypertension Mother    Cancer Mother 21       Breast, now bilateral   Breast cancer Mother 26       right breast ca then left breast ca    Leukemia Mother    Hypertension Father    Hepatitis Father    Heart failure Father    Hypertension Sister      Current Outpatient Medications:    Calcium Carbonate (CALCIUM 500 PO), Take 1 tablet by mouth daily., Disp: , Rfl:    carvedilol (COREG) 3.125 MG tablet, Take 1 tablet (  3.125 mg total) by mouth 2 (two) times daily., Disp: 180 tablet, Rfl: 3   letrozole (FEMARA) 2.5 MG tablet, Take 1 tablet (2.5 mg total) by mouth daily., Disp: 90 tablet, Rfl: 1   NON FORMULARY, [place] TAIL MUSHROOM--1 capsule 2x a day/, Disp: , Rfl:    acetaminophen (TYLENOL) 500 MG tablet, Take 500-1,000 mg by mouth 2 (two) times daily as needed (pain.). (Patient not taking: Reported on 12/26/2022), Disp: , Rfl:    cetirizine (ZYRTEC) 10 MG tablet, Take 10 mg by mouth daily as needed. (Patient not taking: Reported on 12/26/2022), Disp: , Rfl:    famotidine (PEPCID) 20 MG tablet, Take 20 mg by mouth daily as needed for heartburn or indigestion. (Patient not taking: Reported on 12/26/2022), Disp: , Rfl:    fluticasone (FLONASE) 50 MCG/ACT nasal spray, Place into both nostrils. (Patient not taking: Reported on 12/26/2022), Disp: 16 g, Rfl: 6   melatonin 5 MG TABS, 5 mg at bedtime as needed. (Patient not taking: Reported on 12/26/2022), Disp: , Rfl:    naproxen sodium  (ALEVE) 220 MG tablet, Take 220-440 mg by mouth daily as needed (pain.). (Patient not taking: Reported on 12/26/2022), Disp: , Rfl:   Physical exam:  Vitals:   12/26/22 1133  BP: 125/79  Pulse: 84  Resp: 18  SpO2: 99%  Weight: 206 lb (93.4 kg)  Height: '5\' 6"'$  (1.676 m)   Physical Exam Cardiovascular:     Rate and Rhythm: Normal rate and regular rhythm.     Heart sounds: Normal heart sounds.  Pulmonary:     Effort: Pulmonary effort is normal.     Breath sounds: Normal breath sounds.  Abdominal:     General: Bowel sounds are normal.     Palpations: Abdomen is soft.  Skin:    General: Skin is warm and dry.  Neurological:     Mental Status: She is alert and oriented to person, place, and time.   Breast exam: Patient is s/p bilateral lumpectomy with a well-healed surgical scar.  No palpable masses in either breast.  No palpable bilateral axillary adenopathy.     Latest Ref Rng & Units 12/25/2021   10:41 AM  CMP  Glucose 70 - 99 mg/dL 100   BUN 6 - 20 mg/dL 12   Creatinine 0.44 - 1.00 mg/dL 0.91   Sodium 135 - 145 mmol/L 137   Potassium 3.5 - 5.1 mmol/L 3.9   Chloride 98 - 111 mmol/L 102   CO2 22 - 32 mmol/L 28   Calcium 8.9 - 10.3 mg/dL 9.6   Total Protein 6.5 - 8.1 g/dL 7.8   Total Bilirubin 0.3 - 1.2 mg/dL 0.5   Alkaline Phos 38 - 126 U/L 96   AST 15 - 41 U/L 28   ALT 0 - 44 U/L 29       Latest Ref Rng & Units 12/25/2021   10:41 AM  CBC  WBC 4.0 - 10.5 K/uL 5.5   Hemoglobin 12.0 - 15.0 g/dL 14.1   Hematocrit 36.0 - 46.0 % 43.2   Platelets 150 - 400 K/uL 239      Assessment and plan- Patient is a 62 y.o. female with history of bilateral breast cancer ER positive currently on letrozole.  She is here for routine follow-up  Clinically patient is doing well with no concerning signs and symptoms of recurrence based on today's exam.  I will see her back in 6 months no labs.  She is scheduled for a mammogram in  March 2024.  Low ejection fraction: Incidentally noted on  cardiac MRI that was done as a part of complete study which showed an EF of 40 to 45%.  She is following up with Dr. Saunders Revel from cardiology for this.  Frequent UTIs: I will refer her to Dr. Bernardo Heater who she has seen in the past.  If urology recommends vaginal estrogen it would be okay for her to take it since she is having frequent UTIs.  I did explain to her small and unpredictable risk of systemic estrogen absorption with vaginal estrogen but if that is the only option then it would be okay for her to take it as long as she is on letrozole.   Visit Diagnosis 1. Encounter for follow-up surveillance of breast cancer   2. Frequent UTI   3. Use of letrozole (Femara)      Dr. Randa Evens, MD, MPH [facility] at [facility] [phone] 12/26/2022 1:58 PM

## 2022-12-26 NOTE — Progress Notes (Signed)
Cardiology Office Note    Date:  01/01/2023   ID:  Marissa Phillips, DOB Mar 28, 1961, MRN [id number]  PCP:  Idelle Crouch, MD  Cardiologist:  Nelva Bush, MD  Electrophysiologist:  None   Chief Complaint: Follow up  History of Present Illness:   Marissa Phillips is a 62 y.o. female with history of recurrent breast cancer initially treated with lumpectomy and chemoradiation right breast cancer in 2005 with her chemotherapy regimen including Adriamycin with recurrent bilateral breast cancer in 2021 managed with lumpectomies and radiation followed by aromatase inhibitor therapy, cardiomyopathy, HLD, and frequent UTIs who presents for follow-up of echo.  She was evaluated as a new patient by Dr. Saunders Revel on 11/07/2022 at the request of her PCP for evaluation of abnormal cardiac MRI.  It was noted at that time she had been participating in a research study through [facility] that had included serial cardiac MRIs.  She brought a limited report with her to her initial visit with Dr. Saunders Revel that indicated a suspected LVEF of 40 to 45% on a study in 08/2022.  There was concern that an initial scan in 06/2020 showed an EF of 43.7%.  Repeat a scan in 09/2020 did not make note of any abnormalities.  At her visit with Dr. Saunders Revel, she denied a history of heart problems and was without symptoms of angina.  She did note occasional shortness of breath but attributed this mostly to rhinorrhea and sinus congestion that she attributed to aromatase inhibitors.  She was without difficulties walking on a treadmill with her husband.  She did note some increase in ankle swelling over the past several months.  To further evaluate her cardiomyopathy, she underwent echo on 12/28/2022 showed an EF 50 to 55%, no regional wall motion abnormalities, grade 1 diastolic dysfunction, normal RV systolic function and ventricular cavity size, mild mitral regurgitation, and an estimated right atrial pressure of 3 mmHg.  She comes in doing well  from a cardiac perspective with symptoms of angina or decompensation.  No progressive dyspnea, orthopnea, PND, or early satiety.  She has been working on lifestyle modification with mindful eating and is now wearing compression socks.  She reports a prior history of exertional dyspnea when walking up an incline if walking outdoors, which has been attributed to history of sinusitis.  She reports having previously been able to ambulate on a treadmill and a gym, with an incline, without symptoms of dyspnea or angina.  She plans on rejoining her husband in the gym moving forward.  No dizziness, presyncope, or syncope.  Overall, she feels like she is doing well from a cardiac perspective.   Labs independently reviewed: 01/2022 - Hgb 13.2, PLT 228, TC 191, TG 131, HDL 60, LDL 104, potassium 4.3, BUN 14, serum creatinine 0.9, albumin 4.0, AST/ALT normal, TSH normal, A1c 5.9  Past Medical History:  Diagnosis Date   Asthma    Breast cancer ([place]) 2005   Cancer ([place])    Breast- Right-2005; bilateral-2021   Cardiomyopathy ([place]) 07/14/2020   LVEF 43.7% by cardiac MRI; LVEF 40-45% with goal hypokinesis on repeat cardiac MRI 09/04/2022 (research protocol through [place])   Hyperlipidemia    Diet controlled   Osteopenia    Personal history of chemotherapy    Personal history of radiation therapy    PONV (postoperative nausea and vomiting)     Past Surgical History:  Procedure Laterality Date   BREAST BIOPSY Right 2005   +   BREAST BIOPSY Right  03/01/2020   [place] bx of mass at 2:00 heart marker,  INVASIVE MAMMARY CARCINOMA, NO   BREAST BIOPSY Left 03/01/2020   [place] bx mass at 3:00, coil marker,  INVASIVE MAMMARY CARCINOMA   BREAST BIOPSY Right 03/01/2020   [place] bx of right axilla, no marker placed,BENIGN LYMPH NODE.   BREAST LUMPECTOMY Right 2005   BREAST LUMPECTOMY WITH NEEDLE LOCALIZATION AND AXILLARY SENTINEL LYMPH NODE BX Right 2005   BREAST LUMPECTOMY WITH RADIOACTIVE SEED AND SENTINEL LYMPH NODE BIOPSY  Bilateral 04/14/2020   Procedure: BILATERAL RADIOACTIVE SEED GUIDED BREAST LUMPECTOMIES, BILATERAL AXILLARY SENTINEL LYMPH NODE BIOPSIES, WITH BLUE DYE INJECTION RIGHT BREAST;  Surgeon: Alphonsa Overall, MD;  Location: [place];  Service: General;  Laterality: Bilateral;   BREAST SURGERY     COLONOSCOPY WITH PROPOFOL N/A 04/02/2017   Procedure: COLONOSCOPY WITH PROPOFOL;  Surgeon: Jonathon Bellows, MD;  Location: [facility] ENDOSCOPY;  Service: Endoscopy;  Laterality: N/A;   COLONOSCOPY WITH PROPOFOL N/A 08/29/2022   Procedure: COLONOSCOPY WITH PROPOFOL;  Surgeon: Toledo, [name], MD;  Location: [facility] ENDOSCOPY;  Service: Gastroenterology;  Laterality: N/A;   ESOPHAGOGASTRODUODENOSCOPY (EGD) WITH PROPOFOL N/A 04/02/2017   Procedure: ESOPHAGOGASTRODUODENOSCOPY (EGD) WITH PROPOFOL;  Surgeon: Jonathon Bellows, MD;  Location: [facility] ENDOSCOPY;  Service: Endoscopy;  Laterality: N/A;   TONSILLECTOMY  ~2000    Current Medications: Current Meds  Medication Sig   acetaminophen (TYLENOL) 500 MG tablet Take 500-1,000 mg by mouth 2 (two) times daily as needed (pain.).   Calcium Carbonate (CALCIUM 500 PO) Take 1 tablet by mouth daily.   carvedilol (COREG) 3.125 MG tablet Take 1 tablet (3.125 mg total) by mouth 2 (two) times daily.   cetirizine (ZYRTEC) 10 MG tablet Take 10 mg by mouth daily as needed.   CRANBERRY PO Take 1 capsule by mouth daily.   famotidine (PEPCID) 20 MG tablet Take 20 mg by mouth daily as needed for heartburn or indigestion.   fluticasone (FLONASE) 50 MCG/ACT nasal spray Place into both nostrils.   letrozole (FEMARA) 2.5 MG tablet Take 1 tablet (2.5 mg total) by mouth daily.   melatonin 5 MG TABS 5 mg at bedtime as needed.   naproxen sodium (ALEVE) 220 MG tablet Take 220-440 mg by mouth daily as needed (pain.).   NON FORMULARY [place] TAIL MUSHROOM--1 capsule 2x a day/   Vitamin D, Cholecalciferol, 25 MCG (1000 UT) CAPS Take 1 capsule by mouth daily.    Allergies:   Celebrex [celecoxib], Clindamycin hcl,  Loratadine, Omeprazole, Penicillins, and Sulfa antibiotics   Social History   Socioeconomic History   Marital status: Married    Spouse name: Not on file   Number of children: Not on file   Years of education: Not on file   Highest education level: Not on file  Occupational History   Not on file  Tobacco Use   Smoking status: Never   Smokeless tobacco: Never  Vaping Use   Vaping Use: Never used  Substance and Sexual Activity   Alcohol use: No   Drug use: No   Sexual activity: Not Currently  Other Topics Concern   Not on file  Social History Narrative   Not on file   Social Determinants of Health   Financial Resource Strain: Not on file  Food Insecurity: Not on file  Transportation Needs: Not on file  Physical Activity: Not on file  Stress: Not on file  Social Connections: Not on file     Family History:  The patient's family history includes Atrial fibrillation  in her mother; Breast cancer (age of onset: 34) in her mother; Cancer (age of onset: 57) in her mother; Heart failure in her father; Hepatitis in her father; Hypertension in her father, mother, and sister; Leukemia in her mother.  ROS:   12-point review of systems is negative unless otherwise noted in the HPI.   EKGs/Labs/Other Studies Reviewed:    Studies reviewed were summarized above. The additional studies were reviewed today:  2D echo 12/28/2022: 1. Left ventricular ejection fraction, by estimation, is 50 to 55%. The  left ventricle has low normal function. The left ventricle has no regional  wall motion abnormalities. Left ventricular diastolic parameters are  consistent with Grade I diastolic  dysfunction (impaired relaxation).   2. Right ventricular systolic function is normal. The right ventricular  size is normal. Tricuspid regurgitation signal is inadequate for assessing  PA pressure.   3. The mitral valve is normal in structure. Mild mitral valve  regurgitation. No evidence of mitral stenosis.    4. The aortic valve is tricuspid. Aortic valve regurgitation is not  visualized. No aortic stenosis is present.   5. The inferior vena cava is normal in size with greater than 50%  respiratory variability, suggesting right atrial pressure of 3 mmHg.     EKG:  EKG is not ordered today.    Recent Labs: No results found for requested labs within last 365 days.  Recent Lipid Panel No results found for: "CHOL", "TRIG", "HDL", "CHOLHDL", "VLDL", "LDLCALC", "LDLDIRECT"  PHYSICAL EXAM:    VS:  BP 112/86 (BP Location: Left Arm, Patient Position: Sitting, Cuff Size: Large)   Pulse 88   Ht '5\' 6"'$  (1.676 m)   Wt 205 lb (93 kg)   LMP  (LMP Unknown)   SpO2 97%   BMI 33.09 kg/m   BMI: Body mass index is 33.09 kg/m.  Physical Exam Vitals reviewed.  Constitutional:      Appearance: She is well-developed.  HENT:     Head: Normocephalic and atraumatic.  Eyes:     General:        Right eye: No discharge.        Left eye: No discharge.  Neck:     Vascular: No JVD.  Cardiovascular:     Rate and Rhythm: Normal rate and regular rhythm.     Pulses:          Posterior tibial pulses are 2+ on the right side and 2+ on the left side.     Heart sounds: Normal heart sounds, S1 normal and S2 normal. Heart sounds not distant. No midsystolic click and no opening snap. No murmur heard.    No friction rub.  Pulmonary:     Effort: Pulmonary effort is normal. No respiratory distress.     Breath sounds: Normal breath sounds. No decreased breath sounds, wheezing or rales.  Chest:     Chest wall: No tenderness.  Abdominal:     General: There is no distension.  Musculoskeletal:     Cervical back: Normal range of motion.     Right lower leg: No edema.     Left lower leg: No edema.  Skin:    General: Skin is warm and dry.     Nails: There is no clubbing.  Neurological:     Mental Status: She is alert and oriented to person, place, and time.  Psychiatric:        Speech: Speech normal.         Behavior: Behavior  normal.        Thought Content: Thought content normal.        Judgment: Judgment normal.     Wt Readings from Last 3 Encounters:  01/01/23 205 lb (93 kg)  12/26/22 206 lb (93.4 kg)  12/15/22 205 lb 14.6 oz (93.4 kg)     ASSESSMENT & PLAN:   HFimpEF: Euvolemic and well compensated.  Recent echo showed low normal LV systolic function.  She remains on carvedilol.  Not requiring a standing loop diuretic.  We did discuss further risk stratification via calcium score versus Lexiscan MPI versus coronary CTA.  Given that the patient is without symptoms of angina or cardiac decompensation, we have elected to defer further testing at this time.  Should she note progressive dyspnea or new onset angina she will notify our office.  HLD: LDL 104 in 01/2022.  Follow-up with PCP.  Recurrent breast cancer: Per oncology.   Disposition: F/u with Dr. Saunders Revel or an APP in 6 months.   Medication Adjustments/Labs and Tests Ordered: Current medicines are reviewed at length with the patient today.  Concerns regarding medicines are outlined above. Medication changes, Labs and Tests ordered today are summarized above and listed in the Patient Instructions accessible in Encounters.   Signed, Christell Faith, PA-C 01/01/2023 12:19 PM     [facility] [address] [place] [address] [place], [place] [address] [phone]

## 2022-12-28 ENCOUNTER — Ambulatory Visit: Payer: Commercial Managed Care - PPO | Attending: Internal Medicine

## 2022-12-28 DIAGNOSIS — I429 Cardiomyopathy, unspecified: Secondary | ICD-10-CM | POA: Diagnosis not present

## 2022-12-28 LAB — ECHOCARDIOGRAM COMPLETE
AR max vel: 3.76 cm2
AV Area VTI: 3.36 cm2
AV Area mean vel: 3.11 cm2
AV Mean grad: 2 mmHg
AV Peak grad: 3.2 mmHg
Ao pk vel: 0.89 m/s
Area-P 1/2: 2.39 cm2
Calc EF: 49.6 %
S' Lateral: 3 cm
Single Plane A2C EF: 47.4 %
Single Plane A4C EF: 52.1 %

## 2022-12-28 MED ORDER — PERFLUTREN LIPID MICROSPHERE
1.0000 mL | INTRAVENOUS | Status: AC | PRN
  Administered 2022-12-28: 2 mL via INTRAVENOUS

## 2023-01-01 ENCOUNTER — Ambulatory Visit: Payer: Commercial Managed Care - PPO | Attending: Physician Assistant | Admitting: Physician Assistant

## 2023-01-01 ENCOUNTER — Encounter: Payer: Self-pay | Admitting: Physician Assistant

## 2023-01-01 VITALS — BP 112/86 | HR 88 | Ht 66.0 in | Wt 205.0 lb

## 2023-01-01 DIAGNOSIS — I5022 Chronic systolic (congestive) heart failure: Secondary | ICD-10-CM | POA: Diagnosis not present

## 2023-01-01 DIAGNOSIS — C50912 Malignant neoplasm of unspecified site of left female breast: Secondary | ICD-10-CM

## 2023-01-01 DIAGNOSIS — C50911 Malignant neoplasm of unspecified site of right female breast: Secondary | ICD-10-CM

## 2023-01-01 DIAGNOSIS — E785 Hyperlipidemia, unspecified: Secondary | ICD-10-CM | POA: Diagnosis not present

## 2023-01-01 NOTE — Patient Instructions (Signed)
Medication Instructions:  No changes at this time.   *If you need a refill on your cardiac medications before your next appointment, please call your pharmacy*   Lab Work: None  If you have labs (blood work) drawn today and your tests are completely normal, you will receive your results only by: MyChart Message (if you have MyChart) OR A paper copy in the mail If you have any lab test that is abnormal or we need to change your treatment, we will call you to review the results.   Testing/Procedures: None   Follow-Up: At [facility], you and your health needs are our priority.  As part of our continuing mission to provide you with exceptional heart care, we have created designated Provider Care Teams.  These Care Teams include your primary Cardiologist (physician) and Advanced Practice Providers (APPs -  Physician Assistants and Nurse Practitioners) who all work together to provide you with the care you need, when you need it.  Your next appointment:   6 month(s)  Provider:   Christopher End, MD or Ryan Dunn, PA-C     

## 2023-01-14 DIAGNOSIS — E785 Hyperlipidemia, unspecified: Secondary | ICD-10-CM | POA: Diagnosis not present

## 2023-01-14 DIAGNOSIS — M8580 Other specified disorders of bone density and structure, unspecified site: Secondary | ICD-10-CM | POA: Diagnosis not present

## 2023-01-14 DIAGNOSIS — K219 Gastro-esophageal reflux disease without esophagitis: Secondary | ICD-10-CM | POA: Diagnosis not present

## 2023-01-14 DIAGNOSIS — Z79899 Other long term (current) drug therapy: Secondary | ICD-10-CM | POA: Diagnosis not present

## 2023-01-14 DIAGNOSIS — Z0000 Encounter for general adult medical examination without abnormal findings: Secondary | ICD-10-CM | POA: Diagnosis not present

## 2023-01-14 DIAGNOSIS — C50012 Malignant neoplasm of nipple and areola, left female breast: Secondary | ICD-10-CM | POA: Diagnosis not present

## 2023-01-14 DIAGNOSIS — R7309 Other abnormal glucose: Secondary | ICD-10-CM | POA: Diagnosis not present

## 2023-01-14 DIAGNOSIS — C50011 Malignant neoplasm of nipple and areola, right female breast: Secondary | ICD-10-CM | POA: Diagnosis not present

## 2023-01-14 DIAGNOSIS — E559 Vitamin D deficiency, unspecified: Secondary | ICD-10-CM | POA: Diagnosis not present

## 2023-01-17 ENCOUNTER — Encounter: Payer: Self-pay | Admitting: Urology

## 2023-01-17 ENCOUNTER — Ambulatory Visit: Payer: Commercial Managed Care - PPO | Admitting: Urology

## 2023-01-17 ENCOUNTER — Other Ambulatory Visit: Payer: Self-pay

## 2023-01-17 VITALS — BP 109/73 | HR 97 | Ht 66.0 in | Wt 203.0 lb

## 2023-01-17 DIAGNOSIS — N390 Urinary tract infection, site not specified: Secondary | ICD-10-CM

## 2023-01-17 DIAGNOSIS — Z87440 Personal history of urinary (tract) infections: Secondary | ICD-10-CM | POA: Diagnosis not present

## 2023-01-17 LAB — MICROSCOPIC EXAMINATION: Bacteria, UA: NONE SEEN

## 2023-01-17 LAB — URINALYSIS, COMPLETE
Bilirubin, UA: NEGATIVE
Glucose, UA: NEGATIVE
Ketones, UA: NEGATIVE
Leukocytes,UA: NEGATIVE
Nitrite, UA: NEGATIVE
Protein,UA: NEGATIVE
Specific Gravity, UA: 1.01 (ref 1.005–1.030)
Urobilinogen, Ur: 0.2 mg/dL (ref 0.2–1.0)
pH, UA: 7 (ref 5.0–7.5)

## 2023-01-17 MED ORDER — NITROFURANTOIN MONOHYD MACRO 100 MG PO CAPS
100.0000 mg | ORAL_CAPSULE | Freq: Two times a day (BID) | ORAL | 1 refills | Status: DC
  Filled 2023-01-17: qty 30, fill #0
  Filled 2023-07-14: qty 30, 15d supply, fill #0
  Filled 2023-12-23: qty 30, 15d supply, fill #1

## 2023-01-17 MED ORDER — NITROFURANTOIN MONOHYD MACRO 100 MG PO CAPS
100.0000 mg | ORAL_CAPSULE | Freq: Every day | ORAL | 3 refills | Status: DC
  Filled 2023-01-17: qty 30, 30d supply, fill #0

## 2023-01-17 NOTE — Progress Notes (Signed)
Additionally  01/17/2023 10:53 AM   Alvis Lemmings Apr 05, 1961 [id number]  Referring provider: Idelle Crouch, MD [facility] [place],  [place] [address]  Chief Complaint  Patient presents with   Recurrent UTI    HPI: Marissa Phillips is a 62 y.o. female referred for evaluation of recurrent UTIs.  History of recurrent breast cancer and since starting letrozole has had 4 UTIs in the last year Symptoms typically consist of bladder pressure/discomfort, frequency and sensation of incomplete emptying.  Symptoms resolve after antibiotic therapy.  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  She has only had 1 urine culture which was negative No gross hematuria She would like to avoid low-dose vaginal estrogen if possible due to her history of right-sided breast cancer 2005 and bilateral breast cancer in 2021 She is taking cranberry supplement No pyelonephritis or febrile UTIs   PMH: Past Medical History:  Diagnosis Date   Asthma    Breast cancer ([place]) 2005   Cancer ([place])    Breast- Right-2005; bilateral-2021   Cardiomyopathy ([place]) 07/14/2020   LVEF 43.7% by cardiac MRI; LVEF 40-45% with goal hypokinesis on repeat cardiac MRI 09/04/2022 (research protocol through [place])   Hyperlipidemia    Diet controlled   Osteopenia    Personal history of chemotherapy    Personal history of radiation therapy    PONV (postoperative nausea and vomiting)     Surgical History: Past Surgical History:  Procedure Laterality Date   BREAST BIOPSY Right 2005   +   BREAST BIOPSY Right 03/01/2020   [place] bx of mass at 2:00 heart marker,  INVASIVE MAMMARY CARCINOMA, NO   BREAST BIOPSY Left 03/01/2020   [place] bx mass at 3:00, coil marker,  INVASIVE MAMMARY CARCINOMA   BREAST BIOPSY Right 03/01/2020   [place] bx of right axilla, no marker placed,BENIGN LYMPH NODE.    BREAST LUMPECTOMY Right 2005   BREAST LUMPECTOMY WITH NEEDLE LOCALIZATION AND AXILLARY SENTINEL LYMPH NODE BX Right 2005   BREAST LUMPECTOMY WITH RADIOACTIVE SEED AND SENTINEL LYMPH NODE BIOPSY Bilateral 04/14/2020   Procedure: BILATERAL RADIOACTIVE SEED GUIDED BREAST LUMPECTOMIES, BILATERAL AXILLARY SENTINEL LYMPH NODE BIOPSIES, WITH BLUE DYE INJECTION RIGHT BREAST;  Surgeon: Alphonsa Overall, MD;  Location: [place];  Service: General;  Laterality: Bilateral;   BREAST SURGERY     COLONOSCOPY WITH PROPOFOL N/A 04/02/2017   Procedure: COLONOSCOPY WITH PROPOFOL;  Surgeon: Jonathon Bellows, MD;  Location: [facility] ENDOSCOPY;  Service: Endoscopy;  Laterality: N/A;   COLONOSCOPY WITH PROPOFOL N/A 08/29/2022   Procedure: COLONOSCOPY WITH PROPOFOL;  Surgeon: Toledo, [name], MD;  Location: [facility] ENDOSCOPY;  Service: Gastroenterology;  Laterality: N/A;   ESOPHAGOGASTRODUODENOSCOPY (EGD) WITH PROPOFOL N/A 04/02/2017   Procedure: ESOPHAGOGASTRODUODENOSCOPY (EGD) WITH PROPOFOL;  Surgeon: Jonathon Bellows, MD;  Location: [facility] ENDOSCOPY;  Service: Endoscopy;  Laterality: N/A;   TONSILLECTOMY  ~2000    Home Medications:  Allergies as of 01/17/2023       Reactions   Celebrex [celecoxib] Rash   Clindamycin Hcl Rash   Loratadine Other (See Comments)   fevers   Omeprazole Other (See Comments)   After 1 week of therapy dry mouth and mild nighttime urinary frequency -bothersome enough for her stop it.    Penicillins Rash   Slight redness and itching   Sulfa Antibiotics Rash        Medication List  Accurate as of January 17, 2023 10:53 AM. If you have any questions, ask your nurse or doctor.          STOP taking these medications    fluticasone 50 MCG/ACT nasal spray Commonly known as: FLONASE Stopped by: Abbie Sons, MD       TAKE these medications    acetaminophen 500 MG tablet Commonly known as: TYLENOL Take 500-1,000 mg by mouth 2 (two) times daily as needed (pain.).   CALCIUM 500  PO Take 1 tablet by mouth 2 (two) times daily.   carvedilol 3.125 MG tablet Commonly known as: COREG Take 1 tablet (3.125 mg total) by mouth 2 (two) times daily.   cetirizine 10 MG tablet Commonly known as: ZYRTEC Take 10 mg by mouth daily as needed.   CRANBERRY PO Take 1 capsule by mouth 2 (two) times daily.   famotidine 20 MG tablet Commonly known as: PEPCID Take 20 mg by mouth daily as needed for heartburn or indigestion.   letrozole 2.5 MG tablet Commonly known as: FEMARA Take 1 tablet (2.5 mg total) by mouth daily.   melatonin 5 MG Tabs 5 mg at bedtime as needed.   naproxen sodium 220 MG tablet Commonly known as: ALEVE Take 220-440 mg by mouth daily as needed (pain.).   NON FORMULARY [place] TAIL MUSHROOM--1 capsule 2x a day/   Vitamin D (Cholecalciferol) 25 MCG (1000 UT) Caps Take 1 capsule by mouth daily.        Allergies:  Allergies  Allergen Reactions   Celebrex [Celecoxib] Rash   Clindamycin Hcl Rash   Loratadine Other (See Comments)    fevers   Omeprazole Other (See Comments)    After 1 week of therapy dry mouth and mild nighttime urinary frequency -bothersome enough for her stop it.    Penicillins Rash    Slight redness and itching    Sulfa Antibiotics Rash    Family History: Family History  Problem Relation Age of Onset   Atrial fibrillation Mother    Hypertension Mother    Cancer Mother 61       Breast, now bilateral   Breast cancer Mother 71       right breast ca then left breast ca    Leukemia Mother    Hypertension Father    Hepatitis Father    Heart failure Father    Hypertension Sister     Social History:  reports that she has never smoked. She has never used smokeless tobacco. She reports that she does not drink alcohol and does not use drugs.   Physical Exam: BP 109/73   Pulse 97   Ht '5\' 6"'$  (1.676 m)   Wt 203 lb (92.1 kg)   LMP  (LMP Unknown)   BMI 32.77 kg/m   Constitutional:  Alert, No acute distress. HEENT: [place]  AT Respiratory: Normal respiratory effort, no increased work of breathing. Psychiatric: Normal mood and affect.  Laboratory Data:  Urinalysis Pending   Assessment & Plan:    1.  Recurrent UTI Although vaginal estrogen is generally considered safe and estrogen receptor positive breast cancer patients Dr. Janese Banks did discuss the potential unpredictable risk of systemic estrogen absorption with vaginal estrogen therapy.  She would prefer other options rather than taking low-dose vaginal estrogen Since she is only having UTIs every 3-4 months we discussed demand therapy with a course of Macrobid at symptom onset after verifying abnormal UA on dipstick.  Daily low-dose antibiotic prophylaxis was also discussed We also  discussed the use of D-mannose for possible UTI prevention and treatment She has elected to take d-mannose and would like to also start demand therapy Follow-up annually and she was instructed to call if her symptoms do not respond to antibiotic therapy   Abbie Sons, [place] [address] [place], [place] [address] [phone]

## 2023-01-30 DIAGNOSIS — E559 Vitamin D deficiency, unspecified: Secondary | ICD-10-CM | POA: Diagnosis not present

## 2023-01-30 DIAGNOSIS — R7309 Other abnormal glucose: Secondary | ICD-10-CM | POA: Diagnosis not present

## 2023-01-30 DIAGNOSIS — E785 Hyperlipidemia, unspecified: Secondary | ICD-10-CM | POA: Diagnosis not present

## 2023-01-30 DIAGNOSIS — Z79899 Other long term (current) drug therapy: Secondary | ICD-10-CM | POA: Diagnosis not present

## 2023-01-31 DIAGNOSIS — H524 Presbyopia: Secondary | ICD-10-CM | POA: Diagnosis not present

## 2023-01-31 DIAGNOSIS — H5213 Myopia, bilateral: Secondary | ICD-10-CM | POA: Diagnosis not present

## 2023-02-05 ENCOUNTER — Encounter: Payer: Self-pay | Admitting: Urology

## 2023-02-18 ENCOUNTER — Ambulatory Visit
Admission: RE | Admit: 2023-02-18 | Discharge: 2023-02-18 | Disposition: A | Discharge status: Home / Self Care | Payer: Commercial Managed Care - PPO | Source: Ambulatory Visit | Attending: Oncology | Admitting: Oncology

## 2023-02-18 DIAGNOSIS — Z853 Personal history of malignant neoplasm of breast: Secondary | ICD-10-CM | POA: Insufficient documentation

## 2023-02-18 DIAGNOSIS — Z1231 Encounter for screening mammogram for malignant neoplasm of breast: Secondary | ICD-10-CM | POA: Diagnosis not present

## 2023-02-18 DIAGNOSIS — Z08 Encounter for follow-up examination after completed treatment for malignant neoplasm: Secondary | ICD-10-CM | POA: Diagnosis not present

## 2023-03-05 ENCOUNTER — Encounter: Payer: Self-pay | Admitting: Oncology

## 2023-03-27 DIAGNOSIS — M79672 Pain in left foot: Secondary | ICD-10-CM | POA: Diagnosis not present

## 2023-03-27 DIAGNOSIS — M7672 Peroneal tendinitis, left leg: Secondary | ICD-10-CM | POA: Diagnosis not present

## 2023-04-10 ENCOUNTER — Encounter: Payer: Self-pay | Admitting: Internal Medicine

## 2023-04-12 ENCOUNTER — Encounter: Payer: Self-pay | Admitting: Oncology

## 2023-04-25 ENCOUNTER — Ambulatory Visit: Payer: Commercial Managed Care - PPO | Admitting: Physician Assistant

## 2023-05-02 ENCOUNTER — Encounter: Payer: Self-pay | Admitting: Oncology

## 2023-05-03 ENCOUNTER — Other Ambulatory Visit: Payer: Self-pay

## 2023-05-03 ENCOUNTER — Other Ambulatory Visit: Payer: Self-pay | Admitting: Oncology

## 2023-05-03 MED ORDER — LETROZOLE 2.5 MG PO TABS
2.5000 mg | ORAL_TABLET | Freq: Every day | ORAL | 1 refills | Status: DC
  Filled 2023-05-03: qty 60, 60d supply, fill #0
  Filled 2023-07-14: qty 60, 60d supply, fill #1
  Filled 2023-07-16: qty 90, 90d supply, fill #1
  Filled 2023-10-07: qty 30, 30d supply, fill #2

## 2023-05-24 NOTE — Progress Notes (Unsigned)
Cardiology Office Note    Date:  05/27/2023   ID:  Marissa Phillips, DOB 1961/03/11, MRN [id number]  PCP:  Marguarite Arbour, MD  Cardiologist:  Yvonne Kendall, MD  Electrophysiologist:  None   Chief Complaint: Follow up  History of Present Illness:   Marissa Phillips is a 62 y.o. female with history of recurrent breast cancer initially treated with lumpectomy and chemoradiation right breast cancer in 2005 with her chemotherapy regimen including Adriamycin with recurrent bilateral breast cancer in 2021 managed with lumpectomies and radiation followed by aromatase inhibitor therapy, cardiomyopathy, HLD, and frequent UTIs who presents for follow-up of HFimpEF.   She was evaluated as a new patient by Dr. Okey Dupre on 11/07/2022 at the request of her PCP for evaluation of abnormal cardiac MRI.  It was noted at that time she had been participating in a research study through [facility] that had included serial cardiac MRIs.  She brought a limited report with her to her initial visit with Dr. Okey Dupre that indicated a suspected LVEF of 40 to 45% on a study in 08/2022.  There was concern that an initial scan in 06/2020 showed an EF of 43.7%.  Repeat a scan in 09/2020 did not make note of any abnormalities.  At her visit with Dr. Okey Dupre, she denied a history of heart problems and was without symptoms of angina.  She did note occasional shortness of breath but attributed this mostly to rhinorrhea and sinus congestion that she attributed to aromatase inhibitors.  She was without difficulties walking on a treadmill with her husband.  She did note some increase in ankle swelling over the past several months.  To further evaluate her cardiomyopathy, she underwent echo on 12/28/2022 showed an EF 50 to 55%, no regional wall motion abnormalities, grade 1 diastolic dysfunction, normal RV systolic function and ventricular cavity size, mild mitral regurgitation, and an estimated right atrial pressure of 3 mmHg.  She was last seen in the  office in 12/2022 and was without symptoms of angina or cardiac decompensation.  She was working on lifestyle modification and wearing compression socks.  She reported some exertional dyspnea when walking up an incline if outdoors.  She was asymptomatic when walking up an incline indoors.  Further testing was deferred at that time.  She comes in doing well from a cardiac perspective and is without symptoms of angina or cardiac decompensation.  No progressive dyspnea, orthopnea, PND, early satiety.  No dizziness, presyncope, or syncope.  She also notes that since initiating Allegra her shortness of breath when ambulating up an incline outdoors is also improved.  She does continue to note intermittent mild bilateral ankle swelling that is at times more progressive throughout the day.  She does elevate her legs and wear compression socks.  She does watch her sodium intake.  Weight stable.  She plans to discuss letrozole with her oncologist at her next visit.   Labs independently reviewed: 01/2023 - TC 204, TG 109, HDL 63, LDL 118, Hgb 12.8, PLT 236, potassium 4.1, BUN 10, serum creatinine 0.7, albumin 4.2, AST/ALT normal, TSH normal, A1c 5.8  Past Medical History:  Diagnosis Date   Asthma    Breast cancer ([facility]) 2005   Cancer ([facility])    Breast- Right-2005; bilateral-2021   Cardiomyopathy ([facility]) 07/14/2020   LVEF 43.7% by cardiac MRI; LVEF 40-45% with goal hypokinesis on repeat cardiac MRI 09/04/2022 (research protocol through [facility])   Hyperlipidemia    Diet controlled   Osteopenia  Personal history of chemotherapy    Personal history of radiation therapy    PONV (postoperative nausea and vomiting)     Past Surgical History:  Procedure Laterality Date   BREAST BIOPSY Right 2005   +   BREAST BIOPSY Right 03/01/2020   [place] bx of mass at 2:00 heart marker,  INVASIVE MAMMARY CARCINOMA, NO   BREAST BIOPSY Left 03/01/2020   [place] bx mass at 3:00, coil marker,  INVASIVE MAMMARY CARCINOMA   BREAST BIOPSY  Right 03/01/2020   [place] bx of right axilla, no marker placed,BENIGN LYMPH NODE.   BREAST LUMPECTOMY Right 2005   BREAST LUMPECTOMY WITH NEEDLE LOCALIZATION AND AXILLARY SENTINEL LYMPH NODE BX Right 2005   BREAST LUMPECTOMY WITH RADIOACTIVE SEED AND SENTINEL LYMPH NODE BIOPSY Bilateral 04/14/2020   Procedure: BILATERAL RADIOACTIVE SEED GUIDED BREAST LUMPECTOMIES, BILATERAL AXILLARY SENTINEL LYMPH NODE BIOPSIES, WITH BLUE DYE INJECTION RIGHT BREAST;  Surgeon: Ovidio Kin, MD;  Location: [facility] OR;  Service: General;  Laterality: Bilateral;   BREAST SURGERY     COLONOSCOPY WITH PROPOFOL N/A 04/02/2017   Procedure: COLONOSCOPY WITH PROPOFOL;  Surgeon: Wyline Mood, MD;  Location: [facility] ENDOSCOPY;  Service: Endoscopy;  Laterality: N/A;   COLONOSCOPY WITH PROPOFOL N/A 08/29/2022   Procedure: COLONOSCOPY WITH PROPOFOL;  Surgeon: Toledo, Boykin Nearing, MD;  Location: [facility] ENDOSCOPY;  Service: Gastroenterology;  Laterality: N/A;   ESOPHAGOGASTRODUODENOSCOPY (EGD) WITH PROPOFOL N/A 04/02/2017   Procedure: ESOPHAGOGASTRODUODENOSCOPY (EGD) WITH PROPOFOL;  Surgeon: Wyline Mood, MD;  Location: [facility] ENDOSCOPY;  Service: Endoscopy;  Laterality: N/A;   TONSILLECTOMY  ~2000    Current Medications: Current Meds  Medication Sig   acetaminophen (TYLENOL) 500 MG tablet Take 500-1,000 mg by mouth 2 (two) times daily as needed (pain.).   Calcium Carbonate (CALCIUM 500 PO) Take 1 tablet by mouth 2 (two) times daily.   carvedilol (COREG) 3.125 MG tablet Take 1 tablet (3.125 mg total) by mouth 2 (two) times daily.   cetirizine (ZYRTEC) 10 MG tablet Take 10 mg by mouth daily as needed.   famotidine (PEPCID) 20 MG tablet Take 20 mg by mouth daily as needed for heartburn or indigestion.   furosemide (LASIX) 20 MG tablet Take 1 tablet (20 mg total) by mouth daily as needed for weight gain of 2 - 3 lbs overnight.   letrozole (FEMARA) 2.5 MG tablet Take 1 tablet (2.5 mg total) by mouth daily.   melatonin 5 MG TABS 5 mg at bedtime as  needed.   naproxen sodium (ALEVE) 220 MG tablet Take 220-440 mg by mouth daily as needed (pain.).   nitrofurantoin, macrocrystal-monohydrate, (MACROBID) 100 MG capsule 1 capsule twice daily x 5-7 days at onset of UTI symptoms and positive urine dipstick   Vitamin D, Cholecalciferol, 25 MCG (1000 UT) CAPS Take 1 capsule by mouth daily.    Allergies:   Celebrex [celecoxib], Clindamycin hcl, Loratadine, Omeprazole, Penicillins, and Sulfa antibiotics   Social History   Socioeconomic History   Marital status: Married    Spouse name: Not on file   Number of children: Not on file   Years of education: Not on file   Highest education level: Not on file  Occupational History   Not on file  Tobacco Use   Smoking status: Never   Smokeless tobacco: Never  Vaping Use   Vaping Use: Never used  Substance and Sexual Activity   Alcohol use: No   Drug use: No   Sexual activity: Not Currently  Other Topics Concern   Not on file  Social History Narrative   Not on file   Social Determinants of Health   Financial Resource Strain: Not on file  Food Insecurity: Not on file  Transportation Needs: Not on file  Physical Activity: Not on file  Stress: Not on file  Social Connections: Not on file     Family History:  The patient's family history includes Atrial fibrillation in her mother; Breast cancer (age of onset: 10) in her mother; Cancer (age of onset: 34) in her mother; Heart failure in her father; Hepatitis in her father; Hypertension in her father, mother, and sister; Leukemia in her mother.  ROS:   12-point review of systems is negative unless otherwise noted in the HPI.   EKGs/Labs/Other Studies Reviewed:    Studies reviewed were summarized above. The additional studies were reviewed today:  2D echo 12/28/2022: 1. Left ventricular ejection fraction, by estimation, is 50 to 55%. The  left ventricle has low normal function. The left ventricle has no regional  wall motion  abnormalities. Left ventricular diastolic parameters are  consistent with Grade I diastolic  dysfunction (impaired relaxation).   2. Right ventricular systolic function is normal. The right ventricular  size is normal. Tricuspid regurgitation signal is inadequate for assessing  PA pressure.   3. The mitral valve is normal in structure. Mild mitral valve  regurgitation. No evidence of mitral stenosis.   4. The aortic valve is tricuspid. Aortic valve regurgitation is not  visualized. No aortic stenosis is present.   5. The inferior vena cava is normal in size with greater than 50%  respiratory variability, suggesting right atrial pressure of 3 mmHg.    EKG:  EKG is ordered today.  The EKG ordered today demonstrates NSR, 81 bpm, no acute ST-T changes  Recent Labs: No results found for requested labs within last 365 days.  Recent Lipid Panel No results found for: "CHOL", "TRIG", "HDL", "CHOLHDL", "VLDL", "LDLCALC", "LDLDIRECT"  PHYSICAL EXAM:    VS:  BP (!) 128/92 (BP Location: Left Arm, Patient Position: Sitting, Cuff Size: Large)   Pulse 81   Ht 5\' 6"  (1.676 m)   Wt 205 lb 12.8 oz (93.4 kg)   LMP  (LMP Unknown)   SpO2 96%   BMI 33.22 kg/m   BMI: Body mass index is 33.22 kg/m.  Physical Exam Vitals reviewed.  Constitutional:      Appearance: She is well-developed.  HENT:     Head: Normocephalic and atraumatic.  Eyes:     General:        Right eye: No discharge.        Left eye: No discharge.  Neck:     Vascular: No JVD.  Cardiovascular:     Rate and Rhythm: Normal rate and regular rhythm.     Pulses:          Posterior tibial pulses are 2+ on the right side and 2+ on the left side.     Heart sounds: Normal heart sounds, S1 normal and S2 normal. Heart sounds not distant. No midsystolic click and no opening snap. No murmur heard.    No friction rub.  Pulmonary:     Effort: Pulmonary effort is normal. No respiratory distress.     Breath sounds: Normal breath sounds. No  decreased breath sounds, wheezing or rales.  Chest:     Chest wall: No tenderness.  Abdominal:     General: There is no distension.  Musculoskeletal:     Cervical back: Normal range of motion.  Right lower leg: Edema present.     Left lower leg: Edema present.     Comments: Trivial to mild lower extremity ankle nonpitting edema with the left being worse than the right.  Skin:    General: Skin is warm and dry.     Nails: There is no clubbing.  Neurological:     Mental Status: She is alert and oriented to person, place, and time.  Psychiatric:        Speech: Speech normal.        Behavior: Behavior normal.        Thought Content: Thought content normal.        Judgment: Judgment normal.     Wt Readings from Last 3 Encounters:  05/27/23 205 lb 12.8 oz (93.4 kg)  01/17/23 203 lb (92.1 kg)  01/01/23 205 lb (93 kg)     ASSESSMENT & PLAN:   HFimpEF: Euvolemic and well compensated. Recent echo showed low normal LV systolic function. She remains on carvedilol.  No symptoms of ischemic heart disease or cardiac decompensation.  Suspect her lower extremity swelling is more dependent in etiology and exacerbated by letrozole.  Trial of furosemide 20 mg as needed weight gain greater than 2 to 3 pounds overnight with recommendation to take very sparingly.  Continue leg elevation and compression socks.    HLD: LDL 118 in 01/2023.  Continued heart healthy diet encouraged.  Followed by PCP.  Recurrent breast cancer: Followed by oncology.  She plans to discuss letorzole with her oncologist.    Disposition: F/u with Dr. Okey Dupre or an APP in 6 months.   Medication Adjustments/Labs and Tests Ordered: Current medicines are reviewed at length with the patient today.  Concerns regarding medicines are outlined above. Medication changes, Labs and Tests ordered today are summarized above and listed in the Patient Instructions accessible in Encounters.   Signed, Eula Listen, PA-C 05/27/2023 1:06 PM      [facility] [place] [address] [place], [place] [address] [phone]

## 2023-05-27 ENCOUNTER — Ambulatory Visit: Payer: Commercial Managed Care - PPO | Attending: Physician Assistant | Admitting: Physician Assistant

## 2023-05-27 ENCOUNTER — Other Ambulatory Visit: Payer: Self-pay

## 2023-05-27 ENCOUNTER — Encounter: Payer: Self-pay | Admitting: Physician Assistant

## 2023-05-27 VITALS — BP 128/92 | HR 81 | Ht 66.0 in | Wt 205.8 lb

## 2023-05-27 DIAGNOSIS — M7989 Other specified soft tissue disorders: Secondary | ICD-10-CM

## 2023-05-27 DIAGNOSIS — E785 Hyperlipidemia, unspecified: Secondary | ICD-10-CM | POA: Diagnosis not present

## 2023-05-27 DIAGNOSIS — C50911 Malignant neoplasm of unspecified site of right female breast: Secondary | ICD-10-CM | POA: Diagnosis not present

## 2023-05-27 DIAGNOSIS — C50912 Malignant neoplasm of unspecified site of left female breast: Secondary | ICD-10-CM

## 2023-05-27 DIAGNOSIS — I5032 Chronic diastolic (congestive) heart failure: Secondary | ICD-10-CM

## 2023-05-27 MED ORDER — FUROSEMIDE 20 MG PO TABS
ORAL_TABLET | ORAL | 3 refills | Status: DC
  Filled 2023-05-27: qty 90, 90d supply, fill #0

## 2023-05-27 NOTE — Patient Instructions (Signed)
Medication Instructions:  Your physician has recommended you make the following change in your medication:   START - furosemide (LASIX) 20 MG tablet - Take 1 tablet (20 mg total) by mouth daily as needed for weight gain of 2 - 3 lbs overnight  *If you need a refill on your cardiac medications before your next appointment, please call your pharmacy*  Lab Work: -None ordered  Testing/Procedures: -None ordered  Follow-Up: At [facility], you and your health needs are our priority.  As part of our continuing mission to provide you with exceptional heart care, we have created designated Provider Care Teams.  These Care Teams include your primary Cardiologist (physician) and Advanced Practice Providers (APPs -  Physician Assistants and Nurse Practitioners) who all work together to provide you with the care you need, when you need it.  Your next appointment:   6 month(s)  Provider:   Eula Listen, PA-C    Other Instructions -None

## 2023-06-26 ENCOUNTER — Inpatient Hospital Stay: Payer: Commercial Managed Care - PPO | Attending: Oncology | Admitting: Oncology

## 2023-06-26 ENCOUNTER — Encounter: Payer: Self-pay | Admitting: Oncology

## 2023-06-26 ENCOUNTER — Encounter: Payer: Self-pay | Admitting: Internal Medicine

## 2023-06-26 VITALS — BP 113/81 | HR 98 | Temp 98.1°F | Resp 18 | Ht 66.0 in | Wt 206.5 lb

## 2023-06-26 DIAGNOSIS — Z9221 Personal history of antineoplastic chemotherapy: Secondary | ICD-10-CM | POA: Insufficient documentation

## 2023-06-26 DIAGNOSIS — Z853 Personal history of malignant neoplasm of breast: Secondary | ICD-10-CM

## 2023-06-26 DIAGNOSIS — C50912 Malignant neoplasm of unspecified site of left female breast: Secondary | ICD-10-CM | POA: Insufficient documentation

## 2023-06-26 DIAGNOSIS — Z79811 Long term (current) use of aromatase inhibitors: Secondary | ICD-10-CM | POA: Insufficient documentation

## 2023-06-26 DIAGNOSIS — Z08 Encounter for follow-up examination after completed treatment for malignant neoplasm: Secondary | ICD-10-CM | POA: Diagnosis not present

## 2023-06-26 DIAGNOSIS — Z170 Estrogen receptor positive status [ER+]: Secondary | ICD-10-CM | POA: Diagnosis not present

## 2023-06-26 DIAGNOSIS — C50911 Malignant neoplasm of unspecified site of right female breast: Secondary | ICD-10-CM | POA: Diagnosis not present

## 2023-06-26 DIAGNOSIS — Z923 Personal history of irradiation: Secondary | ICD-10-CM | POA: Insufficient documentation

## 2023-06-26 NOTE — Progress Notes (Signed)
Hematology/Oncology Consult note [facility]  [phone] [phone]  Patient Care Team: Marguarite Arbour, MD as PCP - General (Internal Medicine) End, Cristal Deer, MD as PCP - Cardiology (Cardiology) Scarlett Presto, RN (Inactive) as Oncology Nurse Navigator Carmina Miller, MD as Radiation Oncologist (Radiation Oncology) Chriss Driver, RN as Registered Nurse Creig Hines, MD as Consulting Physician (Oncology)   Name of the patient: Marissa Phillips  [id number]  [birth year]   Date of visit: 06/26/23  Diagnosis-history of bilateral breast cancer  Chief complaint/ Reason for visit-routine follow-up of breast cancer on letrozole  Heme/Onc history: Patient is a 62 year old female with history ofRight breast cancer in 2005 ER positive s/p lumpectomy and adjuvant radiation therapy and 5 years of endocrine therapy.  She was found to have bilateral breast masses based on her mammogram and 2018. Patient went for bilateral lumpectomy.  Right breast pathology showed 0.6 cm invasive ductal carcinoma grade 2 with negative margins and left breast showed 1.2 cm invasive lobular carcinoma.  1 sentinel lymph node on the left side was negative for malignancy.  Both these tumors were ER positive and HER-2 negative.  PR negative.   Oncotype testing was sent on both right and left breast specimens and both came back with an Oncotype score of 24 which puts her at intermediate risk for age.  She would not benefit from adjuvant chemotherapy.  She completed adjuvant radiation therapy and started Aromasin in September 2021 patient had problems tolerating Aromasin and was switched to letrozole in January 2023   Patient was enrolled in research trial in the upbeat study for which she had to get cardiac MRI done which incidentally showed a low EF of 40 to 45%.  Interval history-she follows up with cardiology clinic for incidentally found low EF on MRI.  She also has  some bilateral lower extremity edema which could be potentially due to letrozole.  She is otherwise tolerating letrozole well without any significant side effects.  She has occasional pain at the lumpectomy site  ECOG PS- 1 Pain scale- 0   Review of systems- Review of Systems  Constitutional:  Negative for chills, fever, malaise/fatigue and weight loss.  HENT:  Negative for congestion, ear discharge and nosebleeds.   Eyes:  Negative for blurred vision.  Respiratory:  Negative for cough, hemoptysis, sputum production, shortness of breath and wheezing.   Cardiovascular:  Positive for leg swelling. Negative for chest pain, palpitations, orthopnea and claudication.  Gastrointestinal:  Negative for abdominal pain, blood in stool, constipation, diarrhea, heartburn, melena, nausea and vomiting.  Genitourinary:  Negative for dysuria, flank pain, frequency, hematuria and urgency.  Musculoskeletal:  Negative for back pain, joint pain and myalgias.  Skin:  Negative for rash.  Neurological:  Negative for dizziness, tingling, focal weakness, seizures, weakness and headaches.  Endo/Heme/Allergies:  Does not bruise/bleed easily.  Psychiatric/Behavioral:  Negative for depression and suicidal ideas. The patient does not have insomnia.       Allergies  Allergen Reactions   Celebrex [Celecoxib] Rash   Clindamycin Hcl Rash   Loratadine Other (See Comments)    fevers   Omeprazole Other (See Comments)    After 1 week of therapy dry mouth and mild nighttime urinary frequency -bothersome enough for her stop it.    Penicillins Rash    Slight redness and itching    Sulfa Antibiotics Rash     Past Medical History:  Diagnosis Date   Asthma    Breast cancer (HCC)  2005   Cancer [facility])    Breast- Right-2005; bilateral-2021   Cardiomyopathy (HCC) 07/14/2020   LVEF 43.7% by cardiac MRI; LVEF 40-45% with goal hypokinesis on repeat cardiac MRI 09/04/2022 (research protocol through [facility])   Hyperlipidemia     Diet controlled   Osteopenia    Personal history of chemotherapy    Personal history of radiation therapy    PONV (postoperative nausea and vomiting)      Past Surgical History:  Procedure Laterality Date   BREAST BIOPSY Right 2005   +   BREAST BIOPSY Right 03/01/2020   [place] bx of mass at 2:00 heart marker,  INVASIVE MAMMARY CARCINOMA, NO   BREAST BIOPSY Left 03/01/2020   [place] bx mass at 3:00, coil marker,  INVASIVE MAMMARY CARCINOMA   BREAST BIOPSY Right 03/01/2020   [place] bx of right axilla, no marker placed,BENIGN LYMPH NODE.   BREAST LUMPECTOMY Right 2005   BREAST LUMPECTOMY WITH NEEDLE LOCALIZATION AND AXILLARY SENTINEL LYMPH NODE BX Right 2005   BREAST LUMPECTOMY WITH RADIOACTIVE SEED AND SENTINEL LYMPH NODE BIOPSY Bilateral 04/14/2020   Procedure: BILATERAL RADIOACTIVE SEED GUIDED BREAST LUMPECTOMIES, BILATERAL AXILLARY SENTINEL LYMPH NODE BIOPSIES, WITH BLUE DYE INJECTION RIGHT BREAST;  Surgeon: Ovidio Kin, MD;  Location: [facility] OR;  Service: General;  Laterality: Bilateral;   BREAST SURGERY     COLONOSCOPY WITH PROPOFOL N/A 04/02/2017   Procedure: COLONOSCOPY WITH PROPOFOL;  Surgeon: Wyline Mood, MD;  Location: [facility] ENDOSCOPY;  Service: Endoscopy;  Laterality: N/A;   COLONOSCOPY WITH PROPOFOL N/A 08/29/2022   Procedure: COLONOSCOPY WITH PROPOFOL;  Surgeon: Toledo, Boykin Nearing, MD;  Location: [facility] ENDOSCOPY;  Service: Gastroenterology;  Laterality: N/A;   ESOPHAGOGASTRODUODENOSCOPY (EGD) WITH PROPOFOL N/A 04/02/2017   Procedure: ESOPHAGOGASTRODUODENOSCOPY (EGD) WITH PROPOFOL;  Surgeon: Wyline Mood, MD;  Location: [facility] ENDOSCOPY;  Service: Endoscopy;  Laterality: N/A;   TONSILLECTOMY  ~2000    Social History   Socioeconomic History   Marital status: Married    Spouse name: Not on file   Number of children: Not on file   Years of education: Not on file   Highest education level: Not on file  Occupational History   Not on file  Tobacco Use   Smoking status: Never   Smokeless  tobacco: Never  Vaping Use   Vaping status: Never Used  Substance and Sexual Activity   Alcohol use: No   Drug use: No   Sexual activity: Not Currently  Other Topics Concern   Not on file  Social History Narrative   Not on file   Social Determinants of Health   Financial Resource Strain: Not on file  Food Insecurity: Not on file  Transportation Needs: Not on file  Physical Activity: Not on file  Stress: Not on file  Social Connections: Not on file  Intimate Partner Violence: Not on file    Family History  Problem Relation Age of Onset   Atrial fibrillation Mother    Hypertension Mother    Cancer Mother 48       Breast, now bilateral   Breast cancer Mother 16       right breast ca then left breast ca    Leukemia Mother    Hypertension Father    Hepatitis Father    Heart failure Father    Hypertension Sister      Current Outpatient Medications:    acetaminophen (TYLENOL) 500 MG tablet, Take 500-1,000 mg by mouth 2 (two) times daily as needed (pain.)., Disp: , Rfl:  calcium carbonate (OSCAL) 1500 (600 Ca) MG TABS tablet, Take by mouth 2 (two) times daily with a meal., Disp: , Rfl:    carvedilol (COREG) 3.125 MG tablet, Take 1 tablet (3.125 mg total) by mouth 2 (two) times daily., Disp: 180 tablet, Rfl: 3   cetirizine (ZYRTEC) 10 MG tablet, Take 10 mg by mouth daily as needed., Disp: , Rfl:    famotidine (PEPCID) 20 MG tablet, Take 20 mg by mouth daily as needed for heartburn or indigestion., Disp: , Rfl:    furosemide (LASIX) 20 MG tablet, Take 1 tablet (20 mg total) by mouth daily as needed for weight gain of 2 - 3 lbs overnight., Disp: 90 tablet, Rfl: 3   letrozole (FEMARA) 2.5 MG tablet, Take 1 tablet (2.5 mg total) by mouth daily., Disp: 90 tablet, Rfl: 1   melatonin 5 MG TABS, 5 mg at bedtime as needed., Disp: , Rfl:    naproxen sodium (ALEVE) 220 MG tablet, Take 220-440 mg by mouth daily as needed (pain.)., Disp: , Rfl:    nitrofurantoin,  macrocrystal-monohydrate, (MACROBID) 100 MG capsule, 1 capsule twice daily x 5-7 days at onset of UTI symptoms and positive urine dipstick, Disp: 30 capsule, Rfl: 1  Physical exam:  Vitals:   06/26/23 1117  BP: 113/81  Pulse: 98  Resp: 18  Temp: 98.1 F (36.7 C)  TempSrc: Tympanic  SpO2: 98%  Weight: 206 lb 8 oz (93.7 kg)  Height: 5\' 6"  (1.676 m)   Physical Exam Cardiovascular:     Rate and Rhythm: Normal rate and regular rhythm.     Heart sounds: Normal heart sounds.  Pulmonary:     Effort: Pulmonary effort is normal.     Breath sounds: Normal breath sounds.  Abdominal:     General: Bowel sounds are normal.     Palpations: Abdomen is soft.  Musculoskeletal:     Comments: Trace bilateral edema  Skin:    General: Skin is warm and dry.  Neurological:     Mental Status: She is alert and oriented to person, place, and time.   Breast exam: Patient is s/p bilateral lumpectomy with a well-healed surgical scar.  No evidence of palpable bilateral axillary adenopathy.  No palpable breast masses.     Latest Ref Rng & Units 12/25/2021   10:41 AM  CMP  Glucose 70 - 99 mg/dL 244   BUN 6 - 20 mg/dL 12   Creatinine 0.10 - 1.00 mg/dL 2.72   Sodium 536 - 644 mmol/L 137   Potassium 3.5 - 5.1 mmol/L 3.9   Chloride 98 - 111 mmol/L 102   CO2 22 - 32 mmol/L 28   Calcium 8.9 - 10.3 mg/dL 9.6   Total Protein 6.5 - 8.1 g/dL 7.8   Total Bilirubin 0.3 - 1.2 mg/dL 0.5   Alkaline Phos 38 - 126 U/L 96   AST 15 - 41 U/L 28   ALT 0 - 44 U/L 29       Latest Ref Rng & Units 12/25/2021   10:41 AM  CBC  WBC 4.0 - 10.5 K/uL 5.5   Hemoglobin 12.0 - 15.0 g/dL 03.4   Hematocrit 74.2 - 46.0 % 43.2   Platelets 150 - 400 K/uL 239      Assessment and plan- Patient is a 62 y.o. female with history of ER positive bilateral breast cancer currently on letrozole and this is a routine follow-up visit  Clinically patient is doing Well with no concerning signs and symptoms of  recurrence based on today's  exam.  Her mammogram from March 2024 was unremarkable.  We discussed switching letrozole to alternative AI but all antiestrogen therapies have a potential side effect of leg swelling.  Patient is agreeable to continuing letrozole at this time.  I will see her back in 6 months no labs   Visit Diagnosis 1. Encounter for follow-up surveillance of breast cancer   2. Use of letrozole (Femara)      Dr. Owens Shark, MD, MPH [facility] [phone] 06/26/2023 3:40 PM

## 2023-07-04 ENCOUNTER — Telehealth: Payer: Self-pay | Admitting: Medical Oncology

## 2023-07-04 NOTE — Telephone Encounter (Signed)
WF [id number] Understanding and Predicting Breast Cancer Events after Treatment (UPBEAT)   Outgoing call: 1445 Year-3 Follow Up study call  LVMOM with patient regarding study follow-up. Patient thanked for completing her study questionnaires online for this follow-up time period. Asked patient to return call at her earliest convenience for a couple of questions regarding any cardiac events in the past year.  Patient thanked, call back number provided.   Rexene Edison, RN, BSN, [facility] Clinical Research Nurse Lead 07/04/2023 2:53 PM

## 2023-07-08 ENCOUNTER — Telehealth: Payer: Self-pay | Admitting: Medical Oncology

## 2023-07-08 NOTE — Telephone Encounter (Signed)
WF [id number] Understanding and Predicting Breast Cancer Events after Treatment (UPBEAT)   Email received from patient on Friday, August 12th, replying to my phone call regarding Year 3 follow-up for study.   Patient reports a diagnosis of "grade 1 CHF, with preserved EF" and is on a low dose beta blocker. These notes are also confirmed to be in patient's medical record. Patient with no other reports of cardiovascular events.   Patient thanked for completing year three questionnaires and for her continuous support of study.  Patient encouraged to call with questions/concerns.    Rexene Edison, RN, BSN, [facility] Clinical Research Nurse Lead 07/08/2023 10:46 AM

## 2023-07-15 ENCOUNTER — Other Ambulatory Visit: Payer: Self-pay

## 2023-07-16 ENCOUNTER — Other Ambulatory Visit: Payer: Self-pay

## 2023-07-17 ENCOUNTER — Other Ambulatory Visit: Payer: Self-pay

## 2023-07-18 ENCOUNTER — Other Ambulatory Visit: Payer: Self-pay

## 2023-07-24 ENCOUNTER — Other Ambulatory Visit: Payer: Self-pay

## 2023-09-27 DIAGNOSIS — I890 Lymphedema, not elsewhere classified: Secondary | ICD-10-CM | POA: Diagnosis not present

## 2023-09-27 DIAGNOSIS — Z87440 Personal history of urinary (tract) infections: Secondary | ICD-10-CM | POA: Diagnosis not present

## 2023-09-27 DIAGNOSIS — C50011 Malignant neoplasm of nipple and areola, right female breast: Secondary | ICD-10-CM | POA: Diagnosis not present

## 2023-09-27 DIAGNOSIS — C50012 Malignant neoplasm of nipple and areola, left female breast: Secondary | ICD-10-CM | POA: Diagnosis not present

## 2023-09-27 DIAGNOSIS — Z170 Estrogen receptor positive status [ER+]: Secondary | ICD-10-CM | POA: Diagnosis not present

## 2023-09-27 DIAGNOSIS — I5022 Chronic systolic (congestive) heart failure: Secondary | ICD-10-CM | POA: Diagnosis not present

## 2023-10-07 ENCOUNTER — Ambulatory Visit
Admission: RE | Admit: 2023-10-07 | Discharge: 2023-10-07 | Disposition: A | Discharge status: Home / Self Care | Payer: Commercial Managed Care - PPO | Source: Ambulatory Visit | Attending: Emergency Medicine | Admitting: Emergency Medicine

## 2023-10-07 ENCOUNTER — Other Ambulatory Visit: Payer: Self-pay

## 2023-10-07 ENCOUNTER — Ambulatory Visit
Admission: EM | Admit: 2023-10-07 | Discharge: 2023-10-07 | Disposition: A | Discharge status: Home / Self Care | Payer: Commercial Managed Care - PPO | Attending: Emergency Medicine | Admitting: Emergency Medicine

## 2023-10-07 DIAGNOSIS — R1032 Left lower quadrant pain: Secondary | ICD-10-CM | POA: Insufficient documentation

## 2023-10-07 DIAGNOSIS — R1030 Lower abdominal pain, unspecified: Secondary | ICD-10-CM | POA: Diagnosis not present

## 2023-10-07 DIAGNOSIS — K6389 Other specified diseases of intestine: Secondary | ICD-10-CM | POA: Insufficient documentation

## 2023-10-07 DIAGNOSIS — R509 Fever, unspecified: Secondary | ICD-10-CM | POA: Diagnosis not present

## 2023-10-07 DIAGNOSIS — R140 Abdominal distension (gaseous): Secondary | ICD-10-CM | POA: Diagnosis not present

## 2023-10-07 DIAGNOSIS — K7689 Other specified diseases of liver: Secondary | ICD-10-CM | POA: Diagnosis not present

## 2023-10-07 LAB — CBC WITH DIFFERENTIAL/PLATELET
Abs Immature Granulocytes: 0.01 10*3/uL (ref 0.00–0.07)
Basophils Absolute: 0 10*3/uL (ref 0.0–0.1)
Basophils Relative: 1 %
Eosinophils Absolute: 0.1 10*3/uL (ref 0.0–0.5)
Eosinophils Relative: 1 %
HCT: 38.4 % (ref 36.0–46.0)
Hemoglobin: 12.9 g/dL (ref 12.0–15.0)
Immature Granulocytes: 0 %
Lymphocytes Relative: 27 %
Lymphs Abs: 1.3 10*3/uL (ref 0.7–4.0)
MCH: 30.4 pg (ref 26.0–34.0)
MCHC: 33.6 g/dL (ref 30.0–36.0)
MCV: 90.6 fL (ref 80.0–100.0)
Monocytes Absolute: 0.6 10*3/uL (ref 0.1–1.0)
Monocytes Relative: 12 %
Neutro Abs: 2.9 10*3/uL (ref 1.7–7.7)
Neutrophils Relative %: 59 %
Platelets: 224 10*3/uL (ref 150–400)
RBC: 4.24 MIL/uL (ref 3.87–5.11)
RDW: 13 % (ref 11.5–15.5)
WBC: 4.9 10*3/uL (ref 4.0–10.5)
nRBC: 0 % (ref 0.0–0.2)

## 2023-10-07 LAB — URINALYSIS, W/ REFLEX TO CULTURE (INFECTION SUSPECTED)
Bilirubin Urine: NEGATIVE
Glucose, UA: NEGATIVE mg/dL
Ketones, ur: NEGATIVE mg/dL
Leukocytes,Ua: NEGATIVE
Nitrite: NEGATIVE
Protein, ur: NEGATIVE mg/dL
Specific Gravity, Urine: 1.02 (ref 1.005–1.030)
pH: 7 (ref 5.0–8.0)

## 2023-10-07 LAB — COMPREHENSIVE METABOLIC PANEL
ALT: 18 U/L (ref 0–44)
AST: 24 U/L (ref 15–41)
Albumin: 4.1 g/dL (ref 3.5–5.0)
Alkaline Phosphatase: 84 U/L (ref 38–126)
Anion gap: 8 (ref 5–15)
BUN: 10 mg/dL (ref 8–23)
CO2: 25 mmol/L (ref 22–32)
Calcium: 9 mg/dL (ref 8.9–10.3)
Chloride: 105 mmol/L (ref 98–111)
Creatinine, Ser: 0.79 mg/dL (ref 0.44–1.00)
GFR, Estimated: >60 mL/min (ref >60)
Glucose, Bld: 121 mg/dL — ABNORMAL HIGH (ref 70–99)
Potassium: 3.8 mmol/L (ref 3.5–5.1)
Sodium: 138 mmol/L (ref 135–145)
Total Bilirubin: 0.5 mg/dL (ref <1.2)
Total Protein: 7.7 g/dL (ref 6.5–8.1)

## 2023-10-07 MED ORDER — IOHEXOL 300 MG/ML  SOLN
100.0000 mL | Freq: Once | INTRAMUSCULAR | Status: AC | PRN
  Administered 2023-10-07: 100 mL via INTRAVENOUS

## 2023-10-07 NOTE — Discharge Instructions (Addendum)
As we discussed, your CT shows that you have epiploic appendagitis, which is an inflammation of the fat surrounding your colon.  Use over-the-counter Aleve according to the package instructions twice daily with food to help with pain and inflammation.  They are unsure what causes epiploic appendagitis but typically resolves on its own.

## 2023-10-07 NOTE — ED Triage Notes (Signed)
Patient presents to [facility] for intermittent lower abdominal pressure, sharp pain, and bloating since Thursday. Fever last night 103.0. States LLQ pain worse yesterday. States she has noted when sitting she has abdominal pressure.

## 2023-10-07 NOTE — ED Provider Notes (Signed)
[facility]    CSN: [id number] Arrival date & time: 10/07/23  0910      History   Chief Complaint Chief Complaint  Patient presents with   Abdominal Pain    HPI Marissa Phillips is a 62 y.o. female.   HPI  62 year old female with a past medical history significant for breast cancer, asthma, hyperlipidemia, and cardiomyopathy presents for evaluation of left lower quadrant abdominal pain.  Her symptoms began with sharp pain and bloating 4 days ago after eating a [place] cheese steak sub with onions and mayonnaise.  Her husband ate the same thing and he had no issues.  The next day she had more cramping, bloating, and gas as well as some epigastric discomfort and burning in her esophagus.  That resolved with Pepcid.  Later that evening she developed some low back pain which resolved with Naprosyn.  She reports that her pain goes across her lower abdomen and is worse with change in position.  Also when she is driving and goes over bumps it tends to be worse.  Last night the patient also became more concerned because she was unable to sleep on her belly, which she normally does, and she had an elevated fever of 100.3.  Additionally, she has been experiencing constipation which she is not typically bothered by.  Past Medical History:  Diagnosis Date   Asthma    Breast cancer (HCC) 2005   Cancer (HCC)    Breast- Right-2005; bilateral-2021   Cardiomyopathy (HCC) 07/14/2020   LVEF 43.7% by cardiac MRI; LVEF 40-45% with goal hypokinesis on repeat cardiac MRI 09/04/2022 (research protocol through [facility])   Hyperlipidemia    Diet controlled   Osteopenia    Personal history of chemotherapy    Personal history of radiation therapy    PONV (postoperative nausea and vomiting)     Patient Active Problem List   Diagnosis Date Noted   Cardiomyopathy (HCC) 11/09/2022   Heart failure with mildly reduced ejection fraction (HFmrEF) (HCC) 11/09/2022   Osteopenia 01/12/2022   Lymphedema of  breast 10/31/2021   Goals of care, counseling/discussion 03/10/2020   Bilateral malignant neoplasm of breast in female (HCC) 03/10/2020   Hx of adenomatous colonic polyps 04/29/2018   Hyperlipidemia, mild 01/02/2016   Anemia 07/06/2014   Asthma 07/06/2014   Environmental allergies 07/06/2014   History of recurrent UTIs 07/06/2014   Screening for cervical cancer 09/25/2011   History of breast cancer 09/25/2011    Past Surgical History:  Procedure Laterality Date   BREAST BIOPSY Right 2005   +   BREAST BIOPSY Right 03/01/2020   [place] bx of mass at 2:00 heart marker,  INVASIVE MAMMARY CARCINOMA, NO   BREAST BIOPSY Left 03/01/2020   [place] bx mass at 3:00, coil marker,  INVASIVE MAMMARY CARCINOMA   BREAST BIOPSY Right 03/01/2020   [place] bx of right axilla, no marker placed,BENIGN LYMPH NODE.   BREAST LUMPECTOMY Right 2005   BREAST LUMPECTOMY WITH NEEDLE LOCALIZATION AND AXILLARY SENTINEL LYMPH NODE BX Right 2005   BREAST LUMPECTOMY WITH RADIOACTIVE SEED AND SENTINEL LYMPH NODE BIOPSY Bilateral 04/14/2020   Procedure: BILATERAL RADIOACTIVE SEED GUIDED BREAST LUMPECTOMIES, BILATERAL AXILLARY SENTINEL LYMPH NODE BIOPSIES, WITH BLUE DYE INJECTION RIGHT BREAST;  Surgeon: Ovidio Kin, MD;  Location: [facility] OR;  Service: General;  Laterality: Bilateral;   BREAST SURGERY     COLONOSCOPY WITH PROPOFOL N/A 04/02/2017   Procedure: COLONOSCOPY WITH PROPOFOL;  Surgeon: Wyline Mood, MD;  Location: [facility] ENDOSCOPY;  Service: Endoscopy;  Laterality: N/A;   COLONOSCOPY WITH PROPOFOL N/A 08/29/2022   Procedure: COLONOSCOPY WITH PROPOFOL;  Surgeon: Toledo, Boykin Nearing, MD;  Location: [facility] ENDOSCOPY;  Service: Gastroenterology;  Laterality: N/A;   ESOPHAGOGASTRODUODENOSCOPY (EGD) WITH PROPOFOL N/A 04/02/2017   Procedure: ESOPHAGOGASTRODUODENOSCOPY (EGD) WITH PROPOFOL;  Surgeon: Wyline Mood, MD;  Location: [facility] ENDOSCOPY;  Service: Endoscopy;  Laterality: N/A;   TONSILLECTOMY  ~2000    OB History   No obstetric history  on file.      Home Medications    Prior to Admission medications   Medication Sig Start Date End Date Taking? Authorizing Provider  acetaminophen (TYLENOL) 500 MG tablet Take 500-1,000 mg by mouth 2 (two) times daily as needed (pain.).    [provider]  calcium carbonate (OSCAL) 1500 (600 Ca) MG TABS tablet Take by mouth 2 (two) times daily with a meal.    [provider]  carvedilol (COREG) 3.125 MG tablet Take 1 tablet (3.125 mg total) by mouth 2 (two) times daily. 11/07/22 11/02/23  End, Cristal Deer, MD  cetirizine (ZYRTEC) 10 MG tablet Take 10 mg by mouth daily as needed.    [provider]  famotidine (PEPCID) 20 MG tablet Take 20 mg by mouth daily as needed for heartburn or indigestion.    [provider]  furosemide (LASIX) 20 MG tablet Take 1 tablet (20 mg total) by mouth daily as needed for weight gain of 2 - 3 lbs overnight. 05/27/23   Sondra Barges, PA-C  letrozole [facility]) 2.5 MG tablet Take 1 tablet (2.5 mg total) by mouth daily. 05/03/23   Creig Hines, MD  melatonin 5 MG TABS 5 mg at bedtime as needed. 06/12/21   [provider]  naproxen sodium (ALEVE) 220 MG tablet Take 220-440 mg by mouth daily as needed (pain.).    [provider]  nitrofurantoin, macrocrystal-monohydrate, (MACROBID) 100 MG capsule Take 1 capsule (100 mg total) by mouth 2 (two) times a day for 5-7 days at onset of UTI symptoms and positive urine dipstick daily. 01/17/23   Riki Altes, MD    Family History Family History  Problem Relation Age of Onset   Atrial fibrillation Mother    Hypertension Mother    Cancer Mother 38       Breast, now bilateral   Breast cancer Mother 81       right breast ca then left breast ca    Leukemia Mother    Hypertension Father    Hepatitis Father    Heart failure Father    Hypertension Sister     Social History Social History   Tobacco Use   Smoking status: Never   Smokeless tobacco: Never  Vaping Use    Vaping status: Never Used  Substance Use Topics   Alcohol use: No   Drug use: No     Allergies   Celebrex [celecoxib], Clindamycin hcl, Loratadine, Omeprazole, Penicillins, and Sulfa antibiotics   Review of Systems Review of Systems  Constitutional:  Positive for fever.  Gastrointestinal:  Positive for abdominal distention, abdominal pain and constipation. Negative for nausea and vomiting.     Physical Exam Triage Vital Signs ED Triage Vitals  Encounter Vitals Group     BP      Systolic BP Percentile      Diastolic BP Percentile      Pulse      Resp      Temp      Temp src      SpO2  Weight      Height      Head Circumference      Peak Flow      Pain Score      Pain Loc      Pain Education      Exclude from Growth Chart    No data found.  Updated Vital Signs BP (!) 126/96 (BP Location: Left Arm)   Pulse 95   Temp 98.6 F (37 C) (Oral)   Resp 18   LMP  (LMP Unknown)   SpO2 97%   Visual Acuity Right Eye Distance:   Left Eye Distance:   Bilateral Distance:    Right Eye Near:   Left Eye Near:    Bilateral Near:     Physical Exam Vitals and nursing note reviewed.  Constitutional:      Appearance: Normal appearance. She is not ill-appearing.  HENT:     Head: Normocephalic and atraumatic.  Cardiovascular:     Rate and Rhythm: Normal rate and regular rhythm.     Pulses: Normal pulses.     Heart sounds: Normal heart sounds. No murmur heard.    No friction rub. No gallop.  Pulmonary:     Effort: Pulmonary effort is normal.     Breath sounds: Normal breath sounds. No wheezing, rhonchi or rales.  Abdominal:     General: Abdomen is flat.     Palpations: Abdomen is soft.     Tenderness: There is abdominal tenderness. There is no guarding or rebound.  Skin:    General: Skin is warm and dry.     Capillary Refill: Capillary refill takes less than 2 seconds.     Findings: No rash.  Neurological:     General: No focal deficit present.     Mental  Status: She is alert and oriented to person, place, and time.      UC Treatments / Results  Labs (all labs ordered are listed, but only abnormal results are displayed) Labs Reviewed  COMPREHENSIVE METABOLIC PANEL - Abnormal; Notable for the following components:      Result Value   Glucose, Bld 121 (*)    All other components within normal limits  URINALYSIS, W/ REFLEX TO CULTURE (INFECTION SUSPECTED) - Abnormal; Notable for the following components:   Hgb urine dipstick TRACE (*)    Bacteria, UA RARE (*)    All other components within normal limits  CBC WITH DIFFERENTIAL/PLATELET    EKG   Radiology CT ABDOMEN PELVIS W CONTRAST  Result Date: 10/07/2023 CLINICAL DATA:  Worsening lower abdominal pain and bloating for several days. Fever. EXAM: CT ABDOMEN AND PELVIS WITH CONTRAST TECHNIQUE: Multidetector CT imaging of the abdomen and pelvis was performed using the standard protocol following bolus administration of intravenous contrast. RADIATION DOSE REDUCTION: This exam was performed according to the departmental dose-optimization program which includes automated exposure control, adjustment of the mA and/or kV according to patient size and/or use of iterative reconstruction technique. CONTRAST:  OMNIPAQUE IOHEXOL 300 MG/ML  SOLN COMPARISON:  01/06/2009 FINDINGS: Lower Chest: No acute findings. Hepatobiliary: No suspicious hepatic masses identified. Stable tiny sub-cm cyst in posterior right hepatic lobe. Gallbladder is unremarkable. No evidence of biliary ductal dilatation. Pancreas:  No mass or inflammatory changes. Spleen: Within normal limits in size and appearance. Adrenals/Urinary Tract: No suspicious masses identified. No evidence of ureteral calculi or hydronephrosis. Stomach/Bowel: No evidence of bowel obstruction. Normal appendix visualized. A well-circumscribed fat attenuation structure with surrounding soft tissue stranding is seen  adjacent to the distal sigmoid colon,  consistent with epiploic appendagitis. No abnormal fluid collections. Vascular/Lymphatic: No pathologically enlarged lymph nodes. No acute vascular findings. Reproductive:  No mass or other significant abnormality. Other:  None. Musculoskeletal:  No suspicious bone lesions identified. IMPRESSION: Epiploic appendagitis adjacent to the distal sigmoid colon. Electronically Signed   By: Danae Orleans M.D.   On: 10/07/2023 17:23    Procedures Procedures (including critical care time)  Medications Ordered in UC Medications - No data to display  Initial Impression / Assessment and Plan / UC Course  I have reviewed the triage vital signs and the nursing notes.  Pertinent labs & imaging results that were available during my care of the patient were reviewed by me and considered in my medical decision making (see chart for details).   Patient is a nontoxic-appearing 62 year old female presenting for evaluation of left lower quad abdominal pain and fever as outlined HPI above.  On exam the patient has a soft abdomen with generalized abdominal tenderness.  She has more focal tenderness in the left lower quadrant.  No guarding or rebound.  She has a history of UTIs and reports that she has been checking a urine dipstick at home which has been negative.  She also denies any urinary symptoms.  She has been experiencing constipation which is uncharacteristic of her.  Her last colonoscopy was in October 2023 which did not show any evidence of diverticulosis or diverticulitis.  She does not have a document history of diverticulitis.  She denies any genitourinary symptoms.  Given her fever and left lower quad abdominal pain I most concerned about diverticulitis versus pelvic abscess.  I initially advised the patient that she need to be evaluated in the ER and she is requesting outpatient imaging be ordered at [facility].  I advised her that we could attempt to get prior authorization for outpatient CT imaging.  I  will order a CBC, CMP, and urinalysis here in clinic to evaluate for systemic infection, urinary tract infection, and also evaluate patient's renal function given that she will need to have contrast for her CT scan.  CBC shows normal white count of 4.9, normal H&H of 12.9 and 30.4, and normal platelets at 224.  No abnormalities to the differential.  CMP shows hyperglycemia with a glucose of 121 otherwise electrolytes, renal function, and transaminases are unremarkable.  Urinalysis shows trace hemoglobin but no leukocyte esterase, nitrates, or protein.  Also no ketones.  Reflex microscopy shows rare bacteria.  I will order an outpatient CT of the abdomen [facility].  Patient's scan will be at 4:00 with an arrival at 2 PM.  Given her normal white count there is less concern for possible diverticulitis or infection but her greater concern for possible developing small bowel obstruction given her bloating and gaseousness.  CT scan of the abdomen pelvis shows epiploic appendagitis adjacent to the distal sigmoid colon.  I did call and speak with the patient after verifying her identity through name and date of birth via telephone and gave her the results of the CT scan.  Plan will be to treat with over-the-counter Aleve twice daily as needed for pain.  I have advised her that this will have to resolve on its own and it may take weeks to months.   Final Clinical Impressions(s) / UC Diagnoses   Final diagnoses:  Left lower quadrant abdominal pain  Epiploic appendagitis     Discharge Instructions  As we discussed, your CT shows that you have epiploic appendagitis, which is an inflammation of the fat surrounding your colon.  Use over-the-counter Aleve according to the package instructions twice daily with food to help with pain and inflammation.  They are unsure what causes epiploic appendagitis but typically resolves on its own.     ED Prescriptions    None    PDMP not reviewed this encounter.   Becky Augusta, NP 10/07/23 [phone]

## 2023-10-25 ENCOUNTER — Encounter: Payer: Self-pay | Admitting: Urology

## 2023-10-26 ENCOUNTER — Other Ambulatory Visit: Payer: Self-pay | Admitting: Internal Medicine

## 2023-10-27 ENCOUNTER — Other Ambulatory Visit: Payer: Self-pay | Admitting: Internal Medicine

## 2023-10-27 ENCOUNTER — Other Ambulatory Visit: Payer: Self-pay

## 2023-10-28 ENCOUNTER — Other Ambulatory Visit: Payer: Self-pay

## 2023-10-28 ENCOUNTER — Other Ambulatory Visit: Payer: Self-pay | Admitting: Urology

## 2023-10-28 MED ORDER — ESTRADIOL 0.1 MG/GM VA CREA
1.0000 | TOPICAL_CREAM | VAGINAL | 1 refills | Status: AC
  Filled 2023-10-28: qty 42.5, 90d supply, fill #0
  Filled 2024-03-17: qty 42.5, 90d supply, fill #1

## 2023-10-28 MED FILL — Carvedilol Tab 3.125 MG: ORAL | 90 days supply | Qty: 180 | Fill #0 | Status: AC

## 2023-10-29 ENCOUNTER — Other Ambulatory Visit: Payer: Self-pay

## 2023-11-04 ENCOUNTER — Other Ambulatory Visit: Payer: Self-pay

## 2023-11-04 ENCOUNTER — Other Ambulatory Visit: Payer: Self-pay | Admitting: Oncology

## 2023-11-04 MED ORDER — LETROZOLE 2.5 MG PO TABS
2.5000 mg | ORAL_TABLET | Freq: Every day | ORAL | 1 refills | Status: DC
  Filled 2023-11-04: qty 90, 90d supply, fill #0

## 2023-11-22 ENCOUNTER — Ambulatory Visit: Payer: Commercial Managed Care - PPO | Admitting: Physician Assistant

## 2023-12-17 ENCOUNTER — Ambulatory Visit: Payer: Commercial Managed Care - PPO | Attending: Physician Assistant | Admitting: Physician Assistant

## 2023-12-17 ENCOUNTER — Encounter: Payer: Self-pay | Admitting: Physician Assistant

## 2023-12-17 VITALS — BP 110/83 | HR 69 | Ht 66.0 in | Wt 201.0 lb

## 2023-12-17 DIAGNOSIS — E785 Hyperlipidemia, unspecified: Secondary | ICD-10-CM | POA: Diagnosis not present

## 2023-12-17 DIAGNOSIS — I5032 Chronic diastolic (congestive) heart failure: Secondary | ICD-10-CM

## 2023-12-17 NOTE — Progress Notes (Signed)
Cardiology Office Note    Date:  12/17/2023   ID:  DORENE Phillips, DOB Sep 27, 1961, MRN [id number]  PCP:  Marguarite Arbour, MD  Cardiologist:  Yvonne Kendall, MD  Electrophysiologist:  None   Chief Complaint: Follow-up  History of Present Illness:   Marissa Phillips is a 63 y.o. female with history of recurrent breast cancer initially treated with lumpectomy and chemoradiation right breast cancer in 2005 with her chemotherapy regimen including Adriamycin with recurrent bilateral breast cancer in 2021 managed with lumpectomies and radiation followed by aromatase inhibitor therapy, cardiomyopathy, HLD, and frequent UTIs who presents for follow-up of HFimpEF.   She was evaluated as a new patient by Dr. Okey Dupre on 11/07/2022 at the request of her PCP for evaluation of abnormal cardiac MRI.  It was noted at that time she had been participating in a research study through [facility] that had included serial cardiac MRIs.  She brought a limited report with her to her initial visit with Dr. Okey Dupre that indicated a suspected LVEF of 40 to 45% on a study in 08/2022.  There was concern that an initial scan in 06/2020 showed an EF of 43.7%.  Repeat a scan in 09/2020 did not make note of any abnormalities.  At her visit with Dr. Okey Dupre, she denied a history of heart problems and was without symptoms of angina.  She did note occasional shortness of breath but attributed this mostly to rhinorrhea and sinus congestion that she attributed to aromatase inhibitors.  She was without difficulties walking on a treadmill with her husband.  She did note some increase in ankle swelling over the past several months.  To further evaluate her cardiomyopathy, she underwent echo on 12/28/2022 showed an EF 50 to 55%, no regional wall motion abnormalities, grade 1 diastolic dysfunction, normal RV systolic function and ventricular cavity size, mild mitral regurgitation, and an estimated right atrial pressure of 3 mmHg.  She was last seen in the  office in 05/2023 and remained without symptoms of angina or cardiac decompensation.  She noted an improvement in her shortness of breath when ambulating up an incline outdoors following the initiation of Allegra.  She comes in doing well from a cardiac perspective and is without symptoms of angina or cardiac decompensation.  No progressive dyspnea, orthopnea, PND, or early satiety.  No dizziness, presyncope, or syncope.  No falls or symptoms concerning for bleeding.  She did note a 2 pound weight gain over Thanksgiving, though has not needed any as needed furosemide.  She is wearing compression socks with stable intermittent lower extremity swelling.  Weight has been stable at home.  Weight is down 4 pounds today when compared to her visit in 05/2023.  Overall, she feels like she is doing well.   Labs independently reviewed: 09/2023 - potassium 3.8, BUN 10, serum creatinine 0.79, albumin 1, AST/ALT normal, Hgb 12.9, PLT 224 01/2023 - TC 204, TG 109, HDL 63, LDL 118, TSH normal, A1c 5.8   Past Medical History:  Diagnosis Date   Asthma    Breast cancer (HCC) 2005   Cancer (HCC)    Breast- Right-2005; bilateral-2021   Cardiomyopathy (HCC) 07/14/2020   LVEF 43.7% by cardiac MRI; LVEF 40-45% with goal hypokinesis on repeat cardiac MRI 09/04/2022 (research protocol through [facility])   Hyperlipidemia    Diet controlled   Osteopenia    Personal history of chemotherapy    Personal history of radiation therapy    PONV (postoperative nausea and vomiting)  Past Surgical History:  Procedure Laterality Date   BREAST BIOPSY Right 2005   +   BREAST BIOPSY Right 03/01/2020   [place] bx of mass at 2:00 heart marker,  INVASIVE MAMMARY CARCINOMA, NO   BREAST BIOPSY Left 03/01/2020   [place] bx mass at 3:00, coil marker,  INVASIVE MAMMARY CARCINOMA   BREAST BIOPSY Right 03/01/2020   [place] bx of right axilla, no marker placed,BENIGN LYMPH NODE.   BREAST LUMPECTOMY Right 2005   BREAST LUMPECTOMY WITH NEEDLE LOCALIZATION  AND AXILLARY SENTINEL LYMPH NODE BX Right 2005   BREAST LUMPECTOMY WITH RADIOACTIVE SEED AND SENTINEL LYMPH NODE BIOPSY Bilateral 04/14/2020   Procedure: BILATERAL RADIOACTIVE SEED GUIDED BREAST LUMPECTOMIES, BILATERAL AXILLARY SENTINEL LYMPH NODE BIOPSIES, WITH BLUE DYE INJECTION RIGHT BREAST;  Surgeon: Ovidio Kin, MD;  Location: [facility] OR;  Service: General;  Laterality: Bilateral;   BREAST SURGERY     COLONOSCOPY WITH PROPOFOL N/A 04/02/2017   Procedure: COLONOSCOPY WITH PROPOFOL;  Surgeon: Wyline Mood, MD;  Location: [facility] ENDOSCOPY;  Service: Endoscopy;  Laterality: N/A;   COLONOSCOPY WITH PROPOFOL N/A 08/29/2022   Procedure: COLONOSCOPY WITH PROPOFOL;  Surgeon: Toledo, Boykin Nearing, MD;  Location: [facility] ENDOSCOPY;  Service: Gastroenterology;  Laterality: N/A;   ESOPHAGOGASTRODUODENOSCOPY (EGD) WITH PROPOFOL N/A 04/02/2017   Procedure: ESOPHAGOGASTRODUODENOSCOPY (EGD) WITH PROPOFOL;  Surgeon: Wyline Mood, MD;  Location: [facility] ENDOSCOPY;  Service: Endoscopy;  Laterality: N/A;   TONSILLECTOMY  ~2000    Current Medications: Current Meds  Medication Sig   acetaminophen (TYLENOL) 500 MG tablet Take 500-1,000 mg by mouth 2 (two) times daily as needed (pain.).   carvedilol (COREG) 3.125 MG tablet Take 1 tablet (3.125 mg total) by mouth 2 (two) times daily.   cetirizine (ZYRTEC) 10 MG tablet Take 10 mg by mouth daily as needed.   estradiol (ESTRACE) 0.1 MG/GM vaginal cream Place 1 Application vaginally 2 (two) times a week. Apply a pea-sized amount to fingertip or Q-tip and wipe in vaginal roof twice weekly.   famotidine (PEPCID) 20 MG tablet Take 20 mg by mouth daily as needed for heartburn or indigestion.   letrozole (FEMARA) 2.5 MG tablet Take 1 tablet (2.5 mg total) by mouth daily.   melatonin 5 MG TABS 5 mg at bedtime as needed.   naproxen sodium (ALEVE) 220 MG tablet Take 220-440 mg by mouth daily as needed (pain.).    Allergies:   Celebrex [celecoxib], Clindamycin hcl, Loratadine, Omeprazole,  Penicillins, and Sulfa antibiotics   Social History   Socioeconomic History   Marital status: Married    Spouse name: Not on file   Number of children: Not on file   Years of education: Not on file   Highest education level: Not on file  Occupational History   Not on file  Tobacco Use   Smoking status: Never   Smokeless tobacco: Never  Vaping Use   Vaping status: Never Used  Substance and Sexual Activity   Alcohol use: No   Drug use: No   Sexual activity: Not Currently  Other Topics Concern   Not on file  Social History Narrative   Not on file   Social Drivers of Health   Financial Resource Strain: Not on file  Food Insecurity: Not on file  Transportation Needs: Not on file  Physical Activity: Not on file  Stress: Not on file  Social Connections: Not on file     Family History:  The patient's family history includes Atrial fibrillation in her mother; Breast cancer (age of onset: 58)  in her mother; Cancer (age of onset: 79) in her mother; Heart failure in her father; Hepatitis in her father; Hypertension in her father, mother, and sister; Leukemia in her mother.  ROS:   12-point review of systems is negative unless otherwise noted in the HPI.   EKGs/Labs/Other Studies Reviewed:    Studies reviewed were summarized above. The additional studies were reviewed today:  2D echo 12/28/2022: 1. Left ventricular ejection fraction, by estimation, is 50 to 55%. The  left ventricle has low normal function. The left ventricle has no regional  wall motion abnormalities. Left ventricular diastolic parameters are  consistent with Grade I diastolic  dysfunction (impaired relaxation).   2. Right ventricular systolic function is normal. The right ventricular  size is normal. Tricuspid regurgitation signal is inadequate for assessing  PA pressure.   3. The mitral valve is normal in structure. Mild mitral valve  regurgitation. No evidence of mitral stenosis.   4. The aortic valve is  tricuspid. Aortic valve regurgitation is not  visualized. No aortic stenosis is present.   5. The inferior vena cava is normal in size with greater than 50%  respiratory variability, suggesting right atrial pressure of 3 mmHg.    EKG:  EKG is ordered today.  The EKG ordered today demonstrates NSR, 67 bpm, no acute ST-T changes  Recent Labs: 10/07/2023: ALT 18; BUN 10; Creatinine, Ser 0.79; Hemoglobin 12.9; Platelets 224; Potassium 3.8; Sodium 138  Recent Lipid Panel No results found for: "CHOL", "TRIG", "HDL", "CHOLHDL", "VLDL", "LDLCALC", "LDLDIRECT"  PHYSICAL EXAM:    VS:  BP 110/83 (BP Location: Left Arm, Patient Position: Sitting, Cuff Size: Normal)   Pulse 69   Ht 5\' 6"  (1.676 m)   Wt 201 lb (91.2 kg)   LMP  (LMP Unknown)   SpO2 95%   BMI 32.44 kg/m   BMI: Body mass index is 32.44 kg/m.  Physical Exam Vitals reviewed.  Constitutional:      Appearance: She is well-developed.  HENT:     Head: Normocephalic and atraumatic.  Eyes:     General:        Right eye: No discharge.        Left eye: No discharge.  Neck:     Vascular: No JVD.  Cardiovascular:     Rate and Rhythm: Normal rate and regular rhythm.     Pulses:          Posterior tibial pulses are 2+ on the right side and 2+ on the left side.     Heart sounds: Normal heart sounds, S1 normal and S2 normal. Heart sounds not distant. No midsystolic click and no opening snap. No murmur heard.    No friction rub.  Pulmonary:     Effort: Pulmonary effort is normal. No respiratory distress.     Breath sounds: Normal breath sounds. No decreased breath sounds, wheezing, rhonchi or rales.  Chest:     Chest wall: No tenderness.  Abdominal:     General: There is no distension.  Musculoskeletal:     Cervical back: Normal range of motion.     Right lower leg: No edema.     Left lower leg: No edema.     Comments: Compression socks noted bilaterally.  Skin:    General: Skin is warm and dry.     Nails: There is no  clubbing.  Neurological:     Mental Status: She is alert and oriented to person, place, and time.  Psychiatric:  Speech: Speech normal.        Behavior: Behavior normal.        Thought Content: Thought content normal.        Judgment: Judgment normal.     Wt Readings from Last 3 Encounters:  12/17/23 201 lb (91.2 kg)  06/26/23 206 lb 8 oz (93.7 kg)  05/27/23 205 lb 12.8 oz (93.4 kg)     ASSESSMENT & PLAN:   HFimpEF: She is euvolemic and well compensated with NYHA class I symptoms.  Most recent echo showed low normal LV systolic function.  She remains on carvedilol 3.125 mg.  She has not needed any as needed furosemide.  No symptoms of ischemic heart disease.  Continue with leg elevation and compression socks for intermittent lower extremity swelling.  No indication for further cardiac testing at this time.  HLD: LDL 118 in 01/2023.  Followed by PCP.     Disposition: F/u with Dr. Okey Dupre or an APP in 12 months, sooner if needed.   Medication Adjustments/Labs and Tests Ordered: Current medicines are reviewed at length with the patient today.  Concerns regarding medicines are outlined above. Medication changes, Labs and Tests ordered today are summarized above and listed in the Patient Instructions accessible in Encounters.   Signed, Eula Listen, PA-C 12/17/2023 5:12 PM     [facility] [place] [address] [place], [place] [address] [phone]

## 2023-12-17 NOTE — Patient Instructions (Signed)
Medication Instructions:  Your Physician recommend you continue on your current medication as directed.    *If you need a refill on your cardiac medications before your next appointment, please call your pharmacy*   Lab Work: None ordered at this time  If you have labs (blood work) drawn today and your tests are completely normal, you will receive your results only by: MyChart Message (if you have MyChart) OR A paper copy in the mail If you have any lab test that is abnormal or we need to change your treatment, we will call you to review the results.   Follow-Up: At [facility], you and your health needs are our priority.  As part of our continuing mission to provide you with exceptional heart care, we have created designated Provider Care Teams.  These Care Teams include your primary Cardiologist (physician) and Advanced Practice Providers (APPs -  Physician Assistants and Nurse Practitioners) who all work together to provide you with the care you need, when you need it.   Your next appointment:   12 month(s)  Provider:   You may see Yvonne Kendall, MD or one of the following Advanced Practice Providers on your designated Care Team:   Eula Listen, [place]

## 2023-12-23 ENCOUNTER — Telehealth: Payer: Self-pay | Admitting: Urology

## 2023-12-23 NOTE — Telephone Encounter (Signed)
Dr.Stoioff gave pt estrogen cream last year, that she has been using.  She has also been taking Macrobid 2 x day for 5 days, which she was told to do if home UTI test was positive.  She said UTI's are getting closer together, and wants to know if she needs to come in sooner than follow up w/Stoioff 2/21?

## 2023-12-24 NOTE — Telephone Encounter (Signed)
Only need to see earlier if symptoms do not resolve with current therapy.  If she does need to be seen early it may be a PA visit

## 2023-12-24 NOTE — Telephone Encounter (Signed)
Patient advised and states she is doing better at this time and will keep an appointment as scheduled.

## 2023-12-27 ENCOUNTER — Inpatient Hospital Stay: Payer: Commercial Managed Care - PPO | Attending: Oncology | Admitting: Oncology

## 2023-12-27 ENCOUNTER — Encounter: Payer: Self-pay | Admitting: Oncology

## 2023-12-27 VITALS — BP 114/84 | HR 90 | Temp 98.4°F | Resp 18 | Wt 205.0 lb

## 2023-12-27 DIAGNOSIS — Z79899 Other long term (current) drug therapy: Secondary | ICD-10-CM | POA: Insufficient documentation

## 2023-12-27 DIAGNOSIS — C50912 Malignant neoplasm of unspecified site of left female breast: Secondary | ICD-10-CM | POA: Insufficient documentation

## 2023-12-27 DIAGNOSIS — C50911 Malignant neoplasm of unspecified site of right female breast: Secondary | ICD-10-CM | POA: Insufficient documentation

## 2023-12-27 DIAGNOSIS — Z170 Estrogen receptor positive status [ER+]: Secondary | ICD-10-CM | POA: Diagnosis not present

## 2023-12-27 DIAGNOSIS — Z853 Personal history of malignant neoplasm of breast: Secondary | ICD-10-CM

## 2023-12-27 DIAGNOSIS — Z9221 Personal history of antineoplastic chemotherapy: Secondary | ICD-10-CM | POA: Insufficient documentation

## 2023-12-27 DIAGNOSIS — Z87440 Personal history of urinary (tract) infections: Secondary | ICD-10-CM | POA: Insufficient documentation

## 2023-12-27 DIAGNOSIS — Z79811 Long term (current) use of aromatase inhibitors: Secondary | ICD-10-CM | POA: Diagnosis not present

## 2023-12-27 DIAGNOSIS — Z923 Personal history of irradiation: Secondary | ICD-10-CM | POA: Diagnosis not present

## 2023-12-27 DIAGNOSIS — Z08 Encounter for follow-up examination after completed treatment for malignant neoplasm: Secondary | ICD-10-CM | POA: Diagnosis not present

## 2023-12-27 DIAGNOSIS — M8580 Other specified disorders of bone density and structure, unspecified site: Secondary | ICD-10-CM | POA: Insufficient documentation

## 2023-12-27 NOTE — Progress Notes (Signed)
Patient here for oncology follow-up appointment, concerns of frequent UTIs

## 2023-12-27 NOTE — Progress Notes (Signed)
Hematology/Oncology Consult note [facility]  [phone] [phone]  Patient Care Team: Marguarite Arbour, MD as PCP - General (Internal Medicine) End, Cristal Deer, MD as PCP - Cardiology (Cardiology) Scarlett Presto, RN (Inactive) as Oncology Nurse Navigator Carmina Miller, MD as Radiation Oncologist (Radiation Oncology) Chriss Driver, RN as Registered Nurse Creig Hines, MD as Consulting Physician (Oncology)   Name of the patient: Marissa Phillips  [id number]  1961/02/19   Date of visit: 12/27/23  Diagnosis-history of bilateral breast cancer  Chief complaint/ Reason for visit-routine follow-up of breast cancer on letrozole  Heme/Onc history: Patient is a 63 year old female with history ofRight breast cancer in 2005 ER positive s/p lumpectomy and adjuvant radiation therapy and 5 years of endocrine therapy.  She was found to have bilateral breast masses based on her mammogram and 2018. Patient went for bilateral lumpectomy.  Right breast pathology showed 0.6 cm invasive ductal carcinoma grade 2 with negative margins and left breast showed 1.2 cm invasive lobular carcinoma.  1 sentinel lymph node on the left side was negative for malignancy.  Both these tumors were ER positive and HER-2 negative.  PR negative.   Oncotype testing was sent on both right and left breast specimens and both came back with an Oncotype score of 24 which puts her at intermediate risk for age.  She would not benefit from adjuvant chemotherapy.  She completed adjuvant radiation therapy and started Aromasin in September 2021 patient had problems tolerating Aromasin and was switched to letrozole in January 2023   Patient was enrolled in research trial in the [employer] study for which she had to get cardiac MRI done which incidentally showed a low EF of 40 to 45%.  Interval history-patient was switched from Aromasin to letrozole because of muscle aches and spasms.  After  starting letrozole patient noticed increasing frequency of UTI despite starting topical vaginal estrogen.  She has now been getting UTIs every few weeks.  She would like to see if stopping letrozole would make a difference.  ECOG PS- 1 Pain scale- 0   Review of systems- Review of Systems  Constitutional:  Negative for chills, fever, malaise/fatigue and weight loss.  HENT:  Negative for congestion, ear discharge and nosebleeds.   Eyes:  Negative for blurred vision.  Respiratory:  Negative for cough, hemoptysis, sputum production, shortness of breath and wheezing.   Cardiovascular:  Negative for chest pain, palpitations, orthopnea and claudication.  Gastrointestinal:  Negative for abdominal pain, blood in stool, constipation, diarrhea, heartburn, melena, nausea and vomiting.  Genitourinary:  Negative for dysuria, flank pain, frequency, hematuria and urgency.       Frequent UTIs  Musculoskeletal:  Negative for back pain, joint pain and myalgias.  Skin:  Negative for rash.  Neurological:  Negative for dizziness, tingling, focal weakness, seizures, weakness and headaches.  Endo/Heme/Allergies:  Does not bruise/bleed easily.  Psychiatric/Behavioral:  Negative for depression and suicidal ideas. The patient does not have insomnia.       Allergies  Allergen Reactions   Celebrex [Celecoxib] Rash   Clindamycin Hcl Rash   Loratadine Other (See Comments)    fevers   Omeprazole Other (See Comments)    After 1 week of therapy dry mouth and mild nighttime urinary frequency -bothersome enough for her stop it.    Penicillins Rash    Slight redness and itching    Sulfa Antibiotics Rash     Past Medical History:  Diagnosis Date   Asthma  Breast cancer (HCC) 2005   Cancer [facility])    Breast- Right-2005; bilateral-2021   Cardiomyopathy (HCC) 07/14/2020   LVEF 43.7% by cardiac MRI; LVEF 40-45% with goal hypokinesis on repeat cardiac MRI 09/04/2022 (research protocol through [place])    Hyperlipidemia    Diet controlled   Osteopenia    Personal history of chemotherapy    Personal history of radiation therapy    PONV (postoperative nausea and vomiting)      Past Surgical History:  Procedure Laterality Date   BREAST BIOPSY Right 2005   +   BREAST BIOPSY Right 03/01/2020   [place] bx of mass at 2:00 heart marker,  INVASIVE MAMMARY CARCINOMA, NO   BREAST BIOPSY Left 03/01/2020   [place] bx mass at 3:00, coil marker,  INVASIVE MAMMARY CARCINOMA   BREAST BIOPSY Right 03/01/2020   [place] bx of right axilla, no marker placed,BENIGN LYMPH NODE.   BREAST LUMPECTOMY Right 2005   BREAST LUMPECTOMY WITH NEEDLE LOCALIZATION AND AXILLARY SENTINEL LYMPH NODE BX Right 2005   BREAST LUMPECTOMY WITH RADIOACTIVE SEED AND SENTINEL LYMPH NODE BIOPSY Bilateral 04/14/2020   Procedure: BILATERAL RADIOACTIVE SEED GUIDED BREAST LUMPECTOMIES, BILATERAL AXILLARY SENTINEL LYMPH NODE BIOPSIES, WITH BLUE DYE INJECTION RIGHT BREAST;  Surgeon: Ovidio Kin, MD;  Location: [facility] OR;  Service: General;  Laterality: Bilateral;   BREAST SURGERY     COLONOSCOPY WITH PROPOFOL N/A 04/02/2017   Procedure: COLONOSCOPY WITH PROPOFOL;  Surgeon: Wyline Mood, MD;  Location: [facility] ENDOSCOPY;  Service: Endoscopy;  Laterality: N/A;   COLONOSCOPY WITH PROPOFOL N/A 08/29/2022   Procedure: COLONOSCOPY WITH PROPOFOL;  Surgeon: Toledo, Boykin Nearing, MD;  Location: [facility] ENDOSCOPY;  Service: Gastroenterology;  Laterality: N/A;   ESOPHAGOGASTRODUODENOSCOPY (EGD) WITH PROPOFOL N/A 04/02/2017   Procedure: ESOPHAGOGASTRODUODENOSCOPY (EGD) WITH PROPOFOL;  Surgeon: Wyline Mood, MD;  Location: [facility] ENDOSCOPY;  Service: Endoscopy;  Laterality: N/A;   TONSILLECTOMY  ~2000    Social History   Socioeconomic History   Marital status: Married    Spouse name: Not on file   Number of children: Not on file   Years of education: Not on file   Highest education level: Not on file  Occupational History   Not on file  Tobacco Use   Smoking status:  Never   Smokeless tobacco: Never  Vaping Use   Vaping status: Never Used  Substance and Sexual Activity   Alcohol use: No   Drug use: No   Sexual activity: Not Currently  Other Topics Concern   Not on file  Social History Narrative   Not on file   Social Drivers of Health   Financial Resource Strain: Not on file  Food Insecurity: Not on file  Transportation Needs: Not on file  Physical Activity: Not on file  Stress: Not on file  Social Connections: Not on file  Intimate Partner Violence: Not on file    Family History  Problem Relation Age of Onset   Atrial fibrillation Mother    Hypertension Mother    Cancer Mother 24       Breast, now bilateral   Breast cancer Mother 62       right breast ca then left breast ca    Leukemia Mother    Hypertension Father    Hepatitis Father    Heart failure Father    Hypertension Sister      Current Outpatient Medications:    acetaminophen (TYLENOL) 500 MG tablet, Take 500-1,000 mg by mouth 2 (two) times daily as needed (pain.)., Disp: ,  Rfl:    carvedilol (COREG) 3.125 MG tablet, Take 1 tablet (3.125 mg total) by mouth 2 (two) times daily., Disp: 180 tablet, Rfl: 0   cetirizine (ZYRTEC) 10 MG tablet, Take 10 mg by mouth daily as needed., Disp: , Rfl:    estradiol (ESTRACE) 0.1 MG/GM vaginal cream, Place 1 Application vaginally 2 (two) times a week. Apply a pea-sized amount to fingertip or Q-tip and wipe in vaginal roof twice weekly., Disp: 42.5 g, Rfl: 1   famotidine (PEPCID) 20 MG tablet, Take 20 mg by mouth daily as needed for heartburn or indigestion., Disp: , Rfl:    letrozole (FEMARA) 2.5 MG tablet, Take 1 tablet (2.5 mg total) by mouth daily., Disp: 90 tablet, Rfl: 1   melatonin 5 MG TABS, 5 mg at bedtime as needed., Disp: , Rfl:    naproxen sodium (ALEVE) 220 MG tablet, Take 220-440 mg by mouth daily as needed (pain.)., Disp: , Rfl:    furosemide (LASIX) 20 MG tablet, Take 1 tablet (20 mg total) by mouth daily as needed for  weight gain of 2 - 3 lbs overnight. (Patient not taking: Reported on 12/27/2023), Disp: 90 tablet, Rfl: 3   nitrofurantoin, macrocrystal-monohydrate, (MACROBID) 100 MG capsule, Take 1 capsule (100 mg total) by mouth 2 (two) times a day for 5-7 days at onset of UTI symptoms and positive urine dipstick daily. (Patient not taking: Reported on 12/27/2023), Disp: 30 capsule, Rfl: 1  Physical exam:  Vitals:   12/27/23 1001  BP: 114/84  Pulse: 90  Resp: 18  Temp: 98.4 F (36.9 C)  TempSrc: Oral  SpO2: 98%  Weight: 205 lb (93 kg)   Physical Exam Cardiovascular:     Rate and Rhythm: Normal rate and regular rhythm.     Heart sounds: Normal heart sounds.  Pulmonary:     Effort: Pulmonary effort is normal.     Breath sounds: Normal breath sounds.  Skin:    General: Skin is warm and dry.  Neurological:     Mental Status: She is alert and oriented to person, place, and time.         Latest Ref Rng & Units 10/07/2023    9:43 AM  CMP  Glucose 70 - 99 mg/dL 409   BUN 8 - 23 mg/dL 10   Creatinine 8.11 - 1.00 mg/dL 9.14   Sodium 782 - 956 mmol/L 138   Potassium 3.5 - 5.1 mmol/L 3.8   Chloride 98 - 111 mmol/L 105   CO2 22 - 32 mmol/L 25   Calcium 8.9 - 10.3 mg/dL 9.0   Total Protein 6.5 - 8.1 g/dL 7.7   Total Bilirubin <2.1 mg/dL 0.5   Alkaline Phos 38 - 126 U/L 84   AST 15 - 41 U/L 24   ALT 0 - 44 U/L 18       Latest Ref Rng & Units 10/07/2023    9:43 AM  CBC  WBC 4.0 - 10.5 K/uL 4.9   Hemoglobin 12.0 - 15.0 g/dL 30.8   Hematocrit 65.7 - 46.0 % 38.4   Platelets 150 - 400 K/uL 224     No images are attached to the encounter.  No results found.   Assessment and plan- Patient is a 63 y.o. female with history of bilateral ER positive breast cancer s/p lumpectomy and adjuvant radiation therapy presently on letrozole.  This is a routine follow-up visit.    Clinically patient is doing well with no signs and symptoms of recurrence based on  today's exam.She would be due for  mammogram in March 2025 which we will schedule.  With regards to frequent UTIs I think it would be reasonable for patient to stop the letrozole for 2 months and see how it goes.  If the UTI frequency goes down we could rechallenge her with aromatase inhibitor and I will consider starting on Arimidex since that is only a she has not taken yet.  If she does not tolerate that well we could consider switching her to tamoxifen.  She will let us know how she is doing in about 2 months time   Visit Diagnosis 1. Encounter for follow-up surveillance of breast cancer   2. Use of letrozole (Femara)      Dr. Owens Shark, MD, MPH [facility] [phone] 12/27/2023 3:52 PM

## 2024-01-17 ENCOUNTER — Ambulatory Visit: Payer: Commercial Managed Care - PPO | Admitting: Urology

## 2024-01-17 ENCOUNTER — Encounter: Payer: Self-pay | Admitting: Urology

## 2024-01-17 VITALS — BP 126/80 | HR 109 | Ht 66.0 in | Wt 203.0 lb

## 2024-01-17 DIAGNOSIS — E785 Hyperlipidemia, unspecified: Secondary | ICD-10-CM | POA: Diagnosis not present

## 2024-01-17 DIAGNOSIS — M8580 Other specified disorders of bone density and structure, unspecified site: Secondary | ICD-10-CM | POA: Diagnosis not present

## 2024-01-17 DIAGNOSIS — Z87442 Personal history of urinary calculi: Secondary | ICD-10-CM | POA: Diagnosis not present

## 2024-01-17 DIAGNOSIS — N390 Urinary tract infection, site not specified: Secondary | ICD-10-CM | POA: Diagnosis not present

## 2024-01-17 DIAGNOSIS — R7309 Other abnormal glucose: Secondary | ICD-10-CM | POA: Diagnosis not present

## 2024-01-17 DIAGNOSIS — I420 Dilated cardiomyopathy: Secondary | ICD-10-CM | POA: Diagnosis not present

## 2024-01-17 DIAGNOSIS — K219 Gastro-esophageal reflux disease without esophagitis: Secondary | ICD-10-CM | POA: Diagnosis not present

## 2024-01-17 DIAGNOSIS — Z79899 Other long term (current) drug therapy: Secondary | ICD-10-CM | POA: Diagnosis not present

## 2024-01-17 DIAGNOSIS — Z09 Encounter for follow-up examination after completed treatment for conditions other than malignant neoplasm: Secondary | ICD-10-CM | POA: Diagnosis not present

## 2024-01-17 DIAGNOSIS — I5022 Chronic systolic (congestive) heart failure: Secondary | ICD-10-CM | POA: Diagnosis not present

## 2024-01-17 DIAGNOSIS — E559 Vitamin D deficiency, unspecified: Secondary | ICD-10-CM | POA: Diagnosis not present

## 2024-01-17 DIAGNOSIS — Z0000 Encounter for general adult medical examination without abnormal findings: Secondary | ICD-10-CM | POA: Diagnosis not present

## 2024-01-17 LAB — URINALYSIS, COMPLETE
Bilirubin, UA: NEGATIVE
Glucose, UA: NEGATIVE
Leukocytes,UA: NEGATIVE
Nitrite, UA: NEGATIVE
Protein,UA: NEGATIVE
Specific Gravity, UA: 1.015 (ref 1.005–1.030)
Urobilinogen, Ur: 0.2 mg/dL (ref 0.2–1.0)
pH, UA: 7 (ref 5.0–7.5)

## 2024-01-17 LAB — MICROSCOPIC EXAMINATION: Bacteria, UA: NONE SEEN

## 2024-01-17 NOTE — Progress Notes (Signed)
I, Maysun Anabel Bene, acting as a scribe for Riki Altes, MD., have documented all relevant documentation on the behalf of Riki Altes, MD, as directed by Riki Altes, MD while in the presence of Riki Altes, MD.  01/17/2024 1:05 PM   Roxana Hires 05/11/1961 [id number]  Referring provider: Marguarite Arbour, MD [address] [facility] [place],  [place] [address]  Chief Complaint  Patient presents with   Recurrent UTI   Urologic history: 1.  Recurrent UTI Macrobid demand therapy Cranberry supplement Low-dose vaginal estrogen  HPI: Marissa Phillips is a 63 y.o. female presents for annual follow-up.    Seen last year for recurrent UTIs. She was taking cranberry supplements. She was having UTIs every 3-4 months and elected demand therapy with Macrobid.  She subsequently elected to start the low dose vaginal estrogen. She estimates she has used the demand therapy 3-4 times in the last 12 months.  No febrile UTIs.    PMH: Past Medical History:  Diagnosis Date   Asthma    Breast cancer (HCC) 2005   Cancer (HCC)    Breast- Right-2005; bilateral-2021   Cardiomyopathy (HCC) 07/14/2020   LVEF 43.7% by cardiac MRI; LVEF 40-45% with goal hypokinesis on repeat cardiac MRI 09/04/2022 (research protocol through [place])   Hyperlipidemia    Diet controlled   Osteopenia    Personal history of chemotherapy    Personal history of radiation therapy    PONV (postoperative nausea and vomiting)     Surgical History: Past Surgical History:  Procedure Laterality Date   BREAST BIOPSY Right 2005   +   BREAST BIOPSY Right 03/01/2020   [place] bx of mass at 2:00 heart marker,  INVASIVE MAMMARY CARCINOMA, NO   BREAST BIOPSY Left 03/01/2020   [place] bx mass at 3:00, coil marker,  INVASIVE MAMMARY CARCINOMA   BREAST BIOPSY Right 03/01/2020   [place] bx of right axilla, no marker placed,BENIGN LYMPH NODE.   BREAST LUMPECTOMY Right 2005   BREAST LUMPECTOMY WITH NEEDLE  LOCALIZATION AND AXILLARY SENTINEL LYMPH NODE BX Right 2005   BREAST LUMPECTOMY WITH RADIOACTIVE SEED AND SENTINEL LYMPH NODE BIOPSY Bilateral 04/14/2020   Procedure: BILATERAL RADIOACTIVE SEED GUIDED BREAST LUMPECTOMIES, BILATERAL AXILLARY SENTINEL LYMPH NODE BIOPSIES, WITH BLUE DYE INJECTION RIGHT BREAST;  Surgeon: Ovidio Kin, MD;  Location: [facility] OR;  Service: General;  Laterality: Bilateral;   BREAST SURGERY     COLONOSCOPY WITH PROPOFOL N/A 04/02/2017   Procedure: COLONOSCOPY WITH PROPOFOL;  Surgeon: Wyline Mood, MD;  Location: [facility] ENDOSCOPY;  Service: Endoscopy;  Laterality: N/A;   COLONOSCOPY WITH PROPOFOL N/A 08/29/2022   Procedure: COLONOSCOPY WITH PROPOFOL;  Surgeon: Toledo, Boykin Nearing, MD;  Location: [facility] ENDOSCOPY;  Service: Gastroenterology;  Laterality: N/A;   ESOPHAGOGASTRODUODENOSCOPY (EGD) WITH PROPOFOL N/A 04/02/2017   Procedure: ESOPHAGOGASTRODUODENOSCOPY (EGD) WITH PROPOFOL;  Surgeon: Wyline Mood, MD;  Location: [facility] ENDOSCOPY;  Service: Endoscopy;  Laterality: N/A;   TONSILLECTOMY  ~2000    Home Medications:  Allergies as of 01/17/2024       Reactions   Celebrex [celecoxib] Rash   Clindamycin Hcl Rash   Loratadine Other (See Comments)   fevers   Omeprazole Other (See Comments)   After 1 week of therapy dry mouth and mild nighttime urinary frequency -bothersome enough for her stop it.    Penicillins Rash   Slight redness and itching   Sulfa Antibiotics Rash        Medication List  Accurate as of January 17, 2024  1:05 PM. If you have any questions, ask your nurse or doctor.          STOP taking these medications    letrozole 2.5 MG tablet Commonly known as: FEMARA   nitrofurantoin (macrocrystal-monohydrate) 100 MG capsule Commonly known as: MACROBID       TAKE these medications    acetaminophen 500 MG tablet Commonly known as: TYLENOL Take 500-1,000 mg by mouth 2 (two) times daily as needed (pain.).   carvedilol 3.125 MG  tablet Commonly known as: COREG Take 1 tablet (3.125 mg total) by mouth 2 (two) times daily.   cetirizine 10 MG tablet Commonly known as: ZYRTEC Take 10 mg by mouth daily as needed.   estradiol 0.1 MG/GM vaginal cream Commonly known as: ESTRACE Place 1 Application vaginally 2 (two) times a week. Apply a pea-sized amount to fingertip or Q-tip and wipe in vaginal roof twice weekly.   famotidine 20 MG tablet Commonly known as: PEPCID Take 20 mg by mouth daily as needed for heartburn or indigestion.   furosemide 20 MG tablet Commonly known as: LASIX Take 1 tablet (20 mg total) by mouth daily as needed for weight gain of 2 - 3 lbs overnight.   melatonin 5 MG Tabs 5 mg at bedtime as needed.   naproxen sodium 220 MG tablet Commonly known as: ALEVE Take 220-440 mg by mouth daily as needed (pain.).        Allergies:  Allergies  Allergen Reactions   Celebrex [Celecoxib] Rash   Clindamycin Hcl Rash   Loratadine Other (See Comments)    fevers   Omeprazole Other (See Comments)    After 1 week of therapy dry mouth and mild nighttime urinary frequency -bothersome enough for her stop it.    Penicillins Rash    Slight redness and itching    Sulfa Antibiotics Rash    Family History: Family History  Problem Relation Age of Onset   Atrial fibrillation Mother    Hypertension Mother    Cancer Mother 32       Breast, now bilateral   Breast cancer Mother 40       right breast ca then left breast ca    Leukemia Mother    Hypertension Father    Hepatitis Father    Heart failure Father    Hypertension Sister     Social History:  reports that she has never smoked. She has never used smokeless tobacco. She reports that she does not drink alcohol and does not use drugs.   Physical Exam: BP 126/80   Pulse (!) 109   Ht 5\' 6"  (1.676 m)   Wt 203 lb (92.1 kg)   LMP  (LMP Unknown)   BMI 32.77 kg/m   Constitutional:  Alert and oriented, No acute distress. HEENT: [place]  AT Respiratory: Normal respiratory effort, no increased work of breathing. Psychiatric: Normal mood and affect.  Urinalysis Dipstick trace ketone/trace blood/microscopy negative.    Assessment & Plan:    1. Recurrent UTIs UA today clear Could continue low dose vaginal estrogen.  She did not need any refills on her Macrobid. She will call for prescription refills and would like to follow up as needed. If she has not been seen within 1 year and needs a refill, we'll schedule an office visit.   I have reviewed the above documentation for accuracy and completeness, and I agree with the above.   Riki Altes, MD  [facility] [address] [place], [place] [address] [phone]

## 2024-01-20 ENCOUNTER — Encounter: Payer: Self-pay | Admitting: Internal Medicine

## 2024-01-20 ENCOUNTER — Other Ambulatory Visit: Payer: Self-pay | Admitting: Internal Medicine

## 2024-01-20 DIAGNOSIS — I420 Dilated cardiomyopathy: Secondary | ICD-10-CM

## 2024-01-20 DIAGNOSIS — Z0000 Encounter for general adult medical examination without abnormal findings: Secondary | ICD-10-CM

## 2024-01-20 DIAGNOSIS — I5022 Chronic systolic (congestive) heart failure: Secondary | ICD-10-CM

## 2024-01-20 DIAGNOSIS — E785 Hyperlipidemia, unspecified: Secondary | ICD-10-CM

## 2024-01-28 ENCOUNTER — Other Ambulatory Visit: Payer: Self-pay

## 2024-01-28 ENCOUNTER — Other Ambulatory Visit: Payer: Self-pay | Admitting: Internal Medicine

## 2024-01-28 MED ORDER — CARVEDILOL 3.125 MG PO TABS
3.1250 mg | ORAL_TABLET | Freq: Two times a day (BID) | ORAL | 3 refills | Status: AC
  Filled 2024-01-28: qty 180, 90d supply, fill #0
  Filled 2024-04-29: qty 180, 90d supply, fill #1
  Filled 2024-08-05: qty 180, 90d supply, fill #2
  Filled 2024-11-03: qty 180, 90d supply, fill #3

## 2024-01-30 ENCOUNTER — Ambulatory Visit
Admission: RE | Admit: 2024-01-30 | Discharge: 2024-01-30 | Disposition: A | Discharge status: Home / Self Care | Payer: Self-pay | Source: Ambulatory Visit | Attending: Internal Medicine | Admitting: Internal Medicine

## 2024-01-30 DIAGNOSIS — I420 Dilated cardiomyopathy: Secondary | ICD-10-CM | POA: Insufficient documentation

## 2024-01-30 DIAGNOSIS — E785 Hyperlipidemia, unspecified: Secondary | ICD-10-CM | POA: Insufficient documentation

## 2024-01-30 DIAGNOSIS — Z0000 Encounter for general adult medical examination without abnormal findings: Secondary | ICD-10-CM | POA: Insufficient documentation

## 2024-01-30 DIAGNOSIS — I5022 Chronic systolic (congestive) heart failure: Secondary | ICD-10-CM | POA: Insufficient documentation

## 2024-02-03 DIAGNOSIS — H2513 Age-related nuclear cataract, bilateral: Secondary | ICD-10-CM | POA: Diagnosis not present

## 2024-02-03 DIAGNOSIS — H43813 Vitreous degeneration, bilateral: Secondary | ICD-10-CM | POA: Diagnosis not present

## 2024-02-18 ENCOUNTER — Telehealth: Payer: Self-pay

## 2024-02-18 NOTE — Telephone Encounter (Signed)
 Pt called the triage line stating she felt some pressure when urinating this morning, pt took a UA test at home and it resulted with some luekocytes. Pt states she started a 5 day course of macrobid that Dr.Stoioff has sent ealier this year. Pt states she has noticed blood when urinating. States this is the first time in many years. There was blood in the toilet when she got up after urinating. Pt was scheduled to see Sam this Friday. Pt was advise to call us if she noticed blood clots or is not able to urinate.

## 2024-02-19 ENCOUNTER — Encounter: Payer: Self-pay | Admitting: Oncology

## 2024-02-19 ENCOUNTER — Other Ambulatory Visit: Payer: Self-pay

## 2024-02-19 ENCOUNTER — Ambulatory Visit
Admission: RE | Admit: 2024-02-19 | Discharge: 2024-02-19 | Disposition: A | Discharge status: Home / Self Care | Payer: Commercial Managed Care - PPO | Source: Ambulatory Visit | Attending: Oncology | Admitting: Oncology

## 2024-02-19 DIAGNOSIS — Z79811 Long term (current) use of aromatase inhibitors: Secondary | ICD-10-CM | POA: Diagnosis not present

## 2024-02-19 DIAGNOSIS — Z08 Encounter for follow-up examination after completed treatment for malignant neoplasm: Secondary | ICD-10-CM | POA: Diagnosis present

## 2024-02-19 DIAGNOSIS — Z853 Personal history of malignant neoplasm of breast: Secondary | ICD-10-CM | POA: Diagnosis not present

## 2024-02-19 DIAGNOSIS — Z1231 Encounter for screening mammogram for malignant neoplasm of breast: Secondary | ICD-10-CM | POA: Diagnosis not present

## 2024-02-19 DIAGNOSIS — R1013 Epigastric pain: Secondary | ICD-10-CM | POA: Diagnosis not present

## 2024-02-19 DIAGNOSIS — C50911 Malignant neoplasm of unspecified site of right female breast: Secondary | ICD-10-CM

## 2024-02-19 MED ORDER — PANTOPRAZOLE SODIUM 40 MG PO TBEC
40.0000 mg | DELAYED_RELEASE_TABLET | Freq: Every day | ORAL | 4 refills | Status: DC
  Filled 2024-02-19: qty 90, 90d supply, fill #0
  Filled 2024-04-15: qty 30, 30d supply, fill #1

## 2024-02-19 MED ORDER — HYOSCYAMINE SULFATE 0.125 MG SL SUBL
0.1250 mg | SUBLINGUAL_TABLET | SUBLINGUAL | 0 refills | Status: DC | PRN
  Filled 2024-02-19: qty 30, 5d supply, fill #0

## 2024-02-20 ENCOUNTER — Other Ambulatory Visit: Payer: Self-pay

## 2024-02-20 MED ORDER — EXEMESTANE 25 MG PO TABS
25.0000 mg | ORAL_TABLET | Freq: Every day | ORAL | 3 refills | Status: DC
  Filled 2024-02-20: qty 30, 30d supply, fill #0
  Filled 2024-03-17: qty 30, 30d supply, fill #1
  Filled 2024-04-15: qty 30, 30d supply, fill #2
  Filled 2024-05-17: qty 30, 30d supply, fill #3

## 2024-02-20 NOTE — Telephone Encounter (Signed)
 Lets send prescription for aromasin(exemestane)

## 2024-02-21 ENCOUNTER — Ambulatory Visit: Admitting: Physician Assistant

## 2024-02-21 ENCOUNTER — Other Ambulatory Visit: Payer: Self-pay

## 2024-02-21 VITALS — BP 113/75 | HR 93 | Ht 66.0 in | Wt 199.0 lb

## 2024-02-21 DIAGNOSIS — N390 Urinary tract infection, site not specified: Secondary | ICD-10-CM

## 2024-02-21 DIAGNOSIS — R310 Gross hematuria: Secondary | ICD-10-CM | POA: Diagnosis not present

## 2024-02-21 LAB — URINALYSIS, COMPLETE
Bilirubin, UA: NEGATIVE
Glucose, UA: NEGATIVE
Ketones, UA: NEGATIVE
Leukocytes,UA: NEGATIVE
Nitrite, UA: NEGATIVE
Protein,UA: NEGATIVE
Specific Gravity, UA: 1.01 (ref 1.005–1.030)
Urobilinogen, Ur: 0.2 mg/dL (ref 0.2–1.0)
pH, UA: 5.5 (ref 5.0–7.5)

## 2024-02-21 LAB — MICROSCOPIC EXAMINATION

## 2024-02-21 MED ORDER — NITROFURANTOIN MONOHYD MACRO 100 MG PO CAPS
100.0000 mg | ORAL_CAPSULE | Freq: Every day | ORAL | 2 refills | Status: DC
  Filled 2024-02-21: qty 30, 30d supply, fill #0
  Filled 2024-03-17: qty 30, 30d supply, fill #1
  Filled 2024-04-15: qty 30, 30d supply, fill #2

## 2024-02-21 NOTE — Progress Notes (Signed)
 02/21/2024 1:29 PM   Marissa Phillips 1961-04-15 [id number]  CC: Chief Complaint  Patient presents with   Hematuria   HPI: Marissa Phillips is a 63 y.o. female with PMH breast cancer managed by Dr. Smith Robert and recurrent UTI on topical vaginal estrogen cream and self start therapy Macrobid who presents today for evaluation of gross hematuria.   Today she reports pressure with urination 3 days ago and gross hematuria associated with a leukocyte positive home UA test.  She started empiric Macrobid and has 1 day remaining in her 5-day course.  Her symptoms have improved significantly and her gross hematuria has resolved.  She remains on estrogen cream, d-mannose, and cranberry supplements for UTI prevention.  Notably, she has noticed increased frequency of UTIs on aromatase inhibitors.  She has had to take a brief break from these medications but will be resuming them today or tomorrow.  She had a CTAP with contrast on 10/07/2023, with no renal masses, hydronephrosis, or urolithiasis.  He has a strong family history of nephrolithiasis.  In-office UA today positive for trace intact blood; urine microscopy with 3-10 RBCs/HPF.  PMH: Past Medical History:  Diagnosis Date   Asthma    Breast cancer (HCC) 2005   Cancer (HCC)    Breast- Right-2005; bilateral-2021   Cardiomyopathy (HCC) 07/14/2020   LVEF 43.7% by cardiac MRI; LVEF 40-45% with goal hypokinesis on repeat cardiac MRI 09/04/2022 (research protocol through [place])   Hyperlipidemia    Diet controlled   Osteopenia    Personal history of chemotherapy    Personal history of radiation therapy    PONV (postoperative nausea and vomiting)     Surgical History: Past Surgical History:  Procedure Laterality Date   BREAST BIOPSY Right 2005   +   BREAST BIOPSY Right 03/01/2020   [place] bx of mass at 2:00 heart marker,  INVASIVE MAMMARY CARCINOMA, NO   BREAST BIOPSY Left 03/01/2020   [place] bx mass at 3:00, coil marker,  INVASIVE MAMMARY CARCINOMA    BREAST BIOPSY Right 03/01/2020   [place] bx of right axilla, no marker placed,BENIGN LYMPH NODE.   BREAST LUMPECTOMY Right 2005   BREAST LUMPECTOMY WITH NEEDLE LOCALIZATION AND AXILLARY SENTINEL LYMPH NODE BX Right 2005   BREAST LUMPECTOMY WITH RADIOACTIVE SEED AND SENTINEL LYMPH NODE BIOPSY Bilateral 04/14/2020   Procedure: BILATERAL RADIOACTIVE SEED GUIDED BREAST LUMPECTOMIES, BILATERAL AXILLARY SENTINEL LYMPH NODE BIOPSIES, WITH BLUE DYE INJECTION RIGHT BREAST;  Surgeon: Ovidio Kin, MD;  Location: [facility] OR;  Service: General;  Laterality: Bilateral;   BREAST SURGERY     COLONOSCOPY WITH PROPOFOL N/A 04/02/2017   Procedure: COLONOSCOPY WITH PROPOFOL;  Surgeon: Wyline Mood, MD;  Location: [facility] ENDOSCOPY;  Service: Endoscopy;  Laterality: N/A;   COLONOSCOPY WITH PROPOFOL N/A 08/29/2022   Procedure: COLONOSCOPY WITH PROPOFOL;  Surgeon: Toledo, Boykin Nearing, MD;  Location: [facility] ENDOSCOPY;  Service: Gastroenterology;  Laterality: N/A;   ESOPHAGOGASTRODUODENOSCOPY (EGD) WITH PROPOFOL N/A 04/02/2017   Procedure: ESOPHAGOGASTRODUODENOSCOPY (EGD) WITH PROPOFOL;  Surgeon: Wyline Mood, MD;  Location: [facility] ENDOSCOPY;  Service: Endoscopy;  Laterality: N/A;   TONSILLECTOMY  ~2000    Home Medications:  Allergies as of 02/21/2024       Reactions   Celebrex [celecoxib] Rash   Clindamycin Hcl Rash   Loratadine Other (See Comments)   fevers   Omeprazole Other (See Comments)   After 1 week of therapy dry mouth and mild nighttime urinary frequency -bothersome enough for her stop it.    Penicillins Rash  Slight redness and itching   Sulfa Antibiotics Rash        Medication List        Accurate as of February 21, 2024  1:29 PM. If you have any questions, ask your nurse or doctor.          acetaminophen 500 MG tablet Commonly known as: TYLENOL Take 500-1,000 mg by mouth 2 (two) times daily as needed (pain.).   carvedilol 3.125 MG tablet Commonly known as: COREG Take 1 tablet (3.125 mg total) by mouth  2 (two) times daily.   cetirizine 10 MG tablet Commonly known as: ZYRTEC Take 10 mg by mouth daily as needed.   estradiol 0.1 MG/GM vaginal cream Commonly known as: ESTRACE Place 1 Application vaginally 2 (two) times a week. Apply a pea-sized amount to fingertip or Q-tip and wipe in vaginal roof twice weekly.   exemestane 25 MG tablet Commonly known as: AROMASIN Take 1 tablet (25 mg total) by mouth daily after breakfast.   famotidine 20 MG tablet Commonly known as: PEPCID Take 20 mg by mouth daily as needed for heartburn or indigestion.   furosemide 20 MG tablet Commonly known as: LASIX Take 1 tablet (20 mg total) by mouth daily as needed for weight gain of 2 - 3 lbs overnight.   melatonin 5 MG Tabs 5 mg at bedtime as needed.   naproxen sodium 220 MG tablet Commonly known as: ALEVE Take 220-440 mg by mouth daily as needed (pain.).   nitrofurantoin (macrocrystal-monohydrate) 100 MG capsule Commonly known as: MACROBID Take by mouth.   Oscimin 0.125 MG SL tablet Generic drug: hyoscyamine Place 1 tablet (0.125 mg total) under the tongue every 4 (four) hours as needed for Cramping for up to 10 days   pantoprazole 40 MG tablet Commonly known as: PROTONIX Take 1 tablet (40 mg total) by mouth once daily        Allergies:  Allergies  Allergen Reactions   Celebrex [Celecoxib] Rash   Clindamycin Hcl Rash   Loratadine Other (See Comments)    fevers   Omeprazole Other (See Comments)    After 1 week of therapy dry mouth and mild nighttime urinary frequency -bothersome enough for her stop it.    Penicillins Rash    Slight redness and itching    Sulfa Antibiotics Rash    Family History: Family History  Problem Relation Age of Onset   Atrial fibrillation Mother    Hypertension Mother    Cancer Mother 35       Breast, now bilateral   Breast cancer Mother 57       right breast ca then left breast ca    Leukemia Mother    Hypertension Father    Hepatitis Father     Heart failure Father    Hypertension Sister     Social History:   reports that she has never smoked. She has never used smokeless tobacco. She reports that she does not drink alcohol and does not use drugs.  Physical Exam: BP 113/75   Pulse 93   Ht 5\' 6"  (1.676 m)   Wt 199 lb (90.3 kg)   LMP  (LMP Unknown)   BMI 32.12 kg/m   Constitutional:  Alert and oriented, no acute distress, nontoxic appearing HEENT: [place], AT Cardiovascular: No clubbing, cyanosis, or edema Respiratory: Normal respiratory effort, no increased work of breathing Skin: No rashes, bruises or suspicious lesions Neurologic: Grossly intact, no focal deficits, moving all 4 extremities Psychiatric: Normal mood and  affect  Laboratory Data: Results for orders placed or performed in visit on 02/21/24  Microscopic Examination   Collection Time: 02/21/24  1:25 PM   Urine  Result Value Ref Range   WBC, UA 0-5 0 - 5 /hpf   RBC, Urine 3-10 (A) 0 - 2 /hpf   Epithelial Cells (non renal) 0-10 0 - 10 /hpf   Bacteria, UA Few None seen/Few  Urinalysis, Complete   Collection Time: 02/21/24  1:25 PM  Result Value Ref Range   Specific Gravity, UA 1.010 1.005 - 1.030   pH, UA 5.5 5.0 - 7.5   Color, UA Yellow Yellow   Appearance Ur Clear Clear   Leukocytes,UA Negative Negative   Protein,UA Negative Negative/Trace   Glucose, UA Negative Negative   Ketones, UA Negative Negative   RBC, UA Trace (A) Negative   Bilirubin, UA Negative Negative   Urobilinogen, Ur 0.2 0.2 - 1.0 mg/dL   Nitrite, UA Negative Negative   Microscopic Examination See below:    Assessment & Plan:   1. Gross hematuria (Primary) Resolved.  She has microscopic hematuria on UA today but UA is otherwise bland.  Unclear if this represents culture appropriate treatment with Macrobid or if there is another noninfectious etiology of her hematuria.  Will plan for lab visit for UA in 1 week and if her hematuria is persistent, will pursue cystoscopy. - Urinalysis,  Complete - Urinalysis, Complete; Future  2. Recurrent UTI UA bland other than microscopic hematuria as above.  Complete current treatment course of Macrobid.  We discussed methenamine versus daily suppressive antibiotics and she wishes to try Macrobid.  Will start with a 29-month course to see if we can down regulate her bladder, though may need to keep her on this while on hormone therapy. - CULTURE, URINE COMPREHENSIVE - nitrofurantoin, macrocrystal-monohydrate, (MACROBID) 100 MG capsule; Take 1 capsule (100 mg total) by mouth daily.  Dispense: 30 capsule; Refill: 2   Return in about 1 week (around 02/28/2024) for Lab visit for UA.  Carman Ching, PA-C  [facility] [address], [address] [place], [place] [address] [phone]

## 2024-02-23 LAB — CULTURE, URINE COMPREHENSIVE

## 2024-03-06 ENCOUNTER — Other Ambulatory Visit

## 2024-03-06 DIAGNOSIS — R310 Gross hematuria: Secondary | ICD-10-CM

## 2024-03-06 LAB — URINALYSIS, COMPLETE
Bilirubin, UA: NEGATIVE
Glucose, UA: NEGATIVE
Ketones, UA: NEGATIVE
Leukocytes,UA: NEGATIVE
Nitrite, UA: NEGATIVE
Protein,UA: NEGATIVE
RBC, UA: NEGATIVE
Specific Gravity, UA: 1.01 (ref 1.005–1.030)
Urobilinogen, Ur: 0.2 mg/dL (ref 0.2–1.0)
pH, UA: 7 (ref 5.0–7.5)

## 2024-03-06 LAB — MICROSCOPIC EXAMINATION
Bacteria, UA: NONE SEEN
RBC, Urine: NONE SEEN /HPF (ref 0–2)

## 2024-03-18 ENCOUNTER — Other Ambulatory Visit: Payer: Self-pay

## 2024-04-15 ENCOUNTER — Other Ambulatory Visit: Payer: Self-pay

## 2024-06-17 ENCOUNTER — Other Ambulatory Visit: Payer: Self-pay | Admitting: Oncology

## 2024-06-17 ENCOUNTER — Other Ambulatory Visit: Payer: Self-pay

## 2024-06-17 DIAGNOSIS — C50911 Malignant neoplasm of unspecified site of right female breast: Secondary | ICD-10-CM

## 2024-06-17 MED ORDER — EXEMESTANE 25 MG PO TABS
25.0000 mg | ORAL_TABLET | Freq: Every day | ORAL | 0 refills | Status: DC
  Filled 2024-06-17: qty 30, 30d supply, fill #0

## 2024-06-22 ENCOUNTER — Ambulatory Visit
Admission: RE | Admit: 2024-06-22 | Discharge: 2024-06-22 | Disposition: A | Discharge status: Home / Self Care | Payer: Commercial Managed Care - PPO | Source: Ambulatory Visit | Attending: Oncology | Admitting: Oncology

## 2024-06-22 DIAGNOSIS — M8589 Other specified disorders of bone density and structure, multiple sites: Secondary | ICD-10-CM | POA: Diagnosis not present

## 2024-06-22 DIAGNOSIS — Z13820 Encounter for screening for osteoporosis: Secondary | ICD-10-CM | POA: Insufficient documentation

## 2024-06-22 DIAGNOSIS — Z79811 Long term (current) use of aromatase inhibitors: Secondary | ICD-10-CM | POA: Diagnosis not present

## 2024-06-22 DIAGNOSIS — Z780 Asymptomatic menopausal state: Secondary | ICD-10-CM | POA: Diagnosis not present

## 2024-07-03 ENCOUNTER — Inpatient Hospital Stay: Payer: Commercial Managed Care - PPO | Attending: Oncology | Admitting: Oncology

## 2024-07-03 ENCOUNTER — Other Ambulatory Visit: Payer: Self-pay

## 2024-07-03 ENCOUNTER — Telehealth: Payer: Self-pay

## 2024-07-03 ENCOUNTER — Encounter: Payer: Self-pay | Admitting: Oncology

## 2024-07-03 VITALS — BP 95/51 | HR 81 | Temp 97.4°F | Resp 18 | Ht 66.0 in | Wt 202.9 lb

## 2024-07-03 DIAGNOSIS — Z8249 Family history of ischemic heart disease and other diseases of the circulatory system: Secondary | ICD-10-CM | POA: Diagnosis not present

## 2024-07-03 DIAGNOSIS — Z9221 Personal history of antineoplastic chemotherapy: Secondary | ICD-10-CM | POA: Insufficient documentation

## 2024-07-03 DIAGNOSIS — Z9089 Acquired absence of other organs: Secondary | ICD-10-CM | POA: Diagnosis not present

## 2024-07-03 DIAGNOSIS — Z923 Personal history of irradiation: Secondary | ICD-10-CM | POA: Diagnosis not present

## 2024-07-03 DIAGNOSIS — Z880 Allergy status to penicillin: Secondary | ICD-10-CM | POA: Insufficient documentation

## 2024-07-03 DIAGNOSIS — R252 Cramp and spasm: Secondary | ICD-10-CM | POA: Diagnosis not present

## 2024-07-03 DIAGNOSIS — Z803 Family history of malignant neoplasm of breast: Secondary | ICD-10-CM | POA: Insufficient documentation

## 2024-07-03 DIAGNOSIS — N6320 Unspecified lump in the left breast, unspecified quadrant: Secondary | ICD-10-CM | POA: Diagnosis not present

## 2024-07-03 DIAGNOSIS — E785 Hyperlipidemia, unspecified: Secondary | ICD-10-CM | POA: Diagnosis not present

## 2024-07-03 DIAGNOSIS — Z08 Encounter for follow-up examination after completed treatment for malignant neoplasm: Secondary | ICD-10-CM | POA: Diagnosis not present

## 2024-07-03 DIAGNOSIS — M85852 Other specified disorders of bone density and structure, left thigh: Secondary | ICD-10-CM | POA: Insufficient documentation

## 2024-07-03 DIAGNOSIS — Z79811 Long term (current) use of aromatase inhibitors: Secondary | ICD-10-CM | POA: Insufficient documentation

## 2024-07-03 DIAGNOSIS — N6310 Unspecified lump in the right breast, unspecified quadrant: Secondary | ICD-10-CM | POA: Diagnosis not present

## 2024-07-03 DIAGNOSIS — Z853 Personal history of malignant neoplasm of breast: Secondary | ICD-10-CM | POA: Insufficient documentation

## 2024-07-03 DIAGNOSIS — Z79899 Other long term (current) drug therapy: Secondary | ICD-10-CM | POA: Diagnosis not present

## 2024-07-03 DIAGNOSIS — Z882 Allergy status to sulfonamides status: Secondary | ICD-10-CM | POA: Diagnosis not present

## 2024-07-03 DIAGNOSIS — Z806 Family history of leukemia: Secondary | ICD-10-CM | POA: Insufficient documentation

## 2024-07-03 DIAGNOSIS — Z8379 Family history of other diseases of the digestive system: Secondary | ICD-10-CM | POA: Diagnosis not present

## 2024-07-03 DIAGNOSIS — C50911 Malignant neoplasm of unspecified site of right female breast: Secondary | ICD-10-CM

## 2024-07-03 DIAGNOSIS — Z886 Allergy status to analgesic agent status: Secondary | ICD-10-CM | POA: Diagnosis not present

## 2024-07-03 DIAGNOSIS — I429 Cardiomyopathy, unspecified: Secondary | ICD-10-CM | POA: Diagnosis not present

## 2024-07-03 MED ORDER — EXEMESTANE 25 MG PO TABS
25.0000 mg | ORAL_TABLET | Freq: Every day | ORAL | 1 refills | Status: DC
  Filled 2024-07-03 – 2024-07-14 (×2): qty 90, 90d supply, fill #0

## 2024-07-03 NOTE — Telephone Encounter (Signed)
 Faxed dental clearance for reclast to dentist fax number [phone]

## 2024-07-03 NOTE — Progress Notes (Signed)
 Hematology/Oncology Consult note [facility] ([facility])  [phone] [phone]  Patient Care Team: Auston Reyes BIRCH, MD as PCP - General (Internal Medicine) End, Lonni, MD as PCP - Cardiology (Cardiology) Dannielle Arlean FALCON, RN (Inactive) as Oncology Nurse Navigator Lenn Aran, MD as Radiation Oncologist (Radiation Oncology) Tama Mems, RN as Registered Nurse Melanee Annah BROCKS, MD as Consulting Physician (Oncology)   Name of the patient: Marissa Phillips  [id number]  08-19-1961   Date of visit: 07/03/24  Diagnosis- history of bilateral breast cancer    Chief complaint/ Reason for visit-routine follow-up of breast cancer  Heme/Onc history: Patient is a 63 year old female with history ofRight breast cancer in 2005 ER positive s/p lumpectomy and adjuvant radiation therapy and 5 years of endocrine therapy.  She was found to have bilateral breast masses based on her mammogram and 2018. Patient went for bilateral lumpectomy.  Right breast pathology showed 0.6 cm invasive ductal carcinoma grade 2 with negative margins and left breast showed 1.2 cm invasive lobular carcinoma.  1 sentinel lymph node on the left side was negative for malignancy.  Both these tumors were ER positive and HER-2 negative.  PR negative.   Oncotype testing was sent on both right and left breast specimens and both came back with an Oncotype score of 24 which puts her at intermediate risk for age.  She would not benefit from adjuvant chemotherapy.  She completed adjuvant radiation therapy and started Aromasin  in September 2021 patient had problems tolerating Aromasin  and was switched to letrozole  in January 2023   Patient was enrolled in research trial in the upbeat study for which she had to get cardiac MRI done which incidentally showed a low EF of 40 to 45%.    Interval history-patient was on Macrobid  prophylaxis for 3 months for recurrent UTIs.  She has been off it since  end of June.  She is presently on exemestane  for breast cancer.  Main side effects have been occasional leg cramps.  She also feels she may be getting more frequent episodes of sinusitis since she has been on endocrine therapy.  She tries to remember to do her lymphedema prevention exercises  ECOG PS- 1 Pain scale- 0   Review of systems- Review of Systems  Constitutional:  Negative for chills, fever, malaise/fatigue and weight loss.  HENT:  Negative for congestion, ear discharge and nosebleeds.   Eyes:  Negative for blurred vision.  Respiratory:  Negative for cough, hemoptysis, sputum production, shortness of breath and wheezing.   Cardiovascular:  Negative for chest pain, palpitations, orthopnea and claudication.  Gastrointestinal:  Negative for abdominal pain, blood in stool, constipation, diarrhea, heartburn, melena, nausea and vomiting.  Genitourinary:  Negative for dysuria, flank pain, frequency, hematuria and urgency.  Musculoskeletal:  Negative for back pain, joint pain and myalgias.  Skin:  Negative for rash.  Neurological:  Negative for dizziness, tingling, focal weakness, seizures, weakness and headaches.  Endo/Heme/Allergies:  Does not bruise/bleed easily.  Psychiatric/Behavioral:  Negative for depression and suicidal ideas. The patient does not have insomnia.       Allergies  Allergen Reactions   Celebrex [Celecoxib] Rash   Clindamycin Hcl Rash   Loratadine Other (See Comments)    fevers   Omeprazole Other (See Comments)    After 1 week of therapy dry mouth and mild nighttime urinary frequency -bothersome enough for her stop it.    Penicillins Rash    Slight redness and itching    Sulfa Antibiotics Rash  Past Medical History:  Diagnosis Date   Asthma    Breast cancer (HCC) 2005   Cancer (HCC)    Breast- Right-2005; bilateral-2021   Cardiomyopathy (HCC) 07/14/2020   LVEF 43.7% by cardiac MRI; LVEF 40-45% with goal hypokinesis on repeat cardiac MRI 09/04/2022  (research protocol through [facility])   Hyperlipidemia    Diet controlled   Osteopenia    Personal history of chemotherapy    Personal history of radiation therapy    PONV (postoperative nausea and vomiting)      Past Surgical History:  Procedure Laterality Date   BREAST BIOPSY Right 2005   +   BREAST BIOPSY Right 03/01/2020   us  bx of mass at 2:00 heart marker,  INVASIVE MAMMARY CARCINOMA, NO   BREAST BIOPSY Left 03/01/2020   us  bx mass at 3:00, coil marker,  INVASIVE MAMMARY CARCINOMA   BREAST BIOPSY Right 03/01/2020   us  bx of right axilla, no marker placed,BENIGN LYMPH NODE.   BREAST LUMPECTOMY Right 2005   BREAST LUMPECTOMY WITH NEEDLE LOCALIZATION AND AXILLARY SENTINEL LYMPH NODE BX Right 2005   BREAST LUMPECTOMY WITH RADIOACTIVE SEED AND SENTINEL LYMPH NODE BIOPSY Bilateral 04/14/2020   Procedure: BILATERAL RADIOACTIVE SEED GUIDED BREAST LUMPECTOMIES, BILATERAL AXILLARY SENTINEL LYMPH NODE BIOPSIES, WITH BLUE DYE INJECTION RIGHT BREAST;  Surgeon: Ethyl Lenis, MD;  Location: [facility] OR;  Service: General;  Laterality: Bilateral;   BREAST SURGERY     COLONOSCOPY WITH PROPOFOL  N/A 04/02/2017   Procedure: COLONOSCOPY WITH PROPOFOL ;  Surgeon: Therisa Bi, MD;  Location: [facility] ENDOSCOPY;  Service: Endoscopy;  Laterality: N/A;   COLONOSCOPY WITH PROPOFOL  N/A 08/29/2022   Procedure: COLONOSCOPY WITH PROPOFOL ;  Surgeon: Toledo, Ladell POUR, MD;  Location: [facility] ENDOSCOPY;  Service: Gastroenterology;  Laterality: N/A;   ESOPHAGOGASTRODUODENOSCOPY (EGD) WITH PROPOFOL  N/A 04/02/2017   Procedure: ESOPHAGOGASTRODUODENOSCOPY (EGD) WITH PROPOFOL ;  Surgeon: Therisa Bi, MD;  Location: [facility] ENDOSCOPY;  Service: Endoscopy;  Laterality: N/A;   TONSILLECTOMY  ~2000    Social History   Socioeconomic History   Marital status: Married    Spouse name: Not on file   Number of children: Not on file   Years of education: Not on file   Highest education level: Not on file  Occupational History   Not on file   Tobacco Use   Smoking status: Never   Smokeless tobacco: Never  Vaping Use   Vaping status: Never Used  Substance and Sexual Activity   Alcohol use: No   Drug use: No   Sexual activity: Not Currently  Other Topics Concern   Not on file  Social History Narrative   Not on file   Social Drivers of Health   Financial Resource Strain: Low Risk  (01/17/2024)   Received from [facility]   Overall Financial Resource Strain (CARDIA)    Difficulty of Paying Living Expenses: Not hard at all  Food Insecurity: No Food Insecurity (01/17/2024)   Received from [facility]   Hunger Vital Sign    Within the past 12 months, you worried that your food would run out before you got the money to buy more.: Never true    Within the past 12 months, the food you bought just didn't last and you didn't have money to get more.: Never true  Transportation Needs: No Transportation Needs (01/17/2024)   Received from [facility] - Transportation    In the past 12 months, has lack of transportation kept you from medical  appointments or from getting medications?: No    Lack of Transportation (Non-Medical): No  Physical Activity: Not on file  Stress: Not on file  Social Connections: Not on file  Intimate Partner Violence: Not on file    Family History  Problem Relation Age of Onset   Atrial fibrillation Mother    Hypertension Mother    Cancer Mother 107       Breast, now bilateral   Breast cancer Mother 18       right breast ca then left breast ca    Leukemia Mother    Hypertension Father    Hepatitis Father    Heart failure Father    Hypertension Sister      Current Outpatient Medications:    acetaminophen  (TYLENOL ) 500 MG tablet, Take 500-1,000 mg by mouth 2 (two) times daily as needed (pain.)., Disp: , Rfl:    carvedilol  (COREG ) 3.125 MG tablet, Take 1 tablet (3.125 mg total) by mouth 2 (two) times daily., Disp: 180 tablet, Rfl: 3    cetirizine (ZYRTEC) 10 MG tablet, Take 10 mg by mouth daily as needed., Disp: , Rfl:    estradiol  (ESTRACE ) 0.1 MG/GM vaginal cream, Place 1 Application vaginally 2 (two) times a week. Apply a pea-sized amount to fingertip or Q-tip and wipe in vaginal roof twice weekly., Disp: 42.5 g, Rfl: 1   hyoscyamine  (LEVSIN  SL) 0.125 MG SL tablet, Place 1 tablet (0.125 mg total) under the tongue every 4 (four) hours as needed for Cramping for up to 10 days, Disp: 30 tablet, Rfl: 0   melatonin 5 MG TABS, 5 mg at bedtime as needed., Disp: , Rfl:    naproxen sodium (ALEVE) 220 MG tablet, Take 220-440 mg by mouth daily as needed (pain.)., Disp: , Rfl:    nitrofurantoin , macrocrystal-monohydrate, (MACROBID ) 100 MG capsule, Take by mouth., Disp: , Rfl:    exemestane  (AROMASIN ) 25 MG tablet, Take 1 tablet (25 mg total) by mouth daily after breakfast., Disp: 90 tablet, Rfl: 1  Physical exam:  Vitals:   07/03/24 1021  BP: (!) 95/51  Pulse: 81  Resp: 18  Temp: (!) 97.4 F (36.3 C)  TempSrc: Tympanic  SpO2: 98%  Weight: 202 lb 14.4 oz (92 kg)  Height: 5' 6 (1.676 m)   Physical Exam Cardiovascular:     Rate and Rhythm: Normal rate and regular rhythm.     Heart sounds: Normal heart sounds.  Pulmonary:     Effort: Pulmonary effort is normal.     Breath sounds: Normal breath sounds.  Skin:    General: Skin is warm and dry.  Neurological:     Mental Status: She is alert and oriented to person, place, and time.      I have personally reviewed labs listed below:    Latest Ref Rng & Units 10/07/2023    9:43 AM  CMP  Glucose 70 - 99 mg/dL 878   BUN 8 - 23 mg/dL 10   Creatinine 9.55 - 1.00 mg/dL 9.20   Sodium 864 - 854 mmol/L 138   Potassium 3.5 - 5.1 mmol/L 3.8   Chloride 98 - 111 mmol/L 105   CO2 22 - 32 mmol/L 25   Calcium 8.9 - 10.3 mg/dL 9.0   Total Protein 6.5 - 8.1 g/dL 7.7   Total Bilirubin <8.7 mg/dL 0.5   Alkaline Phos 38 - 126 U/L 84   AST 15 - 41 U/L 24   ALT 0 - 44 U/L 18  Latest Ref Rng & Units 10/07/2023    9:43 AM  CBC  WBC 4.0 - 10.5 K/uL 4.9   Hemoglobin 12.0 - 15.0 g/dL 87.0   Hematocrit 63.9 - 46.0 % 38.4   Platelets 150 - 400 K/uL 224    I have personally reviewed Radiology images listed below: No images are attached to the encounter.  DG Bone Density Result Date: 06/22/2024 EXAM: DUAL X-RAY ABSORPTIOMETRY (DXA) FOR BONE MINERAL DENSITY 06/22/2024 10:25 am CLINICAL DATA:  63 year old Female Postmenopausal. Bone density scan; Screening for osteoporosis TECHNIQUE: An axial (e.g., hips, spine) and/or appendicular (e.g., radius) exam was performed, as appropriate, using GE Secretary/administrator at [facility]. Images are obtained for bone mineral density measurement and are not obtained for diagnostic purposes. [id number] Exclusions: L1-L2 due to degenerative changes COMPARISON:  06/19/2022 FINDINGS: Scan quality: Good. LUMBAR SPINE (L3-L4): BMD (in g/cm2): 1.153 T-score: -0.5 Z-score: 0.8 Rate of change from previous exam: No significant rate of change from previous exam. LEFT FEMORAL NECK: BMD (in g/cm2): 0.706 T-score: -2.4 Z-score: -1.1 LEFT TOTAL HIP: BMD (in g/cm2): 0.795 T-score: -1.7 Z-score: -0.7 RIGHT FEMORAL NECK: BMD (in g/cm2): 0.815 T-score: -1.6 Z-score: -0.3 RIGHT TOTAL HIP: BMD (in g/cm2): 0.847 T-score: -1.3 Z-score: -0.3 DUAL-FEMUR TOTAL MEAN: Rate of change from previous exam: -6.8% FRAX 10-YEAR PROBABILITY OF FRACTURE: Based on the World Health Organization FRAX model, the 10 year probability of a major osteoporotic fracture is 18.5%. The 10 year probability of a hip fracture is 3.3%. IMPRESSION: Osteopenia based on BMD. Fracture risk is increased. RECOMMENDATIONS: 1. All patients should optimize calcium and vitamin D intake. 2. Consider FDA-approved medical therapies in postmenopausal women and men aged 57 years and older, based on the following: - A hip or vertebral (clinical or morphometric) fracture - T-score less than or  equal to -2.5 and secondary causes have been excluded. - Low bone mass (T-score between -1.0 and -2.5) and a 10-year probability of a hip fracture greater than or equal to 3% or a 10-year probability of a major osteoporosis-related fracture greater than or equal to 20% based on the [place] -adapted WHO algorithm. - Clinician judgment and/or patient preferences may indicate treatment for people with 10-year fracture probabilities above or below these levels 3. Patients with diagnosis of osteoporosis or at high risk for fracture should have regular bone mineral density tests. For patients eligible for Medicare, routine testing is allowed once every 2 years. The testing frequency can be increased to one year for patients who have rapidly progressing disease, those who are receiving or discontinuing medical therapy to restore bone mass, or have additional risk factors. Electronically Signed   By: Reyes Phi M.D.   On: 06/22/2024 16:55     Assessment and plan- Patient is a 63 y.o. female with history of bilateral ER positive breast cancer s/p lumpectomy and adjuvant radiation therapy.  She is presently on exemestane  and this is a routine follow-up visit for breast cancer.  Clinically patient is doing well with no concerning signs and symptoms of recurrence based on today's exam.  Her mammogram from March 2025 was unremarkable.  She will continue taking exemestane  at this time.  Discussed the results of bone density scan which shows worsening osteopenia especially in her left femur neck when the T-score was -2.4 as compared to -1.6 prior.  10-year probability of a major osteoporotic fracture is increased to 8.5% and hip fracture of 3.3%.  We discussed exemestane  versus alternative such as tamoxifen.  Patient is agreeable to continuing exemestane  at this time.  With regards to her osteopenia I think she would benefit from adjuvant bisphosphonates given that her 10-year probability of a major hip fracture is more than  3%.  We discussed oral versus parenteral bisphosphonates.  Patient has had issues with reflux with Boniva in the past.  She prefers parenteral bisphosphonates.  I would recommend yearly Reclast for her.  Discussed risks and benefits of Reclast including all but not limited to possible risk of osteonecrosis of the jaw and hypocalcemia.  Will obtain dental clearance prior.  She will tentatively proceed with Reclast in 2 weeks time.  I will see her back in 6 months no labs   Visit Diagnosis 1. Use of exemestane  (Aromasin )   2. Encounter for follow-up surveillance of breast cancer   3. High risk medication use   4. Osteopenia of neck of left femur      Dr. Annah Skene, MD, MPH [facility] [phone] 07/03/2024 12:59 PM

## 2024-07-03 NOTE — Telephone Encounter (Signed)
 [id number] Understanding and Predicting Breast Cancer Events after Treatment (UPBEAT)  Study Follow-up - Year 4   Ms. Tibbetts was contacted by phone regarding the Year 4 follow-up for the above study. Patient reported doing well and denied any hospitalizations since the last follow-up call on 07/04/2023. She also denied experiencing any cardiac events, including myocardial infarction (MI), percutaneous coronary intervention (PCI), coronary artery bypass grafting (CABG), heart catheterization, cerebrovascular accident (CVA), or a diagnosis of heart failure since the last contact.   Patient confirmed receipt of the study email regarding the annual follow-up questionnaire. She has completed the [employer] questionnaire online.   Patient confirmed that her email address remains the same. She was informed that the next follow-up call will be in approximately one year.    Patient stated she has no questions at this time and she knows to contact the research team with any study-related concerns in the future. Patient was thanked for her time and her continued participation to the study.   Lenyx Boody, Ph.D. Clinical Research Coordinator [phone] 07/03/2024 3:52 PM

## 2024-07-03 NOTE — Progress Notes (Signed)
 Had mammogram 02/19/24 and bone density 06/22/24. CT cardiac 01/30/24.  Needs refill aromasin , pended.

## 2024-07-06 ENCOUNTER — Other Ambulatory Visit: Payer: Self-pay | Admitting: Oncology

## 2024-07-09 ENCOUNTER — Telehealth: Payer: Self-pay

## 2024-07-09 ENCOUNTER — Telehealth: Payer: Self-pay | Admitting: Oncology

## 2024-07-09 NOTE — Telephone Encounter (Signed)
 Pt had questions about her treatment next week. Requested clinical give her a call back. Wants to know what to expect. - LH

## 2024-07-09 NOTE — Telephone Encounter (Signed)
 Per secure chat received Hey - pt called. Seems like she's having some anxiety about the infusion on the 22nd. She asked if someone could give her a call to talk about what to expect from the procedure and to see if she needed to take Tylenol  before coming. Also had a question about financial stuff regarding the infusion. I am messaging someone else about that. [phone].  Outbound call; spoke to patient and reviewed infusion is tolerated fairly well and may experience some body aches, pains and flu-like symptoms that typically resolve within 24-48 hrs post infusion.  Also informed that medications such as Tylenol  can be provided if needed.  Patient inquired if a dental clearance has already been obtained; informed I would review the chart accordingly (dental clearance on file scanned into media 07/06/24).  Patient has no further questions / concerns at this time.

## 2024-07-14 ENCOUNTER — Other Ambulatory Visit: Payer: Self-pay

## 2024-07-16 ENCOUNTER — Other Ambulatory Visit: Payer: Self-pay | Admitting: *Deleted

## 2024-07-16 DIAGNOSIS — M85852 Other specified disorders of bone density and structure, left thigh: Secondary | ICD-10-CM

## 2024-07-16 DIAGNOSIS — Z79899 Other long term (current) drug therapy: Secondary | ICD-10-CM

## 2024-07-16 DIAGNOSIS — Z124 Encounter for screening for malignant neoplasm of cervix: Secondary | ICD-10-CM | POA: Diagnosis not present

## 2024-07-16 NOTE — Progress Notes (Signed)
 cmp

## 2024-07-17 ENCOUNTER — Inpatient Hospital Stay

## 2024-07-17 VITALS — BP 117/77 | HR 77 | Temp 98.6°F | Resp 16

## 2024-07-17 DIAGNOSIS — R252 Cramp and spasm: Secondary | ICD-10-CM | POA: Diagnosis not present

## 2024-07-17 DIAGNOSIS — Z9221 Personal history of antineoplastic chemotherapy: Secondary | ICD-10-CM | POA: Diagnosis not present

## 2024-07-17 DIAGNOSIS — Z79899 Other long term (current) drug therapy: Secondary | ICD-10-CM | POA: Diagnosis not present

## 2024-07-17 DIAGNOSIS — Z79811 Long term (current) use of aromatase inhibitors: Secondary | ICD-10-CM | POA: Diagnosis not present

## 2024-07-17 DIAGNOSIS — N6320 Unspecified lump in the left breast, unspecified quadrant: Secondary | ICD-10-CM | POA: Diagnosis not present

## 2024-07-17 DIAGNOSIS — N6310 Unspecified lump in the right breast, unspecified quadrant: Secondary | ICD-10-CM | POA: Diagnosis not present

## 2024-07-17 DIAGNOSIS — M85852 Other specified disorders of bone density and structure, left thigh: Secondary | ICD-10-CM

## 2024-07-17 DIAGNOSIS — I429 Cardiomyopathy, unspecified: Secondary | ICD-10-CM | POA: Diagnosis not present

## 2024-07-17 DIAGNOSIS — Z853 Personal history of malignant neoplasm of breast: Secondary | ICD-10-CM | POA: Diagnosis not present

## 2024-07-17 DIAGNOSIS — E785 Hyperlipidemia, unspecified: Secondary | ICD-10-CM | POA: Diagnosis not present

## 2024-07-17 DIAGNOSIS — M85859 Other specified disorders of bone density and structure, unspecified thigh: Secondary | ICD-10-CM

## 2024-07-17 LAB — CMP (CANCER CENTER ONLY)
ALT: 17 U/L (ref 0–44)
AST: 23 U/L (ref 15–41)
Albumin: 4.2 g/dL (ref 3.5–5.0)
Alkaline Phosphatase: 84 U/L (ref 38–126)
Anion gap: 8 (ref 5–15)
BUN: 10 mg/dL (ref 8–23)
CO2: 27 mmol/L (ref 22–32)
Calcium: 9.3 mg/dL (ref 8.9–10.3)
Chloride: 102 mmol/L (ref 98–111)
Creatinine: 0.83 mg/dL (ref 0.44–1.00)
GFR, Estimated: >60 mL/min (ref >60)
Glucose, Bld: 84 mg/dL (ref 70–99)
Potassium: 4 mmol/L (ref 3.5–5.1)
Sodium: 137 mmol/L (ref 135–145)
Total Bilirubin: 0.7 mg/dL (ref 0.0–1.2)
Total Protein: 7.8 g/dL (ref 6.5–8.1)

## 2024-07-17 MED ORDER — SODIUM CHLORIDE 0.9 % IV SOLN
INTRAVENOUS | Status: DC
  Filled 2024-07-17: qty 250

## 2024-07-17 MED ORDER — ZOLEDRONIC ACID 5 MG/100ML IV SOLN
5.0000 mg | Freq: Once | INTRAVENOUS | Status: AC
  Administered 2024-07-17: 5 mg via INTRAVENOUS
  Filled 2024-07-17: qty 100

## 2024-09-28 DIAGNOSIS — C50012 Malignant neoplasm of nipple and areola, left female breast: Secondary | ICD-10-CM | POA: Diagnosis not present

## 2024-09-28 DIAGNOSIS — C50011 Malignant neoplasm of nipple and areola, right female breast: Secondary | ICD-10-CM | POA: Diagnosis not present

## 2024-09-28 DIAGNOSIS — Z170 Estrogen receptor positive status [ER+]: Secondary | ICD-10-CM | POA: Diagnosis not present

## 2024-10-06 ENCOUNTER — Encounter: Payer: Self-pay | Admitting: Oncology

## 2024-10-07 ENCOUNTER — Other Ambulatory Visit: Payer: Self-pay

## 2024-10-07 ENCOUNTER — Telehealth: Payer: Self-pay

## 2024-10-07 MED ORDER — ANASTROZOLE 1 MG PO TABS
1.0000 mg | ORAL_TABLET | Freq: Every day | ORAL | 3 refills | Status: AC
  Filled 2024-10-07: qty 30, 30d supply, fill #0
  Filled 2024-11-03: qty 30, 30d supply, fill #1
  Filled 2024-12-03: qty 30, 30d supply, fill #2

## 2024-10-07 NOTE — Telephone Encounter (Signed)
 Called patient to make sure it was okay to send in her Arimidex to [facility] pharmacy.   Patient stated she was okay with that & also stated she will start medication in roughly 2 weeks.

## 2024-10-07 NOTE — Telephone Encounter (Signed)
 Yes for arimidex

## 2024-11-06 ENCOUNTER — Ambulatory Visit
Admission: EM | Admit: 2024-11-06 | Discharge: 2024-11-06 | Disposition: A | Discharge status: Home / Self Care | Attending: Emergency Medicine | Admitting: Emergency Medicine

## 2024-11-06 ENCOUNTER — Encounter: Payer: Self-pay | Admitting: Emergency Medicine

## 2024-11-06 DIAGNOSIS — R051 Acute cough: Secondary | ICD-10-CM

## 2024-11-06 DIAGNOSIS — J22 Unspecified acute lower respiratory infection: Secondary | ICD-10-CM | POA: Diagnosis not present

## 2024-11-06 MED ORDER — DOXYCYCLINE HYCLATE 100 MG PO CAPS: 100.0000 mg | ORAL_CAPSULE | Freq: Two times a day (BID) | ORAL | 0 refills | Status: AC

## 2024-11-06 MED ORDER — PROMETHAZINE-DM 6.25-15 MG/5ML PO SYRP: 5.0000 mL | ORAL_SOLUTION | Freq: Four times a day (QID) | ORAL | 0 refills | Status: DC | PRN

## 2024-11-06 NOTE — Discharge Instructions (Addendum)
 Take antibiotic as directed(doxycycline ), take phenergan DM for cough-drowsiness precautions.  Rest,push fluids. May use OTC meds of choice(Mucinex, tylenol ,etc) as label directed. Follow up with PCP in 3 days if no improvement,sooner if worse.

## 2024-11-06 NOTE — ED Provider Notes (Signed)
 [facility]    CSN: [id number] Arrival date & time: 11/06/24  1913      History   Chief Complaint Chief Complaint  Patient presents with   Cough   Fever    HPI Marissa Phillips is a 63 y.o. female.   63 year old female, Marissa Phillips, presents to urgent care for worsening URI(cough,congestion,fever) symptoms for 1 week. Pt was around husband who has been sick as well was at doctor office this week and may have gotten worse from taking husband to doctor as well. Husband has procedure next week and pt is sole caregiver.   The history is provided by the patient. No language interpreter was used.    Past Medical History:  Diagnosis Date   Asthma    Breast cancer (HCC) 2005   Cancer (HCC)    Breast- Right-2005; bilateral-2021   Cardiomyopathy (HCC) 07/14/2020   LVEF 43.7% by cardiac MRI; LVEF 40-45% with goal hypokinesis on repeat cardiac MRI 09/04/2022 (research protocol through [facility])   Hyperlipidemia    Diet controlled   Osteopenia    Personal history of chemotherapy    Personal history of radiation therapy    PONV (postoperative nausea and vomiting)     Patient Active Problem List   Diagnosis Date Noted   Acute respiratory infection 11/06/2024   Acute cough 11/06/2024   Cardiomyopathy (HCC) 11/09/2022   Heart failure with mildly reduced ejection fraction (HFmrEF) (HCC) 11/09/2022   Osteopenia 01/12/2022   Lymphedema of breast 10/31/2021   Goals of care, counseling/discussion 03/10/2020   Bilateral malignant neoplasm of breast in female (HCC) 03/10/2020   Hx of adenomatous colonic polyps 04/29/2018   Hyperlipidemia, mild 01/02/2016   Anemia 07/06/2014   Asthma 07/06/2014   Environmental allergies 07/06/2014   History of recurrent UTIs 07/06/2014   Screening for cervical cancer 09/25/2011   History of breast cancer 09/25/2011    Past Surgical History:  Procedure Laterality Date   BREAST BIOPSY Right 2005   +   BREAST BIOPSY Right 03/01/2020   us   bx of mass at 2:00 heart marker,  INVASIVE MAMMARY CARCINOMA, NO   BREAST BIOPSY Left 03/01/2020   us  bx mass at 3:00, coil marker,  INVASIVE MAMMARY CARCINOMA   BREAST BIOPSY Right 03/01/2020   us  bx of right axilla, no marker placed,BENIGN LYMPH NODE.   BREAST LUMPECTOMY Right 2005   BREAST LUMPECTOMY WITH NEEDLE LOCALIZATION AND AXILLARY SENTINEL LYMPH NODE BX Right 2005   BREAST LUMPECTOMY WITH RADIOACTIVE SEED AND SENTINEL LYMPH NODE BIOPSY Bilateral 04/14/2020   Procedure: BILATERAL RADIOACTIVE SEED GUIDED BREAST LUMPECTOMIES, BILATERAL AXILLARY SENTINEL LYMPH NODE BIOPSIES, WITH BLUE DYE INJECTION RIGHT BREAST;  Surgeon: Ethyl Lenis, MD;  Location: [facility] OR;  Service: General;  Laterality: Bilateral;   BREAST SURGERY     COLONOSCOPY WITH PROPOFOL  N/A 04/02/2017   Procedure: COLONOSCOPY WITH PROPOFOL ;  Surgeon: Therisa Bi, MD;  Location: [facility] ENDOSCOPY;  Service: Endoscopy;  Laterality: N/A;   COLONOSCOPY WITH PROPOFOL  N/A 08/29/2022   Procedure: COLONOSCOPY WITH PROPOFOL ;  Surgeon: Toledo, Ladell POUR, MD;  Location: [facility] ENDOSCOPY;  Service: Gastroenterology;  Laterality: N/A;   ESOPHAGOGASTRODUODENOSCOPY (EGD) WITH PROPOFOL  N/A 04/02/2017   Procedure: ESOPHAGOGASTRODUODENOSCOPY (EGD) WITH PROPOFOL ;  Surgeon: Therisa Bi, MD;  Location: [facility] ENDOSCOPY;  Service: Endoscopy;  Laterality: N/A;   TONSILLECTOMY  ~2000    OB History   No obstetric history on file.      Home Medications    Prior to Admission medications  Medication  Sig Start Date End Date Taking? Authorizing Provider  doxycycline  (VIBRAMYCIN ) 100 MG capsule Take 1 capsule (100 mg total) by mouth 2 (two) times daily for 7 days. 11/06/24 11/13/24 Yes Huber Mathers, NP  promethazine-dextromethorphan (PROMETHAZINE-DM) 6.25-15 MG/5ML syrup Take 5 mLs by mouth 4 (four) times daily as needed for cough. 11/06/24  Yes Jamiah Recore, Rilla, NP  acetaminophen  (TYLENOL ) 500 MG tablet Take 500-1,000 mg by mouth 2 (two) times daily  as needed (pain.).    [provider]  anastrozole  (ARIMIDEX ) 1 MG tablet Take 1 tablet (1 mg total) by mouth daily. 10/07/24   Melanee Annah BROCKS, MD  calcium carbonate (OSCAL) 1500 (600 Ca) MG TABS tablet Take 600 mg of elemental calcium by mouth 2 (two) times daily with a meal.    [provider]  carvedilol  (COREG ) 3.125 MG tablet Take 1 tablet (3.125 mg total) by mouth 2 (two) times daily. 01/28/24 02/03/25  End, Lonni, MD  cetirizine (ZYRTEC) 10 MG tablet Take 10 mg by mouth daily as needed.    [provider]  Cholecalciferol (VITAMIN D-1000 MAX ST) 25 MCG (1000 UT) tablet Take 1,000 Units by mouth daily.    [provider]  estradiol  (ESTRACE ) 0.1 MG/GM vaginal cream Place 1 Application vaginally 2 (two) times a week. Apply a pea-sized amount to fingertip or Q-tip and wipe in vaginal roof twice weekly. 10/28/23   Stoioff, Glendia BROCKS, MD  exemestane  (AROMASIN ) 25 MG tablet Take 1 tablet (25 mg total) by mouth daily after breakfast. 07/03/24   Melanee Annah BROCKS, MD  hyoscyamine  (LEVSIN  SL) 0.125 MG SL tablet Place 1 tablet (0.125 mg total) under the tongue every 4 (four) hours as needed for Cramping for up to 10 days 02/19/24 07/03/24    melatonin 5 MG TABS 5 mg at bedtime as needed. 06/12/21   [provider]  naproxen sodium (ALEVE) 220 MG tablet Take 220-440 mg by mouth daily as needed (pain.).    [provider]  nitrofurantoin , macrocrystal-monohydrate, (MACROBID ) 100 MG capsule Take by mouth.    [provider]    Family History Family History  Problem Relation Age of Onset   Atrial fibrillation Mother    Hypertension Mother    Cancer Mother 23       Breast, now bilateral   Breast cancer Mother 34       right breast ca then left breast ca    Leukemia Mother    Hypertension Father    Hepatitis Father    Heart failure Father    Hypertension Sister     Social History Social History[1]   Allergies   Celebrex [celecoxib],  Clindamycin hcl, Loratadine, Omeprazole, Penicillins, and Sulfa antibiotics   Review of Systems Review of Systems  Constitutional:  Positive for fever.  HENT:  Positive for congestion, postnasal drip, sinus pressure and sinus pain.   Respiratory:  Positive for cough.   All other systems reviewed and are negative.    Physical Exam Triage Vital Signs ED Triage Vitals  Encounter Vitals Group     BP      Girls Systolic BP Percentile      Girls Diastolic BP Percentile      Boys Systolic BP Percentile      Boys Diastolic BP Percentile      Pulse      Resp      Temp      Temp src      SpO2      Weight  Height      Head Circumference      Peak Flow      Pain Score      Pain Loc      Pain Education      Exclude from Growth Chart    No data found.  Updated Vital Signs BP (!) 153/87 (BP Location: Left Arm)   Pulse 97   Temp 99.8 F (37.7 C) (Oral)   Resp 14   Ht 5' 6 (1.676 m)   Wt 202 lb 13.2 oz (92 kg)   LMP  (LMP Unknown)   SpO2 96%   BMI 32.74 kg/m   Visual Acuity Right Eye Distance:   Left Eye Distance:   Bilateral Distance:    Right Eye Near:   Left Eye Near:    Bilateral Near:     Physical Exam Vitals and nursing note reviewed.  Constitutional:      General: She is not in acute distress.    Appearance: She is well-developed and well-groomed.  HENT:     Head: Normocephalic and atraumatic.     Right Ear: Tympanic membrane is retracted.     Left Ear: Tympanic membrane is retracted.     Nose: Mucosal edema and congestion present.     Right Sinus: Maxillary sinus tenderness present.     Left Sinus: Maxillary sinus tenderness present.     Mouth/Throat:     Lips: Pink.     Mouth: Mucous membranes are moist.     Pharynx: Postnasal drip present.     Comments: Thick yellow drainage Eyes:     Conjunctiva/sclera: Conjunctivae normal.  Cardiovascular:     Rate and Rhythm: Normal rate and regular rhythm.     Heart sounds: Normal heart sounds. No  murmur heard. Pulmonary:     Effort: Pulmonary effort is normal. No respiratory distress.     Breath sounds: Normal breath sounds and air entry.  Abdominal:     Palpations: Abdomen is soft.     Tenderness: There is no abdominal tenderness.  Musculoskeletal:        General: No swelling.     Cervical back: Neck supple.  Skin:    General: Skin is warm and dry.     Capillary Refill: Capillary refill takes less than 2 seconds.  Neurological:     General: No focal deficit present.     Mental Status: She is alert and oriented to person, place, and time.     GCS: GCS eye subscore is 4. GCS verbal subscore is 5. GCS motor subscore is 6.  Psychiatric:        Mood and Affect: Mood normal.        Behavior: Behavior is cooperative.      UC Treatments / Results  Labs (all labs ordered are listed, but only abnormal results are displayed) Labs Reviewed - No data to display  EKG   Radiology No results found.  Procedures Procedures (including critical care time)  Medications Ordered in UC Medications - No data to display  Initial Impression / Assessment and Plan / UC Course  I have reviewed the triage vital signs and the nursing notes.  Pertinent labs & imaging results that were available during my care of the patient were reviewed by me and considered in my medical decision making (see chart for details).    Discussed exam findings and plan of care with patient, scripted doxycycline  for acute respiratory infection,phenergan DM for cough, strict go to ER precautions  given.   Patient verbalized understanding to this provider.  Ddx: Acute respiratory infection, cough, viral illness,allergies Final Clinical Impressions(s) / UC Diagnoses   Final diagnoses:  Acute respiratory infection  Acute cough     Discharge Instructions      Take antibiotic as directed(doxycycline ), take phenergan DM for cough-drowsiness precautions.  Rest,push fluids. May use OTC meds of choice(Mucinex,  tylenol ,etc) as label directed. Follow up with PCP in 3 days if no improvement,sooner if worse.     ED Prescriptions     Medication Sig Dispense Auth. Provider   doxycycline  (VIBRAMYCIN ) 100 MG capsule Take 1 capsule (100 mg total) by mouth 2 (two) times daily for 7 days. 14 capsule Cannie Muckle, NP   promethazine-dextromethorphan (PROMETHAZINE-DM) 6.25-15 MG/5ML syrup Take 5 mLs by mouth 4 (four) times daily as needed for cough. 118 mL Koran Seabrook, NP      PDMP not reviewed this encounter.     [1]  Social History Tobacco Use   Smoking status: Never   Smokeless tobacco: Never  Vaping Use   Vaping status: Never Used  Substance Use Topics   Alcohol use: No   Drug use: No     Avarie Tavano, Rilla, NP 11/06/24 2019

## 2024-11-06 NOTE — ED Triage Notes (Signed)
 Patient c/o cough and chest congestion for 6 days.  Patient reports fever that yesterday and today.

## 2024-12-02 ENCOUNTER — Ambulatory Visit: Admitting: Physician Assistant

## 2024-12-02 VITALS — BP 110/82 | HR 73 | Ht 66.0 in | Wt 196.0 lb

## 2024-12-02 DIAGNOSIS — I5020 Unspecified systolic (congestive) heart failure: Secondary | ICD-10-CM | POA: Diagnosis not present

## 2024-12-02 DIAGNOSIS — E785 Hyperlipidemia, unspecified: Secondary | ICD-10-CM | POA: Diagnosis not present

## 2024-12-02 DIAGNOSIS — I340 Nonrheumatic mitral (valve) insufficiency: Secondary | ICD-10-CM

## 2024-12-02 NOTE — Patient Instructions (Addendum)
 Medication Instructions:  None ordered at this time  *If you need a refill on your cardiac medications before your next appointment, please call your pharmacy*  Lab Work: None ordered at this time  If you have labs (blood work) drawn today and your tests are completely normal, you will receive your results only by: MyChart Message (if you have MyChart) OR A paper copy in the mail If you have any lab test that is abnormal or we need to change your treatment, we will call you to review the results.  Testing/Procedures: Your physician has requested that you have an echocardiogram in 12 months. Echocardiography is a painless test that uses sound waves to create images of your heart. It provides your doctor with information about the size and shape of your heart and how well your hearts chambers and valves are working.   You may receive an ultrasound enhancing agent through an IV if needed to better visualize your heart during the echo. This procedure takes approximately one hour.  There are no restrictions for this procedure.  This will take place at [address] [facility] [address], [place] [address]  Please note: We ask at that you not bring children with you during ultrasound (echo/ vascular) testing. Due to room size and safety concerns, children are not allowed in the ultrasound rooms during exams. Our front office staff cannot provide observation of children in our lobby area while testing is being conducted. An adult accompanying a patient to their appointment will only be allowed in the ultrasound room at the discretion of the ultrasound technician under special circumstances. We apologize for any inconvenience.   Follow-Up: At [facility], you and your health needs are our priority.  As part of our continuing mission to provide you with exceptional heart care, our providers are all part of one team.  This team includes your primary Cardiologist (physician) and  Advanced Practice Providers or APPs (Physician Assistants and Nurse Practitioners) who all work together to provide you with the care you need, when you need it.  Your next appointment:   12 month(s) after Echocardiogram, please  Provider:   You may see Lonni Hanson, MD or Bernardino Bring, PA-C  We recommend signing up for the patient portal called MyChart.  Sign up information is provided on this After Visit Summary.  MyChart is used to connect with patients for Virtual Visits (Telemedicine).  Patients are able to view lab/test results, encounter notes, upcoming appointments, etc.  Non-urgent messages can be sent to your provider as well.   To learn more about what you can do with MyChart, go to [email or web address].

## 2024-12-02 NOTE — Progress Notes (Signed)
 "  Cardiology Office Note    Date:  12/02/2024   ID:  Marissa Phillips, DOB November 03, 1961, MRN [id number]  PCP:  Auston Reyes BIRCH, MD  Cardiologist:  Lonni Hanson, MD  Electrophysiologist:  None   Chief Complaint: Follow up  History of Present Illness:   Marissa Phillips is a 64 y.o. female with history of recurrent breast cancer initially treated with lumpectomy and chemoradiation right breast cancer in 2005 with her chemotherapy regimen including Adriamycin with recurrent bilateral breast cancer in 2021 managed with lumpectomies and radiation followed by aromatase inhibitor therapy, cardiomyopathy, mitral regurgitation, HLD, and frequent UTIs who presents for follow up of cardiomyopathy.  She was evaluated as a new patient by Dr. Hanson on 11/07/2022 at the request of her PCP for evaluation of abnormal cardiac MRI. It was noted at that time she had been participating in a research study through [facility] that had included serial cardiac MRIs. She brought a limited report with her to her initial visit with Dr. Hanson that indicated a suspected LVEF of 40 to 45% on a study in 08/2022. There was concern that an initial scan in 06/2020 showed an EF of 43.7%. Repeat a scan in 09/2020 did not make note of any abnormalities. At her visit with Dr. Hanson, she denied a history of heart problems and was without symptoms of angina. She did note occasional shortness of breath but attributed this mostly to rhinorrhea and sinus congestion that she attributed to aromatase inhibitors. She was without difficulties walking on a treadmill with her husband.  To further evaluate her cardiomyopathy, she underwent echo on 12/28/2022 showed an EF 50 to 55%, no regional wall motion abnormalities, grade 1 diastolic dysfunction, normal RV systolic function and ventricular cavity size, mild mitral regurgitation, and an estimated right atrial pressure of 3 mmHg. She was last seen in the office in 11/2023 and remained without symptoms of angina  or cardiac decompensation with no changes indicated at that time.    Calcium score 01/2024 of 0 with no significant extra-cardiac thoracic findings at that time.   She comes in continuing to do well from a cardiac perspective and remains without symptoms of angina or cardiac decompensation.  No progressive dyspnea, orthopnea, PND, or lower extremity swelling.  Remains active at baseline.  Her husband will likely be undergoing CABG through [facility] in the near future.  She will be the primary caretaker thereafter.  She did noted occasional fluttering approximately 4 months ago that was short-lived, none since.  Her weight is down 5 pounds today when compared to her visit in 11/2023, this is intentional with lifestyle modification.  She does not have any acute cardiac concerns at this time.   Labs independently reviewed: 06/2024 - potassium 4.0, BUN 10, serum creatinine 0.83, albumin 4.2, AST/ALT normal 12/2023 - TC 199, TG 134, HDL 60, LDL 111, TSH normal, Hgb 13.2, PLT 238, A1c 5.8  Past Medical History:  Diagnosis Date   Asthma    Breast cancer (HCC) 2005   Cancer (HCC)    Breast- Right-2005; bilateral-2021   Cardiomyopathy (HCC) 07/14/2020   LVEF 43.7% by cardiac MRI; LVEF 40-45% with goal hypokinesis on repeat cardiac MRI 09/04/2022 (research protocol through [place])   Hyperlipidemia    Diet controlled   Osteopenia    Personal history of chemotherapy    Personal history of radiation therapy    PONV (postoperative nausea and vomiting)     Past Surgical History:  Procedure Laterality Date  BREAST BIOPSY Right 2005   +   BREAST BIOPSY Right 03/01/2020   us  bx of mass at 2:00 heart marker,  INVASIVE MAMMARY CARCINOMA, NO   BREAST BIOPSY Left 03/01/2020   us  bx mass at 3:00, coil marker,  INVASIVE MAMMARY CARCINOMA   BREAST BIOPSY Right 03/01/2020   us  bx of right axilla, no marker placed,BENIGN LYMPH NODE.   BREAST LUMPECTOMY Right 2005   BREAST LUMPECTOMY WITH NEEDLE LOCALIZATION AND  AXILLARY SENTINEL LYMPH NODE BX Right 2005   BREAST LUMPECTOMY WITH RADIOACTIVE SEED AND SENTINEL LYMPH NODE BIOPSY Bilateral 04/14/2020   Procedure: BILATERAL RADIOACTIVE SEED GUIDED BREAST LUMPECTOMIES, BILATERAL AXILLARY SENTINEL LYMPH NODE BIOPSIES, WITH BLUE DYE INJECTION RIGHT BREAST;  Surgeon: Ethyl Lenis, MD;  Location: [facility] OR;  Service: General;  Laterality: Bilateral;   BREAST SURGERY     COLONOSCOPY WITH PROPOFOL  N/A 04/02/2017   Procedure: COLONOSCOPY WITH PROPOFOL ;  Surgeon: Therisa Bi, MD;  Location: [facility] ENDOSCOPY;  Service: Endoscopy;  Laterality: N/A;   COLONOSCOPY WITH PROPOFOL  N/A 08/29/2022   Procedure: COLONOSCOPY WITH PROPOFOL ;  Surgeon: Toledo, Ladell POUR, MD;  Location: [facility] ENDOSCOPY;  Service: Gastroenterology;  Laterality: N/A;   ESOPHAGOGASTRODUODENOSCOPY (EGD) WITH PROPOFOL  N/A 04/02/2017   Procedure: ESOPHAGOGASTRODUODENOSCOPY (EGD) WITH PROPOFOL ;  Surgeon: Therisa Bi, MD;  Location: [facility] ENDOSCOPY;  Service: Endoscopy;  Laterality: N/A;   TONSILLECTOMY  ~2000    Current Medications: Active Medications[1]  Allergies:   Celebrex [celecoxib], Clindamycin hcl, Loratadine, Omeprazole, Penicillins, and Sulfa antibiotics   Social History   Socioeconomic History   Marital status: Married    Spouse name: Not on file   Number of children: Not on file   Years of education: Not on file   Highest education level: Not on file  Occupational History   Not on file  Tobacco Use   Smoking status: Never   Smokeless tobacco: Never  Vaping Use   Vaping status: Never Used  Substance and Sexual Activity   Alcohol use: No   Drug use: No   Sexual activity: Not Currently  Other Topics Concern   Not on file  Social History Narrative   Not on file   Social Drivers of Health   Tobacco Use: Low Risk (11/06/2024)   Patient History    Smoking Tobacco Use: Never    Smokeless Tobacco Use: Never    Passive Exposure: Not on file  Financial Resource Strain: Low Risk   (01/17/2024)   Received from [facility]   Overall Financial Resource Strain (CARDIA)    Difficulty of Paying Living Expenses: Not hard at all  Food Insecurity: No Food Insecurity (01/17/2024)   Received from [facility]   Epic    Within the past 12 months, you worried that your food would run out before you got the money to buy more.: Never true    Within the past 12 months, the food you bought just didn't last and you didn't have money to get more.: Never true  Transportation Needs: No Transportation Needs (01/17/2024)   Received from [facility] - Transportation    In the past 12 months, has lack of transportation kept you from medical appointments or from getting medications?: No    Lack of Transportation (Non-Medical): No  Physical Activity: Not on file  Stress: Not on file  Social Connections: Not on file  Depression (PHQ2-9): Low Risk (07/03/2024)   Depression (PHQ2-9)    PHQ-2 Score: 0  Alcohol Screen:  Not on file  Housing: Unknown (01/17/2024)   Received from [facility]   Epic    In the last 12 months, was there a time when you were not able to pay the mortgage or rent on time?: No    Number of Times Moved in the Last Year: Not on file    At any time in the past 12 months, were you homeless or living in a shelter (including now)?: No  Utilities: Not At Risk (01/17/2024)   Received from [facility] Utilities    Threatened with loss of utilities: No  Health Literacy: Not on file     Family History:  The patient's family history includes Atrial fibrillation in her mother; Breast cancer (age of onset: 9) in her mother; Cancer (age of onset: 5) in her mother; Heart failure in her father; Hepatitis in her father; Hypertension in her father, mother, and sister; Leukemia in her mother.  ROS:   12-point review of systems is negative unless otherwise noted in the  HPI.   EKGs/Labs/Other Studies Reviewed:    Studies reviewed were summarized above. The additional studies were reviewed today:  Calcium score 01/30/2024: Ascending Aorta: Normal size   Pericardium: Normal   Coronary arteries: Normal origin of left and right coronary arteries. Distribution of arterial calcifications if present, as noted below;   LM 0   LAD 0   LCx 0   RCA 0   Total 0   IMPRESSION AND RECOMMENDATION: 1. Coronary calcium score of 0.   2. CAC 0, CAC-DRS A0.   3. Continue heart healthy lifestyle and risk factor modification. __________  2D echo 12/28/2022: 1. Left ventricular ejection fraction, by estimation, is 50 to 55%. The  left ventricle has low normal function. The left ventricle has no regional  wall motion abnormalities. Left ventricular diastolic parameters are  consistent with Grade I diastolic  dysfunction (impaired relaxation).   2. Right ventricular systolic function is normal. The right ventricular  size is normal. Tricuspid regurgitation signal is inadequate for assessing  PA pressure.   3. The mitral valve is normal in structure. Mild mitral valve  regurgitation. No evidence of mitral stenosis.   4. The aortic valve is tricuspid. Aortic valve regurgitation is not  visualized. No aortic stenosis is present.   5. The inferior vena cava is normal in size with greater than 50%  respiratory variability, suggesting right atrial pressure of 3 mmHg.    EKG:  EKG is ordered today.  The EKG ordered today demonstrates NSR with sinus arrhythmia, 73 bpm, baseline artifact, no acute ST-T changes  Recent Labs: 07/17/2024: ALT 17; BUN 10; Creatinine 0.83; Potassium 4.0; Sodium 137  Recent Lipid Panel No results found for: CHOL, TRIG, HDL, CHOLHDL, VLDL, LDLCALC, LDLDIRECT  PHYSICAL EXAM:    VS:  BP 110/82 (BP Location: Right Arm, Patient Position: Sitting, Cuff Size: Normal)   Pulse 73   Ht 5' 6 (1.676 m)   Wt 196 lb (88.9 kg)    LMP  (LMP Unknown)   SpO2 97%   BMI 31.64 kg/m   BMI: Body mass index is 31.64 kg/m.  Physical Exam Vitals reviewed.  Constitutional:      Appearance: She is well-developed.  HENT:     Head: Normocephalic and atraumatic.  Eyes:     General:        Right eye: No discharge.        Left eye: No discharge.  Cardiovascular:     Rate and Rhythm: Normal rate and regular rhythm.     Heart sounds: Normal heart sounds, S1 normal and S2 normal. Heart sounds not distant. No midsystolic click and no opening snap. No murmur heard.    No friction rub.  Pulmonary:     Effort: Pulmonary effort is normal. No respiratory distress.     Breath sounds: Normal breath sounds. No decreased breath sounds, wheezing, rhonchi or rales.  Musculoskeletal:     Cervical back: Normal range of motion.     Right lower leg: No edema.     Left lower leg: No edema.  Skin:    General: Skin is warm and dry.     Nails: There is no clubbing.  Neurological:     Mental Status: She is alert and oriented to person, place, and time.  Psychiatric:        Speech: Speech normal.        Behavior: Behavior normal.        Thought Content: Thought content normal.        Judgment: Judgment normal.     Wt Readings from Last 3 Encounters:  12/02/24 196 lb (88.9 kg)  11/06/24 202 lb 13.2 oz (92 kg)  07/03/24 202 lb 14.4 oz (92 kg)     ASSESSMENT & PLAN:   HFimpEF: Euvolemic and well compensated with NYHA class I symptoms.  Most recent echo showed low normal LV systolic function.  She remains on carvedilol  3.125 mg twice daily.  Not requiring loop diuretic.  No symptoms of ischemic heart disease.  Given normalization of LV systolic function, and in the context of lack of heart failure symptoms, defer further escalation of evidence-based pharmacotherapy at this time.  No indication for further testing currently.  Mitral regurgitation: Mild by echo in 12/2022.  Anticipate echo prior to next visit.  HLD: LDL 111 in 12/2023.   Calcium score in 01/2024 of 0.  Not currently requiring statin therapy.  Will require continued monitoring while on anastrozole .  Continue with heart healthy diet and regular exercise.     Disposition: F/u with Dr. Mady or an APP in 12 months.   Medication Adjustments/Labs and Tests Ordered: Current medicines are reviewed at length with the patient today.  Concerns regarding medicines are outlined above. Medication changes, Labs and Tests ordered today are summarized above and listed in the Patient Instructions accessible in Encounters.   Signed, Bernardino Bring, PA-C 12/02/2024 4:34 PM     [facility] -  [address] [place], [place] [address] [phone]      [1]  Current Meds  Medication Sig   acetaminophen  (TYLENOL ) 500 MG tablet Take 500-1,000 mg by mouth 2 (two) times daily as needed (pain.).   anastrozole  (ARIMIDEX ) 1 MG tablet Take 1 tablet (1 mg total) by mouth daily.   calcium carbonate (OSCAL) 1500 (600 Ca) MG TABS tablet Take 600 mg of elemental calcium by mouth 2 (two) times daily with a meal.   carvedilol  (COREG ) 3.125 MG tablet Take 1 tablet (3.125 mg total) by mouth 2 (two) times daily.   cetirizine (ZYRTEC) 10 MG tablet Take 10 mg by mouth daily as needed.   Cholecalciferol (VITAMIN D-1000 MAX ST) 25 MCG (1000 UT) tablet Take 1,000 Units by mouth daily.   estradiol  (ESTRACE ) 0.1 MG/GM vaginal cream Place 1 Application vaginally 2 (two) times a week. Apply a pea-sized amount to fingertip or Q-tip and wipe in vaginal roof twice  weekly.   "

## 2024-12-22 ENCOUNTER — Ambulatory Visit: Admitting: Physician Assistant

## 2024-12-30 ENCOUNTER — Telehealth: Payer: Self-pay | Admitting: Oncology

## 2024-12-30 NOTE — Telephone Encounter (Signed)
 Pt had lvm to r/s her appt from 2/9 to a different date due to taking care of her spouse who is having surgery.   I called pt back and confirmed new date/time for MD appt.

## 2025-01-04 ENCOUNTER — Ambulatory Visit: Admitting: Oncology

## 2025-02-24 ENCOUNTER — Inpatient Hospital Stay: Admitting: Oncology

## 2025-12-02 ENCOUNTER — Ambulatory Visit
